# Patient Record
Sex: Male | Born: 1965
Health system: Southern US, Community
[De-identification: ages and names within clinical notes are randomized; demographics above are authoritative.]

## PROBLEM LIST (undated history)

## (undated) DIAGNOSIS — I509 Heart failure, unspecified: Secondary | ICD-10-CM

## (undated) DIAGNOSIS — G4730 Sleep apnea, unspecified: Secondary | ICD-10-CM

## (undated) DIAGNOSIS — Z9911 Dependence on respirator [ventilator] status: Secondary | ICD-10-CM

## (undated) DIAGNOSIS — E119 Type 2 diabetes mellitus without complications: Secondary | ICD-10-CM

## (undated) DIAGNOSIS — J449 Chronic obstructive pulmonary disease, unspecified: Secondary | ICD-10-CM

## (undated) DIAGNOSIS — I10 Essential (primary) hypertension: Secondary | ICD-10-CM

## (undated) DIAGNOSIS — L98429 Non-pressure chronic ulcer of back with unspecified severity: Secondary | ICD-10-CM

## (undated) HISTORY — DX: Type 2 diabetes mellitus without complications: E11.9

## (undated) HISTORY — DX: Chronic obstructive pulmonary disease, unspecified: J44.9

## (undated) HISTORY — PX: TRACHEOSTOMY TUBE PLACEMENT: SHX814

## (undated) HISTORY — DX: Non-pressure chronic ulcer of back with unspecified severity: L98.429

---

## 2004-03-29 ENCOUNTER — Emergency Department (HOSPITAL_COMMUNITY): Admission: EM | Admit: 2004-03-29 | Discharge: 2004-03-29 | Payer: Self-pay | Admitting: Emergency Medicine

## 2004-06-22 ENCOUNTER — Inpatient Hospital Stay (HOSPITAL_COMMUNITY): Admission: RE | Admit: 2004-06-22 | Discharge: 2004-06-27 | Payer: Self-pay | Admitting: Psychiatry

## 2004-06-22 ENCOUNTER — Ambulatory Visit: Payer: Self-pay | Admitting: Psychiatry

## 2005-02-08 ENCOUNTER — Emergency Department (HOSPITAL_COMMUNITY): Admission: EM | Admit: 2005-02-08 | Discharge: 2005-02-08 | Payer: Self-pay | Admitting: Emergency Medicine

## 2006-05-13 ENCOUNTER — Emergency Department (HOSPITAL_COMMUNITY): Admission: EM | Admit: 2006-05-13 | Discharge: 2006-05-14 | Payer: Self-pay | Admitting: Emergency Medicine

## 2006-08-29 ENCOUNTER — Emergency Department (HOSPITAL_COMMUNITY): Admission: EM | Admit: 2006-08-29 | Discharge: 2006-08-29 | Payer: Self-pay | Admitting: Emergency Medicine

## 2006-09-17 ENCOUNTER — Emergency Department (HOSPITAL_COMMUNITY): Admission: EM | Admit: 2006-09-17 | Discharge: 2006-09-17 | Payer: Self-pay | Admitting: Emergency Medicine

## 2006-12-06 ENCOUNTER — Emergency Department (HOSPITAL_COMMUNITY): Admission: EM | Admit: 2006-12-06 | Discharge: 2006-12-06 | Payer: Self-pay | Admitting: Emergency Medicine

## 2006-12-09 ENCOUNTER — Emergency Department (HOSPITAL_COMMUNITY): Admission: EM | Admit: 2006-12-09 | Discharge: 2006-12-09 | Payer: Self-pay | Admitting: Emergency Medicine

## 2007-01-02 ENCOUNTER — Emergency Department (HOSPITAL_COMMUNITY): Admission: EM | Admit: 2007-01-02 | Discharge: 2007-01-02 | Payer: Self-pay | Admitting: Emergency Medicine

## 2007-08-11 ENCOUNTER — Ambulatory Visit: Payer: Self-pay | Admitting: Cardiovascular Disease

## 2007-08-11 ENCOUNTER — Inpatient Hospital Stay (HOSPITAL_COMMUNITY): Admission: EM | Admit: 2007-08-11 | Discharge: 2007-08-15 | Payer: Self-pay | Admitting: Emergency Medicine

## 2007-08-12 ENCOUNTER — Encounter (INDEPENDENT_AMBULATORY_CARE_PROVIDER_SITE_OTHER): Payer: Self-pay | Admitting: Internal Medicine

## 2007-08-13 ENCOUNTER — Ambulatory Visit: Payer: Self-pay | Admitting: Surgery

## 2007-08-13 ENCOUNTER — Encounter (INDEPENDENT_AMBULATORY_CARE_PROVIDER_SITE_OTHER): Payer: Self-pay | Admitting: Internal Medicine

## 2007-09-19 ENCOUNTER — Inpatient Hospital Stay (HOSPITAL_COMMUNITY): Admission: EM | Admit: 2007-09-19 | Discharge: 2007-09-21 | Payer: Self-pay | Admitting: Emergency Medicine

## 2007-09-30 ENCOUNTER — Ambulatory Visit: Payer: Self-pay | Admitting: Internal Medicine

## 2007-10-19 ENCOUNTER — Ambulatory Visit: Payer: Self-pay | Admitting: Internal Medicine

## 2007-11-09 ENCOUNTER — Emergency Department (HOSPITAL_COMMUNITY): Admission: EM | Admit: 2007-11-09 | Discharge: 2007-11-09 | Payer: Self-pay | Admitting: Emergency Medicine

## 2007-12-01 ENCOUNTER — Emergency Department (HOSPITAL_COMMUNITY): Admission: EM | Admit: 2007-12-01 | Discharge: 2007-12-01 | Payer: Self-pay | Admitting: Emergency Medicine

## 2008-01-28 ENCOUNTER — Emergency Department (HOSPITAL_COMMUNITY): Admission: EM | Admit: 2008-01-28 | Discharge: 2008-01-28 | Payer: Self-pay | Admitting: Emergency Medicine

## 2008-07-18 ENCOUNTER — Emergency Department (HOSPITAL_COMMUNITY): Admission: EM | Admit: 2008-07-18 | Discharge: 2008-07-18 | Payer: Self-pay | Admitting: Emergency Medicine

## 2008-08-03 ENCOUNTER — Emergency Department (HOSPITAL_COMMUNITY): Admission: EM | Admit: 2008-08-03 | Discharge: 2008-08-03 | Payer: Self-pay | Admitting: Emergency Medicine

## 2008-11-17 ENCOUNTER — Emergency Department (HOSPITAL_COMMUNITY): Admission: EM | Admit: 2008-11-17 | Discharge: 2008-11-17 | Payer: Self-pay | Admitting: Family Medicine

## 2009-12-22 ENCOUNTER — Emergency Department (HOSPITAL_COMMUNITY): Admission: EM | Admit: 2009-12-22 | Discharge: 2009-12-22 | Payer: Self-pay | Admitting: Emergency Medicine

## 2010-01-08 ENCOUNTER — Inpatient Hospital Stay (HOSPITAL_COMMUNITY)
Admission: EM | Admit: 2010-01-08 | Discharge: 2010-01-12 | Payer: Self-pay | Source: Home / Self Care | Attending: Internal Medicine | Admitting: Internal Medicine

## 2010-01-08 ENCOUNTER — Emergency Department (HOSPITAL_COMMUNITY)
Admission: EM | Admit: 2010-01-08 | Discharge: 2010-01-08 | Disposition: A | Discharge status: Other Acute Inpatient Hospital | Payer: Self-pay | Source: Home / Self Care | Admitting: Emergency Medicine

## 2010-01-09 ENCOUNTER — Encounter (INDEPENDENT_AMBULATORY_CARE_PROVIDER_SITE_OTHER): Payer: Self-pay | Admitting: Internal Medicine

## 2010-01-22 ENCOUNTER — Ambulatory Visit (HOSPITAL_BASED_OUTPATIENT_CLINIC_OR_DEPARTMENT_OTHER)
Admission: RE | Admit: 2010-01-22 | Discharge: 2010-01-22 | Payer: Self-pay | Source: Home / Self Care | Attending: Internal Medicine | Admitting: Internal Medicine

## 2010-01-29 ENCOUNTER — Emergency Department (HOSPITAL_COMMUNITY)
Admission: EM | Admit: 2010-01-29 | Discharge: 2010-01-30 | Payer: Self-pay | Source: Home / Self Care | Admitting: Emergency Medicine

## 2010-02-02 ENCOUNTER — Observation Stay (HOSPITAL_COMMUNITY)
Admission: EM | Admit: 2010-02-02 | Discharge: 2010-02-05 | Payer: Self-pay | Source: Home / Self Care | Attending: Internal Medicine | Admitting: Internal Medicine

## 2010-02-04 DIAGNOSIS — L98429 Non-pressure chronic ulcer of back with unspecified severity: Secondary | ICD-10-CM

## 2010-02-04 HISTORY — DX: Non-pressure chronic ulcer of back with unspecified severity: L98.429

## 2010-02-28 NOTE — Discharge Summary (Addendum)
[name], [name] NO.:  [id number]  MEDICAL RECORD NO.:  [id number]          PATIENT TYPE:  OBV  LOCATION:  5511                         FACILITY:  [facility]  PHYSICIAN:  Rock Nephew, MD       DATE OF BIRTH:  [birth year]  DATE OF ADMISSION:  02/02/2010 DATE OF DISCHARGE:                        DISCHARGE SUMMARY - REFERRING   PATIENT PRIMARY CARE PHYSICIAN:  He does not have one.  The patient should establish care with [facility] or establish a primary care physician.  DISCHARGE DIAGNOSES:     Shortness of breath, multifactorial possible chronic obstructive     pulmonary disease, sleep apnea, obesity, hypoventilation syndrome,     mild volume overload.     Sleep apnea/obesity, hypoventilation syndrome on C Pap, morbid     obesity, counselled, leukocytosis secondary to steroids, tobacco     abuse counseled, obesity, counseled.  DISCHARGE MEDICATIONS:  Discharge medications for the patient are as follows: 1. Combivent 2 puffs inhaled as directed. 2. Avelox 400 mg p.o. daily for 8 days. 3. Prednisone 1 taper 60 mg for 2 days, 50 mg for 2 days, 40 mg for 2     days, 30 mg for 2 days and 20 mg for 2 days, 10 mg for 2 days. 4. Ibuprofen 4 tablets q.4h. as needed. 5. Lasix 20 mg p.o. daily.  DISCHARGE INSTRUCTIONS:  The patient's diet should be heart-healthy.  PROCEDURES PERFORMED:  The patient had a chest x-ray which showed prominent lung markings that appeared chronic.  Cannot exclude mild edema or vascular congestion.  CONSULTATIONS ON THIS CASE:  None.  FOLLOWUP:  The patient should follow-up at [facility] or with a PCP within 1 week.  The patient should follow up with Dr. Joni Fears D. Young FCCP pulmonologist in 1-2 weeks.  HISTORY AND PHYSICAL:  Chief complaint shortness of breath.  The patient is a 45 year old male, homeless, history of significant obesity, sleep apnea/obesity, hypoventilation syndrome, smoker.  He came to the hospital for  shortness of breath.  He reports that he has had shortness of breast for the last 2 days and it is getting worse.  The patient also does not have any inhalers at home because he is homeless.  HOSPITAL COURSE: 1. Shortness of breath.  The patient had some shortness of breath.     The shortness of breath was multifactorial, combination of obesity     hypoventilation syndrome, sleep apnea, and possible COPD.  The     patient was given some IV Lasix.  The patient was given inhalers     and some steroids.  The patient improved.  The patient was deemed     ready for discharge on February 05, 2010. 2. Sleep apnea and obesity hypoventilation syndrome.  The patient was     counseled to lose weight.  The patient uses a C Pap at home.  He     has a C Pap that he takes to the shelter with him. 3. Morbid obesity.  The patient has a history of morbid obesity and     the patient was counseled on weight loss.  4. Leukocytosis.  The patient was given steroids during admission and     the patient developed some leukocytosis that has resolved.  The     patient also is being empirically treated with Avelox. 5. Tobacco abuse.  The patient was counseled.  We will also obtain     case management and social work consults for this patient in hopes     of easing his transition out of the hospital.     Rock Nephew, MD     NH/MEDQ  D:  02/05/2010  T:  02/05/2010  Job:  [id number]  Electronically Signed by Rock Nephew MD on 02/28/2010 06:15:18 PM

## 2010-03-04 ENCOUNTER — Inpatient Hospital Stay (HOSPITAL_COMMUNITY)
Admission: EM | Admit: 2010-03-04 | Discharge: 2010-03-16 | DRG: 291 | Disposition: A | Discharge status: Skilled Nursing Facility | Payer: Medicaid Other | Attending: Internal Medicine | Admitting: Internal Medicine

## 2010-03-04 DIAGNOSIS — E872 Acidosis, unspecified: Secondary | ICD-10-CM | POA: Diagnosis present

## 2010-03-04 DIAGNOSIS — Z6841 Body Mass Index (BMI) 40.0 and over, adult: Secondary | ICD-10-CM

## 2010-03-04 DIAGNOSIS — E785 Hyperlipidemia, unspecified: Secondary | ICD-10-CM | POA: Diagnosis present

## 2010-03-04 DIAGNOSIS — Z5900 Homelessness unspecified: Secondary | ICD-10-CM

## 2010-03-04 DIAGNOSIS — M549 Dorsalgia, unspecified: Secondary | ICD-10-CM | POA: Diagnosis present

## 2010-03-04 DIAGNOSIS — F17200 Nicotine dependence, unspecified, uncomplicated: Secondary | ICD-10-CM | POA: Diagnosis present

## 2010-03-04 DIAGNOSIS — D72829 Elevated white blood cell count, unspecified: Secondary | ICD-10-CM | POA: Diagnosis present

## 2010-03-04 DIAGNOSIS — J9600 Acute respiratory failure, unspecified whether with hypoxia or hypercapnia: Secondary | ICD-10-CM | POA: Diagnosis present

## 2010-03-04 DIAGNOSIS — G4733 Obstructive sleep apnea (adult) (pediatric): Secondary | ICD-10-CM | POA: Diagnosis present

## 2010-03-04 DIAGNOSIS — I5033 Acute on chronic diastolic (congestive) heart failure: Principal | ICD-10-CM | POA: Diagnosis present

## 2010-03-04 DIAGNOSIS — I10 Essential (primary) hypertension: Secondary | ICD-10-CM | POA: Diagnosis present

## 2010-03-04 LAB — DIFFERENTIAL
Basophils Absolute: 0 10*3/uL (ref 0.0–0.1)
Lymphocytes Relative: 20 % (ref 12–46)
Lymphs Abs: 3.7 10*3/uL (ref 0.7–4.0)
Monocytes Absolute: 1.1 10*3/uL — ABNORMAL HIGH (ref 0.1–1.0)
Monocytes Relative: 6 % (ref 3–12)
Neutro Abs: 13.8 10*3/uL — ABNORMAL HIGH (ref 1.7–7.7)

## 2010-03-04 LAB — CBC
HCT: 37.3 % — ABNORMAL LOW (ref 39.0–52.0)
Hemoglobin: 13 g/dL (ref 13.0–17.0)
MCH: 26.4 pg (ref 26.0–34.0)
MCHC: 34.9 g/dL (ref 30.0–36.0)
MCV: 75.8 fL — ABNORMAL LOW (ref 78.0–100.0)

## 2010-03-05 LAB — BASIC METABOLIC PANEL
CO2: 30 mEq/L (ref 19–32)
Calcium: 9 mg/dL (ref 8.4–10.5)
Glucose, Bld: 164 mg/dL — ABNORMAL HIGH (ref 70–99)
Sodium: 140 mEq/L (ref 135–145)

## 2010-03-05 LAB — POCT I-STAT 3, ART BLOOD GAS (G3+)
O2 Saturation: 97 %
TCO2: 37 mmol/L (ref 0–100)
pCO2 arterial: 66.9 mmHg (ref 35.0–45.0)
pO2, Arterial: 104 mmHg — ABNORMAL HIGH (ref 80.0–100.0)

## 2010-03-05 LAB — CK TOTAL AND CKMB (NOT AT ARMC)
CK, MB: 2.5 ng/mL (ref 0.3–4.0)
Relative Index: 0.7 (ref 0.0–2.5)
Total CK: 359 U/L — ABNORMAL HIGH (ref 7–232)

## 2010-03-05 LAB — TROPONIN I: Troponin I: <0.01 ng/mL (ref 0.00–0.06)

## 2010-03-05 LAB — CARDIAC PANEL(CRET KIN+CKTOT+MB+TROPI)
CK, MB: 2.4 ng/mL (ref 0.3–4.0)
Relative Index: 1.2 (ref 0.0–2.5)
Troponin I: <0.01 ng/mL (ref 0.00–0.06)
Troponin I: 0.01 ng/mL (ref 0.00–0.06)

## 2010-03-05 LAB — BLOOD GAS, ARTERIAL
Drawn by: 23337
O2 Content: 6 L/min
pCO2 arterial: 65.5 mmHg (ref 35.0–45.0)
pO2, Arterial: 89.7 mmHg (ref 80.0–100.0)

## 2010-03-05 LAB — POCT I-STAT, CHEM 8
Chloride: 99 mEq/L (ref 96–112)
Creatinine, Ser: 1.1 mg/dL (ref 0.4–1.5)
Glucose, Bld: 197 mg/dL — ABNORMAL HIGH (ref 70–99)
Hemoglobin: 15 g/dL (ref 13.0–17.0)
Potassium: 4.4 mEq/L (ref 3.5–5.1)

## 2010-03-05 LAB — TSH: TSH: 0.802 u[IU]/mL (ref 0.350–4.500)

## 2010-03-05 LAB — LIPID PANEL: Cholesterol: 183 mg/dL (ref 0–200)

## 2010-03-06 LAB — CBC
HCT: 36.9 % — ABNORMAL LOW (ref 39.0–52.0)
Hemoglobin: 12.1 g/dL — ABNORMAL LOW (ref 13.0–17.0)
RDW: 15.4 % (ref 11.5–15.5)
WBC: 17.4 10*3/uL — ABNORMAL HIGH (ref 4.0–10.5)

## 2010-03-06 LAB — BASIC METABOLIC PANEL
GFR calc Af Amer: >60 mL/min (ref >60)
GFR calc non Af Amer: >60 mL/min (ref >60)
Glucose, Bld: 127 mg/dL — ABNORMAL HIGH (ref 70–99)
Potassium: 4.2 mEq/L (ref 3.5–5.1)
Sodium: 137 mEq/L (ref 135–145)

## 2010-03-06 LAB — BRAIN NATRIURETIC PEPTIDE: Pro B Natriuretic peptide (BNP): <30 pg/mL (ref 0.0–100.0)

## 2010-03-07 ENCOUNTER — Inpatient Hospital Stay (HOSPITAL_COMMUNITY): Payer: Medicaid Other

## 2010-03-07 LAB — RENAL FUNCTION PANEL
Albumin: 3.2 g/dL — ABNORMAL LOW (ref 3.5–5.2)
CO2: 39 mEq/L — ABNORMAL HIGH (ref 19–32)
Chloride: 92 mEq/L — ABNORMAL LOW (ref 96–112)
GFR calc Af Amer: >60 mL/min (ref >60)
GFR calc non Af Amer: >60 mL/min (ref >60)
Potassium: 4.1 mEq/L (ref 3.5–5.1)
Sodium: 139 mEq/L (ref 135–145)

## 2010-03-07 LAB — CBC
Hemoglobin: 13.4 g/dL (ref 13.0–17.0)
MCH: 26.1 pg (ref 26.0–34.0)
Platelets: 283 10*3/uL (ref 150–400)
RBC: 5.13 MIL/uL (ref 4.22–5.81)
WBC: 16.7 10*3/uL — ABNORMAL HIGH (ref 4.0–10.5)

## 2010-03-08 LAB — CBC
HCT: 38.9 % — ABNORMAL LOW (ref 39.0–52.0)
Hemoglobin: 13.4 g/dL (ref 13.0–17.0)
RBC: 5.07 MIL/uL (ref 4.22–5.81)
WBC: 17.2 10*3/uL — ABNORMAL HIGH (ref 4.0–10.5)

## 2010-03-08 LAB — BASIC METABOLIC PANEL
CO2: 34 mEq/L — ABNORMAL HIGH (ref 19–32)
Calcium: 9 mg/dL (ref 8.4–10.5)
GFR calc Af Amer: >60 mL/min (ref >60)
Glucose, Bld: 124 mg/dL — ABNORMAL HIGH (ref 70–99)
Potassium: 3.5 mEq/L (ref 3.5–5.1)
Sodium: 136 mEq/L (ref 135–145)

## 2010-03-09 LAB — BASIC METABOLIC PANEL
BUN: 13 mg/dL (ref 6–23)
GFR calc non Af Amer: >60 mL/min (ref >60)
Glucose, Bld: 149 mg/dL — ABNORMAL HIGH (ref 70–99)
Potassium: 4.1 mEq/L (ref 3.5–5.1)

## 2010-03-09 LAB — CBC
HCT: 40.7 % (ref 39.0–52.0)
Hemoglobin: 14.1 g/dL (ref 13.0–17.0)
MCH: 26.4 pg (ref 26.0–34.0)
MCHC: 34.6 g/dL (ref 30.0–36.0)
RBC: 5.34 MIL/uL (ref 4.22–5.81)

## 2010-03-10 LAB — CBC
HCT: 39.3 % (ref 39.0–52.0)
MCHC: 34.6 g/dL (ref 30.0–36.0)
RDW: 15 % (ref 11.5–15.5)

## 2010-03-10 LAB — BASIC METABOLIC PANEL
BUN: 14 mg/dL (ref 6–23)
Calcium: 9.2 mg/dL (ref 8.4–10.5)
GFR calc non Af Amer: >60 mL/min (ref >60)
Glucose, Bld: 126 mg/dL — ABNORMAL HIGH (ref 70–99)
Sodium: 135 mEq/L (ref 135–145)

## 2010-03-11 LAB — BASIC METABOLIC PANEL
Calcium: 9.4 mg/dL (ref 8.4–10.5)
GFR calc Af Amer: >60 mL/min (ref >60)
GFR calc non Af Amer: >60 mL/min (ref >60)
Sodium: 136 mEq/L (ref 135–145)

## 2010-03-11 LAB — CBC
Hemoglobin: 14.6 g/dL (ref 13.0–17.0)
MCHC: 35.2 g/dL (ref 30.0–36.0)
Platelets: 300 10*3/uL (ref 150–400)
RDW: 15.1 % (ref 11.5–15.5)

## 2010-03-12 LAB — BASIC METABOLIC PANEL
GFR calc non Af Amer: >60 mL/min (ref >60)
Potassium: 4.2 mEq/L (ref 3.5–5.1)
Sodium: 136 mEq/L (ref 135–145)

## 2010-03-13 LAB — CBC
HCT: 38.4 % — ABNORMAL LOW (ref 39.0–52.0)
Hemoglobin: 13.8 g/dL (ref 13.0–17.0)
RDW: 14.9 % (ref 11.5–15.5)
WBC: 16.4 10*3/uL — ABNORMAL HIGH (ref 4.0–10.5)

## 2010-03-14 ENCOUNTER — Inpatient Hospital Stay (HOSPITAL_COMMUNITY): Payer: Medicaid Other

## 2010-03-14 LAB — BASIC METABOLIC PANEL
CO2: 34 mEq/L — ABNORMAL HIGH (ref 19–32)
GFR calc Af Amer: >60 mL/min (ref >60)
GFR calc non Af Amer: >60 mL/min (ref >60)
Glucose, Bld: 167 mg/dL — ABNORMAL HIGH (ref 70–99)
Potassium: 4.1 mEq/L (ref 3.5–5.1)
Sodium: 138 mEq/L (ref 135–145)

## 2010-03-14 LAB — PHOSPHORUS: Phosphorus: 3.7 mg/dL (ref 2.3–4.6)

## 2010-03-16 LAB — COMPREHENSIVE METABOLIC PANEL
ALT: 33 U/L (ref 0–53)
AST: 22 U/L (ref 0–37)
Albumin: 3.3 g/dL — ABNORMAL LOW (ref 3.5–5.2)
Alkaline Phosphatase: 58 U/L (ref 39–117)
CO2: 31 mEq/L (ref 19–32)
Chloride: 98 mEq/L (ref 96–112)
Creatinine, Ser: 0.8 mg/dL (ref 0.4–1.5)
GFR calc Af Amer: >60 mL/min (ref >60)
GFR calc non Af Amer: >60 mL/min (ref >60)
Potassium: 4.2 mEq/L (ref 3.5–5.1)
Sodium: 137 mEq/L (ref 135–145)
Total Bilirubin: 0.4 mg/dL (ref 0.3–1.2)

## 2010-03-16 LAB — CBC
Hemoglobin: 13.5 g/dL (ref 13.0–17.0)
MCH: 26.1 pg (ref 26.0–34.0)
Platelets: 295 10*3/uL (ref 150–400)
RBC: 5.18 MIL/uL (ref 4.22–5.81)
WBC: 14.9 10*3/uL — ABNORMAL HIGH (ref 4.0–10.5)

## 2010-03-16 LAB — DIFFERENTIAL
Basophils Relative: 0 % (ref 0–1)
Monocytes Relative: 10 % (ref 3–12)
Neutro Abs: 8.1 10*3/uL — ABNORMAL HIGH (ref 1.7–7.7)
Neutrophils Relative %: 54 % (ref 43–77)

## 2010-03-17 NOTE — Discharge Summary (Signed)
[name] NO.:  [id number]  MEDICAL RECORD NO.:  [id number]           PATIENT TYPE:  LOCATION:                                 FACILITY:  PHYSICIAN:  [name] Nephew, MD       DATE OF BIRTH:  1965-09-11  DATE OF ADMISSION:  03/05/2010 DATE OF DISCHARGE:  03/16/2010                              DISCHARGE SUMMARY   PRIMARY CARE PHYSICIAN:  The patient does not have a primary care physician.  DISCHARGE DIAGNOSES: 1. Respiratory distress. 2. Dyspnea, multifactorial. 3. Chronic obstructive pulmonary disease. 4. Obstructive sleep apnea. 5. Possible obesity hypoventilation syndrome. 6. Treated hypertension. 7. Chronic leukocytosis. 8. Severe chronic obstructive pulmonary disease for PFTs. 9. Morbid obesity. 10.History of hypertension. 11.History of dyslipidemia. 12.Chronic back pain. 13.History of diastolic congestive heart failure.  DISCHARGE MEDICATIONS: 1. Albuterol 2.5 mg inhaled every 6 hours as needed. 2. Amlodipine 5 mg p.o. daily. 3. Colace 100 mg by mouth twice daily. 4. Advair 250/50 1 one puff inhaled twice daily. 5. Furosemide 40 mg p.o. daily. 6. Lisinopril 10 mg p.o. daily. 7. Oxycodone 5 mg by mouth every 4 hours as needed. 8. Senna 2 tablets by mouth daily as needed. 9. Simvastatin 20 mg p.o. daily. 10.Spiriva 18 mcg inhaled daily.  DISPOSITION:  SNF.  PROCEDURES PERFORMED:  The patient had PFTs on February 05, 2010, which showed severe COPD.  The patient also had chest x-ray last performed on March 14, 2010, which showed chronic cardiomegaly, pulmonary vascularities within normal limits.  No infiltrates or effusions.  No acute osseous abnormality.  CONSULTATIONS ON THIS CASE:  None.  DIET:  It should be heart healthy, low fat, low calorie.  FOLLOWUP:  The patient should follow up with SNF PCP within 3-4 days. The patient should also follow up with either Dr. Jetty Duhamel or a pulmonologist within 2-3 weeks.  BRIEF  HISTORY OF PRESENT ILLNESS:  This is a 45 year old male with morbid obesity, multiple medical problems, and homeless who presented to the emergency department complaining of shortness of breath over the last 24 hours prior to admission.  The patient had several recent hospitalizations due to acute diastolic CHF.  He was seen in the emergency department and was found to have labored breathing and tachypneic.  Chest x-ray revealed vascular congestion and interstitial pulmonary edema consistent with CHF.  The patient received 40 mg of IV Lasix and after diuresis with 3 liters his shortness of breath improved significantly.  HOSPITAL COURSE: 1. Dyspnea:  Dyspnea was thought to be related to multifactorial,     pulmonary edema, COPD, sleep apnea, and obesity hypoventilation.     The patient improved with nebulizations as well as Lasix.  The     patient improved greatly.  The patient is saturating well on room     air.  The patient's last chest x-ray did not show any pulmonary     vascularity.  Currently, the patient has had a long hospital    course, awaiting SNF placement.  The patient has SNF bed found and     the patient will be discharged  on March 16, 2010. 2. Hypertension:  The patient's blood pressure medications have been     adjusted.  The patient is on multiple antihypertensives and blood     pressure currently is 115/74 on March 16, 2010. 3. Leukocytosis:  The patient has chronic leukocytosis, etiology is     not clear. 4. Severe COPD:  The patient had pulmonary function test which showed     severe COPD.  The patient is placed on Advair, albuterol, and also     Spiriva.  The patient should have an outpatient evaluation with     pulmonologist. 5. Obstructive sleep apnea:  The patient has obstructive sleep apnea     and the patient has been having CPAP at night.  The patient is     homeless and uses a CPAP also. 6. DVT prophylaxis:  The patient received Lovenox for DVT  prophylaxis.     The patient has other history of dyslipidemia and chronic back     pain.  The patient has received antihypertensives. 7. Dyslipidemia:  The patient received simvastatin.     [name] Nephew, MD     NH/MEDQ  D:  03/16/2010  T:  03/16/2010  Job:  [id number]  cc:   Joni Fears D. Maple Hudson, MD, [facility], FACP  Electronically Signed by [name] Nephew MD on 03/17/2010 04:50:34 PM

## 2010-03-30 NOTE — Discharge Summary (Signed)
NAMENIEKO, CLARIN NO.:  [id number]  MEDICAL RECORD NO.:  [id number]           PATIENT TYPE:  LOCATION:                                 FACILITY:  PHYSICIAN:  Hartley Barefoot, MD    DATE OF BIRTH:  [birth year]  DATE OF ADMISSION:  03/05/2010 DATE OF DISCHARGE:                              DISCHARGE SUMMARY   Discharge date to be determined.  Discharge medications to be determined.  ADMISSION DIAGNOSES: 1. Respiratory distress thought to be multifactorial secondary to     pulmonary edema, chronic obstructive pulmonary disease, obstructive     sleep apnea, and morbid obesity. 2. Hypertension. 3. Chronic leukocytosis. 4. Severe chronic obstructive pulmonary disease per pulmonary function     tests. 5. Obstructive sleep apnea. 6. Morbid obesity. 7. Homeless.  OTHER PAST MEDICAL HISTORY: 1. Hypertension. 2. Dyslipidemia. 3. Chronic back pain. 4. History of diastolic congestive heart failure. 5. Frequent hospitalization and ED visit due to shortness of breath.  CURRENT MEDICATIONS: 1. Lovenox 80 subcu daily. 2. Advair one puff twice a day 3. Lasix 40 mg b.i.d. 4. Lisinopril 10 mg p.o. daily. 5. Zocor 20 mg p.o. daily. 6. Docusate 100 mg p.o. b.i.d.  STUDIES PERFORMED: 1. Chest x-ray on March 05, 2010, show unchanged cardiomegaly and     pulmonary vascular congestion with interstitial pulmonary edema     consistent with mild CHF.  Chest x-ray on March 06, 2010, show a     stable to minimally worse CHF with increasing pulmonary vascular     congestion compared to x-ray performed on March 05, 2010. 2. Pulmonary function test performed on March 07, 2010, show severe     obstructive airways disease.  BRIEF HISTORY OF PRESENT ILLNESS:  This is a 45 year old with morbid obesity, multiple medical problems, homeless, who presented to the emergency department complaining of shortness of breath over the last 24 hours prior to admission.  The  patient has had several recent hospitalization due to acute CHF.  He was seen in the emergency department and found to have labored breathing and tachypneic.  Chest x- ray revealed vascular congestion and interstitial pulmonary edema consistent with CHF.  The patient received 40 mg of IV Lasix and after diuresis of 3 liters, his shortness of breath improved significantly.  HOSPITAL COURSE.: 1. Respiratory distress, this was thought to be multifactorial     secondary to pulmonary edema, secondary to diastolic heart failure,     component of severe COPD and obstructive sleep apnea.  During     hospitalization, dyspnea has improved with IV Lasix initially then     transitioned to p.o.  The patient was also started on albuterol and     nebulizer treatment. 2. Hypertension, now well controlled.  We will continue the patient on     Lasix, lisinopril. 3. Chronic leukocytosis, initially, this was thought to be to a prior     history of frequent steroid use.  The patient has been off steroid     at this time.  His white blood cell has been in the 16 range.  He  might need a follow up as an outpatient with a hematologist for     further workup.  The patient has remained afebrile.  Chest x-ray     negative for pneumonia. 4..  Severe COPD.  The patient had a pulmonary function test that showed severe COPD.  He was started on Advair.  We will continue with albuterol.  The patient has improved. 1. Obstructive sleep apnea.  Continue with CPAP.  DISPOSITION:  The patient at this time is waiting a skilled nursing facility placement.  He is a pleasant 45 year old morbid obesity, able to ambulate with some difficulty.  He is homeless and he has had frequent hospitalizations.  He will need a skilled nursing facility to help to control his chronic medical problems.     Hartley Barefoot, MD   ______________________________ Hartley Barefoot, MD    BR/MEDQ  D:  03/13/2010  T:  03/14/2010  Job:   [id number]  Electronically Signed by Hartley Barefoot MD on 03/30/2010 03:03:41 PM

## 2010-04-09 NOTE — H&P (Signed)
[name], [name] NO.:  [id number]  MEDICAL RECORD NO.:  [id number]          PATIENT TYPE:  INP  LOCATION:  2610                         FACILITY:  [facility]  PHYSICIAN:  Della Goo, M.D. DATE OF BIRTH:  [birth year]  DATE OF ADMISSION:  03/04/2010 DATE OF DISCHARGE:                             HISTORY & PHYSICAL   PRIMARY CARE PROVIDER:  [facility].  CHIEF COMPLAINT:  Respiratory distress.  HISTORY OF PRESENT ILLNESS:  This is a 45 year old male who has multiple medical problems including morbid obesity, sleep apnea and congestive heart failure syndrome who presents to the emergency department secondary to worsening shortness of breath over the past 24 hours.  The patient reports being short of breath for many weeks and he has had several recent hospitalizations secondary to acute CHF.  He was seen in the emergency department found to have labored breathing and tachypneic. The patient had a chest x-ray performed, which revealed vascular congestion and interstitial pulmonary edema consistent with CHF.  The patient was given IV Lasix 40 mg x1 and began to have improvement. Gradually, he began to diurese and diuresed 3000 mL.  The patient was referred for medical admission.  PAST MEDICAL HISTORY:  As mentioned above; morbid obesity, diastolic congestive heart failure, hypertension, dyslipidemia, chronic back pain, obstructive sleep apnea,  PAST SURGICAL HISTORY:  None.  Medications will need to be further verified.  He is on Combivent inhaler and Lasix and some other medications.  He is also on BiPAP at bedtime.  The pressures are 25/90.  ALLERGIES:  TRAMADOL, which causes angioedema.  SOCIAL HISTORY:  The patient lives in a homeless shelter.  He is a smoker.  He reports that he has cut down.  He is down to 3 cigarettes daily.  He also reports occasional alcohol usage.  He also reports marijuana usage on  holidays.  FAMILY HISTORY:  Noncontributory.  REVIEW OF SYSTEMS:  Pertinent as mentioned above.  PHYSICAL EXAMINATION FINDINGS:  GENERAL:  This is a 45 year old extremely morbidly obese, African American male in acute distress. VITAL SIGNS: Temperature was unable to be taken secondary to the oxygen mask.  Blood pressure 112/49 to 162/82.  Heart rate 110 now 96 and O2 saturation 95-98%. HEENT:  Normocephalic, atraumatic.  Pupils equally round reactive to light.  Extraocular movements are intact.  Funduscopic benign.  There is no scleral icterus.  Nares are patent bilaterally.  Oropharynx clear. The facial mask oxygen currently covering the face and nose.  The oral mucosa is dry. NECK:  Supple with no range of motion.  The patient does have jugular venous distention.  CARDIOVASCULAR:  Regular rate and rhythm.  No murmurs, gallops or rubs appreciated. LUNGS:  Decreased breath sounds bilaterally.  No rales, rhonchi or wheezes appreciated. ABDOMEN:  Positive bowel sounds, obese, soft, nontender, nondistended, unable to palpate hepatosplenomegaly. EXTREMITIES:  2+ edema. NEUROLOGIC:  __________ The patient is able to move all 4 of his extremity but does have limitations upper extremity secondary to severe obesity.  LABORATORY STUDIES:  White blood cell count 18.9, hemoglobin 13.0, hematocrit 37.3.  MCV  75.8, platelets 266, neutrophils 73%, lymphocytes 30%.  Sodium 140, potassium 4.1, chloride 100, CO2 of 30, BUN 10, creatinine 0.86 and glucose 164.  Beta-natriuretic peptide less than 30.0.  D-dimer 0.27.  Chest x-ray as mentioned above.  EKG reveals a normal sinus rhythm and right ventricular hypertrophic changes are seen. No acute ST-segment changes are seen.  ASSESSMENT:  This is a 45 year old male being admitted with: 1. Respiratory distress. 2. Acute diastolic congestive heart failure syndrome. 3. Hypertension. 4. Obstructive sleep apnea. 5. Morbid obesity.  PLAN:  The  patient will be admitted to the step-down ICU area.  He will continue on supplemental oxygen as needed.  An arterial blood gas had been performed, results of which were reviewed.  The oxygen will be decreased downward.  IV Lasix will be administered q.12 h. x48 hours with potassium supplementation and the patient will be placed on the CHF protocol.  BiPAP has been ordered at bedtime and p.r.n.  The patient is on BiPAP at bedtime.  Cardiac enzymes will be performed and a 2-D echo will be performed.  The patient has not had one recently.  The patient will be started on ACE inhibitor therapy and carvedilol therapy.  He will also be transitioned to a maintenance Lasix dosage.  DVT prophylaxis has been ordered and the patient is a full code.     Della Goo, M.D.     HJ/MEDQ  D:  03/05/2010  T:  03/05/2010  Job:  [id number]  Electronically Signed by Della Goo M.D. on 04/09/2010 08:11:55 PM

## 2010-04-16 LAB — URINALYSIS, ROUTINE W REFLEX MICROSCOPIC
Bilirubin Urine: NEGATIVE
Nitrite: NEGATIVE
Specific Gravity, Urine: 1.016 (ref 1.005–1.030)
Urobilinogen, UA: 0.2 mg/dL (ref 0.0–1.0)
pH: 5.5 (ref 5.0–8.0)

## 2010-04-16 LAB — DIFFERENTIAL
Basophils Absolute: 0 10*3/uL (ref 0.0–0.1)
Basophils Absolute: 0 10*3/uL (ref 0.0–0.1)
Basophils Relative: 0 % (ref 0–1)
Basophils Relative: 0 % (ref 0–1)
Eosinophils Absolute: 0 10*3/uL (ref 0.0–0.7)
Eosinophils Absolute: 0.4 10*3/uL (ref 0.0–0.7)
Eosinophils Relative: 2 % (ref 0–5)
Lymphocytes Relative: 38 % (ref 12–46)
Lymphs Abs: 5.2 10*3/uL — ABNORMAL HIGH (ref 0.7–4.0)
Monocytes Absolute: 1.5 10*3/uL — ABNORMAL HIGH (ref 0.1–1.0)
Monocytes Relative: 10 % (ref 3–12)
Monocytes Relative: 11 % (ref 3–12)
Monocytes Relative: 2 % — ABNORMAL LOW (ref 3–12)
Neutro Abs: 15.7 10*3/uL — ABNORMAL HIGH (ref 1.7–7.7)
Neutro Abs: 8.9 10*3/uL — ABNORMAL HIGH (ref 1.7–7.7)
Neutrophils Relative %: 49 % (ref 43–77)
Neutrophils Relative %: 54 % (ref 43–77)
Neutrophils Relative %: 87 % — ABNORMAL HIGH (ref 43–77)

## 2010-04-16 LAB — BASIC METABOLIC PANEL
BUN: 11 mg/dL (ref 6–23)
BUN: 15 mg/dL (ref 6–23)
CO2: 30 mEq/L (ref 19–32)
Chloride: 100 mEq/L (ref 96–112)
Chloride: 101 mEq/L (ref 96–112)
Chloride: 99 mEq/L (ref 96–112)
Creatinine, Ser: 0.9 mg/dL (ref 0.4–1.5)
GFR calc Af Amer: >60 mL/min (ref >60)
GFR calc non Af Amer: >60 mL/min (ref >60)
GFR calc non Af Amer: >60 mL/min (ref >60)
Glucose, Bld: 126 mg/dL — ABNORMAL HIGH (ref 70–99)
Potassium: 3.9 mEq/L (ref 3.5–5.1)
Potassium: 3.9 mEq/L (ref 3.5–5.1)
Potassium: 4.3 mEq/L (ref 3.5–5.1)
Sodium: 140 mEq/L (ref 135–145)
Sodium: 140 mEq/L (ref 135–145)

## 2010-04-16 LAB — CBC
HCT: 37.9 % — ABNORMAL LOW (ref 39.0–52.0)
HCT: 39.5 % (ref 39.0–52.0)
HCT: 39.6 % (ref 39.0–52.0)
Hemoglobin: 13.1 g/dL (ref 13.0–17.0)
Hemoglobin: 13.1 g/dL (ref 13.0–17.0)
Hemoglobin: 14.3 g/dL (ref 13.0–17.0)
MCH: 26.7 pg (ref 26.0–34.0)
MCH: 27 pg (ref 26.0–34.0)
MCH: 27.1 pg (ref 26.0–34.0)
MCHC: 34.6 g/dL (ref 30.0–36.0)
MCHC: 34.9 g/dL (ref 30.0–36.0)
MCV: 76.1 fL — ABNORMAL LOW (ref 78.0–100.0)
MCV: 76.3 fL — ABNORMAL LOW (ref 78.0–100.0)
MCV: 76.5 fL — ABNORMAL LOW (ref 78.0–100.0)
MCV: 76.7 fL — ABNORMAL LOW (ref 78.0–100.0)
Platelets: 301 10*3/uL (ref 150–400)
RBC: 4.9 MIL/uL (ref 4.22–5.81)
RBC: 5.18 MIL/uL (ref 4.22–5.81)
RBC: 5.3 MIL/uL (ref 4.22–5.81)
RDW: 15.3 % (ref 11.5–15.5)
RDW: 15.6 % — ABNORMAL HIGH (ref 11.5–15.5)
WBC: 17.9 10*3/uL — ABNORMAL HIGH (ref 4.0–10.5)
WBC: 18.4 10*3/uL — ABNORMAL HIGH (ref 4.0–10.5)
WBC: 18.6 10*3/uL — ABNORMAL HIGH (ref 4.0–10.5)

## 2010-04-16 LAB — RAPID URINE DRUG SCREEN, HOSP PERFORMED
Barbiturates: NOT DETECTED
Opiates: NOT DETECTED
Tetrahydrocannabinol: POSITIVE — AB

## 2010-04-16 LAB — MAGNESIUM
Magnesium: 2 mg/dL (ref 1.5–2.5)
Magnesium: 2.1 mg/dL (ref 1.5–2.5)

## 2010-04-16 LAB — COMPREHENSIVE METABOLIC PANEL
BUN: 12 mg/dL (ref 6–23)
CO2: 34 mEq/L — ABNORMAL HIGH (ref 19–32)
Calcium: 8.9 mg/dL (ref 8.4–10.5)
Chloride: 96 mEq/L (ref 96–112)
Creatinine, Ser: 0.92 mg/dL (ref 0.4–1.5)
GFR calc Af Amer: >60 mL/min (ref >60)
GFR calc non Af Amer: >60 mL/min (ref >60)
Total Bilirubin: 1 mg/dL (ref 0.3–1.2)

## 2010-04-16 LAB — CK TOTAL AND CKMB (NOT AT ARMC)
CK, MB: 1.4 ng/mL (ref 0.3–4.0)
CK, MB: 1.8 ng/mL (ref 0.3–4.0)
Relative Index: 0.6 (ref 0.0–2.5)
Relative Index: 0.6 (ref 0.0–2.5)
Total CK: 237 U/L — ABNORMAL HIGH (ref 7–232)
Total CK: 255 U/L — ABNORMAL HIGH (ref 7–232)

## 2010-04-16 LAB — HEMOGLOBIN A1C
Hgb A1c MFr Bld: 6 % — ABNORMAL HIGH (ref <5.7)
Mean Plasma Glucose: 126 mg/dL — ABNORMAL HIGH (ref <117)

## 2010-04-16 LAB — CARDIAC PANEL(CRET KIN+CKTOT+MB+TROPI): Relative Index: 0.7 (ref 0.0–2.5)

## 2010-04-16 LAB — TROPONIN I
Troponin I: 0.01 ng/mL (ref 0.00–0.06)
Troponin I: 0.02 ng/mL (ref 0.00–0.06)

## 2010-04-16 LAB — POCT I-STAT, CHEM 8
Calcium, Ion: 1.1 mmol/L — ABNORMAL LOW (ref 1.12–1.32)
Creatinine, Ser: 0.9 mg/dL (ref 0.4–1.5)
Hemoglobin: 13.9 g/dL (ref 13.0–17.0)
Sodium: 138 mEq/L (ref 135–145)
TCO2: 33 mmol/L (ref 0–100)

## 2010-04-16 LAB — PATHOLOGIST SMEAR REVIEW

## 2010-04-16 LAB — HIV ANTIBODY (ROUTINE TESTING W REFLEX): HIV: NONREACTIVE

## 2010-04-17 LAB — GLUCOSE, CAPILLARY
Glucose-Capillary: 109 mg/dL — ABNORMAL HIGH (ref 70–99)
Glucose-Capillary: 113 mg/dL — ABNORMAL HIGH (ref 70–99)
Glucose-Capillary: 115 mg/dL — ABNORMAL HIGH (ref 70–99)
Glucose-Capillary: 137 mg/dL — ABNORMAL HIGH (ref 70–99)
Glucose-Capillary: 168 mg/dL — ABNORMAL HIGH (ref 70–99)
Glucose-Capillary: 98 mg/dL (ref 70–99)

## 2010-04-17 LAB — BASIC METABOLIC PANEL
BUN: 5 mg/dL — ABNORMAL LOW (ref 6–23)
CO2: 36 mEq/L — ABNORMAL HIGH (ref 19–32)
Calcium: 9 mg/dL (ref 8.4–10.5)
Chloride: 96 mEq/L (ref 96–112)
Creatinine, Ser: 0.73 mg/dL (ref 0.4–1.5)
GFR calc Af Amer: >60 mL/min (ref >60)
GFR calc non Af Amer: >60 mL/min (ref >60)
Glucose, Bld: 100 mg/dL — ABNORMAL HIGH (ref 70–99)
Glucose, Bld: 98 mg/dL (ref 70–99)
Potassium: 4 mEq/L (ref 3.5–5.1)
Sodium: 138 mEq/L (ref 135–145)

## 2010-04-17 LAB — CARDIAC PANEL(CRET KIN+CKTOT+MB+TROPI)
CK, MB: 3.6 ng/mL (ref 0.3–4.0)
Relative Index: 0.6 (ref 0.0–2.5)
Relative Index: 0.7 (ref 0.0–2.5)
Total CK: 498 U/L — ABNORMAL HIGH (ref 7–232)
Total CK: 556 U/L — ABNORMAL HIGH (ref 7–232)
Troponin I: 0.01 ng/mL (ref 0.00–0.06)
Troponin I: 0.02 ng/mL (ref 0.00–0.06)

## 2010-04-17 LAB — CBC
HCT: 41.9 % (ref 39.0–52.0)
Hemoglobin: 14.6 g/dL (ref 13.0–17.0)
MCH: 26.8 pg (ref 26.0–34.0)
MCH: 27 pg (ref 26.0–34.0)
MCH: 27.5 pg (ref 26.0–34.0)
MCHC: 34.8 g/dL (ref 30.0–36.0)
MCHC: 35.1 g/dL (ref 30.0–36.0)
MCHC: 35.8 g/dL (ref 30.0–36.0)
MCV: 77 fL — ABNORMAL LOW (ref 78.0–100.0)
Platelets: 286 10*3/uL (ref 150–400)
Platelets: 294 10*3/uL (ref 150–400)
RBC: 5.1 MIL/uL (ref 4.22–5.81)
RBC: 5.44 MIL/uL (ref 4.22–5.81)
RDW: 15.4 % (ref 11.5–15.5)

## 2010-04-17 LAB — URINE CULTURE: Culture  Setup Time: 201112060142

## 2010-04-17 LAB — COMPREHENSIVE METABOLIC PANEL
AST: 26 U/L (ref 0–37)
Albumin: 3.6 g/dL (ref 3.5–5.2)
BUN: 7 mg/dL (ref 6–23)
Calcium: 9.1 mg/dL (ref 8.4–10.5)
Creatinine, Ser: 0.76 mg/dL (ref 0.4–1.5)
GFR calc Af Amer: >60 mL/min (ref >60)
GFR calc non Af Amer: >60 mL/min (ref >60)
Total Bilirubin: 0.7 mg/dL (ref 0.3–1.2)

## 2010-04-17 LAB — DIFFERENTIAL
Basophils Relative: 0 % (ref 0–1)
Eosinophils Absolute: 0.4 10*3/uL (ref 0.0–0.7)
Neutrophils Relative %: 62 % (ref 43–77)

## 2010-04-17 LAB — BLOOD GAS, ARTERIAL
Bicarbonate: 38.1 mEq/L — ABNORMAL HIGH (ref 20.0–24.0)
Delivery systems: POSITIVE
Drawn by: 205171
Expiratory PAP: 10
FIO2: 0.21 %
Inspiratory PAP: 10
O2 Saturation: 90.2 %
Patient temperature: 98.6
pH, Arterial: 7.427 (ref 7.350–7.450)

## 2010-04-17 LAB — TROPONIN I: Troponin I: 0.02 ng/mL (ref 0.00–0.06)

## 2010-04-17 LAB — URINALYSIS, ROUTINE W REFLEX MICROSCOPIC
Ketones, ur: NEGATIVE mg/dL
Nitrite: NEGATIVE
Protein, ur: NEGATIVE mg/dL

## 2010-04-17 LAB — POCT I-STAT 3, ART BLOOD GAS (G3+)
pCO2 arterial: 67.4 mmHg (ref 35.0–45.0)
pH, Arterial: 7.341 — ABNORMAL LOW (ref 7.350–7.450)

## 2010-04-17 LAB — BRAIN NATRIURETIC PEPTIDE: Pro B Natriuretic peptide (BNP): <30 pg/mL (ref 0.0–100.0)

## 2010-04-17 LAB — CULTURE, BLOOD (ROUTINE X 2)
Culture  Setup Time: 201112060601
Culture  Setup Time: 201112060602
Culture: NO GROWTH

## 2010-04-17 LAB — CK TOTAL AND CKMB (NOT AT ARMC)
CK, MB: 4.4 ng/mL — ABNORMAL HIGH (ref 0.3–4.0)
Relative Index: 0.7 (ref 0.0–2.5)

## 2010-04-17 LAB — RAPID URINE DRUG SCREEN, HOSP PERFORMED
Amphetamines: NOT DETECTED
Opiates: NOT DETECTED
Tetrahydrocannabinol: POSITIVE — AB

## 2010-04-17 LAB — HEMOGLOBIN A1C
Hgb A1c MFr Bld: 6.2 % — ABNORMAL HIGH (ref <5.7)
Mean Plasma Glucose: 131 mg/dL — ABNORMAL HIGH (ref <117)

## 2010-04-17 LAB — POCT CARDIAC MARKERS

## 2010-04-17 LAB — LIPID PANEL
Cholesterol: 199 mg/dL (ref 0–200)
LDL Cholesterol: 150 mg/dL — ABNORMAL HIGH (ref 0–99)

## 2010-05-14 LAB — POCT CARDIAC MARKERS
CKMB, poc: <1 ng/mL — ABNORMAL LOW (ref 1.0–8.0)
Troponin i, poc: <0.05 ng/mL (ref 0.00–0.09)

## 2010-05-14 LAB — D-DIMER, QUANTITATIVE: D-Dimer, Quant: <0.22 ug/mL-FEU (ref 0.00–0.48)

## 2010-05-14 LAB — COMPREHENSIVE METABOLIC PANEL
ALT: 23 U/L (ref 0–53)
AST: 19 U/L (ref 0–37)
Alkaline Phosphatase: 73 U/L (ref 39–117)
CO2: 32 mEq/L (ref 19–32)
Calcium: 9.1 mg/dL (ref 8.4–10.5)
GFR calc Af Amer: >60 mL/min (ref >60)
GFR calc non Af Amer: >60 mL/min (ref >60)
Glucose, Bld: 87 mg/dL (ref 70–99)
Potassium: 4.1 mEq/L (ref 3.5–5.1)
Sodium: 140 mEq/L (ref 135–145)
Total Protein: 7.4 g/dL (ref 6.0–8.3)

## 2010-05-14 LAB — BASIC METABOLIC PANEL
BUN: 13 mg/dL (ref 6–23)
Chloride: 103 mEq/L (ref 96–112)
Potassium: 4 mEq/L (ref 3.5–5.1)
Sodium: 140 mEq/L (ref 135–145)

## 2010-05-14 LAB — DIFFERENTIAL
Basophils Relative: 2 % — ABNORMAL HIGH (ref 0–1)
Eosinophils Absolute: 0.5 10*3/uL (ref 0.0–0.7)
Eosinophils Absolute: 0.4 10*3/uL (ref 0.0–0.7)
Eosinophils Relative: 3 % (ref 0–5)
Eosinophils Relative: 2 % (ref 0–5)
Lymphs Abs: 4 10*3/uL (ref 0.7–4.0)
Lymphs Abs: 4.6 10*3/uL — ABNORMAL HIGH (ref 0.7–4.0)
Monocytes Absolute: 1.6 10*3/uL — ABNORMAL HIGH (ref 0.1–1.0)
Monocytes Relative: 9 % (ref 3–12)
Monocytes Relative: 9 % (ref 3–12)
Neutrophils Relative %: 61 % (ref 43–77)

## 2010-05-14 LAB — URINALYSIS, ROUTINE W REFLEX MICROSCOPIC
Nitrite: NEGATIVE
Specific Gravity, Urine: 1.026 (ref 1.005–1.030)
Urobilinogen, UA: 1 mg/dL (ref 0.0–1.0)
pH: 7.5 (ref 5.0–8.0)

## 2010-05-14 LAB — CBC
HCT: 43.7 % (ref 39.0–52.0)
Hemoglobin: 14.9 g/dL (ref 13.0–17.0)
Hemoglobin: 15.2 g/dL (ref 13.0–17.0)
MCV: 80.8 fL (ref 78.0–100.0)
Platelets: 285 10*3/uL (ref 150–400)
RBC: 5.5 MIL/uL (ref 4.22–5.81)
WBC: 17 10*3/uL — ABNORMAL HIGH (ref 4.0–10.5)

## 2010-06-19 ENCOUNTER — Inpatient Hospital Stay (HOSPITAL_COMMUNITY)
Admission: EM | Admit: 2010-06-19 | Discharge: 2010-06-26 | DRG: 189 | Disposition: A | Discharge status: Skilled Nursing Facility | Payer: Medicare Other | Attending: Internal Medicine | Admitting: Internal Medicine

## 2010-06-19 ENCOUNTER — Emergency Department (HOSPITAL_COMMUNITY): Payer: Medicare Other

## 2010-06-19 DIAGNOSIS — R0902 Hypoxemia: Secondary | ICD-10-CM | POA: Diagnosis present

## 2010-06-19 DIAGNOSIS — F1210 Cannabis abuse, uncomplicated: Secondary | ICD-10-CM | POA: Diagnosis present

## 2010-06-19 DIAGNOSIS — N471 Phimosis: Secondary | ICD-10-CM | POA: Diagnosis present

## 2010-06-19 DIAGNOSIS — F17200 Nicotine dependence, unspecified, uncomplicated: Secondary | ICD-10-CM | POA: Diagnosis present

## 2010-06-19 DIAGNOSIS — I5033 Acute on chronic diastolic (congestive) heart failure: Secondary | ICD-10-CM | POA: Diagnosis present

## 2010-06-19 DIAGNOSIS — Z9119 Patient's noncompliance with other medical treatment and regimen: Secondary | ICD-10-CM

## 2010-06-19 DIAGNOSIS — E662 Morbid (severe) obesity with alveolar hypoventilation: Secondary | ICD-10-CM | POA: Diagnosis present

## 2010-06-19 DIAGNOSIS — Z91199 Patient's noncompliance with other medical treatment and regimen due to unspecified reason: Secondary | ICD-10-CM

## 2010-06-19 DIAGNOSIS — J209 Acute bronchitis, unspecified: Secondary | ICD-10-CM | POA: Diagnosis present

## 2010-06-19 DIAGNOSIS — G4733 Obstructive sleep apnea (adult) (pediatric): Secondary | ICD-10-CM | POA: Diagnosis present

## 2010-06-19 DIAGNOSIS — Z86711 Personal history of pulmonary embolism: Secondary | ICD-10-CM

## 2010-06-19 DIAGNOSIS — R5381 Other malaise: Secondary | ICD-10-CM | POA: Diagnosis present

## 2010-06-19 DIAGNOSIS — J9620 Acute and chronic respiratory failure, unspecified whether with hypoxia or hypercapnia: Principal | ICD-10-CM | POA: Diagnosis present

## 2010-06-19 DIAGNOSIS — I509 Heart failure, unspecified: Secondary | ICD-10-CM | POA: Diagnosis present

## 2010-06-19 DIAGNOSIS — J810 Acute pulmonary edema: Secondary | ICD-10-CM

## 2010-06-19 DIAGNOSIS — I10 Essential (primary) hypertension: Secondary | ICD-10-CM | POA: Diagnosis present

## 2010-06-19 DIAGNOSIS — F1410 Cocaine abuse, uncomplicated: Secondary | ICD-10-CM | POA: Diagnosis present

## 2010-06-19 DIAGNOSIS — N478 Other disorders of prepuce: Secondary | ICD-10-CM | POA: Diagnosis present

## 2010-06-19 DIAGNOSIS — Z79899 Other long term (current) drug therapy: Secondary | ICD-10-CM

## 2010-06-19 DIAGNOSIS — E785 Hyperlipidemia, unspecified: Secondary | ICD-10-CM | POA: Diagnosis present

## 2010-06-19 DIAGNOSIS — R0602 Shortness of breath: Secondary | ICD-10-CM

## 2010-06-19 LAB — POCT I-STAT, CHEM 8
Chloride: 97 mEq/L (ref 96–112)
Creatinine, Ser: 0.8 mg/dL (ref 0.4–1.5)
Glucose, Bld: 129 mg/dL — ABNORMAL HIGH (ref 70–99)
HCT: 41 % (ref 39.0–52.0)
Potassium: 4 mEq/L (ref 3.5–5.1)
Sodium: 137 mEq/L (ref 135–145)

## 2010-06-19 LAB — CARDIAC PANEL(CRET KIN+CKTOT+MB+TROPI): CK, MB: 1.6 ng/mL (ref 0.3–4.0)

## 2010-06-19 LAB — CBC
HCT: 38 % — ABNORMAL LOW (ref 39.0–52.0)
Hemoglobin: 13.6 g/dL (ref 13.0–17.0)
WBC: 15.3 10*3/uL — ABNORMAL HIGH (ref 4.0–10.5)

## 2010-06-19 LAB — POCT I-STAT 3, ART BLOOD GAS (G3+)
Bicarbonate: 36.2 mEq/L — ABNORMAL HIGH (ref 20.0–24.0)
Patient temperature: 98.2
TCO2: 38 mmol/L (ref 0–100)
pH, Arterial: 7.421 (ref 7.350–7.450)
pO2, Arterial: 58 mmHg — ABNORMAL LOW (ref 80.0–100.0)

## 2010-06-19 LAB — CK TOTAL AND CKMB (NOT AT ARMC)
Relative Index: 0.5 (ref 0.0–2.5)
Total CK: 362 U/L — ABNORMAL HIGH (ref 7–232)

## 2010-06-19 LAB — TSH: TSH: 3.897 u[IU]/mL (ref 0.350–4.500)

## 2010-06-19 LAB — DIFFERENTIAL
Basophils Absolute: 0 10*3/uL (ref 0.0–0.1)
Basophils Relative: 0 % (ref 0–1)
Lymphocytes Relative: 28 % (ref 12–46)
Neutro Abs: 9.3 10*3/uL — ABNORMAL HIGH (ref 1.7–7.7)
Neutrophils Relative %: 61 % (ref 43–77)

## 2010-06-19 LAB — POCT CARDIAC MARKERS
CKMB, poc: <1 ng/mL — ABNORMAL LOW (ref 1.0–8.0)
Myoglobin, poc: 96.2 ng/mL (ref 12–200)
Myoglobin, poc: 103 ng/mL (ref 12–200)
Troponin i, poc: <0.05 ng/mL (ref 0.00–0.09)

## 2010-06-19 LAB — D-DIMER, QUANTITATIVE: D-Dimer, Quant: 0.49 ug/mL-FEU — ABNORMAL HIGH (ref 0.00–0.48)

## 2010-06-19 LAB — TROPONIN I: Troponin I: <0.3 ng/mL (ref <0.30)

## 2010-06-19 LAB — MRSA PCR SCREENING: MRSA by PCR: NEGATIVE

## 2010-06-19 NOTE — Discharge Summary (Signed)
[name], HARM NO.:  [id number]   MEDICAL RECORD NO.:  [id number]          PATIENT TYPE:  INP   LOCATION:  1430                         FACILITY:  [facility]   PHYSICIAN:  Ladell Pier, M.D.   DATE OF BIRTH:  02/05/1965   DATE OF ADMISSION:  09/19/2007  DATE OF DISCHARGE:  09/21/2007                               DISCHARGE SUMMARY   DISCHARGE DIAGNOSES:  1. Rectal bleeding.  2. Chest pain.  3. Morbid obesity.  4. Hypertension.  5. Probably obstructive sleep apnea.  6. Glucose intolerance.  7. Morbid obesity.  8. Homeless.  9. Dyslipidemia.  10.Leukocytosis.   DISCHARGE MEDICATIONS:  1. Hydrochlorothiazide 25 mg daily.  2. Norvasc 10 mg daily.  3. Zocor 20 mg daily.  4. Aspirin 81 mg daily.  5. Preparation H p.r.n.   FOLLOW-UP APPOINTMENTS:  The patient to follow up with Dr. Reche Dixon.  He  has an appointment scheduled he states for this month, August 26.   PROCEDURES:  None.   CONSULTANTS:  None   HISTORY OF PRESENT ILLNESS:  The patient is a 45 year old, African  American male that was recently discharged from the hospital with chest  pain.  He represented with chest pain and rectal bleeding.  Please see  admission note for remainder of history.  Past medical history, family  history, social history, medication, allergies, review of systems per  admission H&P.   PHYSICAL EXAMINATION:  Temperature 98.2, pulse of 86, respirations 20,  blood pressure 140/90, pulse oximetry 95% on room air.  HEENT:  Normocephalic, atraumatic.  Pupils reactive to light.  Throat  without erythema.  CARDIOVASCULAR:  Regular rate and rhythm.  LUNGS:  Clear bilaterally.  ABDOMEN:  Obese.  Positive bowel sounds.  EXTREMITIES:  Without edema.   HOSPITAL COURSE:  1. Rectal bleeding:  The patient was admitted to the hospital.  It was      thought that his rectal bleeding was secondary to anal fissures and      hemorrhoids.  His hemoglobin was followed and remained  stable.  He      will follow up outpatient with Dr. Reche Dixon for possible outpatient      GI workup.  2. Chest pain:  He was recently in the hospital with chest pain.  He      had D-dimer done that was normal.  A 2-D echocardiogram and cardiac      enzymes were all normal.  Since he does have risk factors of      hypertension and dyslipidemia, he should follow up outpatient for      outpatient stress test.  3. Obesity:  Encourage diet and exercise.  4. Dyslipidemia.  Continue him on his statin medication while he was      inpatient.  5. Question of obstructive sleep apnea.  The patient to follow up for      outpatient sleep study.  6. Homeless:  Will arrange with social worker for transportation and      for the patient to receive his medications.  7. Hypertension:  Blood pressure was borderline while  he was an      inpatient.  He will follow up with his primary care physician for      further adjustments of his antihypertensive medications  8. Leukocytosis:  The patient does have an elevated white count.  That      was also present in his previous hospitalization.  He should follow      up outpatient regarding leukocytosis and to get maybe a repeat CBC      with differential and a smear to be examined.  Chest x-ray showed      no focal acute finding.   DISCHARGE LABORATORY DATA:  Sodium 138, potassium 3.7, chloride 95, CO2  36, glucose 133, BUN 9, creatinine 0.87.  WBC 16.1, hemoglobin 14.9,  platelets 310, MCV 81.2.      Ladell Pier, M.D.  Electronically Signed     [place]/MEDQ  D:  09/21/2007  T:  09/21/2007  Job:  [id number]   cc:   Dineen Kid. Reche Dixon, M.D.  Fax: [phone]

## 2010-06-19 NOTE — H&P (Signed)
[name], [name] NO.:  [id number]   MEDICAL RECORD NO.:  [id number]          PATIENT TYPE:  EMS   LOCATION:  ED                           FACILITY:  [facility]   PHYSICIAN:  Vania Rea, M.D. DATE OF BIRTH:  03/05/1965   DATE OF ADMISSION:  08/11/2007  DATE OF DISCHARGE:                              HISTORY & PHYSICAL   PRIMARY CARE PHYSICIAN:  Unassigned.   CHIEF COMPLAINT:  Acute chest pain and shortness of breath.   HISTORY OF PRESENT ILLNESS:  This is a 45 year old morbidly obese  African American gentleman who denies any past medical history but  computer records indicate he has a history of tobacco and polysubstance  abuse with an admission for suicidal ideation.  Patient presents with a  history of sudden onset of swelling of his feet two days ago, chest pain  radiating at his neck, starting yesterday, and getting suddenly worse  this morning.  The patient says he has chronic dyspnea on exertion but  has never felt a similar type of pain.  The pain is sharp, 10/10 and  apparently is associated with nausea, vomiting, diaphoresis.  He denies  any recent cocaine use.  He says he last used marijuana about three days  ago.  Smokes three cigarettes per day and drinks alcohol only rarely.  He denies any sick contacts.  He has no family history of cardiac  disease.  He lives alone.   PAST MEDICAL HISTORY:  1. Polysubstance abuse.  2. Suicidal ideation, as noted above.   MEDICATIONS:  None.   ALLERGIES:  No known drug allergies.   SOCIAL HISTORY:  Lives alone.  Tobacco and alcohol use, as noted above.  Previous history of cocaine use.   FAMILY HISTORY:  A strong family history of diabetes, otherwise  unremarkable.   REVIEW OF SYSTEMS:  Other than noted above, 10-point review of systems  was unremarkable.   PHYSICAL EXAMINATION:  A morbidly obese African American gentleman  sitting up in the stretcher, anxious, and somewhat irritated that he has  to repeat his history again, although he seems to be less than  forthcoming with the full history, considering the hospital records.  He  appears to be i moderate respiratory distress.  VITALS:  Temperature 99.1, pulse 94, respirations 24, blood pressure  152/80.  He is saturating at 92% on 2 liters.  Pupils are equal and round.  Mucous membranes are pink and anicteric.  He has a very thick neck.  Unable to appreciate thyromegaly or jugular  venous distention.  There is no carotid bruit.  CHEST:  He has coarse breath sounds and fine crackles at the bases.  CARDIOVASCULAR:  Regular rhythm.  No murmurs appreciated.  ABDOMEN:  Massively obese but nontender.  No masses are felt.  EXTREMITIES:  There is 1+ edema bilaterally; however, it is not soft and  pitting.  Not markedly different from the usual edema seen in morbidly  obese patients.  There is no bone or joint deformities appreciated.  CENTRAL NERVOUS SYSTEM:  Cranial nerves II-XII are grossly intact.   LABS:  His hemoglobin is 15.6.  White count is not yet available.  Sodium is 140, potassium 4, BUN 14, creatinine 1.1, glucose 120.  His  cardiac enzymes are completely normal.  Troponin is undetectable.  His B-  type natriuretic peptide is undetectable.   Chest x-ray shows bilateral air space disease.   ASSESSMENT/PLAN:  1. Acute chest pain in a gentleman with a history of very recent onset      of lower extremity edema, shortness of breath, and chest pain.      Given the chest x-ray, congestive heart failure is not      unreasonable; however, his BNP is less than 30, which rules out      congestive heart failure, unless this is flash pulmonary edema.  He      has already been given IV Lasix by the emergency room physician, so      we will monitor his BNP and monitor his cardiac enzymes, and get a      2D echo to evaluate the condition of his heart.  2. Bilateral pneumonia is another consideration.  We will do blood      cultures  and start IV antibiotics.  Check his white cell count.  3. Will also get an ABG.  Old ABG indicates he is a CO2 retainer.      Probably has some degree of obesity hypoventilation syndrome.  4. History of polysubstance abuse.  Will do a urine drug screen, since      cocaine ingestion could have contributed to flash pulmonary      embolus.  Will admit him to telemetry or step-down for monitoring      of his vital signs and respiratory status.   Other plans as per orders.      Vania Rea, M.D.  Electronically Signed     LC/MEDQ  D:  08/11/2007  T:  08/11/2007  Job:  [id number]

## 2010-06-19 NOTE — H&P (Signed)
[name], CHARTERS NO.:  [id number]   MEDICAL RECORD NO.:  [id number]          PATIENT TYPE:  INP   LOCATION:  0111                         FACILITY:  [facility]   PHYSICIAN:  Wilson Singer, M.D.DATE OF BIRTH:  [birth year]   DATE OF ADMISSION:  09/19/2007  DATE OF DISCHARGE:                              HISTORY & PHYSICAL   HISTORY:  This is a 45 year old man who was recently admitted and  discharged in the first and second week of July, when he presented with  acute chest pain and shortness of breath.  He now presents with very  similar symptoms and also painful rectal bleeding for the last 2 days  and relates that about 25% of the stool was covered with blood.  On his  previous admission, it was felt that his chest pain was noncardiac in  nature, and workup that was done, including serial cardiac enzymes and  an echocardiogram, were essentially within normal limits and negative  except that he did have left ventricular hypertrophy with no wall motion  abnormalities.  He also has a history of polysubstance abuse, including  tobacco, alcohol and cocaine, and he was counseled about this.  It was  also felt that he had probable sleep apnea, but this needed to be  investigated as an outpatient.   PAST MEDICAL HISTORY:  Please see previous history and physical done on  August 11, 2007.   SOCIAL HISTORY:  He is single.  The patient is homeless, to my knowledge  still uninsured.  History of tobacco and alcohol abuse.   FAMILY HISTORY:  Noncontributory.   REVIEW OF SYSTEMS:  Apart from symptoms mentioned above there are no  other referable to all systems reviewed.   MEDICATIONS:  He says he continues on medications on discharge, which  included HCTZ 25 mg daily, Norvasc 10 mg daily, Zocor 20 mg daily,  aspirin 81 mg daily.   PHYSICAL EXAMINATION:  VITAL SIGNS:  Temperature 97, blood pressure  125/71, pulse 89, respiratory rate 14 to 16, saturation 96% on oxygen.  CARDIOVASCULAR:  Heart sounds present and normal without murmurs.  Heart  sounds are distant.  LUNGS:  Lung fields are essentially clear.  ABDOMEN:  Soft and nontender, and the patient is clearly very obese.  NEUROLOGICAL:  Alert and oriented without any focal neurological signs.   INVESTIGATIONS:  Troponin less than 0.05.  Urine drug screen was  negative, including cocaine also being negative.  Urinalysis was  negative.  Alcohol level less than 5.  BNP less than 30.  Hemoglobin  14.8.  White blood cell count 15, platelets 316.  Sodium 138, potassium  4.2, glucose 101, BUN 12, creatinine 1.0.  Arterial blood gas on 2 L of  oxygen revealed a pH of 7.41, pCO2 of 49.1, pO2 of 97.6, oxygen  saturation 97.3%.  Electrocardiogram shows normal sinus rhythm, and there are no acute ST/T  wave changes.  Chest x-ray was difficult to interpret because of his  body habitus, but there were no obvious abnormalities except for  cardiomegaly.   IMPRESSION:  1. Lower  gastrointestinal bleed, clinically likely secondary to anal      or rectal fissure with a history of painful rectal bleeding.  2. Chest pain.  We will check a D-dimer to make sure there is no      pulmonary embolic phenomenon.  Cardiac pathology unlikely although      the patient is at risk for this.  3. Morbid obesity.  4. Hypertension, currently controlled.  5. Probable sleep apnea by history.  6. Glucose intolerance.   PLAN:  1. Admit.  2. Serial hemoglobins.  3. Serial cardiac enzymes and ECGs.  4. Check D-dimer.   Further recommendations will depend on the patient's hospital progress.      Wilson Singer, M.D.  Electronically Signed     NCG/MEDQ  D:  09/19/2007  T:  09/19/2007  Job:  [id number]

## 2010-06-19 NOTE — Discharge Summary (Signed)
[name], [name] NO.:  [id number]   MEDICAL RECORD NO.:  [id number]          PATIENT TYPE:  INP   LOCATION:  1418                         FACILITY:  [facility]   PHYSICIAN:  Lonia Blood, M.D.DATE OF BIRTH:  1965-11-05   DATE OF ADMISSION:  08/11/2007  DATE OF DISCHARGE:  08/15/2007                               DISCHARGE SUMMARY   PRIMARY CARE PHYSICIAN:  Previously unassigned - newly assigned to Dr.  Donia Guiles at [facility].   DISCHARGE DIAGNOSES:  1. Chest pain.      a.     Cardiac enzymes negative x3 full sets.      b.     Echocardiogram without evidence of acute myocardial       infarction.      c.     Symptoms not consistent with true angina.      d.     Echocardiogram revealing evidence of left ventricular       hypertrophy but no wall motion abnormalities.  2. Uncontrolled, untreated hypertension.  3. Impaired glucose tolerance.  4. Polysubstance abuse.      a.     Tobacco.      b.     Alcohol.      c.     Cocaine.  5. Remote history of suicidal ideation.  6. Newly diagnosed hyperlipidemia.  7. Morbid obesity.  8. Probable sleep apnea - outpatient evaluation recommended.  9. Social stressors - homeless and uninsured.  10.Acute bronchitis.   DISCHARGE MEDICATIONS:  1. HCTZ 25 mg p.o. daily.  2. Norvasc 10 mg p.o. daily.  3. Zocor 20 mg p.o. nightly.  4. Doxycycline 100 mg p.o. b.i.d. for 5 days then stop.  5. It is recommended that ACE inhibitor be initiated in the outpatient      setting once the patient has established ongoing care such that      renal function can be followed closely.  6. Aspirin 81 mg daily.   FOLLOW UP:  The patient has been scheduled to follow up with Donia Guiles at [facility] on August 26 8:45 in the morning.  At that  time, consideration should be given to initiating ACE inhibitor.  Additionally, CBG should be checked.  Furthermore, CMET should be  obtained to assure that the patient is tolerating  his Lipitor.  Consideration should also be given to referring the patient to a sleep  center for the possible diagnosis of sleep apnea.   CONSULTATIONS:  None.   PROCEDURE:  Transthoracic echocardiogram August 12, 2007 - LV systolic  function normal.  LV ejection fraction 60%.  No regional wall motion  abnormalities.   CONSULTATIONS:  None.   HOSPITAL COURSE:  [name] is a very pleasant 45 year old  gentleman who presented to the hospital on date of his admission with  acute chest pain and shortness of breath.  He also reported sudden  swelling of his feet in the two days prior to his admission.  Chest pain  was described as sharp and stabbing and radiated into his right and left  necks  equally.  It had started suddenly and reportedly while the patient  was at rest.  The patient did admit to chronic dyspnea on exertion.  The  patient was admitted to the acute units.  Cardiac enzymes were cycled x3  and were unrevealing.  Serial EKGs were obtained and were without  evidence of acute MI/acute coronary syndrome.  Transthoracic  echocardiogram was carried out and revealed evidence of left ventricular  hypertrophy but otherwise no focal wall motion abnormalities.  D-dimer  was obtained and was found to be normal and therefore pulmonary embolism  was not felt to be likely.   Chest x-ray was obtained and raised the question of possible bilateral  lower lobe pneumonia.  Clinically the patient had symptoms consistent  with an acute bronchitis.  The patient was treated with empiric  antibiotics.  The patient's symptoms improved significantly.  urine drug  screen was accomplished and revealed evidence of cocaine.  This was  discussed with the patient and he vehemently denied use of cocaine.  It  was explained to the patient that cocaine can cause acute coronary  vasospasm leading to acute sudden cardiac death, life-threatening MI, or  stroke.  He was advised to abstain completely if she  should have  involved himself with cocaine use in the past.  Hyperglycemia was  appreciated with CBGs ranging as high as 172.  Fasting CBG was obtained  and was noted to be approximately 102.  Hemoglobin A1c was obtained and  was noted to be 6.1.  LDL was accomplished and was noted to be 144.  Anti-lipid medications were initiated and the patient was counseled on  the need to follow a strict low-carb, low concentrated sugar diet.  He  was advised that he likely suffers with impaired glucose tolerating and  advised that close medical follow-up would be recommended.  The patient  had no further episodes of chest pain.  His pain is felt to have been  secondary to a probable coronary artery spasm brought about by cocaine  use.  Given the patient's impressive body habitus it is very likely that  he has sleep apnea.  Episodes of desaturation were noted during the  night during the patient's hospital stay.  Outpatient evaluation with a  formal sleep study and consideration to use of CPAP is recommended.  By  August 15, 2007 the patient had stabilized significantly.  Blood pressures  were stable as were other vitals.  O2 sats were 95%+ on room air.  Symptoms of acute bronchitis had significantly improved.  The patient  was therefore cleared for discharge home.  Follow-up has been arranged  on his behalf at [facility] and he is provided with prescriptions from  the $4.00 [employer] list at the time of his discharge.      Lonia Blood, M.D.  Electronically Signed     JTM/MEDQ  D:  08/15/2007  T:  08/15/2007  Job:  [id number]

## 2010-06-20 DIAGNOSIS — R0602 Shortness of breath: Secondary | ICD-10-CM

## 2010-06-20 DIAGNOSIS — I517 Cardiomegaly: Secondary | ICD-10-CM

## 2010-06-20 LAB — BASIC METABOLIC PANEL
CO2: 37 mEq/L — ABNORMAL HIGH (ref 19–32)
Calcium: 9.2 mg/dL (ref 8.4–10.5)
Creatinine, Ser: 0.72 mg/dL (ref 0.4–1.5)
GFR calc Af Amer: >60 mL/min (ref >60)
GFR calc non Af Amer: >60 mL/min (ref >60)
Sodium: 136 mEq/L (ref 135–145)

## 2010-06-20 LAB — CARDIAC PANEL(CRET KIN+CKTOT+MB+TROPI)
CK, MB: 1.4 ng/mL (ref 0.3–4.0)
Relative Index: 0.6 (ref 0.0–2.5)
Total CK: 240 U/L — ABNORMAL HIGH (ref 7–232)
Troponin I: <0.3 ng/mL (ref <0.30)

## 2010-06-20 LAB — CBC
HCT: 39.3 % (ref 39.0–52.0)
Hemoglobin: 13.7 g/dL (ref 13.0–17.0)
RBC: 5.28 MIL/uL (ref 4.22–5.81)
WBC: 20.1 10*3/uL — ABNORMAL HIGH (ref 4.0–10.5)

## 2010-06-20 LAB — STREP PNEUMONIAE URINARY ANTIGEN: Strep Pneumo Urinary Antigen: NEGATIVE

## 2010-06-20 LAB — URINE DRUGS OF ABUSE SCREEN W ALC, ROUTINE (REF LAB)
Amphetamine Screen, Ur: NEGATIVE
Barbiturate Quant, Ur: NEGATIVE
Creatinine,U: 24.6 mg/dL
Marijuana Metabolite: NEGATIVE
Methadone: NEGATIVE

## 2010-06-20 LAB — URINE CULTURE
Colony Count: NO GROWTH
Culture: NO GROWTH

## 2010-06-20 LAB — LIPID PANEL
Cholesterol: 169 mg/dL (ref 0–200)
Triglycerides: 59 mg/dL (ref <150)
VLDL: 12 mg/dL (ref 0–40)

## 2010-06-21 LAB — BASIC METABOLIC PANEL
BUN: 15 mg/dL (ref 6–23)
Calcium: 8.9 mg/dL (ref 8.4–10.5)
GFR calc non Af Amer: >60 mL/min (ref >60)
Glucose, Bld: 128 mg/dL — ABNORMAL HIGH (ref 70–99)
Potassium: 3.7 mEq/L (ref 3.5–5.1)

## 2010-06-22 ENCOUNTER — Inpatient Hospital Stay (HOSPITAL_COMMUNITY): Payer: Medicare Other

## 2010-06-22 DIAGNOSIS — G4733 Obstructive sleep apnea (adult) (pediatric): Secondary | ICD-10-CM

## 2010-06-22 DIAGNOSIS — G4736 Sleep related hypoventilation in conditions classified elsewhere: Secondary | ICD-10-CM

## 2010-06-22 LAB — BASIC METABOLIC PANEL
BUN: 15 mg/dL (ref 6–23)
CO2: 36 mEq/L — ABNORMAL HIGH (ref 19–32)
Calcium: 9.4 mg/dL (ref 8.4–10.5)
Creatinine, Ser: 0.68 mg/dL (ref 0.4–1.5)
GFR calc Af Amer: >60 mL/min (ref >60)

## 2010-06-22 LAB — CBC
MCH: 26.1 pg (ref 26.0–34.0)
MCHC: 35 g/dL (ref 30.0–36.0)
Platelets: 324 10*3/uL (ref 150–400)
RBC: 5.22 MIL/uL (ref 4.22–5.81)
RDW: 15.4 % (ref 11.5–15.5)
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
## 2010-06-22 NOTE — H&P (Signed)
[name], [name] NO.:  [id number]   MEDICAL RECORD NO.:  [id number]          PATIENT TYPE:  IPS   LOCATION:  0503                          [facility]ACILITY:  [facility]   PHYSICIAN:  Geoffery Lyons, M.D.      DATE OF BIRTH:  24-Aug-1965   DATE OF ADMISSION:  06/22/2004  DATE OF DISCHARGE:                         PSYCHIATRIC ADMISSION ASSESSMENT   This is a voluntary admission to the services of Dr. Geoffery Lyons.  Today's  date is Jun 23, 2004.   IDENTIFYING STATEMENT:  This is a 45 year old single African-American male.  Apparently he presented to mental health yesterday.  He stated that he had  lost his job 2 months.  He has been unable to find a new one.  He has  multiple life stressors and substance abuse.  A neighbor's children were  picking on him.  He had thoughts to stab them and then kill himself.  In the  past he was imprisoned in the 1990s because of drug charges.  Today he is  tearful, hopeless, irritable, agitated, easily agitated, and he is still  suicidal today, although he denies homicidal ideation or auditory or visual  hallucinations.   PAST PSYCHIATRIC HISTORY:  He had substance abuse treatment in prison;  otherwise, he has no inpatient or outpatient  psychiatric history.   SOCIAL HISTORY:  He states that he had 2 years of college.  He has worked in  Engineering geologist.  He has never married.  He has no children.  He has been unemployed  for a couple of months.   FAMILY HISTORY:  His mother has some sort of mental health problem.  He is  not clear as to what it is.   ALCOHOL AND DRUG HISTORY:  He uses $10 of crack three times a week since  1988.  He did use $10 worth yesterday.  Marijuana $20 worth daily since  1988.  He used $20 worth 2 days ago.  He drinks 40-ounce beers 2-3 times a  weeks since 1999.  He had a 40-ounce beer 2 days ago.   PRIMARY CARE Margel Joens:  He has none.   MEDICAL PROBLEMS:  Other than endogenous obesity (his weight is 307), none  are  known.   MEDICATIONS:  None.   DRUG ALLERGIES:  No known drug allergies.   PHYSICAL EXAMINATION:  This is an morbidly obese, African-American male who  does have some labored respirations at rest.  His height is 67 inches.  His  weight is 307.  His temperature was 98.  His blood pressure was 119/68  sitting down and standing up it 129/79.  But we may not have an appropriate  cuff.  His respirations were 28.   MENTAL STATUS EXAM:  He is alert and oriented x 3.  He was appropriately  groomed.  He is dressed in a hospital gown.  He is more than adequately  nourished.  His speech is a normal rate, rhythm and tone.  His mood is  irritable.  He gets argumentative.  His affect is congruent.  His thought  processes are clear, rational  and goal-oriented.  He wants to be taken care  of.  Concentration and memory are intact.  Judgment and insight are fair.  Intelligence is at least average.  He states that he still feels suicidal.  Burgess Estelle he had ideas to get a knife and cut himself.  He is negative for  homicidal ideation, and he denies auditory or visual hallucinations.   ADMISSION DIAGNOSES:   AXIS I:  1.  Polysubstance dependence.  2.  Polysubstance induced depressive disorder, not otherwise specified.   AXIS II:  Deferred.   AXIS III:  Obesity.   AXIS IV:  Severe, financial, occupational.   AXIS V:  30.   PLAN:  We will admit and help stabilize.  Toward that end we will start  Wellbutrin XL 150 mg p.o. daily today and escalate that dose to 300 mg  tomorrow.  We will have the case manager help identify outpatient substance  abuse therapy and have the case manager help work with him regarding  employment.      MD/MEDQ  D:  06/23/2004  T:  06/23/2004  Job:  [id number]

## 2010-06-24 LAB — BASIC METABOLIC PANEL
BUN: 11 mg/dL (ref 6–23)
Calcium: 9.3 mg/dL (ref 8.4–10.5)
Chloride: 92 mEq/L — ABNORMAL LOW (ref 96–112)
Creatinine, Ser: 0.64 mg/dL (ref 0.4–1.5)
GFR calc Af Amer: >60 mL/min (ref >60)
GFR calc non Af Amer: >60 mL/min (ref >60)

## 2010-06-25 LAB — BASIC METABOLIC PANEL
Calcium: 9.3 mg/dL (ref 8.4–10.5)
Creatinine, Ser: 0.7 mg/dL (ref 0.4–1.5)
GFR calc Af Amer: >60 mL/min (ref >60)
GFR calc non Af Amer: >60 mL/min (ref >60)
Sodium: 133 mEq/L — ABNORMAL LOW (ref 135–145)

## 2010-06-26 LAB — CULTURE, BLOOD (ROUTINE X 2)
Culture  Setup Time: 201205160140
Culture: NO GROWTH

## 2010-07-01 NOTE — H&P (Signed)
[name], [name] NO.:  [id number]  MEDICAL RECORD NO.:  [id number]           PATIENT TYPE:  E  LOCATION:  [facility]                         FACILITY:  [facility]  PHYSICIAN:  Isidor Holts, M.D.  DATE OF BIRTH:  31-Dec-1965  DATE OF ADMISSION:  06/19/2010 DATE OF DISCHARGE:                             HISTORY & PHYSICAL   PRIMARY MD:  None.  CHIEF COMPLAINT:  Progressive shortness of breath for 2 weeks, cough productive of yellow phlegm for the same period of time.  HISTORY OF PRESENT ILLNESS:  This is a 45 year old male.  For past medical history, see below.  The patient is usually short of breath on exertion at baseline and he is very morbidly obese.  However, over the last 2 weeks, he has become progressively short of breath above baseline so much so, that he is now also short of breath at rest.  In that period of time, he has had occasional cough productive of yellowish phlegm. Denies fever however, or chills.  Today, the patient developed left- sided chest tightness, which has become persistent.  In addition, he has developed progressive bilateral lower extremity swelling and experiences difficulty in ambulation secondary to this, as well as dyspnea.  PAST MEDICAL HISTORY: 1. Morbid obesity. 2. Obstructive sleep apnea syndrome, on nocturnal BiPAP. 3. Obesity hypoventilation syndrome. 4. History of diastolic CHF (ejection fraction 60%-65% per 2-D     echocardiogram of December 2011). 5. Hypertension. 6. Dyslipidemia. 7. Chronic low back pain. 8. Smoking history. 9. History of marijuana use. 10.Remote history of cocaine use.  The patient according to him, quit     in 2010. 11.Osteoarthritis.  ALLERGIES:  TRAMADOL, this causes angioedema.  MEDICATION HISTORY: 1. Advair Diskus (250/50) 1 puff b.i.d. 2. Albuterol nebulizer p.r.n. every 6 hourly for shortness of breath     and wheeze. 3. Colace 100 mg p.o. b.i.d. 4. Lasix 40 mg p.o. daily. 5.  Norvasc 5 mg p.o. daily. 6. Prinivil 10 mg p.o. daily. 7. Tylenol extra-strength 1 g p.o. p.r.n. t.i.d. for pain. 8. Zocor 20 mg p.o. at bedtime.  REVIEW OF SYSTEMS:  As per HPI and chief complaint.  The patient denies abdominal pain, vomiting or diarrhea.  He has occasional constipation, which is relieved by Colace.  Systems review is otherwise negative.  SOCIAL HISTORY:  The patient is unemployed since 2008.  He is currently applying for disability and his disability interview is coming up soon. He is a resident of a group home, single, has an offspring.  Smokes approximately three cigarettes per day.  Utilizes marijuana occasionally.  Used to utilize cocaine, but quit in 2010.  FAMILY HISTORY:  The patient's mother age is 4 years, she has heart problems and some kind of cancer.  The patient's father was killed by stabbing at age 63 years.  PHYSICAL EXAMINATION:  VITAL SIGNS:  Temperature 98.2, pulse 94 per minute, regular, respiratory rate 22, BP 118/79 mmHg, pulse oximeter 96% on 2 liters of oxygen.  The patient is currently on BiPAP. GENERAL:  At the time of this evaluation, he is short of breath on minimal  exertion and continues to have left-sided chest tightness.  He is communicative, however, in complete sentences. HEENT:  No clinical pallor.  No jaundice. CHEST:  Bilateral expiratory rhonchi.  No crackles. HEART:  S1, S2 heard, normal and regular.  No murmurs. ABDOMEN:  Morbidly obese.  Unable to palpate organs. EXTREMITIES:  Lower extremity examination, the patient has at least moderate pitting edema bilaterally. MUSCULOSKELETAL SYSTEM:  Appears unremarkable. CENTRAL NERVOUS SYSTEM:  No focal neurologic deficits on gross examination.  INVESTIGATIONS:  CBC WBC 15.3, hemoglobin 13.9, hematocrit 41.0, platelets 310.  Electrolytes sodium 137, potassium 4.0, chloride 97, CO2 32, BUN 10, creatinine 0.80, glucose 129.  Troponin I point-of-care less than 0.05.  BNP 16.9.   Chest x-ray on Jun 19, 2010 shows stable cardiomegaly, pulmonary venous congestion, mild interstitial pulmonary edema worse than on chest x-ray of March 06, 2010.  This also mild bibasilar atelectasis.  A 12-lead EKG Jun 19, 2010 shows sinus rhythm, regular, 83 per minute.  Left axis deviation, otherwise no acute ischemic changes.  There are old Q-waves in V1 and V2.  ASSESSMENT AND PLAN: 1. Congestive heart failure.  The patient has at least mild diastolic     congestive heart failure, as evidenced by progressive shortness of     breath on exertion, progressive bilateral lower extremity edema     and chest x-ray findings consistent with congestive heart failure.     Amazingly, BNP is only 16.6.  I suspect that the patient has cor     pulmonale on the basis of obstructive sleep apnea syndrome and     obesity hypoventilation syndrome, as well as chronic obstructive     pulmonary disease.  We shall continue BiPAP, check ABGs, manage     with intravenous Lasix.  2. Acute bronchitis.  This is clearly contributary to the patient's     shortness of breath and wheeze.  We shall manage with Avelox and     bronchodilator nebulizers, as well as Mucinex.  3. Chronic obstructive pulmonary disease exacerbation.  This is     secondary to congestive heart failure and acute bronchitis, as     described above.  We shall manage as described above and also utilize     steroids.  4. Smoking history/marijuana use.  The patient has been counseled     appropriately.  Unfortunately, he continues to smoke.  5. Obstructive sleep apnea syndrome.  The patient is already on BiPAP,     which we will continue.  6. Chest pain.  The patient has no acute ischemic changes on 12-lead     EKG.  Initial set of cardiac enzymes at point of care is negative.     We shall complete cycling cardiac enzymes, although strongly doubt     acute coronary syndrome.  However most concerning in this scenario,     is a likelihood  pulmonary embolism.  The patient is certainly at     risk for this.  However, he is markedly morbidly obese and     therefore, we will be unable to do chest CT angiogram.  However, we     can arrange a V/Q scan and also do D-dimer.  These will be     implemented accordingly.    Further management will depend on clinical course.   The patient will be admitted to step-down unit.    Comment:  Given the patient's significant respiratory issues,     including obstructive sleep apnea, obesity hypoventilation  syndrome, multifactorial dyspnea and possibility of pulmonary     embolism, he will greatly benefit from pulmonary consultation,     which we shall request accordingly.     Isidor Holts, M.D.     CO/MEDQ  D:  06/19/2010  T:  06/19/2010  Job:  [id number]  Electronically Signed by Isidor Holts M.D. on 07/01/2010 05:21:45 PM

## 2010-07-17 NOTE — Discharge Summary (Signed)
[name], [name] NO.:  [id number]  MEDICAL RECORD NO.:  [id number]           PATIENT TYPE:  I  LOCATION:  3032                         FACILITY:  [facility]  PHYSICIAN:  Lonia Blood, M.D.DATE OF BIRTH:  1965/07/29  DATE OF ADMISSION:  06/19/2010 DATE OF DISCHARGE:  06/26/2010                              DISCHARGE SUMMARY   PRIMARY CARE PHYSICIAN:  Currently unassigned - working to obtain a new physician for the patient.  DISCHARGE DIAGNOSES: 1. Multifactorial acute respiratory failure - resolved. 2. Pulmonary edema/severe volume overload - currently euvolemic. 3. Obesity hypoventilation syndrome with severe sleep apnea.     a.     Prior noncompliance with BiPAP use.     b.     Multiple long discussions concerning compliance with BiPAP      carried out.     c.     The patient much more compliant with BiPAP during hospital      stay. 4. Acute-on-chronic diastolic congestive heart failure - much     improved. 5. Acute infectious bronchitis - has completed antibiotic course. 6. Morbid obesity. 7. Tobacco abuse - the patient is counseled extensively on the need to     discontinue. 8. Paraphimosis - following clinically at present time - secondary to     a condom catheter. 9. Deconditioning - will require an assisted living level of care at     discharge. 10.Prior history of cocaine abuse - discontinued in 2010. 11.Marijuana abuse - counseled on need to stop. 12.Allergy to TRAMADOL leading to angioedema.  DISCHARGE MEDICATIONS:  A complete list of the patient's discharge medications is available as per the discharge med manager portion of the patient's [facility] E-chart computer system file.  A hard copy of this medication list is provided to the patient's facility at the time of his discharge.  CONSULTATIONS:  [facility].  FOLLOWUP: 1. The patient is advised to follow up in the outpatient setting     within 1-2 weeks with Dr.  Shan Levans, at [facility].  He     is provided with a card from Dr. Delford Field, given the office number     and instructed to call to make this arrangement. 2. Pulmonary Service, as stated they will arrange for outpatient auto     BiPAP device for the patient to use after his discharge. 3. We are attempting to arrange for the patient to follow up with a     primary care physician/social worker and hopefully arrangements     will be made for this prior to the patient's discharge from the     hospital.  PROCEDURES:  Transthoracic echocardiogram on Jun 20, 2010 - LV cavity size was normal.  Wall thickness increased in pattern mild left ventricular hypertrophy.  Systolic function normal.  Ejection fraction 60-65%.  Left atrium mildly dilated.  Technically limited due to poor sound wave transmission.  HOSPITAL COURSE:  [name] is a very pleasant, morbidly obese, 45- year-old gentleman with known sleep apnea who was admittedly very poorly compliant with his BiPAP therapy.  He was  also very poorly compliant with his outpatient medication regimen.  The patient presented to the hospital on the date of his admission with primary complaint of severe shortness of breath.  He also had cough productive of thick yellow phlegm.  The patient was admitted to acute unit after he was noted to be suffering with significant multifactorial respiratory distress.  He was placed on antibiotics and nebulizer therapies for diagnosis of acute infectious bronchitis.  Pulmonary consultation was carried out given the complexities surrounding the patient's lung disease.  Lower extremity Dopplers were carried out and were unrevealing for evidence of a DVT. Likewise, a D-dimer was assessed and though was very much borderline at 0.49, it was not felt to be significantly elevated from a clinical standpoint.  Ultimately, it was felt the patient was suffering with severe uncontrolled decompensated diastolic  congestive heart failure as well as poorly-controlled obstructive sleep apnea with obesity hypoventilation syndrome.  With marked diuresis and strict blood pressure control as well as strict compliance with the patient's BiPAP, he stabilize quite rapidly.  The patient has been counseled extensively as to the absolute need for strict compliance with his BiPAP.  It is explained to him that each night he goes without using his BiPAP will cause irreversible damage to the lungs and shorten his life.  He is also educated on the absolute need to discontinue tobacco abuse and to be strictly adhere to his medication regimen.  The patient has financial challenges, we have asked the social worker and the case manager to assist him in every way possible in arranging outpatient MD followup as well as assistance in improving his social situation.  During the initial portion of the hospital stay, the patient had a condom cath in place for strict I's and O's and due to his diminished mobility.  As he has improved, this has been removed.  Unfortunately, the patient has developed a paraphimosis related to this.  Today, Jun 25, 2010, the inspecting physician noted the paraphimosis and was able to replace the foreskin.  They continued to be a significant amount of swelling at the glans penis and just inferior and ventral to that.  This will need to be monitored closely.  At this point, however, the patient does not have a bed outside hospital to allow disposition, and therefore, he will remain in house anyway.  We will reassess this on physical exam prior to the patient's discharge.  PHYSICAL EXAMINATION:  VITAL SIGNS:  Temperature 98.2, blood pressure 119/77, heart rate 88, respiratory rate 18, and O2 sats 91% on 2 liters per minute nasal cannula. LUNGS:  Very distant breath sounds, but clear to auscultation bilaterally without focal crackles. CARDIOVASCULAR:  Very distant heart sounds, but regular  rate and rhythm without murmur, gallop, or rub with normal S1 and S2. ABDOMEN:  Morbidly obese, nontender, nondistended, soft.  Bowel sounds present.  No organomegaly, rebound, or ascites. EXTREMITIES:  2+ bilateral lower extremity edema without cyanosis or clubbing. GENITOURINARY:  The patient has an obvious paraphimosis.  His foreskin is able to be returned to his normal position without significant pain or difficulty.  A significant amount of edema remains on the ventral aspect of the penis just inferior to the glands.  There is some evidence of superficial scabbing and irritation here.  There is no purulent discharge.  There is no severe erythema or marked pain.  DISPOSITION:  As noted above.  The patient is presently medically stable.  We are simply awaiting a disposition environment  into which the patient can safely be discharged.     Lonia Blood, M.D.     JTM/MEDQ  D:  06/25/2010  T:  06/25/2010  Job:  [id number]  Electronically Signed by Jetty Duhamel M.D. on 07/16/2010 09:50:27 PM

## 2010-11-01 LAB — HEMOGLOBIN A1C
Hgb A1c MFr Bld: 6.1
Mean Plasma Glucose: 140

## 2010-11-01 LAB — RAPID URINE DRUG SCREEN, HOSP PERFORMED
Barbiturates: NOT DETECTED
Benzodiazepines: NOT DETECTED

## 2010-11-01 LAB — POCT CARDIAC MARKERS
CKMB, poc: 1.7
Myoglobin, poc: 79.6
Operator id: 229371
Troponin i, poc: <0.05

## 2010-11-01 LAB — DIFFERENTIAL
Basophils Absolute: 0
Basophils Absolute: 0.1
Basophils Relative: 0
Basophils Relative: 1
Eosinophils Absolute: 0.4
Eosinophils Relative: 3
Lymphocytes Relative: 28
Monocytes Absolute: 1.4 — ABNORMAL HIGH
Neutro Abs: 9.4 — ABNORMAL HIGH
Neutro Abs: 9.9 — ABNORMAL HIGH

## 2010-11-01 LAB — BLOOD GAS, ARTERIAL
Bicarbonate: 32.2 — ABNORMAL HIGH
Patient temperature: 98.6
TCO2: 27.5
pCO2 arterial: 51.4 — ABNORMAL HIGH
pH, Arterial: 7.413

## 2010-11-01 LAB — BASIC METABOLIC PANEL
CO2: 32
CO2: 38 — ABNORMAL HIGH
Calcium: 9.1
Calcium: 9.4
Chloride: 94 — ABNORMAL LOW
Chloride: 98
Creatinine, Ser: 0.85
Creatinine, Ser: 1.1
GFR calc Af Amer: >60
GFR calc non Af Amer: >60
Glucose, Bld: 107 — ABNORMAL HIGH
Potassium: 3.6
Sodium: 138

## 2010-11-01 LAB — URINE CULTURE: Special Requests: NEGATIVE

## 2010-11-01 LAB — D-DIMER, QUANTITATIVE: D-Dimer, Quant: 0.35

## 2010-11-01 LAB — CBC
HCT: 42.8
HCT: 44
HCT: 45.1
Hemoglobin: 14.8
Hemoglobin: 15
Hemoglobin: 15.4
MCHC: 34.2
MCHC: 34.3
MCV: 80.2
MCV: 80.6
Platelets: 307
Platelets: 323
RBC: 5.34
RBC: 5.4
RBC: 5.49
RDW: 14.9
RDW: 15
RDW: 15.2
WBC: 15.3 — ABNORMAL HIGH

## 2010-11-01 LAB — COMPREHENSIVE METABOLIC PANEL
ALT: 21
Albumin: 3.6
Alkaline Phosphatase: 61
BUN: 11
Chloride: 99
Potassium: 3.7
Sodium: 139
Total Bilirubin: 0.6

## 2010-11-01 LAB — LIPID PANEL
Cholesterol: 188
HDL: 26 — ABNORMAL LOW
LDL Cholesterol: 144 — ABNORMAL HIGH
Triglycerides: 89

## 2010-11-01 LAB — POCT I-STAT, CHEM 8
Calcium, Ion: 1.16
Creatinine, Ser: 1.1
Glucose, Bld: 120 — ABNORMAL HIGH
HCT: 46
Hemoglobin: 15.6

## 2010-11-01 LAB — URINALYSIS, ROUTINE W REFLEX MICROSCOPIC
Bilirubin Urine: NEGATIVE
Glucose, UA: NEGATIVE
Ketones, ur: NEGATIVE
Nitrite: NEGATIVE
Specific Gravity, Urine: 1.012
pH: 6

## 2010-11-01 LAB — CARDIAC PANEL(CRET KIN+CKTOT+MB+TROPI)
CK, MB: 1.4
CK, MB: 1.5
Relative Index: 1.1
Relative Index: 1.2
Troponin I: <0.01

## 2010-11-01 LAB — CULTURE, BLOOD (ROUTINE X 2)

## 2010-11-01 LAB — PROTIME-INR
INR: 1
Prothrombin Time: 13.5

## 2010-11-01 LAB — TSH: TSH: 3.33 (ref 0.350–4.500)

## 2010-11-01 LAB — B-NATRIURETIC PEPTIDE (CONVERTED LAB): Pro B Natriuretic peptide (BNP): <30

## 2010-11-06 LAB — DIFFERENTIAL
Basophils Absolute: 0.2 — ABNORMAL HIGH
Basophils Relative: 1
Lymphocytes Relative: 25
Monocytes Absolute: 1.3 — ABNORMAL HIGH
Neutro Abs: 9.7 — ABNORMAL HIGH
Neutrophils Relative %: 63

## 2010-11-06 LAB — CBC
Hemoglobin: 13.8
MCHC: 33.8
Platelets: 308
RDW: 16.3 — ABNORMAL HIGH

## 2010-11-06 LAB — BASIC METABOLIC PANEL
BUN: 9
CO2: 32
Calcium: 9.1
Creatinine, Ser: 0.85
GFR calc non Af Amer: >60
Glucose, Bld: 91

## 2010-11-06 LAB — D-DIMER, QUANTITATIVE: D-Dimer, Quant: 0.36

## 2010-11-06 LAB — POCT CARDIAC MARKERS: Myoglobin, poc: 84.6

## 2010-11-13 LAB — DIFFERENTIAL
Basophils Absolute: 0.1
Basophils Relative: 1
Eosinophils Absolute: 0.6
Monocytes Absolute: 1.8 — ABNORMAL HIGH
Monocytes Relative: 10
Neutrophils Relative %: 58

## 2010-11-13 LAB — I-STAT 8, (EC8 V) (CONVERTED LAB)
BUN: 12
Bicarbonate: 31 — ABNORMAL HIGH
Glucose, Bld: 96
Hemoglobin: 15.6
Sodium: 140
TCO2: 32
pCO2, Ven: 50

## 2010-11-13 LAB — CBC
MCHC: 34.2
MCV: 82.8
RBC: 5.12
RDW: 15.2 — ABNORMAL HIGH

## 2010-11-13 LAB — POCT I-STAT CREATININE: Operator id: 234501

## 2010-11-16 ENCOUNTER — Inpatient Hospital Stay (HOSPITAL_COMMUNITY)
Admission: EM | Admit: 2010-11-16 | Discharge: 2010-12-26 | DRG: 003 | Disposition: A | Discharge status: Skilled Nursing Facility | Payer: Medicare Other | Attending: Pulmonary Disease | Admitting: Pulmonary Disease

## 2010-11-16 ENCOUNTER — Emergency Department (HOSPITAL_COMMUNITY): Payer: Medicare Other

## 2010-11-16 DIAGNOSIS — E871 Hypo-osmolality and hyponatremia: Secondary | ICD-10-CM | POA: Diagnosis not present

## 2010-11-16 DIAGNOSIS — L89159 Pressure ulcer of sacral region, unspecified stage: Secondary | ICD-10-CM

## 2010-11-16 DIAGNOSIS — J449 Chronic obstructive pulmonary disease, unspecified: Secondary | ICD-10-CM | POA: Diagnosis present

## 2010-11-16 DIAGNOSIS — I10 Essential (primary) hypertension: Secondary | ICD-10-CM | POA: Diagnosis present

## 2010-11-16 DIAGNOSIS — I509 Heart failure, unspecified: Secondary | ICD-10-CM | POA: Diagnosis present

## 2010-11-16 DIAGNOSIS — N471 Phimosis: Secondary | ICD-10-CM | POA: Diagnosis present

## 2010-11-16 DIAGNOSIS — K5900 Constipation, unspecified: Secondary | ICD-10-CM | POA: Diagnosis not present

## 2010-11-16 DIAGNOSIS — R1310 Dysphagia, unspecified: Secondary | ICD-10-CM | POA: Diagnosis not present

## 2010-11-16 DIAGNOSIS — R7309 Other abnormal glucose: Secondary | ICD-10-CM | POA: Diagnosis present

## 2010-11-16 DIAGNOSIS — J9620 Acute and chronic respiratory failure, unspecified whether with hypoxia or hypercapnia: Secondary | ICD-10-CM | POA: Diagnosis not present

## 2010-11-16 DIAGNOSIS — N433 Hydrocele, unspecified: Secondary | ICD-10-CM | POA: Diagnosis present

## 2010-11-16 DIAGNOSIS — R197 Diarrhea, unspecified: Secondary | ICD-10-CM | POA: Diagnosis not present

## 2010-11-16 DIAGNOSIS — N498 Inflammatory disorders of other specified male genital organs: Principal | ICD-10-CM | POA: Diagnosis present

## 2010-11-16 DIAGNOSIS — E876 Hypokalemia: Secondary | ICD-10-CM | POA: Diagnosis not present

## 2010-11-16 DIAGNOSIS — N179 Acute kidney failure, unspecified: Secondary | ICD-10-CM | POA: Diagnosis not present

## 2010-11-16 DIAGNOSIS — D649 Anemia, unspecified: Secondary | ICD-10-CM | POA: Diagnosis not present

## 2010-11-16 DIAGNOSIS — F17200 Nicotine dependence, unspecified, uncomplicated: Secondary | ICD-10-CM | POA: Diagnosis present

## 2010-11-16 DIAGNOSIS — I959 Hypotension, unspecified: Secondary | ICD-10-CM | POA: Diagnosis not present

## 2010-11-16 DIAGNOSIS — L8993 Pressure ulcer of unspecified site, stage 3: Secondary | ICD-10-CM | POA: Diagnosis not present

## 2010-11-16 DIAGNOSIS — E662 Morbid (severe) obesity with alveolar hypoventilation: Secondary | ICD-10-CM | POA: Diagnosis present

## 2010-11-16 DIAGNOSIS — E870 Hyperosmolality and hypernatremia: Secondary | ICD-10-CM

## 2010-11-16 DIAGNOSIS — R5381 Other malaise: Secondary | ICD-10-CM | POA: Diagnosis present

## 2010-11-16 DIAGNOSIS — J4489 Other specified chronic obstructive pulmonary disease: Secondary | ICD-10-CM | POA: Diagnosis present

## 2010-11-16 DIAGNOSIS — Z6841 Body Mass Index (BMI) 40.0 and over, adult: Secondary | ICD-10-CM

## 2010-11-16 DIAGNOSIS — M1990 Unspecified osteoarthritis, unspecified site: Secondary | ICD-10-CM | POA: Diagnosis present

## 2010-11-16 DIAGNOSIS — E785 Hyperlipidemia, unspecified: Secondary | ICD-10-CM | POA: Diagnosis present

## 2010-11-16 DIAGNOSIS — G4733 Obstructive sleep apnea (adult) (pediatric): Secondary | ICD-10-CM | POA: Diagnosis present

## 2010-11-16 DIAGNOSIS — I5033 Acute on chronic diastolic (congestive) heart failure: Secondary | ICD-10-CM | POA: Diagnosis present

## 2010-11-16 DIAGNOSIS — D472 Monoclonal gammopathy: Secondary | ICD-10-CM | POA: Diagnosis present

## 2010-11-16 DIAGNOSIS — R509 Fever, unspecified: Secondary | ICD-10-CM | POA: Diagnosis not present

## 2010-11-16 DIAGNOSIS — L89309 Pressure ulcer of unspecified buttock, unspecified stage: Secondary | ICD-10-CM | POA: Diagnosis not present

## 2010-11-16 DIAGNOSIS — Z781 Physical restraint status: Secondary | ICD-10-CM | POA: Diagnosis not present

## 2010-11-16 HISTORY — DX: Essential (primary) hypertension: I10

## 2010-11-16 HISTORY — DX: Heart failure, unspecified: I50.9

## 2010-11-16 HISTORY — DX: Dependence on respirator (ventilator) status: Z99.11

## 2010-11-16 LAB — DIFFERENTIAL
Basophils Absolute: 0 10*3/uL (ref 0.0–0.1)
Eosinophils Relative: 2 % (ref 0–5)
Lymphocytes Relative: 24 % (ref 12–46)
Lymphs Abs: 4.7 10*3/uL — ABNORMAL HIGH (ref 0.7–4.0)
Monocytes Absolute: 1.5 10*3/uL — ABNORMAL HIGH (ref 0.1–1.0)
Monocytes Relative: 8 % (ref 3–12)
Neutro Abs: 13 10*3/uL — ABNORMAL HIGH (ref 1.7–7.7)

## 2010-11-16 LAB — CBC
HCT: 37.8 % — ABNORMAL LOW (ref 39.0–52.0)
Hemoglobin: 13.4 g/dL (ref 13.0–17.0)
MCH: 26 pg (ref 26.0–34.0)
MCHC: 35.4 g/dL (ref 30.0–36.0)
MCV: 73.4 fL — ABNORMAL LOW (ref 78.0–100.0)
RDW: 16 % — ABNORMAL HIGH (ref 11.5–15.5)

## 2010-11-17 ENCOUNTER — Emergency Department (HOSPITAL_COMMUNITY): Payer: Medicare Other

## 2010-11-17 DIAGNOSIS — J9600 Acute respiratory failure, unspecified whether with hypoxia or hypercapnia: Secondary | ICD-10-CM

## 2010-11-17 LAB — CARDIAC PANEL(CRET KIN+CKTOT+MB+TROPI)
CK, MB: 1.8 ng/mL (ref 0.3–4.0)
Troponin I: <0.3 ng/mL (ref <0.30)

## 2010-11-17 LAB — POCT I-STAT 3, ART BLOOD GAS (G3+)
Acid-Base Excess: 16 mmol/L — ABNORMAL HIGH (ref 0.0–2.0)
Patient temperature: 98.6
pH, Arterial: 7.425 (ref 7.350–7.450)

## 2010-11-17 LAB — BLOOD GAS, ARTERIAL
Drawn by: 321312
O2 Content: 3 L/min
O2 Saturation: 94.7 %
Patient temperature: 98.6
pH, Arterial: 7.312 — ABNORMAL LOW (ref 7.350–7.450)
pO2, Arterial: 76.7 mmHg — ABNORMAL LOW (ref 80.0–100.0)

## 2010-11-17 LAB — BASIC METABOLIC PANEL
BUN: 10 mg/dL (ref 6–23)
CO2: 41 mEq/L (ref 19–32)
Calcium: 9.5 mg/dL (ref 8.4–10.5)
Chloride: 86 mEq/L — ABNORMAL LOW (ref 96–112)
Creatinine, Ser: 0.7 mg/dL (ref 0.50–1.35)
Glucose, Bld: 302 mg/dL — ABNORMAL HIGH (ref 70–99)

## 2010-11-17 LAB — URINALYSIS, ROUTINE W REFLEX MICROSCOPIC
Bilirubin Urine: NEGATIVE
Glucose, UA: 100 mg/dL — AB
Ketones, ur: NEGATIVE mg/dL
Leukocytes, UA: NEGATIVE
Nitrite: NEGATIVE
Specific Gravity, Urine: 1.013 (ref 1.005–1.030)
pH: 5 (ref 5.0–8.0)

## 2010-11-17 LAB — GLUCOSE, CAPILLARY
Glucose-Capillary: 245 mg/dL — ABNORMAL HIGH (ref 70–99)
Glucose-Capillary: 227 mg/dL — ABNORMAL HIGH (ref 70–99)

## 2010-11-17 LAB — POCT I-STAT TROPONIN I

## 2010-11-17 LAB — PRO B NATRIURETIC PEPTIDE: Pro B Natriuretic peptide (BNP): 17.2 pg/mL (ref 0–125)

## 2010-11-18 ENCOUNTER — Inpatient Hospital Stay (HOSPITAL_COMMUNITY): Payer: Medicare Other

## 2010-11-18 LAB — CBC
HCT: 36.6 % — ABNORMAL LOW (ref 39.0–52.0)
Hemoglobin: 12.5 g/dL — ABNORMAL LOW (ref 13.0–17.0)
MCH: 25.5 pg — ABNORMAL LOW (ref 26.0–34.0)
MCV: 74.7 fL — ABNORMAL LOW (ref 78.0–100.0)
RBC: 4.9 MIL/uL (ref 4.22–5.81)
WBC: 18.5 10*3/uL — ABNORMAL HIGH (ref 4.0–10.5)

## 2010-11-18 LAB — POCT I-STAT 3, ART BLOOD GAS (G3+)
Acid-Base Excess: 14 mmol/L — ABNORMAL HIGH (ref 0.0–2.0)
Acid-Base Excess: 15 mmol/L — ABNORMAL HIGH (ref 0.0–2.0)
Bicarbonate: 42.7 mEq/L — ABNORMAL HIGH (ref 20.0–24.0)
O2 Saturation: 96 %
O2 Saturation: 97 %
O2 Saturation: 99 %
Patient temperature: 98.6
Patient temperature: 98.6
Patient temperature: 98.7
TCO2: 46 mmol/L (ref 0–100)
TCO2: 45 mmol/L (ref 0–100)
pCO2 arterial: 80.2 mmHg (ref 35.0–45.0)
pH, Arterial: 7.458 — ABNORMAL HIGH (ref 7.350–7.450)

## 2010-11-18 LAB — PRO B NATRIURETIC PEPTIDE: Pro B Natriuretic peptide (BNP): 22.1 pg/mL (ref 0–125)

## 2010-11-18 LAB — BASIC METABOLIC PANEL
BUN: 12 mg/dL (ref 6–23)
CO2: 40 mEq/L (ref 19–32)
Chloride: 91 mEq/L — ABNORMAL LOW (ref 96–112)
Creatinine, Ser: 0.79 mg/dL (ref 0.50–1.35)
GFR calc Af Amer: >90 mL/min (ref >90)
Glucose, Bld: 213 mg/dL — ABNORMAL HIGH (ref 70–99)
Potassium: 4.2 mEq/L (ref 3.5–5.1)

## 2010-11-18 LAB — HEPATIC FUNCTION PANEL
Alkaline Phosphatase: 82 U/L (ref 39–117)
Bilirubin, Direct: 0.1 mg/dL (ref 0.0–0.3)
Indirect Bilirubin: 0.4 mg/dL (ref 0.3–0.9)
Total Protein: 7 g/dL (ref 6.0–8.3)

## 2010-11-18 LAB — HEMOGLOBIN A1C: Hgb A1c MFr Bld: 9.5 % — ABNORMAL HIGH (ref <5.7)

## 2010-11-18 LAB — GLUCOSE, CAPILLARY
Glucose-Capillary: 169 mg/dL — ABNORMAL HIGH (ref 70–99)
Glucose-Capillary: 152 mg/dL — ABNORMAL HIGH (ref 70–99)

## 2010-11-18 LAB — LIPID PANEL
HDL: 35 mg/dL — ABNORMAL LOW (ref >39)
LDL Cholesterol: 77 mg/dL (ref 0–99)
Total CHOL/HDL Ratio: 4.1 RATIO
VLDL: 33 mg/dL (ref 0–40)

## 2010-11-18 LAB — CARDIAC PANEL(CRET KIN+CKTOT+MB+TROPI): Troponin I: <0.3 ng/mL (ref <0.30)

## 2010-11-19 ENCOUNTER — Inpatient Hospital Stay (HOSPITAL_COMMUNITY): Payer: Medicare Other

## 2010-11-19 DIAGNOSIS — A419 Sepsis, unspecified organism: Secondary | ICD-10-CM

## 2010-11-19 DIAGNOSIS — J9600 Acute respiratory failure, unspecified whether with hypoxia or hypercapnia: Secondary | ICD-10-CM

## 2010-11-19 DIAGNOSIS — R609 Edema, unspecified: Secondary | ICD-10-CM

## 2010-11-19 DIAGNOSIS — L0390 Cellulitis, unspecified: Secondary | ICD-10-CM

## 2010-11-19 DIAGNOSIS — L0291 Cutaneous abscess, unspecified: Secondary | ICD-10-CM

## 2010-11-19 DIAGNOSIS — M7989 Other specified soft tissue disorders: Secondary | ICD-10-CM

## 2010-11-19 DIAGNOSIS — R0602 Shortness of breath: Secondary | ICD-10-CM

## 2010-11-19 LAB — GLUCOSE, CAPILLARY
Glucose-Capillary: 126 mg/dL — ABNORMAL HIGH (ref 70–99)
Glucose-Capillary: 143 mg/dL — ABNORMAL HIGH (ref 70–99)
Glucose-Capillary: 151 mg/dL — ABNORMAL HIGH (ref 70–99)
Glucose-Capillary: 157 mg/dL — ABNORMAL HIGH (ref 70–99)
Glucose-Capillary: 180 mg/dL — ABNORMAL HIGH (ref 70–99)

## 2010-11-19 LAB — BASIC METABOLIC PANEL
BUN: 11 mg/dL (ref 6–23)
CO2: 36 mEq/L — ABNORMAL HIGH (ref 19–32)
Calcium: 8.9 mg/dL (ref 8.4–10.5)
Chloride: 94 mEq/L — ABNORMAL LOW (ref 96–112)
Creatinine, Ser: 0.83 mg/dL (ref 0.50–1.35)
GFR calc Af Amer: >90 mL/min (ref >90)

## 2010-11-19 LAB — CBC
HCT: 34.4 % — ABNORMAL LOW (ref 39.0–52.0)
MCH: 25.5 pg — ABNORMAL LOW (ref 26.0–34.0)
MCV: 73.2 fL — ABNORMAL LOW (ref 78.0–100.0)
Platelets: 271 10*3/uL (ref 150–400)
RDW: 16.4 % — ABNORMAL HIGH (ref 11.5–15.5)
WBC: 16.2 10*3/uL — ABNORMAL HIGH (ref 4.0–10.5)

## 2010-11-19 LAB — POCT I-STAT 3, ART BLOOD GAS (G3+)
Acid-Base Excess: 12 mmol/L — ABNORMAL HIGH (ref 0.0–2.0)
O2 Saturation: 98 %
TCO2: 41 mmol/L (ref 0–100)
pCO2 arterial: 63.7 mmHg (ref 35.0–45.0)
pO2, Arterial: 103 mmHg — ABNORMAL HIGH (ref 80.0–100.0)

## 2010-11-19 LAB — URINALYSIS, ROUTINE W REFLEX MICROSCOPIC
Glucose, UA: NEGATIVE mg/dL
Nitrite: NEGATIVE
Specific Gravity, Urine: 1.015 (ref 1.005–1.030)
pH: 8.5 — ABNORMAL HIGH (ref 5.0–8.0)

## 2010-11-19 LAB — URINE MICROSCOPIC-ADD ON

## 2010-11-19 LAB — MRSA PCR SCREENING: MRSA by PCR: POSITIVE — AB

## 2010-11-20 ENCOUNTER — Inpatient Hospital Stay (HOSPITAL_COMMUNITY): Payer: Medicare Other

## 2010-11-20 DIAGNOSIS — L0291 Cutaneous abscess, unspecified: Secondary | ICD-10-CM

## 2010-11-20 DIAGNOSIS — L0390 Cellulitis, unspecified: Secondary | ICD-10-CM

## 2010-11-20 DIAGNOSIS — N5089 Other specified disorders of the male genital organs: Secondary | ICD-10-CM

## 2010-11-20 DIAGNOSIS — R609 Edema, unspecified: Secondary | ICD-10-CM

## 2010-11-20 DIAGNOSIS — A419 Sepsis, unspecified organism: Secondary | ICD-10-CM

## 2010-11-20 DIAGNOSIS — J9600 Acute respiratory failure, unspecified whether with hypoxia or hypercapnia: Secondary | ICD-10-CM

## 2010-11-20 LAB — BASIC METABOLIC PANEL
BUN: 11 mg/dL (ref 6–23)
Chloride: 100 mEq/L (ref 96–112)
Chloride: 92 mEq/L — ABNORMAL LOW (ref 96–112)
GFR calc Af Amer: >90 mL/min (ref >90)
GFR calc non Af Amer: 89 mL/min — ABNORMAL LOW (ref >90)
Glucose, Bld: 121 mg/dL — ABNORMAL HIGH (ref 70–99)
Potassium: 4.8 mEq/L (ref 3.5–5.1)
Potassium: 3.2 mEq/L — ABNORMAL LOW (ref 3.5–5.1)
Sodium: 140 mEq/L (ref 135–145)

## 2010-11-20 LAB — GLUCOSE, CAPILLARY: Glucose-Capillary: 129 mg/dL — ABNORMAL HIGH (ref 70–99)

## 2010-11-20 LAB — BLOOD GAS, ARTERIAL
Bicarbonate: 31.6 mEq/L — ABNORMAL HIGH (ref 20.0–24.0)
TCO2: 33.4 mmol/L (ref 0–100)
pCO2 arterial: 58.4 mmHg (ref 35.0–45.0)
pH, Arterial: 7.352 (ref 7.350–7.450)

## 2010-11-20 LAB — URINE CULTURE

## 2010-11-20 LAB — CBC
HCT: 35.3 % — ABNORMAL LOW (ref 39.0–52.0)
Hemoglobin: 12.1 g/dL — ABNORMAL LOW (ref 13.0–17.0)
WBC: 17.2 10*3/uL — ABNORMAL HIGH (ref 4.0–10.5)

## 2010-11-21 ENCOUNTER — Inpatient Hospital Stay (HOSPITAL_COMMUNITY): Payer: Medicare Other

## 2010-11-21 LAB — CBC
HCT: 36.3 % — ABNORMAL LOW (ref 39.0–52.0)
Hemoglobin: 12.2 g/dL — ABNORMAL LOW (ref 13.0–17.0)
MCV: 72.9 fL — ABNORMAL LOW (ref 78.0–100.0)
Platelets: 259 10*3/uL (ref 150–400)
RBC: 4.98 MIL/uL (ref 4.22–5.81)
WBC: 18.3 10*3/uL — ABNORMAL HIGH (ref 4.0–10.5)

## 2010-11-21 LAB — BLOOD GAS, ARTERIAL
Acid-Base Excess: 13.7 mmol/L — ABNORMAL HIGH (ref 0.0–2.0)
Bicarbonate: 39 mEq/L — ABNORMAL HIGH (ref 20.0–24.0)
MECHVT: 500 mL
O2 Saturation: 95.4 %
Patient temperature: 100.5
TCO2: 41 mmol/L (ref 0–100)

## 2010-11-21 LAB — GLUCOSE, CAPILLARY
Glucose-Capillary: 204 mg/dL — ABNORMAL HIGH (ref 70–99)
Glucose-Capillary: 172 mg/dL — ABNORMAL HIGH (ref 70–99)
Glucose-Capillary: 187 mg/dL — ABNORMAL HIGH (ref 70–99)

## 2010-11-21 LAB — BASIC METABOLIC PANEL
Calcium: 9.5 mg/dL (ref 8.4–10.5)
Chloride: 92 mEq/L — ABNORMAL LOW (ref 96–112)
Creatinine, Ser: 1.02 mg/dL (ref 0.50–1.35)
GFR calc Af Amer: >90 mL/min (ref >90)
GFR calc non Af Amer: 87 mL/min — ABNORMAL LOW (ref >90)

## 2010-11-21 LAB — MAGNESIUM: Magnesium: 2 mg/dL (ref 1.5–2.5)

## 2010-11-22 ENCOUNTER — Inpatient Hospital Stay (HOSPITAL_COMMUNITY): Payer: Medicare Other

## 2010-11-22 DIAGNOSIS — A419 Sepsis, unspecified organism: Secondary | ICD-10-CM

## 2010-11-22 DIAGNOSIS — R609 Edema, unspecified: Secondary | ICD-10-CM

## 2010-11-22 DIAGNOSIS — L0291 Cutaneous abscess, unspecified: Secondary | ICD-10-CM

## 2010-11-22 DIAGNOSIS — L0390 Cellulitis, unspecified: Secondary | ICD-10-CM

## 2010-11-22 DIAGNOSIS — J9600 Acute respiratory failure, unspecified whether with hypoxia or hypercapnia: Secondary | ICD-10-CM

## 2010-11-22 LAB — GLUCOSE, CAPILLARY
Glucose-Capillary: 178 mg/dL — ABNORMAL HIGH (ref 70–99)
Glucose-Capillary: 205 mg/dL — ABNORMAL HIGH (ref 70–99)
Glucose-Capillary: 214 mg/dL — ABNORMAL HIGH (ref 70–99)

## 2010-11-22 LAB — BASIC METABOLIC PANEL
BUN: 21 mg/dL (ref 6–23)
Calcium: 10 mg/dL (ref 8.4–10.5)
Chloride: 89 mEq/L — ABNORMAL LOW (ref 96–112)
Creatinine, Ser: 1.13 mg/dL (ref 0.50–1.35)
GFR calc Af Amer: 89 mL/min — ABNORMAL LOW (ref >90)

## 2010-11-22 LAB — POCT I-STAT 3, ART BLOOD GAS (G3+)
Acid-Base Excess: 19 mmol/L — ABNORMAL HIGH (ref 0.0–2.0)
O2 Saturation: 95 %
Patient temperature: 100
TCO2: 49 mmol/L (ref 0–100)

## 2010-11-22 LAB — MAGNESIUM: Magnesium: 2 mg/dL (ref 1.5–2.5)

## 2010-11-22 LAB — CBC
HCT: 37.2 % — ABNORMAL LOW (ref 39.0–52.0)
MCH: 25.8 pg — ABNORMAL LOW (ref 26.0–34.0)
MCV: 74 fL — ABNORMAL LOW (ref 78.0–100.0)
Platelets: 244 10*3/uL (ref 150–400)
RDW: 16.3 % — ABNORMAL HIGH (ref 11.5–15.5)

## 2010-11-22 LAB — CULTURE, RESPIRATORY W GRAM STAIN

## 2010-11-22 LAB — PHOSPHORUS: Phosphorus: 4.3 mg/dL (ref 2.3–4.6)

## 2010-11-22 NOTE — Consult Note (Signed)
[name], [name] NO.:  [id number]  MEDICAL RECORD NO.:  [id number]  LOCATION:                                 FACILITY:  PHYSICIAN:  [name] Heckler, MD      DATE OF BIRTH:  1965/05/05  DATE OF CONSULTATION:  11/19/2010 DATE OF DISCHARGE:                                CONSULTATION   REQUESTING PHYSICIAN:  [name] Evener, MD  REASON FOR CONSULT:  Scrotal edema.  HISTORY OF PRESENT ILLNESS:  [name] is a 45 year old gentleman who is unable to give me any history right now because he is sedated on the vent due to respiratory failure.  Apparently, he presented with scrotal swelling and pain and is currently a resident of [facility] with an underlying history of heart failure, severe obstructive sleep apnea requiring BiPAP use, tobacco abuse, hypertension, hyperlipidemia, significant deconditioning due to severe obesity.  He reported worsening scrotal edema over the past week apparently after falling according to the history.  He has had worsening edema and presented for this.  He was seen in the emergency department where scrotal ultrasound was done showing bilateral hydroceles and mild skin thickening suggestive of maybe mild cellulitis.  No evidence of mass, torsion, or abscess was noted.  He has since gone into respiratory failure and has been intubated for this due to the worsening generalized edema.  The Critical Care Team is concerned for possible Fournier gangrene of the scrotal and perineal area due to the amount of edema and I have asked for surgical consultation.  PAST MEDICAL HISTORY:  As noted.  CURRENT MEDICATIONS:  Please see medication reconciliation list.  ALLERGIES:  TRAMADOL.  SOCIAL HISTORY:  Positive for smoking, occasional alcohol, positive marijuana use.  FAMILY HISTORY:  Cardiovascular disease.  PHYSICAL EXAMINATION:  GENERAL:  Morbidly obese African American male who is sedated on the vent.  He does  not respond to stimulus. ABDOMEN:  Diffusely edematous and obese abdomen, but is otherwise soft and benign. CHEST:  Multiple surgical scars are seen of the chest from prior superficial wounds. GENITALIA:  Exam of bilateral groins and suprapubic area reveals significant edema, but it is soft and nontender.  There is no subcu emphysema or erythema or skin changes.  Perineum is very soft.  There is no induration or again skin changes.  Perirectal area is soft, no evidence of abscess or induration.  The scrotum and penis are significantly edematous, but very soft.  There is thickening of the anterior skin of the scrotum, but no evidence of any skin necrosis. Mild superficial skin cracking is noted because of the edema.  There is phimosis of the foreskin secondary to edema, however, Foley is intact and functioning fine.  Unable to assess for any balanitis secondary to the phimosis.  LABORATORY DATA:  Labs today reveal a white blood cell count of 17.2, hemoglobin of 12.1, hematocrit of 35.3, platelet count of 258. Metabolic panel shows a sodium of 139, potassium of 4.8, creatinine of 0.93, glucose of 121, magnesium of 2.1, phosphorus of 4.5.  Again, imaging is pertaining to the ultrasound as noted and the patient is too large  to fit in the scanner for any further imaging.  IMPRESSION: 1. Significant generalized edema and respiratory failure. 2. Significant scrotal edema with bilateral hydroceles, however, no     evidence of Fournier gangrene.  We would actually question the     diagnosis of cellulitis by ultrasound as although the skin is     mildly thickened, there is no erythema, warmth, or tenderness to     this.  Our recommendation will be scrotal elevation, attempts at treatment of his generalized edema, though his respiratory failure is apparently very significant and apparently, there may be family discussions regarding goals of care.  Please contact us with any questions or  concerns.  Dr. Gerrit [name] has seen this patient with me and that we have discussed with the consulting MD.     [name] El, PA-C   ______________________________ [name] Heckler, MD    KB/MEDQ  D:  11/20/2010  T:  11/20/2010  Job:  [id number]  cc:   [name] Evener, MD  Electronically Signed by [name]  on 11/21/2010 03:54:28 PM Electronically Signed by [name] Level MD on 11/22/2010 09:17:09 AM

## 2010-11-23 ENCOUNTER — Inpatient Hospital Stay (HOSPITAL_COMMUNITY): Payer: Medicare Other

## 2010-11-23 LAB — BASIC METABOLIC PANEL WITH GFR
BUN: 40 mg/dL — ABNORMAL HIGH (ref 6–23)
CO2: 42 meq/L (ref 19–32)
Calcium: 9.7 mg/dL (ref 8.4–10.5)
Chloride: 95 meq/L — ABNORMAL LOW (ref 96–112)
Creatinine, Ser: 1.55 mg/dL — ABNORMAL HIGH (ref 0.50–1.35)
GFR calc Af Amer: 61 mL/min — ABNORMAL LOW
GFR calc non Af Amer: 52 mL/min — ABNORMAL LOW
Glucose, Bld: 217 mg/dL — ABNORMAL HIGH (ref 70–99)
Potassium: 2.8 meq/L — ABNORMAL LOW (ref 3.5–5.1)
Sodium: 146 meq/L — ABNORMAL HIGH (ref 135–145)

## 2010-11-23 LAB — CBC
HCT: 35.9 % — ABNORMAL LOW (ref 39.0–52.0)
Hemoglobin: 12.7 g/dL — ABNORMAL LOW (ref 13.0–17.0)
MCV: 72.7 fL — ABNORMAL LOW (ref 78.0–100.0)
RBC: 4.94 MIL/uL (ref 4.22–5.81)
RDW: 16.3 % — ABNORMAL HIGH (ref 11.5–15.5)
WBC: 20.7 10*3/uL — ABNORMAL HIGH (ref 4.0–10.5)

## 2010-11-23 LAB — BASIC METABOLIC PANEL
BUN: 30 mg/dL — ABNORMAL HIGH (ref 6–23)
BUN: 35 mg/dL — ABNORMAL HIGH (ref 6–23)
CO2: 44 mEq/L (ref 19–32)
Chloride: 90 mEq/L — ABNORMAL LOW (ref 96–112)
Chloride: 91 mEq/L — ABNORMAL LOW (ref 96–112)
Creatinine, Ser: 1.32 mg/dL (ref 0.50–1.35)
GFR calc Af Amer: 74 mL/min — ABNORMAL LOW (ref >90)
GFR calc Af Amer: 58 mL/min — ABNORMAL LOW (ref >90)
GFR calc non Af Amer: 64 mL/min — ABNORMAL LOW (ref >90)
Glucose, Bld: 227 mg/dL — ABNORMAL HIGH (ref 70–99)
Potassium: 2.9 mEq/L — ABNORMAL LOW (ref 3.5–5.1)

## 2010-11-23 LAB — BLOOD GAS, ARTERIAL
Acid-Base Excess: 17.7 mmol/L — ABNORMAL HIGH (ref 0.0–2.0)
Bicarbonate: 43.4 mEq/L — ABNORMAL HIGH (ref 20.0–24.0)
FIO2: 0.5 %
TCO2: 45.4 mmol/L (ref 0–100)
pCO2 arterial: 74.3 mmHg (ref 35.0–45.0)
pH, Arterial: 7.394 (ref 7.350–7.450)
pO2, Arterial: 83.9 mmHg (ref 80.0–100.0)

## 2010-11-23 LAB — PHOSPHORUS: Phosphorus: 3.8 mg/dL (ref 2.3–4.6)

## 2010-11-23 LAB — GLUCOSE, CAPILLARY
Glucose-Capillary: 215 mg/dL — ABNORMAL HIGH (ref 70–99)
Glucose-Capillary: 86 mg/dL (ref 70–99)
Glucose-Capillary: 216 mg/dL — ABNORMAL HIGH (ref 70–99)
Glucose-Capillary: 217 mg/dL — ABNORMAL HIGH (ref 70–99)
Glucose-Capillary: 224 mg/dL — ABNORMAL HIGH (ref 70–99)

## 2010-11-23 LAB — MAGNESIUM: Magnesium: 2.3 mg/dL (ref 1.5–2.5)

## 2010-11-24 ENCOUNTER — Inpatient Hospital Stay (HOSPITAL_COMMUNITY): Payer: Medicare Other

## 2010-11-24 LAB — BLOOD GAS, ARTERIAL
Acid-Base Excess: 11.4 mmol/L — ABNORMAL HIGH (ref 0.0–2.0)
FIO2: 0.4 %
O2 Saturation: 96.9 %
Patient temperature: 98.6
TCO2: 39 mmol/L (ref 0–100)
pH, Arterial: 7.371 (ref 7.350–7.450)

## 2010-11-24 LAB — CBC
MCH: 25.4 pg — ABNORMAL LOW (ref 26.0–34.0)
MCHC: 33.4 g/dL (ref 30.0–36.0)
Platelets: 234 10*3/uL (ref 150–400)
RDW: 16.6 % — ABNORMAL HIGH (ref 11.5–15.5)

## 2010-11-24 LAB — PHOSPHORUS: Phosphorus: 4.4 mg/dL (ref 2.3–4.6)

## 2010-11-24 LAB — BASIC METABOLIC PANEL
Calcium: 9.8 mg/dL (ref 8.4–10.5)
GFR calc Af Amer: 57 mL/min — ABNORMAL LOW (ref >90)
GFR calc non Af Amer: 49 mL/min — ABNORMAL LOW (ref >90)
Glucose, Bld: 227 mg/dL — ABNORMAL HIGH (ref 70–99)
Sodium: 146 mEq/L — ABNORMAL HIGH (ref 135–145)

## 2010-11-24 LAB — GLUCOSE, CAPILLARY
Glucose-Capillary: 203 mg/dL — ABNORMAL HIGH (ref 70–99)
Glucose-Capillary: 225 mg/dL — ABNORMAL HIGH (ref 70–99)

## 2010-11-24 LAB — MAGNESIUM: Magnesium: 2.7 mg/dL — ABNORMAL HIGH (ref 1.5–2.5)

## 2010-11-24 LAB — POCT I-STAT 3, ART BLOOD GAS (G3+)
Acid-Base Excess: 11 mmol/L — ABNORMAL HIGH (ref 0.0–2.0)
Bicarbonate: 40.3 mEq/L — ABNORMAL HIGH (ref 20.0–24.0)
Patient temperature: 101

## 2010-11-25 ENCOUNTER — Inpatient Hospital Stay (HOSPITAL_COMMUNITY): Payer: Medicare Other

## 2010-11-25 DIAGNOSIS — A419 Sepsis, unspecified organism: Secondary | ICD-10-CM

## 2010-11-25 DIAGNOSIS — R609 Edema, unspecified: Secondary | ICD-10-CM

## 2010-11-25 DIAGNOSIS — L0390 Cellulitis, unspecified: Secondary | ICD-10-CM

## 2010-11-25 DIAGNOSIS — L0291 Cutaneous abscess, unspecified: Secondary | ICD-10-CM

## 2010-11-25 DIAGNOSIS — J9600 Acute respiratory failure, unspecified whether with hypoxia or hypercapnia: Secondary | ICD-10-CM

## 2010-11-25 LAB — BASIC METABOLIC PANEL
CO2: 38 mEq/L — ABNORMAL HIGH (ref 19–32)
Calcium: 9.5 mg/dL (ref 8.4–10.5)
Chloride: 101 mEq/L (ref 96–112)
GFR calc Af Amer: 83 mL/min — ABNORMAL LOW (ref >90)
Sodium: 147 mEq/L — ABNORMAL HIGH (ref 135–145)

## 2010-11-25 LAB — BLOOD GAS, ARTERIAL
Bicarbonate: 37.3 mEq/L — ABNORMAL HIGH (ref 20.0–24.0)
PEEP: 5 cmH2O
pCO2 arterial: 77.3 mmHg (ref 35.0–45.0)
pH, Arterial: 7.305 — ABNORMAL LOW (ref 7.350–7.450)
pO2, Arterial: 330 mmHg — ABNORMAL HIGH (ref 80.0–100.0)

## 2010-11-25 LAB — CBC
Platelets: 231 10*3/uL (ref 150–400)
RBC: 4.47 MIL/uL (ref 4.22–5.81)
RDW: 16.5 % — ABNORMAL HIGH (ref 11.5–15.5)
WBC: 24.1 10*3/uL — ABNORMAL HIGH (ref 4.0–10.5)

## 2010-11-25 LAB — GLUCOSE, CAPILLARY
Glucose-Capillary: 187 mg/dL — ABNORMAL HIGH (ref 70–99)
Glucose-Capillary: 198 mg/dL — ABNORMAL HIGH (ref 70–99)
Glucose-Capillary: 196 mg/dL — ABNORMAL HIGH (ref 70–99)

## 2010-11-25 LAB — CULTURE, BLOOD (ROUTINE X 2)
Culture  Setup Time: 201210152120
Culture: NO GROWTH

## 2010-11-25 LAB — PHOSPHORUS: Phosphorus: 4.9 mg/dL — ABNORMAL HIGH (ref 2.3–4.6)

## 2010-11-25 LAB — MAGNESIUM: Magnesium: 2.6 mg/dL — ABNORMAL HIGH (ref 1.5–2.5)

## 2010-11-26 ENCOUNTER — Inpatient Hospital Stay (HOSPITAL_COMMUNITY): Payer: Medicare Other

## 2010-11-26 DIAGNOSIS — I509 Heart failure, unspecified: Secondary | ICD-10-CM

## 2010-11-26 LAB — BLOOD GAS, ARTERIAL
Acid-Base Excess: 12.9 mmol/L — ABNORMAL HIGH (ref 0.0–2.0)
Drawn by: 311011
MECHVT: 500 mL
O2 Saturation: 90.4 %
PEEP: 5 cmH2O
RATE: 16 resp/min
pO2, Arterial: 60.8 mmHg — ABNORMAL LOW (ref 80.0–100.0)

## 2010-11-26 LAB — CBC
Hemoglobin: 11 g/dL — ABNORMAL LOW (ref 13.0–17.0)
MCH: 25.5 pg — ABNORMAL LOW (ref 26.0–34.0)
MCHC: 33.4 g/dL (ref 30.0–36.0)
Platelets: 247 10*3/uL (ref 150–400)
RDW: 16.5 % — ABNORMAL HIGH (ref 11.5–15.5)

## 2010-11-26 LAB — GLUCOSE, CAPILLARY
Glucose-Capillary: 201 mg/dL — ABNORMAL HIGH (ref 70–99)
Glucose-Capillary: 195 mg/dL — ABNORMAL HIGH (ref 70–99)
Glucose-Capillary: 197 mg/dL — ABNORMAL HIGH (ref 70–99)
Glucose-Capillary: 167 mg/dL — ABNORMAL HIGH (ref 70–99)

## 2010-11-26 LAB — BASIC METABOLIC PANEL
Calcium: 9.6 mg/dL (ref 8.4–10.5)
GFR calc Af Amer: 86 mL/min — ABNORMAL LOW (ref >90)
GFR calc non Af Amer: 75 mL/min — ABNORMAL LOW (ref >90)
Glucose, Bld: 207 mg/dL — ABNORMAL HIGH (ref 70–99)
Potassium: 3.2 mEq/L — ABNORMAL LOW (ref 3.5–5.1)
Sodium: 151 mEq/L — ABNORMAL HIGH (ref 135–145)

## 2010-11-26 LAB — MAGNESIUM: Magnesium: 2.4 mg/dL (ref 1.5–2.5)

## 2010-11-26 LAB — PHOSPHORUS: Phosphorus: 3.2 mg/dL (ref 2.3–4.6)

## 2010-11-27 ENCOUNTER — Inpatient Hospital Stay (HOSPITAL_COMMUNITY): Payer: Medicare Other

## 2010-11-27 DIAGNOSIS — L0291 Cutaneous abscess, unspecified: Secondary | ICD-10-CM

## 2010-11-27 DIAGNOSIS — J9600 Acute respiratory failure, unspecified whether with hypoxia or hypercapnia: Secondary | ICD-10-CM

## 2010-11-27 DIAGNOSIS — R609 Edema, unspecified: Secondary | ICD-10-CM

## 2010-11-27 DIAGNOSIS — L0390 Cellulitis, unspecified: Secondary | ICD-10-CM

## 2010-11-27 DIAGNOSIS — A419 Sepsis, unspecified organism: Secondary | ICD-10-CM

## 2010-11-27 LAB — POCT I-STAT 3, ART BLOOD GAS (G3+)
Acid-Base Excess: 11 mmol/L — ABNORMAL HIGH (ref 0.0–2.0)
Patient temperature: 100.1
TCO2: 42 mmol/L (ref 0–100)
pH, Arterial: 7.31 — ABNORMAL LOW (ref 7.350–7.450)

## 2010-11-27 LAB — BASIC METABOLIC PANEL
BUN: 30 mg/dL — ABNORMAL HIGH (ref 6–23)
GFR calc Af Amer: >90 mL/min (ref >90)
GFR calc non Af Amer: 80 mL/min — ABNORMAL LOW (ref >90)
Potassium: 3.8 mEq/L (ref 3.5–5.1)
Sodium: 153 mEq/L — ABNORMAL HIGH (ref 135–145)

## 2010-11-27 LAB — CBC
HCT: 35.7 % — ABNORMAL LOW (ref 39.0–52.0)
MCHC: 32.5 g/dL (ref 30.0–36.0)
RDW: 16.8 % — ABNORMAL HIGH (ref 11.5–15.5)

## 2010-11-28 ENCOUNTER — Inpatient Hospital Stay (HOSPITAL_COMMUNITY): Payer: Medicare Other

## 2010-11-28 DIAGNOSIS — J984 Other disorders of lung: Secondary | ICD-10-CM

## 2010-11-28 LAB — GLUCOSE, CAPILLARY
Glucose-Capillary: 196 mg/dL — ABNORMAL HIGH (ref 70–99)
Glucose-Capillary: 195 mg/dL — ABNORMAL HIGH (ref 70–99)
Glucose-Capillary: 180 mg/dL — ABNORMAL HIGH (ref 70–99)
Glucose-Capillary: 183 mg/dL — ABNORMAL HIGH (ref 70–99)

## 2010-11-28 LAB — CBC
MCV: 78.3 fL (ref 78.0–100.0)
Platelets: 259 10*3/uL (ref 150–400)
RBC: 4.42 MIL/uL (ref 4.22–5.81)
WBC: 18 10*3/uL — ABNORMAL HIGH (ref 4.0–10.5)

## 2010-11-28 LAB — BASIC METABOLIC PANEL
CO2: 38 mEq/L — ABNORMAL HIGH (ref 19–32)
Chloride: 109 mEq/L (ref 96–112)
GFR calc Af Amer: 87 mL/min — ABNORMAL LOW (ref >90)
Potassium: 4.2 mEq/L (ref 3.5–5.1)
Sodium: 152 mEq/L — ABNORMAL HIGH (ref 135–145)

## 2010-11-28 LAB — MAGNESIUM: Magnesium: 2.1 mg/dL (ref 1.5–2.5)

## 2010-11-28 LAB — PHOSPHORUS: Phosphorus: 3.6 mg/dL (ref 2.3–4.6)

## 2010-11-29 ENCOUNTER — Inpatient Hospital Stay (HOSPITAL_COMMUNITY): Payer: Medicare Other

## 2010-11-29 LAB — BASIC METABOLIC PANEL
BUN: 33 mg/dL — ABNORMAL HIGH (ref 6–23)
CO2: 35 mEq/L — ABNORMAL HIGH (ref 19–32)
Calcium: 9.5 mg/dL (ref 8.4–10.5)
Creatinine, Ser: 1.19 mg/dL (ref 0.50–1.35)
Glucose, Bld: 167 mg/dL — ABNORMAL HIGH (ref 70–99)

## 2010-11-29 LAB — BLOOD GAS, ARTERIAL
Drawn by: 340271
MECHVT: 500 mL
PEEP: 5 cmH2O
Patient temperature: 98.6
RATE: 16 resp/min
TCO2: 36.9 mmol/L (ref 0–100)
pH, Arterial: 7.312 — ABNORMAL LOW (ref 7.350–7.450)

## 2010-11-29 LAB — CBC
HCT: 32.7 % — ABNORMAL LOW (ref 39.0–52.0)
Hemoglobin: 10.9 g/dL — ABNORMAL LOW (ref 13.0–17.0)
MCH: 25.8 pg — ABNORMAL LOW (ref 26.0–34.0)
MCHC: 33.3 g/dL (ref 30.0–36.0)
RDW: 16.7 % — ABNORMAL HIGH (ref 11.5–15.5)

## 2010-11-29 LAB — CULTURE, BLOOD (ROUTINE X 2)
Culture  Setup Time: 201210192113
Culture: NO GROWTH

## 2010-11-29 LAB — CULTURE, RESPIRATORY W GRAM STAIN

## 2010-11-29 LAB — GLUCOSE, CAPILLARY: Glucose-Capillary: 157 mg/dL — ABNORMAL HIGH (ref 70–99)

## 2010-11-29 LAB — URINE CULTURE: Culture  Setup Time: 201210231835

## 2010-11-30 ENCOUNTER — Inpatient Hospital Stay (HOSPITAL_COMMUNITY): Payer: Medicare Other

## 2010-11-30 DIAGNOSIS — L0291 Cutaneous abscess, unspecified: Secondary | ICD-10-CM

## 2010-11-30 DIAGNOSIS — J9600 Acute respiratory failure, unspecified whether with hypoxia or hypercapnia: Secondary | ICD-10-CM

## 2010-11-30 DIAGNOSIS — L0390 Cellulitis, unspecified: Secondary | ICD-10-CM

## 2010-11-30 DIAGNOSIS — R609 Edema, unspecified: Secondary | ICD-10-CM

## 2010-11-30 DIAGNOSIS — A419 Sepsis, unspecified organism: Secondary | ICD-10-CM

## 2010-11-30 LAB — URINALYSIS, ROUTINE W REFLEX MICROSCOPIC
Glucose, UA: NEGATIVE mg/dL
Ketones, ur: 15 mg/dL — AB
Protein, ur: 100 mg/dL — AB
Urobilinogen, UA: 1 mg/dL (ref 0.0–1.0)

## 2010-11-30 LAB — CULTURE, BLOOD (ROUTINE X 2): Culture  Setup Time: 201210240020

## 2010-11-30 LAB — BASIC METABOLIC PANEL
BUN: 38 mg/dL — ABNORMAL HIGH (ref 6–23)
Calcium: 9.5 mg/dL (ref 8.4–10.5)
GFR calc Af Amer: 67 mL/min — ABNORMAL LOW (ref >90)
GFR calc non Af Amer: 58 mL/min — ABNORMAL LOW (ref >90)
Glucose, Bld: 170 mg/dL — ABNORMAL HIGH (ref 70–99)
Potassium: 3.2 mEq/L — ABNORMAL LOW (ref 3.5–5.1)
Sodium: 149 mEq/L — ABNORMAL HIGH (ref 135–145)

## 2010-11-30 LAB — CBC
HCT: 34.1 % — ABNORMAL LOW (ref 39.0–52.0)
MCH: 25.4 pg — ABNORMAL LOW (ref 26.0–34.0)
MCHC: 33.1 g/dL (ref 30.0–36.0)
RDW: 16.7 % — ABNORMAL HIGH (ref 11.5–15.5)

## 2010-11-30 LAB — PROTIME-INR: Prothrombin Time: 15 seconds (ref 11.6–15.2)

## 2010-11-30 LAB — BLOOD GAS, ARTERIAL
Bicarbonate: 37.2 mEq/L — ABNORMAL HIGH (ref 20.0–24.0)
O2 Saturation: 88.6 %
PEEP: 5 cmH2O
pO2, Arterial: 64.4 mmHg — ABNORMAL LOW (ref 80.0–100.0)

## 2010-11-30 LAB — URINE MICROSCOPIC-ADD ON

## 2010-11-30 LAB — GLUCOSE, CAPILLARY
Glucose-Capillary: 169 mg/dL — ABNORMAL HIGH (ref 70–99)
Glucose-Capillary: 209 mg/dL — ABNORMAL HIGH (ref 70–99)
Glucose-Capillary: 180 mg/dL — ABNORMAL HIGH (ref 70–99)
Glucose-Capillary: 194 mg/dL — ABNORMAL HIGH (ref 70–99)

## 2010-11-30 LAB — GRAM STAIN

## 2010-11-30 NOTE — Consult Note (Signed)
  [name], SCHETTER NO.:  [id number]  MEDICAL RECORD NO.:  [id number]  LOCATION:  2902                         FACILITY:  [facility]  PHYSICIAN:  Gloris Manchester. Lazarus Salines, M.D. DATE OF BIRTH:  11/18/1965  DATE OF CONSULTATION:  11/29/2010 DATE OF DISCHARGE:                                CONSULTATION   CHIEF COMPLAINT:  Prolonged intubation.  HISTORY OF PRESENT ILLNESS:  A 45 year old black male admitted to the hospital with scrotal and penile edema.  He was felt not to have Fournier gangrene.  He did develop congestive failure.  He has known obesity hypoventilation syndrome and obstructive sleep apnea syndrome. He required intubation roughly 10 days ago.  He self extubated himself approximately 5 days ago and had a rapid deterioration.  He was in extremely difficult re-intubation.  He has been kept on the ventilator and heavy sedation since that time to avoid repeated self-extubation. Otolaryngology was called with assistance for tracheostomy.  EXAMINATION:  This is a sedated moderately short and massively obese adult black male.  He has an orotracheal tube in place and is on the ventilator.  He does not respond to conversation.  He has very thick rolls of fat in his neck and basically no visible neck.  The lower neck palpably is anatomically normal.  IMPRESSION:  Morbid obesity.  Obesity hypoventilation.  Obstructive sleep apnea.  Congestive failure.  Prolonged intubation.  PLAN:  He is on schedule for tracheostomy tomorrow.  We will need to use an extended length cuffed tube to provide pressure support afterwards. I discussed this with his aunt and uncle.  Consent has been obtained. Orders were written.  Given long-standing obesity, obesity hypoventilation, and sleep apnea, the tracheostomy tube may be a semi permanent fixture in improving his ventilation, evacuation of carbon dioxide, and overall support until he has an opportunity to lose substantial  weight.     Gloris Manchester. Lazarus Salines, M.D.     KTW/MEDQ  D:  11/29/2010  T:  11/29/2010  Job:  [id number]  cc:   __________Dr. Molli Knock  Electronically Signed by Flo Shanks M.D. on 11/30/2010 03:37:25 PM

## 2010-12-01 ENCOUNTER — Inpatient Hospital Stay (HOSPITAL_COMMUNITY): Payer: Medicare Other

## 2010-12-01 DIAGNOSIS — I5033 Acute on chronic diastolic (congestive) heart failure: Secondary | ICD-10-CM

## 2010-12-01 DIAGNOSIS — J9600 Acute respiratory failure, unspecified whether with hypoxia or hypercapnia: Secondary | ICD-10-CM

## 2010-12-01 DIAGNOSIS — R609 Edema, unspecified: Secondary | ICD-10-CM

## 2010-12-01 DIAGNOSIS — L0390 Cellulitis, unspecified: Secondary | ICD-10-CM

## 2010-12-01 DIAGNOSIS — L0291 Cutaneous abscess, unspecified: Secondary | ICD-10-CM

## 2010-12-01 DIAGNOSIS — A419 Sepsis, unspecified organism: Secondary | ICD-10-CM

## 2010-12-01 LAB — CBC
HCT: 32.4 % — ABNORMAL LOW (ref 39.0–52.0)
MCHC: 33.3 g/dL (ref 30.0–36.0)
Platelets: 207 10*3/uL (ref 150–400)
RDW: 16.6 % — ABNORMAL HIGH (ref 11.5–15.5)
WBC: 20.1 10*3/uL — ABNORMAL HIGH (ref 4.0–10.5)

## 2010-12-01 LAB — COMPREHENSIVE METABOLIC PANEL
AST: 187 U/L — ABNORMAL HIGH (ref 0–37)
Albumin: 2.6 g/dL — ABNORMAL LOW (ref 3.5–5.2)
Alkaline Phosphatase: 47 U/L (ref 39–117)
BUN: 54 mg/dL — ABNORMAL HIGH (ref 6–23)
Chloride: 102 mEq/L (ref 96–112)
Potassium: 3.3 mEq/L — ABNORMAL LOW (ref 3.5–5.1)
Total Protein: 7 g/dL (ref 6.0–8.3)

## 2010-12-01 LAB — BLOOD GAS, ARTERIAL
Acid-Base Excess: 10.6 mmol/L — ABNORMAL HIGH (ref 0.0–2.0)
Drawn by: 347621
FIO2: 0.5 %
MECHVT: 500 mL
Patient temperature: 102
RATE: 26 resp/min
TCO2: 37.6 mmol/L (ref 0–100)
pH, Arterial: 7.367 (ref 7.350–7.450)

## 2010-12-01 LAB — GLUCOSE, CAPILLARY
Glucose-Capillary: 208 mg/dL — ABNORMAL HIGH (ref 70–99)
Glucose-Capillary: 209 mg/dL — ABNORMAL HIGH (ref 70–99)
Glucose-Capillary: 229 mg/dL — ABNORMAL HIGH (ref 70–99)
Glucose-Capillary: 198 mg/dL — ABNORMAL HIGH (ref 70–99)
Glucose-Capillary: 217 mg/dL — ABNORMAL HIGH (ref 70–99)

## 2010-12-01 LAB — PRO B NATRIURETIC PEPTIDE: Pro B Natriuretic peptide (BNP): 32.4 pg/mL (ref 0–125)

## 2010-12-02 LAB — DIFFERENTIAL
Basophils Relative: 0 % (ref 0–1)
Lymphocytes Relative: 16 % (ref 12–46)
Lymphs Abs: 3.2 10*3/uL (ref 0.7–4.0)
Monocytes Absolute: 2.2 10*3/uL — ABNORMAL HIGH (ref 0.1–1.0)
Monocytes Relative: 11 % (ref 3–12)
Neutro Abs: 14.7 10*3/uL — ABNORMAL HIGH (ref 1.7–7.7)
Neutrophils Relative %: 72 % (ref 43–77)

## 2010-12-02 LAB — BASIC METABOLIC PANEL
BUN: 69 mg/dL — ABNORMAL HIGH (ref 6–23)
CO2: 35 mEq/L — ABNORMAL HIGH (ref 19–32)
Calcium: 9.2 mg/dL (ref 8.4–10.5)
Glucose, Bld: 219 mg/dL — ABNORMAL HIGH (ref 70–99)
Sodium: 144 mEq/L (ref 135–145)

## 2010-12-02 LAB — POCT I-STAT 3, ART BLOOD GAS (G3+)
Acid-Base Excess: 10 mmol/L — ABNORMAL HIGH (ref 0.0–2.0)
Bicarbonate: 38 mEq/L — ABNORMAL HIGH (ref 20.0–24.0)
O2 Saturation: 93 %
TCO2: 40 mmol/L (ref 0–100)
pCO2 arterial: 72.9 mmHg (ref 35.0–45.0)
pO2, Arterial: 76 mmHg — ABNORMAL LOW (ref 80.0–100.0)

## 2010-12-02 LAB — CBC
HCT: 31.5 % — ABNORMAL LOW (ref 39.0–52.0)
Hemoglobin: 10.5 g/dL — ABNORMAL LOW (ref 13.0–17.0)
MCH: 25.1 pg — ABNORMAL LOW (ref 26.0–34.0)
MCV: 75.2 fL — ABNORMAL LOW (ref 78.0–100.0)
RBC: 4.19 MIL/uL — ABNORMAL LOW (ref 4.22–5.81)

## 2010-12-02 LAB — GLUCOSE, CAPILLARY
Glucose-Capillary: 221 mg/dL — ABNORMAL HIGH (ref 70–99)
Glucose-Capillary: 231 mg/dL — ABNORMAL HIGH (ref 70–99)

## 2010-12-02 LAB — CULTURE, ROUTINE-GENITAL

## 2010-12-02 NOTE — H&P (Signed)
NAMEJORRELL, KUSTER NO.:  [id number]  MEDICAL RECORD NO.:  [id number]  LOCATION:                                 FACILITY:  PHYSICIAN:  Kela Millin, M.D.DATE OF BIRTH:  04-Apr-1965  DATE OF ADMISSION:  11/16/2010 DATE OF DISCHARGE:                             HISTORY & PHYSICAL   PRIMARY CARE PHYSICIAN:  Oswald Hillock, MD  CHIEF COMPLAINT:  Worsening of scrotal swelling and pain.  HISTORY OF PRESENT ILLNESS:  The patient is a 45 year old morbidly obese, resident of [facility] with a history of chronic diastolic CHF, severe sleep apnea with obesity hypoventilation syndrome on BiPAP outpatient, tobacco abuse, hypertension, dyslipidemia, osteoarthritis, and deconditioning who presents with above complaints. He states that he has had worsening scrotal swelling over the past 1 week, along with increasing pain.  The patient reports that he fell at the facility about 1 week ago and has had these symptoms since then.  He states that he was started on antibiotics per his nursing facility but the swelling and pain have continued to worsen.  The patient states that sometimes he has trouble urinating because of all the swelling that he has and has to be in the squatting position before he can urinate.  The patient also states that because he is unable to reach down to clean his anal area well after a bowel movement, he sometimes has to use the towel to rub back and forth in an effort to clean up that area and that has caused the skin in that area to peel off from time to time when he does that.  He states that even just his underwear rubbing against his scrotum caused pain.  He denies fevers, he also denies any penile discharge.  The patient denies cough, and states that he stays on oxygen at the nursing facility and has not had a change in shortness of breath.  He was seen in the ED and a scrotal ultrasound was done and showed bilateral  hydroceles, greater on the right.  Skin thickening, edema, and hyperemia in the scrotum, greatest on the left.  Findings suggest inflammatory process such as cellulitis.  No evidence of testicular mass or torsion.  He also had a chest x-ray which showed a shallow inspiration with cardiac enlargement.  Her brain natriuretic peptide came back normal at 17.2 and a urinalysis was done and was negative for infection.  His white cell count was elevated at 19.6.  He is admitted for further evaluation and management.  PAST MEDICAL HISTORY: 1. As above. 2. History of marijuana abuse. 3. History of acute-on-chronic diastolic CHF. 4. History of angioedema, reaction to tramadol. 5. Prior history of cocaine abuse - last use in 2010.  MEDICATIONS: 1. Vicodin 5/500 q.4 h. p.r.n. 2. Tylenol Extra Strength 2 tablets t.i.d. p.r.n. 3. Artificial Tears 1 drop q.4 h. p.r.n. 4. Albuterol nebs q.3 h. p.r.n. 5. Torsemide 20 mg 3 tablets b.i.d. 6. Cipro 500 mg p.o. daily. 7. Milk of magnesia 30 mL daily p.r.n. 8. Simvastatin 20 mg p.o. at bedtime. 9. Mucinex 600 mg 2 tablets b.i.d. 10.KCl 20 mEq 1 p.o. b.i.d. 11.Flonase 1 spray  daily. 12.Docusate 100 mg p.o. b.i.d. 13.Losartan 50 mg p.o. daily. 14.Loratadine 10 mg p.o. daily. 15.Spiriva 18 mcg 1 q.a.m. 16.Norvasc 5 mg p.o. q.a.m.  ALLERGIES:  TRAMADOL.  SOCIAL HISTORY:  Positive for tobacco.  He states he has cut down to 3-4 cigarettes per day.  Occasional alcohol.  Positive for marijuana, last use was in mid September.  He denies cocaine use.  FAMILY HISTORY:  Mother with heart problems and a known cancer.  Father was killed by stabbing at age 80.  REVIEW OF SYSTEMS:  As per HPI, other review of systems are negative.  PHYSICAL EXAMINATION:  GENERAL:  The patient is a morbidly obese black male.  He is alert and oriented x3, in no respiratory distress. VITAL SIGNS:  His temperature is 97.7 with a blood pressure of 139/84, pulse of 97,  respiratory rate of 27, O2 sat of 93%. HEENT:  PERRL, EOMI, moist mucous membranes.  No oral exudates. NECK:  Supple, no adenopathy, no thyromegaly, obese, and no JVD appreciated. LUNGS:  Decreased breath sounds at the bases, no crackles or wheezes. CARDIOVASCULAR:  Distant heart sounds, regular, normal S1 and S2. ABDOMEN:  Obese, bowel sounds present, nontender, nondistended.  No organomegaly and no masses palpable. GU:  Markedly edematous scrotum with mild diffuse tenderness, but noted to be more tender inferiorly and with areas of desquamating/chafing skin.  His foreskin is markedly edematous as well and it cannot be pulled back over the penis and tender.  No penile discharge. EXTREMITIES:  Nonpitting edema. NEUROLOGIC:  He is alert and oriented x3.  Cranial nerves II through XII grossly intact.  Nonfocal exam.  LABORATORY DATA:  Ultrasound as above.  Also, her urinalysis is negative for infection.  His white cell count is 19.6 with a hemoglobin of 13.4, hematocrit of 37.8, platelet count is 331, neutrophil count of 66%. Cardiac enzymes negative x1.  His sodium is 135 with a potassium of 3.5, chloride 86, CO2 of 41, glucose 302, BUN is 10 with a creatinine of0.70, calcium is 9.5.  Brain natriuretic peptide 17.2, and urinalysis is negative for infection.  IMAGING STUDIES:  As per HPI.  ASSESSMENT AND PLAN: 1. Scrotal and foreskin cellulitis with phimosis - as discussed above     and abdominal ultrasound revealing bilateral hydroceles and     findings consistent with cellulitis and no testicular mass or     torsion.  He does not appeared to have a Fournier gangrene at this     time per physical exam findings, but we will obtain a CT scan of     abdomen and pelvis to further evaluate.  Start on empiric     antibiotics with Cipro and vancomycin.  I will follow and consider     urologic versus surgical consultation pending studies and clinical     course. 2. Elevated blood glucose -  he has no prior history of diabetes     mellitus.  We will obtain a hemoglobin A1c.  I will monitor Accu-     Cheks, cover with sliding scale.  Follow and further manage as     appropriate. 3. Obstructive sleep apnea with obesity hypoventilation syndrome - we     will continue BiPAP at night and supplemental oxygen. 4. Hypertension - continue outpatient medications. 5. Chronic diastolic congestive heart failure- we will continue     diuretics but we will decrease the dose of the torsemide at this     point as he has some contraction alkalosis  and his brain     natriuretic peptide is 17, monitor closely and further manage as     clinically appropriate. 6. Tobacco abuse - smoking cessation consult - marijuana abuse -     social work consult for resources to quit. 7. Dyslipidemia - continue Zocor. 8. Deconditioning - PT, OT consults.     Kela Millin, M.D.     ACV/MEDQ  D:  11/17/2010  T:  11/17/2010  Job:  [id number]  cc:   Oswald Hillock, MD  Electronically Signed by Donnalee Curry M.D. on 12/02/2010 03:59:57 PM

## 2010-12-03 ENCOUNTER — Inpatient Hospital Stay (HOSPITAL_COMMUNITY): Payer: Medicare Other

## 2010-12-03 LAB — BASIC METABOLIC PANEL
Calcium: 8.8 mg/dL (ref 8.4–10.5)
Creatinine, Ser: 1.74 mg/dL — ABNORMAL HIGH (ref 0.50–1.35)
GFR calc Af Amer: 53 mL/min — ABNORMAL LOW (ref >90)

## 2010-12-03 LAB — CBC
MCH: 25.3 pg — ABNORMAL LOW (ref 26.0–34.0)
MCH: 24.9 pg — ABNORMAL LOW (ref 26.0–34.0)
MCHC: 33 g/dL (ref 30.0–36.0)
MCV: 75.7 fL — ABNORMAL LOW (ref 78.0–100.0)
MCV: 75.4 fL — ABNORMAL LOW (ref 78.0–100.0)
Platelets: 162 10*3/uL (ref 150–400)
Platelets: 222 10*3/uL (ref 150–400)
RDW: 16.8 % — ABNORMAL HIGH (ref 11.5–15.5)
RDW: 16.6 % — ABNORMAL HIGH (ref 11.5–15.5)

## 2010-12-03 LAB — GLUCOSE, CAPILLARY
Glucose-Capillary: 251 mg/dL — ABNORMAL HIGH (ref 70–99)
Glucose-Capillary: 238 mg/dL — ABNORMAL HIGH (ref 70–99)
Glucose-Capillary: 185 mg/dL — ABNORMAL HIGH (ref 70–99)
Glucose-Capillary: 190 mg/dL — ABNORMAL HIGH (ref 70–99)
Glucose-Capillary: 225 mg/dL — ABNORMAL HIGH (ref 70–99)

## 2010-12-04 ENCOUNTER — Inpatient Hospital Stay (HOSPITAL_COMMUNITY): Payer: Medicare Other

## 2010-12-04 DIAGNOSIS — L0390 Cellulitis, unspecified: Secondary | ICD-10-CM

## 2010-12-04 DIAGNOSIS — J9600 Acute respiratory failure, unspecified whether with hypoxia or hypercapnia: Secondary | ICD-10-CM

## 2010-12-04 DIAGNOSIS — L0291 Cutaneous abscess, unspecified: Secondary | ICD-10-CM

## 2010-12-04 LAB — BASIC METABOLIC PANEL
CO2: 38 mEq/L — ABNORMAL HIGH (ref 19–32)
Calcium: 9.4 mg/dL (ref 8.4–10.5)
Creatinine, Ser: 1.27 mg/dL (ref 0.50–1.35)

## 2010-12-04 LAB — GLUCOSE, CAPILLARY
Glucose-Capillary: 197 mg/dL — ABNORMAL HIGH (ref 70–99)
Glucose-Capillary: 195 mg/dL — ABNORMAL HIGH (ref 70–99)
Glucose-Capillary: 273 mg/dL — ABNORMAL HIGH (ref 70–99)
Glucose-Capillary: 195 mg/dL — ABNORMAL HIGH (ref 70–99)
Glucose-Capillary: 190 mg/dL — ABNORMAL HIGH (ref 70–99)

## 2010-12-04 LAB — CULTURE, BLOOD (ROUTINE X 2)

## 2010-12-04 LAB — CBC
Hemoglobin: 9.4 g/dL — ABNORMAL LOW (ref 13.0–17.0)
MCH: 24.5 pg — ABNORMAL LOW (ref 26.0–34.0)
MCHC: 32.4 g/dL (ref 30.0–36.0)
RDW: 16.6 % — ABNORMAL HIGH (ref 11.5–15.5)

## 2010-12-04 NOTE — Consult Note (Signed)
NAMEJONUEL, [name] NO.:  [id number]  MEDICAL RECORD NO.:  [id number]  LOCATION:  2902                         FACILITY:  [facility]  PHYSICIAN:  Rollene Rotunda, MD, FACCDATE OF BIRTH:  February 19, 1965  DATE OF CONSULTATION:  11/26/2010 DATE OF DISCHARGE:                                CONSULTATION   PRIMARY CARDIOLOGIST:  New to [facility] being seen by Dr. Rollene Rotunda.  REQUESTING PHYSICIAN:  Felipa Evener, MD  PATIENT PROFILE:  A 45 year old African American male with history of severe sleep apnea, obesity hypoventilation syndrome and chronic diastolic CHF, presented with respiratory failure requiring intubation subsequently developed SIRS.  We have been asked to evaluate for ongoing right heart failure.  PROBLEMS: 1. Acute ventilator-dependent respiratory failure. 2. Right heart failure. 3. Severe sleep apnea. 4. Obesity hypoventilation syndrome. 5. Presumed pulmonary arterial hypertension related to above. 6. Hypertension. 7. Hyperlipidemia. 8. History of marijuana and cocaine abuse (2010). 9. Scrotal swelling and phimosis seen by Urology.  ALLERGIES:  TRAMADOL caused angioedema.  HISTORY OF PRESENT ILLNESS:  A 45 year old obese African American male with the above complex problem list.  The patient was admitted to [facility] on November 16, 2010, secondary to progressive scrotal swelling and phimosis and subsequently respiratory failure requiring intubation.  The patient's BNP has been within normal limits, so he has had vascular congestion on chest x-ray and has had marked abdominal and lower extremity swelling suggestive of right heart failure in the setting of known history of sleep apnea and presumed pulmonary hypertension.  The patient was initially aggressively diuresed with rise in BUN and creatinine, as well as bicarb on November 23, 2010, and November 24, 2010. His renal function has since improved and diuresis has been  resumed today.  Despite diuresis, the patient continued to have markedly elevated central venous pressures in the 20s and 30s, presumably secondary to pulmonary hypertension and right heart failure.  The patient has self-extubated at least twice and has been a difficult re- intubation and at this point, plan is for tracheostomy once family can be contacted.  We have been asked to evaluate the patient secondary to ongoing edema and right heart failure.  The patient is currently intubated and sedated.  CURRENT MEDICATION: 1. Diamox 150 mg IV q.8 hours. 2. Cefepime 2 g IV q.12 hours. 3. Flonase 1 spray each nostril daily. 4. Lasix 40 mg IV q.6 hours. 5. Guaifenesin 30 mL via tube q.6 hours. 6. Heparin 5000 units subcu q.8 hours. 7. NovoLog sliding scale. 8. Lantus 15 units daily. 9. Reglan 5 mg IV q.6 hours before meals. 10.Protonix 40 mg daily. 11.Potassium chloride 40 mEq q.6 hours. 12.Protein supplement 30 mL via tube q.4 hours. 13.Vancomycin 1250 mg q.12 hours.  FAMILY HISTORY:  Unable to obtain from the patient but per report mother has a history of heart disease and father died with history of stabbing. No known history of siblings.  SOCIAL HISTORY:  The patient lives in [place] in a [facility].  There is no reported history of tobacco or alcohol.  There is no prior history of marijuana and cocaine abuse in 2010.  REVIEW OF SYSTEMS:  The  patient is intubated and sedated and therefore unable to fully assess.  PHYSICAL EXAMINATION:  VITAL SIGNS:  Temperature 101.8, heart rate 125, respirations 22, blood pressure 131/90, pulse ox 96%.  He is intubated and is on a ventilator with an FiO2 of 40%. GENERAL:  Sedated African American male, although he does open his eyes upon touch.  He is in no acute distress. HEENT:  Normal. SKIN:  Warm and dry without lesions or masses. NECK:  Obese and difficult to assess JVP.  No bruits. LUNGS:  Respirations are  regular and unlabored.  Diminished breath sounds bilaterally.  CARDIAC:  Regular tachycardic S1, S2.  No S3, S4, or murmurs. ABDOMEN:  Obese, firm and distended.  Bowel sounds are present x4. EXTREMITIES:  Warm and dry.  No clubbing or cyanosis with trace to 1+ bilateral lower extremity edema.  Distal pulses are 1+.  IMAGING:  Chest x-ray shows cardiomegaly with increased pulmonary vascularity on November 26, 2010.  Scrotal U/S on November 17, 2010, showing bilateral hydroceles.  EKG shows sinus rhythm, rate of 100, no acute ST-T changes.  LABORATORY DATA:  Hemoglobin 11.0, hematocrit 32.9, WBC 16.6, platelets 247.  Sodium 151, potassium 3.2, chloride 106, CO2 39, BUN 38, creatinine 1.16, glucose 207.  Cardiac markers have been negative. Phosphorus 3.2, calcium 9.6, magnesium 2.4.  ASSESSMENT AND PLAN:  Acute ventilator-dependent respiratory failure in the setting of diastolic failure and right heart failure.  The patient's last echo in May showed presumed normal left ventricular function with left ventricular hypertrophy.  He likely has some component of diastolic failure though at this point, it appears that his right heart failure and probably pulmonary hypertension are driving his volume overload. Early attempts at heart failure management with diuresis resulted in renal insufficiency though BUN and creatinine has improved and the patient has been re-written for diuresis and we agree with this approach.  Options overall  are limited at this point.  Might consider right heart catheterization if the patient stabilizes to better evaluate right heart pressures and a wedge to estimate left heart filling pressures to guide therapy going forward.     Nicolasa Ducking, ANP   ______________________________ Rollene Rotunda, MD, [facility]    CB/MEDQ  D:  11/26/2010  T:  11/27/2010  Job:  [id number]  Electronically Signed by Nicolasa Ducking ANP on 11/27/2010 04:22:25 PM Electronically  Signed by Rollene Rotunda MD [facility] on 12/04/2010 05:47:55 PM

## 2010-12-04 NOTE — Op Note (Signed)
NAMEANSHUL, MEDDINGS NO.:  [id number]  MEDICAL RECORD NO.:  [id number]  LOCATION:  2902                         FACILITY:  [facility]  PHYSICIAN:  Gloris Manchester. Lazarus Salines, M.D. DATE OF BIRTH:  09-29-1965  DATE OF PROCEDURE:  11/30/2010 DATE OF DISCHARGE:                              OPERATIVE REPORT   PREOPERATIVE DIAGNOSIS:  Prolonged intubation.  POSTOPERATIVE DIAGNOSIS:  Prolonged intubation.  PROCEDURE PERFORMED:  Tracheostomy.  SURGEON:  Gloris Manchester. Lazarus Salines, MD  ASSISTANT:  Aquilla Hacker, [facility]  ANESTHESIA:  General orotracheal.  BLOOD LOSS:  Minimal.  COMPLICATIONS:  None.  FINDINGS:  A short neck with a thick double chin and fatty lower tissues.  Small thyroid isthmus.  Deep lying trachea.  PROCEDURE:  With the patient in a comfortable supine position, anesthesia was continued through indwelling orotracheal tube.  The patient was placed in reverse Trendelenburg.  Neck was extended and supported with wide tape.  The lower neck was palpated with the findings as described above.  A 10 mL of 1% Xylocaine With 1:100,000 Epinephrine was infiltrated into the low neck at the proposed surgical field for intraoperative hemostasis.  Several minutes were allowed for this to take effect.  A sterile preparation and draping of the low anterior neck was performed in the standard fashion.  Using a preexisting wrinkle, a 6 cm transverse incision was sharply executed and carried down through skin into the subcutaneous fat.  Some fat was debrided from the field to allow better access.  The superficial layer of the deep cervical fascia was lysed.  Additional fat was removed.  The strap muscles were identified and divided in the midline. There was fatty tissue in this area as well.  Finally the cricoid cartilage was identified and the pretracheal plane was entered.  A moderate sized vein in this vicinity was controlled with 2-0 silk ligature.  The upper portion of the  thyroid isthmus was cross clamped, divided, and controlled with suture ligature of 2-0 silk.  More inferiorly, what appeared to be a small portion of thyroid isthmus was divided with the Bovie cautery.  This allowed access to the anterior face of the trachea.  A trach hook was placed under the cricoid cartilage to elevate the laryngotracheal complex.  In the 1st-2nd interspace, a transverse incision was made into the tracheal lumen.  A 5 mm wide inferiorly based cartilage flap was fashioned.  The mucosal edges were cauterized for hemostasis.  The cartilage flap was secured to the lower wound with a 2- 0 chromic stitch.  A trach spreader was used to widen the opening.  The ET tube was not visible.  At this point, a previously prepared and tested 7 Shiley XLT (proximal) cuffed tracheostomy tube was brought into the field and inserted into the trachea.  The inner cannula was placed and ventilation was assumed per tracheostomy tube.  The cuff was inflated and observed to be intact.  Hemostasis was observed.  The orotracheal tube was removed.  The trach was secured to the neck skin with 2-0 silk sutures and additionally with cotton twill ties.  Once again trach was adequate for ventilation, the cuff was intact, and hemostasis  was observed.  At this point, the procedure was completed. The patient was returned to anesthesia, awakened, and transferred back to the 2900 Intensive Care Unit in stable condition.  COMMENT:  A 45 year old white male with complex medical course including obesity hypoventilation, obstructive sleep apnea, pulmonary artery hypertension, congestive heart failure with prolonged intubation, and extremely difficulty re-intubation at 1 occasion was the indication for today's procedure.  Anticipate a routine postoperative recovery with attention to analgesia and airway and trach hygiene.     Gloris Manchester. Lazarus Salines, M.D.     KTW/MEDQ  D:  11/30/2010  T:  11/30/2010  Job:   [id number]  cc:   Felipa Evener, MD  Electronically Signed by Flo Shanks M.D. on 12/04/2010 05:52:26 PM

## 2010-12-05 ENCOUNTER — Inpatient Hospital Stay (HOSPITAL_COMMUNITY): Payer: Medicare Other

## 2010-12-05 DIAGNOSIS — L89309 Pressure ulcer of unspecified buttock, unspecified stage: Secondary | ICD-10-CM

## 2010-12-05 DIAGNOSIS — N5089 Other specified disorders of the male genital organs: Secondary | ICD-10-CM

## 2010-12-05 DIAGNOSIS — R509 Fever, unspecified: Secondary | ICD-10-CM

## 2010-12-05 LAB — GLUCOSE, CAPILLARY: Glucose-Capillary: 148 mg/dL — ABNORMAL HIGH (ref 70–99)

## 2010-12-05 LAB — URINALYSIS, ROUTINE W REFLEX MICROSCOPIC
Protein, ur: 100 mg/dL — AB
Urobilinogen, UA: 4 mg/dL — ABNORMAL HIGH (ref 0.0–1.0)

## 2010-12-05 LAB — MAGNESIUM: Magnesium: 2.1 mg/dL (ref 1.5–2.5)

## 2010-12-05 LAB — BASIC METABOLIC PANEL
BUN: 33 mg/dL — ABNORMAL HIGH (ref 6–23)
BUN: 28 mg/dL — ABNORMAL HIGH (ref 6–23)
CO2: 40 mEq/L (ref 19–32)
Chloride: 109 mEq/L (ref 96–112)
Chloride: 107 mEq/L (ref 96–112)
Creatinine, Ser: 1.25 mg/dL (ref 0.50–1.35)
GFR calc Af Amer: 83 mL/min — ABNORMAL LOW (ref >90)
GFR calc non Af Amer: 72 mL/min — ABNORMAL LOW (ref >90)
Potassium: 3.3 mEq/L — ABNORMAL LOW (ref 3.5–5.1)
Sodium: 154 mEq/L — ABNORMAL HIGH (ref 135–145)

## 2010-12-05 LAB — CBC
HCT: 31.6 % — ABNORMAL LOW (ref 39.0–52.0)
Hemoglobin: 10.5 g/dL — ABNORMAL LOW (ref 13.0–17.0)
MCV: 76.7 fL — ABNORMAL LOW (ref 78.0–100.0)
RBC: 4.12 MIL/uL — ABNORMAL LOW (ref 4.22–5.81)
WBC: 16.4 10*3/uL — ABNORMAL HIGH (ref 4.0–10.5)

## 2010-12-05 LAB — PHOSPHORUS: Phosphorus: 3.2 mg/dL (ref 2.3–4.6)

## 2010-12-06 ENCOUNTER — Encounter (HOSPITAL_COMMUNITY): Payer: Self-pay | Admitting: Radiology

## 2010-12-06 DIAGNOSIS — J9600 Acute respiratory failure, unspecified whether with hypoxia or hypercapnia: Secondary | ICD-10-CM

## 2010-12-06 DIAGNOSIS — L0390 Cellulitis, unspecified: Secondary | ICD-10-CM

## 2010-12-06 DIAGNOSIS — R509 Fever, unspecified: Secondary | ICD-10-CM

## 2010-12-06 DIAGNOSIS — L0291 Cutaneous abscess, unspecified: Secondary | ICD-10-CM

## 2010-12-06 DIAGNOSIS — R609 Edema, unspecified: Secondary | ICD-10-CM

## 2010-12-06 LAB — URINE CULTURE: Culture: NO GROWTH

## 2010-12-06 LAB — URINALYSIS, MICROSCOPIC ONLY
Glucose, UA: NEGATIVE mg/dL
Ketones, ur: 15 mg/dL — AB
pH: 6.5 (ref 5.0–8.0)

## 2010-12-06 LAB — CBC
Hemoglobin: 9.1 g/dL — ABNORMAL LOW (ref 13.0–17.0)
MCH: 24.9 pg — ABNORMAL LOW (ref 26.0–34.0)
Platelets: 245 10*3/uL (ref 150–400)
RBC: 3.65 MIL/uL — ABNORMAL LOW (ref 4.22–5.81)
WBC: 17.7 10*3/uL — ABNORMAL HIGH (ref 4.0–10.5)

## 2010-12-06 LAB — GLUCOSE, CAPILLARY
Glucose-Capillary: 154 mg/dL — ABNORMAL HIGH (ref 70–99)
Glucose-Capillary: 146 mg/dL — ABNORMAL HIGH (ref 70–99)
Glucose-Capillary: 141 mg/dL — ABNORMAL HIGH (ref 70–99)

## 2010-12-06 LAB — BASIC METABOLIC PANEL
CO2: 39 mEq/L — ABNORMAL HIGH (ref 19–32)
Calcium: 8.9 mg/dL (ref 8.4–10.5)
Glucose, Bld: 173 mg/dL — ABNORMAL HIGH (ref 70–99)
Potassium: 3.4 mEq/L — ABNORMAL LOW (ref 3.5–5.1)
Sodium: 156 mEq/L — ABNORMAL HIGH (ref 135–145)

## 2010-12-06 LAB — MAGNESIUM: Magnesium: 2 mg/dL (ref 1.5–2.5)

## 2010-12-06 LAB — PHOSPHORUS: Phosphorus: 3 mg/dL (ref 2.3–4.6)

## 2010-12-06 MED ORDER — IOHEXOL 300 MG/ML  SOLN
100.0000 mL | Freq: Once | INTRAMUSCULAR | Status: AC | PRN
  Administered 2010-12-06: 100 mL via INTRAVENOUS

## 2010-12-07 LAB — CBC
HCT: 27.4 % — ABNORMAL LOW (ref 39.0–52.0)
MCV: 77.6 fL — ABNORMAL LOW (ref 78.0–100.0)
Platelets: 229 10*3/uL (ref 150–400)
RBC: 3.53 MIL/uL — ABNORMAL LOW (ref 4.22–5.81)
WBC: 15.9 10*3/uL — ABNORMAL HIGH (ref 4.0–10.5)

## 2010-12-07 LAB — BASIC METABOLIC PANEL
BUN: 21 mg/dL (ref 6–23)
CO2: 38 mEq/L — ABNORMAL HIGH (ref 19–32)
Chloride: 111 mEq/L (ref 96–112)
Creatinine, Ser: 1.09 mg/dL (ref 0.50–1.35)
Potassium: 3.4 mEq/L — ABNORMAL LOW (ref 3.5–5.1)

## 2010-12-07 LAB — DIFFERENTIAL
Lymphocytes Relative: 20 % (ref 12–46)
Lymphs Abs: 3.2 10*3/uL (ref 0.7–4.0)
Neutro Abs: 10.9 10*3/uL — ABNORMAL HIGH (ref 1.7–7.7)
Neutrophils Relative %: 69 % (ref 43–77)

## 2010-12-07 LAB — URINE CULTURE: Culture: NO GROWTH

## 2010-12-07 LAB — LACTATE DEHYDROGENASE: LDH: 477 U/L — ABNORMAL HIGH (ref 94–250)

## 2010-12-07 LAB — GLUCOSE, CAPILLARY
Glucose-Capillary: 124 mg/dL — ABNORMAL HIGH (ref 70–99)
Glucose-Capillary: 127 mg/dL — ABNORMAL HIGH (ref 70–99)
Glucose-Capillary: 132 mg/dL — ABNORMAL HIGH (ref 70–99)

## 2010-12-07 LAB — CULTURE, BLOOD (ROUTINE X 2)
Culture  Setup Time: 201210270035
Culture: NO GROWTH

## 2010-12-07 LAB — RHEUMATOID FACTOR: Rhuematoid fact SerPl-aCnc: <10 IU/mL (ref <=14)

## 2010-12-07 LAB — PROCALCITONIN: Procalcitonin: 0.2 ng/mL

## 2010-12-07 MED ORDER — NYSTATIN 100000 UNIT/ML MT SUSP
15.0000 mL | Freq: Four times a day (QID) | OROMUCOSAL | Status: DC
  Administered 2010-12-09 – 2010-12-14 (×21): 1500000 [IU] via ORAL
  Administered 2010-12-14: 500000 [IU] via ORAL
  Administered 2010-12-15 – 2010-12-26 (×44): 1500000 [IU] via ORAL
  Filled 2010-12-07 (×79): qty 15

## 2010-12-07 MED ORDER — PANTOPRAZOLE SODIUM 40 MG IV SOLR
40.0000 mg | INTRAVENOUS | Status: DC
  Administered 2010-12-09 – 2010-12-19 (×11): 40 mg via INTRAVENOUS
  Filled 2010-12-07 (×13): qty 40

## 2010-12-07 MED ORDER — ACETAMINOPHEN 160 MG/5ML PO SOLN
650.0000 mg | Freq: Four times a day (QID) | ORAL | Status: DC | PRN
  Administered 2010-12-25: 650 mg
  Filled 2010-12-07 (×2): qty 20.3

## 2010-12-07 MED ORDER — BISACODYL 10 MG RE SUPP: 10.0000 mg | Freq: Every day | RECTAL | Status: DC | PRN

## 2010-12-07 MED ORDER — METOPROLOL TARTRATE 1 MG/ML IV SOLN
2.5000 mg | Freq: Three times a day (TID) | INTRAVENOUS | Status: DC
  Administered 2010-12-09 – 2010-12-17 (×25): 2.5 mg via INTRAVENOUS
  Filled 2010-12-07 (×25): qty 5

## 2010-12-07 MED ORDER — INSULIN GLARGINE 100 UNIT/ML ~~LOC~~ SOLN
15.0000 [IU] | Freq: Every day | SUBCUTANEOUS | Status: DC
  Administered 2010-12-09 – 2010-12-25 (×17): 15 [IU] via SUBCUTANEOUS
  Filled 2010-12-07: qty 3

## 2010-12-07 MED ORDER — MIDAZOLAM HCL 5 MG/5ML IJ SOLN
2.0000 mg | INTRAMUSCULAR | Status: DC | PRN
  Administered 2010-12-09 (×6): 2 mg via INTRAVENOUS
  Administered 2010-12-09: 4 mg via INTRAVENOUS
  Administered 2010-12-10 (×4): 2 mg via INTRAVENOUS
  Administered 2010-12-10: 4 mg via INTRAVENOUS
  Administered 2010-12-10: 2 mg via INTRAVENOUS
  Administered 2010-12-10 (×2): 4 mg via INTRAVENOUS
  Administered 2010-12-11 (×4): 2 mg via INTRAVENOUS
  Administered 2010-12-11 (×2): 4 mg via INTRAVENOUS
  Administered 2010-12-12 – 2010-12-13 (×6): 2 mg via INTRAVENOUS
  Administered 2010-12-14: 4 mg via INTRAVENOUS
  Administered 2010-12-14: 2 mg via INTRAVENOUS
  Administered 2010-12-14: 4 mg via INTRAVENOUS
  Administered 2010-12-14 (×3): 2 mg via INTRAVENOUS
  Administered 2010-12-14 (×2): 4 mg via INTRAVENOUS
  Administered 2010-12-15: 2 mg via INTRAVENOUS
  Administered 2010-12-15 (×2): 4 mg via INTRAVENOUS
  Administered 2010-12-15: 2 mg via INTRAVENOUS
  Administered 2010-12-15: 4 mg via INTRAVENOUS
  Administered 2010-12-15 (×2): 2 mg via INTRAVENOUS
  Administered 2010-12-15 (×2): 4 mg via INTRAVENOUS
  Administered 2010-12-16 – 2010-12-17 (×14): 2 mg via INTRAVENOUS

## 2010-12-07 MED ORDER — ACETAMINOPHEN 325 MG PO TABS
650.0000 mg | ORAL_TABLET | Freq: Four times a day (QID) | ORAL | Status: DC | PRN
  Administered 2010-12-15 – 2010-12-17 (×4): 650 mg via ORAL
  Filled 2010-12-07 (×6): qty 2

## 2010-12-07 MED ORDER — DEXTROSE 10 % IV SOLN: INTRAVENOUS | Status: DC

## 2010-12-07 MED ORDER — DEXTROSE 5 % IV SOLN
INTRAVENOUS | Status: DC
  Administered 2010-12-09: 04:00:00 via INTRAVENOUS
  Administered 2010-12-09: 1000 mL via INTRAVENOUS
  Administered 2010-12-11: 03:00:00 via INTRAVENOUS

## 2010-12-07 MED ORDER — INSULIN ASPART 100 UNIT/ML ~~LOC~~ SOLN
0.0000 [IU] | SUBCUTANEOUS | Status: DC
  Filled 2010-12-07: qty 3

## 2010-12-07 MED ORDER — COLLAGENASE 250 UNIT/GM EX OINT
TOPICAL_OINTMENT | Freq: Every day | CUTANEOUS | Status: DC
  Administered 2010-12-10 – 2010-12-17 (×7): via TOPICAL
  Administered 2010-12-18: 1 via TOPICAL
  Administered 2010-12-20 – 2010-12-24 (×3): via TOPICAL
  Filled 2010-12-07 (×2): qty 30

## 2010-12-07 MED ORDER — ALBUTEROL SULFATE (5 MG/ML) 0.5% IN NEBU
2.5000 mg | INHALATION_SOLUTION | RESPIRATORY_TRACT | Status: DC | PRN
  Filled 2010-12-07: qty 0.5

## 2010-12-07 MED ORDER — CHLORHEXIDINE GLUCONATE 0.12 % MT SOLN
15.0000 mL | Freq: Two times a day (BID) | OROMUCOSAL | Status: DC
  Administered 2010-12-09 – 2010-12-26 (×33): 15 mL via OROMUCOSAL
  Filled 2010-12-07 (×41): qty 15

## 2010-12-07 MED ORDER — HYPROMELLOSE (GONIOSCOPIC) 2.5 % OP SOLN
1.0000 [drp] | OPHTHALMIC | Status: DC | PRN
  Filled 2010-12-07: qty 15

## 2010-12-07 MED ORDER — ONDANSETRON HCL 4 MG/2ML IJ SOLN: 4.0000 mg | Freq: Four times a day (QID) | INTRAMUSCULAR | Status: DC | PRN

## 2010-12-07 MED ORDER — FREE WATER
300.0000 mL | Freq: Four times a day (QID) | Status: DC
  Filled 2010-12-07 (×17): qty 300

## 2010-12-07 MED ORDER — SODIUM CHLORIDE 0.9 % IV SOLN: INTRAVENOUS | Status: DC

## 2010-12-07 MED ORDER — FENTANYL CITRATE 0.05 MG/ML IJ SOLN
25.0000 ug | INTRAMUSCULAR | Status: DC | PRN
  Administered 2010-12-09 – 2010-12-15 (×39): 50 ug via INTRAVENOUS
  Administered 2010-12-15: 100 ug via INTRAVENOUS
  Administered 2010-12-15: 50 ug via INTRAVENOUS
  Filled 2010-12-07 (×21): qty 2

## 2010-12-07 MED ORDER — DEXTROSE 5 % IV SOLN: INTRAVENOUS | Status: DC

## 2010-12-07 MED ORDER — BIOTENE DRY MOUTH MT LIQD
15.0000 mL | Freq: Four times a day (QID) | OROMUCOSAL | Status: DC
  Administered 2010-12-09 – 2010-12-26 (×67): 15 mL via OROMUCOSAL

## 2010-12-07 MED ORDER — INSULIN ASPART 100 UNIT/ML ~~LOC~~ SOLN
0.0000 [IU] | SUBCUTANEOUS | Status: DC
  Administered 2010-12-09 – 2010-12-22 (×30): 3 [IU] via SUBCUTANEOUS
  Administered 2010-12-22 – 2010-12-23 (×3): 4 [IU] via SUBCUTANEOUS
  Administered 2010-12-23: 3 [IU] via SUBCUTANEOUS
  Administered 2010-12-23: 4 [IU] via SUBCUTANEOUS
  Administered 2010-12-23 – 2010-12-24 (×3): 3 [IU] via SUBCUTANEOUS
  Administered 2010-12-24: 7 [IU] via SUBCUTANEOUS
  Administered 2010-12-25 – 2010-12-26 (×3): 3 [IU] via SUBCUTANEOUS
  Filled 2010-12-07 (×3): qty 3

## 2010-12-07 MED ORDER — PROSOURCE NO CARB PO LIQD
30.0000 mL | ORAL | Status: DC
  Filled 2010-12-07 (×25): qty 30

## 2010-12-07 MED ORDER — SODIUM CHLORIDE 0.9 % IJ SOLN
3.0000 mL | Freq: Two times a day (BID) | INTRAMUSCULAR | Status: DC
  Administered 2010-12-09: 10 mL via INTRAVENOUS
  Administered 2010-12-10: 3 mL via INTRAVENOUS
  Administered 2010-12-10: 10 mL via INTRAVENOUS

## 2010-12-07 MED ORDER — HEPARIN SODIUM (PORCINE) 5000 UNIT/ML IJ SOLN
5000.0000 [IU] | Freq: Three times a day (TID) | INTRAMUSCULAR | Status: DC
  Administered 2010-12-09 – 2010-12-13 (×14): 5000 [IU] via SUBCUTANEOUS
  Filled 2010-12-07 (×20): qty 1

## 2010-12-08 DIAGNOSIS — J9600 Acute respiratory failure, unspecified whether with hypoxia or hypercapnia: Secondary | ICD-10-CM

## 2010-12-08 DIAGNOSIS — E662 Morbid (severe) obesity with alveolar hypoventilation: Secondary | ICD-10-CM

## 2010-12-08 DIAGNOSIS — L0390 Cellulitis, unspecified: Secondary | ICD-10-CM

## 2010-12-08 DIAGNOSIS — L0291 Cutaneous abscess, unspecified: Secondary | ICD-10-CM

## 2010-12-08 DIAGNOSIS — Z9911 Dependence on respirator [ventilator] status: Secondary | ICD-10-CM

## 2010-12-08 LAB — GLUCOSE, CAPILLARY
Glucose-Capillary: 118 mg/dL — ABNORMAL HIGH (ref 70–99)
Glucose-Capillary: 119 mg/dL — ABNORMAL HIGH (ref 70–99)
Glucose-Capillary: 133 mg/dL — ABNORMAL HIGH (ref 70–99)
Glucose-Capillary: 147 mg/dL — ABNORMAL HIGH (ref 70–99)

## 2010-12-08 LAB — BASIC METABOLIC PANEL
Calcium: 8.8 mg/dL (ref 8.4–10.5)
GFR calc Af Amer: 89 mL/min — ABNORMAL LOW (ref >90)
GFR calc non Af Amer: 77 mL/min — ABNORMAL LOW (ref >90)
Glucose, Bld: 146 mg/dL — ABNORMAL HIGH (ref 70–99)
Potassium: 3.3 mEq/L — ABNORMAL LOW (ref 3.5–5.1)
Sodium: 154 mEq/L — ABNORMAL HIGH (ref 135–145)

## 2010-12-09 DIAGNOSIS — J9620 Acute and chronic respiratory failure, unspecified whether with hypoxia or hypercapnia: Secondary | ICD-10-CM

## 2010-12-09 DIAGNOSIS — E870 Hyperosmolality and hypernatremia: Secondary | ICD-10-CM

## 2010-12-09 DIAGNOSIS — R509 Fever, unspecified: Secondary | ICD-10-CM

## 2010-12-09 DIAGNOSIS — E662 Morbid (severe) obesity with alveolar hypoventilation: Secondary | ICD-10-CM

## 2010-12-09 LAB — GLUCOSE, CAPILLARY
Glucose-Capillary: 122 mg/dL — ABNORMAL HIGH (ref 70–99)
Glucose-Capillary: 111 mg/dL — ABNORMAL HIGH (ref 70–99)
Glucose-Capillary: 115 mg/dL — ABNORMAL HIGH (ref 70–99)

## 2010-12-09 LAB — BASIC METABOLIC PANEL
CO2: 34 mEq/L — ABNORMAL HIGH (ref 19–32)
Calcium: 8.9 mg/dL (ref 8.4–10.5)
Chloride: 107 mEq/L (ref 96–112)
Glucose, Bld: 131 mg/dL — ABNORMAL HIGH (ref 70–99)
Potassium: 3.3 mEq/L — ABNORMAL LOW (ref 3.5–5.1)
Sodium: 150 mEq/L — ABNORMAL HIGH (ref 135–145)

## 2010-12-09 LAB — CLOSTRIDIUM DIFFICILE BY PCR: Toxigenic C. Difficile by PCR: NEGATIVE

## 2010-12-09 MED ORDER — LORAZEPAM 2 MG/ML IJ SOLN
2.0000 mg | Freq: Once | INTRAMUSCULAR | Status: AC
  Administered 2010-12-09: 2 mg via INTRAVENOUS
  Filled 2010-12-09: qty 1

## 2010-12-09 MED ORDER — POTASSIUM CHLORIDE 10 MEQ/100ML IV SOLN
10.0000 meq | INTRAVENOUS | Status: AC
  Administered 2010-12-09 (×4): 10 meq via INTRAVENOUS
  Filled 2010-12-09 (×3): qty 100

## 2010-12-09 MED ORDER — POTASSIUM CHLORIDE 10 MEQ/100ML IV SOLN
INTRAVENOUS | Status: AC
  Administered 2010-12-09: 10 meq via INTRAVENOUS
  Filled 2010-12-09: qty 100

## 2010-12-09 MED ORDER — HALOPERIDOL LACTATE 5 MG/ML IJ SOLN
5.0000 mg | Freq: Once | INTRAMUSCULAR | Status: AC
  Administered 2010-12-09: 5 mg via INTRAVENOUS
  Filled 2010-12-09: qty 1

## 2010-12-09 NOTE — Progress Notes (Signed)
  Subjective: The pt continues to have fevers daily of unknown origin.  He is being followed by ID, and the current plan is to follow for now.  He is not weaning well on the vent.  No new issues overnight.  Objective: Vital signs in last 24 hours: Temp:  [99.2 F (37.3 C)-102.8 F (39.3 C)] 101.3 F (38.5 C) (11/04 1226) Pulse Rate:  [95-100] 95  (11/04 0800) Resp:  [19-24] 24  (11/04 0800) BP: (109-132)/(71-99) 132/75 mmHg (11/04 0800) SpO2:  [99 %-100 %] 100 % (11/04 0800) FiO2 (%):  [40 %] 40 % (11/04 1132) Weight:  [172 kg (379 lb 3.1 oz)] 379 lb 3.1 oz (172 kg) (11/04 0410)  Intake/Output from previous day: 11/03 0701 - 11/04 0700 In: -  Out: 400 [Urine:400] Intake/Output this shift: Total I/O In: -  Out: 250 [Urine:250]   Physical Exam: Obese male in nad Chest with diffuse rhonchi, a few basilar crackles.  No wheezing Cor with rrr abd soft, nontender, bs pos LE with mild edema  Lab Results:  [facility] 12/07/10 0941  WBC 15.9*  HGB 8.9*  HCT 27.4*  PLT 229   BMET  Basename 12/09/10 0301 12/08/10 0401 12/07/10 0941  NA 150* 154* 156*  K 3.3* 3.3* 3.4*  CL 107 109 111  CO2 34* 37* 38*  GLUCOSE 131* 146* 120*  BUN 17 19 21   CREATININE 1.18 1.13 1.09  CALCIUM 8.9 8.8 9.3    Studies/Results: No results found.  Assessment/Plan: Patient Active Hospital Problem List: Acute and chronic respiratory failure (12/09/2010)   Assessment: the pt is slowly weaning on psv, but not making a lot of progress.  His fevers may be contributing to this.     Plan:continue with PSV wean as tolerated, but likely will need LTAC Obesity hypoventilation syndrome (12/09/2010)   Assessment: see above   Plan: see above Hypernatremia (12/09/2010)   Assessment: the pt is responding to free water IV, being chased with prn diuretic.  He refuses panda placement.  At some point, nutrition will need to be addressed.    Plan: continue d5w with prn diuretic Fever (12/09/2010)   Assessment:  this has been an ongoing issue.  ID following, and we have decided to follow off abx.  Cdiff is being rechecked.  Will check cxr in am, and reculture if continues to have spikes.   Plan: as above.      Barbaraann Share, M.D. 12/09/2010, 12:35 PM

## 2010-12-09 NOTE — Progress Notes (Addendum)
  Subjective:    Patient ID: [name], male    DOB: 27-Apr-1965, 45 y.o.   MRN: [id number]           Off antibiotics x 72 hours  HPI He was combative overnight and received Haldol. He is alert and trying to talk now.  Review of Systems  Constitutional: Positive for fever.  Gastrointestinal: Positive for diarrhea.  Skin: Negative for rash.      Objective:   Physical Exam  Constitutional: No distress.  Eyes: Conjunctivae are normal.  Cardiovascular: Normal rate, regular rhythm and normal heart sounds.   No murmur heard. Pulmonary/Chest:       On vent. Not weaning. Lots of secretions. Rhonchi.  Abdominal: Soft. Bowel sounds are normal. There is no tenderness.  Skin: No rash noted.       Sacral pressure sore not examined.   UC and BC 11/1 while on antibiotics: negative C diff PCR: negative Chest CT: negative Peripheral blood smear: polychromasia       Assessment & Plan:  The cause of his persistent fevers remains unclear. I would consider repeat UC and BCs tomorrow off antibiotics and closer inspection of his sacral decubitus.  P: 1. Continue observation off antibiotics.  2. Consider repeat cultures tomorrow  3. Check pending SPEP

## 2010-12-10 ENCOUNTER — Inpatient Hospital Stay (HOSPITAL_COMMUNITY): Payer: Medicare Other

## 2010-12-10 DIAGNOSIS — L89159 Pressure ulcer of sacral region, unspecified stage: Secondary | ICD-10-CM

## 2010-12-10 LAB — CBC
MCH: 24.5 pg — ABNORMAL LOW (ref 26.0–34.0)
MCHC: 32.5 g/dL (ref 30.0–36.0)
MCV: 75.2 fL — ABNORMAL LOW (ref 78.0–100.0)
Platelets: 201 10*3/uL (ref 150–400)

## 2010-12-10 LAB — DIFFERENTIAL
Basophils Relative: 0 % (ref 0–1)
Eosinophils Absolute: 0.4 10*3/uL (ref 0.0–0.7)
Eosinophils Relative: 3 % (ref 0–5)
Monocytes Relative: 9 % (ref 3–12)
Neutrophils Relative %: 73 % (ref 43–77)

## 2010-12-10 LAB — GLUCOSE, CAPILLARY
Glucose-Capillary: 125 mg/dL — ABNORMAL HIGH (ref 70–99)
Glucose-Capillary: 108 mg/dL — ABNORMAL HIGH (ref 70–99)

## 2010-12-10 LAB — BASIC METABOLIC PANEL
BUN: 12 mg/dL (ref 6–23)
CO2: 31 mEq/L (ref 19–32)
Calcium: 8.7 mg/dL (ref 8.4–10.5)
GFR calc non Af Amer: 81 mL/min — ABNORMAL LOW (ref >90)
Glucose, Bld: 114 mg/dL — ABNORMAL HIGH (ref 70–99)

## 2010-12-10 LAB — GLUCOSE, RANDOM: Glucose, Bld: 108 mg/dL — ABNORMAL HIGH (ref 70–99)

## 2010-12-10 MED ORDER — PRO-STAT SUGAR FREE PO LIQD
30.0000 mL | Freq: Every day | ORAL | Status: DC
  Administered 2010-12-15 – 2010-12-19 (×24): 30 mL via ORAL
  Filled 2010-12-10 (×62): qty 30

## 2010-12-10 MED ORDER — POTASSIUM CHLORIDE 10 MEQ/100ML IV SOLN
10.0000 meq | INTRAVENOUS | Status: AC
  Administered 2010-12-10 (×3): 10 meq via INTRAVENOUS
  Filled 2010-12-10 (×3): qty 100

## 2010-12-10 NOTE — Progress Notes (Signed)
UR Completed.  [name] [name] 12/10/2010  

## 2010-12-10 NOTE — Progress Notes (Signed)
   Subjective: PS high attempts, slight lower fever curve  Objective: Vital signs in last 24 hours: Temp:  [99.2 F (37.3 C)-102.6 F (39.2 C)] 100.5 F (38.1 C) (11/05 1100) Pulse Rate:  [81-113] 102  (11/05 1226) Resp:  [19-29] 22  (11/05 0813) BP: (98-129)/(52-81) 116/64 mmHg (11/05 1226) SpO2:  [91 %-100 %] 100 % (11/05 0813) FiO2 (%):  [40 %] 40 % (11/05 1226) Weight:  [174 kg (383 lb 9.6 oz)] 383 lb 9.6 oz (174 kg) (11/05 0042)  Intake/Output from previous day: 11/04 0701 - 11/05 0700 In: 2457.5 [I.V.:2057.5; IV Piggyback:400] Out: 1100 [Urine:1100] Intake/Output this shift: Total I/O In: 225 [I.V.:225] Out: -    Physical Exam: Obese male in nad Chest with diffuse rhonchi, a few basilar crackles.  No wheezing Cor with rrr abd soft, nontender, bs pos LE with mild edema  Lab Results: No results found for this basename: WBC:3,HGB:3,HCT:3,PLT:3 in the last 72 hours BMET  [facility] 12/10/10 0923 12/09/10 0301 12/08/10 0401  NA -- 150* 154*  K -- 3.3* 3.3*  CL -- 107 109  CO2 -- 34* 37*  GLUCOSE 108* 131* 146*  BUN -- 17 19  CREATININE -- 1.18 1.13  CALCIUM -- 8.9 8.8    Studies/Results: Dg Chest Portable 1 View  12/10/2010  *RADIOLOGY REPORT*  Clinical Data: Fever  PORTABLE CHEST - 1 VIEW  Comparison: Chest radiograph 12/05/2010 CT 12/06/2010  Findings: Tracheostomy tube is in good position.  Stable enlarged heart silhouette. Central venous congestion similar to prior.  Mild linear atelectasis at the left lung base.  No pneumothorax.  IMPRESSION:  1.  No significant change. 2.  Central venous congestion and mild atelectasis.  Original Report Authenticated By: Genevive Bi, M.D.    Assessment/Plan: Patient Active Hospital Problem List: Acute and chronic respiratory failure (12/09/2010)   Assessment: the pt is slowly weaning on psv, but not making a lot of progress.   Plan:continue with PSV wean as tolerated, but likely will need LTAC. Tolerating pos balance    ltach refferral when fevers resolved? Attempt PS 15-18 upright Obesity hypoventilation syndrome (12/09/2010)   Assessment: see above   Plan: see above Hypernatremia (12/09/2010)   Assessment: free water def   Plan: continue d5w at 75, assess bmet now and in am . May need increase Avoid rapid na changes when able, therefore bmet today Fever (12/09/2010)   Assessment: fever curve lisght better spep sent   Plan: as above.      Rory Percy, MD [phone] 12/10/2010, 2:33 PM

## 2010-12-10 NOTE — Progress Notes (Signed)
Nutrition Follow up:  Diet:  NPO.  Slow weaning.  Pulled panda and refuses to have it placed.  Unstageable wound to sacrum.  Urine dark tea colored.  I/O +.  Labs noted.  Na 150 Hypernatremia ongoing.  K+ 3.3 low, BUN 17, Creat:  1.18, Glucose 226.  Wt.  383# 9oz  (439# on admit) Usual Wt=385# At risk for malnutrition  Nutrition dx:  Inadequate oral intake continues.  Still on vent support therefore unable to begin diet.  Goal:  Based on plan of care.  Plan:  If unable to wean, rec resume TF-  Currently pt refusing panda.  unstageable wound  Osmolite 1.5 at 29ml/hr plus Prostat 30 ml 6x/day to provide:  1512kcal, 135g protein, free water/day   Monitor plan of care.

## 2010-12-10 NOTE — Progress Notes (Signed)
  Subjective: The pt continues to have fevers daily of unknown origin.  He is being followed by ID, and the current plan is to follow for now.  He is not weaning well on the vent.  No new issues overnight. Requires full support.  Objective: Vital signs in last 24 hours: Temp:  [99.2 F (37.3 C)-102.6 F (39.2 C)] 100.5 F (38.1 C) (11/05 1100) Pulse Rate:  [81-113] 102  (11/05 1226) Resp:  [19-29] 22  (11/05 0813) BP: (98-129)/(52-81) 116/64 mmHg (11/05 1226) SpO2:  [91 %-100 %] 100 % (11/05 0813) FiO2 (%):  [40 %] 40 % (11/05 1226) Weight:  [383 lb 9.6 oz (174 kg)] 383 lb 9.6 oz (174 kg) (11/05 0042)  Intake/Output from previous day: 11/04 0701 - 11/05 0700 In: 2457.5 [I.V.:2057.5; IV Piggyback:400] Out: 1100 [Urine:1100] Intake/Output this shift: Total I/O In: 225 [I.V.:225] Out: -    Physical Exam: Obese male in nad Chest with diffuse rhonchi, a few basilar crackles.  No wheezing Cor with rrr abd soft, nontender, bs pos LE with mild edema  Lab Results: No results found for this basename: WBC:3,HGB:3,HCT:3,PLT:3 in the last 72 hours BMET  [facility] 12/10/10 0923 12/09/10 0301 12/08/10 0401  NA -- 150* 154*  K -- 3.3* 3.3*  CL -- 107 109  CO2 -- 34* 37*  GLUCOSE 108* 131* 146*  BUN -- 17 19  CREATININE -- 1.18 1.13  CALCIUM -- 8.9 8.8    Studies/Results: Dg Chest Portable 1 View  12/10/2010  *RADIOLOGY REPORT*  Clinical Data: Fever  PORTABLE CHEST - 1 VIEW  Comparison: Chest radiograph 12/05/2010 CT 12/06/2010  Findings: Tracheostomy tube is in good position.  Stable enlarged heart silhouette. Central venous congestion similar to prior.  Mild linear atelectasis at the left lung base.  No pneumothorax.  IMPRESSION:  1.  No significant change. 2.  Central venous congestion and mild atelectasis.  Original Report Authenticated By: Genevive Bi, M.D.    Assessment/Plan: Patient Active Hospital Problem List: Acute and chronic respiratory failure    Assessment: the  pt is not weaning well on psv, and not making a lot of progress.  His fevers may be contributing to this.  (per ID)   Plan:continue with PSV wean as tolerated, but likely will need LTAC  Obesity hypoventilation syndrome (12/09/2010)   Assessment: see above   Plan: see above  Hypernatremia (12/09/2010)   Assessment: the pt is responding to free water IV, being chased with prn diuretic.  He refuses panda placement.  At some point, nutrition will need to be addressed.    Plan: continue d5w with prn diuretic  Fever (12/09/2010)   Assessment: this has been an ongoing issue.  ID following, and we have decided to follow off abx.  Cdiff is being rechecked.  Will check cxr in am, and reculture if continues to have spikes.   Plan: as above.    Sacral Wound: Plan Followed by [facility] no surgical interventions required.   MINOR,WILLIAM S 12/10/2010, 1:21 PM

## 2010-12-10 NOTE — Progress Notes (Signed)
Physical Therapy Treatment Patient Details Name: [name] MRN: [id number] DOB: [birth year] Today's Date: 12/10/2010  PT Assessment/Plan  PT - Assessment/Plan Comments on Treatment Session: Pt is 45 yo male with scrotal swelling and probable sepsis, continued vent support with failure to wean.  Pt also with no current means on nutrition, as pulled PANDA.  Pt with limited bed mobility and all OOB mobility. PT Plan: Discharge plan remains appropriate PT Frequency: Min 3X/week Follow Up Recommendations: Skilled nursing facility PT Goals  Acute Rehab PT Goals PT Goal Formulation: With patient Time For Goal Achievement: 2 weeks Pt will Roll Supine to Right Side: with mod assist PT Goal: Rolling Supine to Right Side - Progress: Met Pt will Roll Supine to Left Side: with mod assist PT Goal: Rolling Supine to Left Side - Progress: Met Pt will Sit at Edge of Bed: with min assist;with bilateral upper extremity support;1-2 min;Other (comment) (would like patient to reach min-guard) PT Goal: Sit at [employer] Of Bed - Progress: Not met Pt will Transfer Sit to Stand/Stand to Sit: with +2 total assist (pt to perform 30%) PT Transfer Goal: Sit to Stand/Stand to Sit - Progress: Not met Pt will Transfer Bed to Chair/Chair to Bed: with +2 total assist PT Transfer Goal: Bed to Chair/Chair to Bed - Progress: Not met (pt to perform 30%)  PT Treatment Precautions/Restrictions  Precautions Precautions: Fall Precaution Comments: morbid obesity Mobility (including Balance) Bed Mobility Bed Mobility: Yes (Slide to [facility] with +2 Tot, pt 60%) Rolling Right: 4: Min assist Rolling Right Details (indicate cue type and reason): verbal cues to bridge knees and to reach across chest with left arm, left arm guided by therapist Rolling Left: 4: Min assist;Other (comment) Rolling Left Details (indicate cue type and reason): better rolling to left than right, able to better bridge right knee, maintained left sidelying 5  min with supervision for washing of back with OT Transfers Transfers: No Ambulation/Gait Ambulation/Gait: No Stairs: No Wheelchair Mobility Wheelchair Mobility: No    Exercise  General Exercises - Lower Extremity Ankle Circles/Pumps: AROM;Strengthening;Both;15 reps (able to invert/ evert better than df/ pf) Heel Slides: 5 reps;Both;AROM;Strengthening Straight Leg Raises: Other (comment) (attempted but only able to perform HS) End of Session PT - End of Session Activity Tolerance: Treatment limited secondary to medical complications (Comment) (pt with continued fevers) Patient left: in bed General Behavior During Session: Lethargic Cognition: WFL for tasks performed Co-session with OT. Vertie Dibbern, [place] 12/10/2010, 3:24 PM

## 2010-12-10 NOTE — Progress Notes (Signed)
Occupational Therapy Treatment Patient Details Name: RUFFIN LADA MRN: [id number] DOB: Sep 17, 1965 Today's Date: 12/10/2010  OT Assessment/Plan OT Assessment/Plan Comments on Treatment Session: Pt. with increased time due to increased secretions requiring management with suction from RN and this therapist OT Plan: Discharge plan remains appropriate OT Frequency: Min 1X/week Follow Up Recommendations: LTACH Equipment Recommended: Defer to next venue OT Goals Acute Rehab OT Goals OT Goal Formulation: With patient Time For Goal Achievement: 2 weeks  OT Treatment Precautions/Restrictions  Precautions Precautions: Fall Precaution Comments: morbid obesity   ADL ADL Eating/Feeding: NPO Grooming: Performed;Wash/dry face;Minimal assistance Grooming Details (indicate cue type and reason): Assist for thoroughness due to patient fatiguing quickly with activity Where Assessed - Grooming: Supine, head of bed up Upper Body Bathing: Performed;+1 Total assistance (Pt. provided with washing of the back during rolling ) Lower Body Bathing: Not assessed Upper Body Dressing: Not assessed Lower Body Dressing: Not assessed Toilet Transfer: Not assessed Toileting - Clothing Manipulation: Not assessed Toileting - Hygiene: Not assessed ADL Comments: Pt. with decreased activity tolerance today and fatiguing quickle due to lethargy. Mobility  Bed Mobility Bed Mobility: Yes (Slide to [facility] with +2 Tot, pt 60%) Rolling Right: 4: Min assist Rolling Right Details (indicate cue type and reason): verbal cues to bridge knees and to reach across chest with left arm, left arm guided by therapist Rolling Left: 4: Min assist;Other (comment) Rolling Left Details (indicate cue type and reason): better rolling to left than right, able to better bridge right knee, maintained left sidelying 5 min with supervision for washing of back with OT Transfers Transfers: No Exercises General Exercises - Upper  Extremity Shoulder Flexion: AROM;AAROM;5 reps;Supine Elbow Flexion: AROM;5 reps;Supine General Exercises - Lower Extremity Ankle Circles/Pumps: AROM;Strengthening;Both;15 reps (able to invert/ evert better than df/ pf) Heel Slides: 5 reps;Both;AROM;Strengthening Straight Leg Raises: Other (comment) (attempted but only able to perform HS)  End of Session OT - End of Session Activity Tolerance: Patient limited by fatigue;Patient limited by pain Patient left: in bed;with call bell in reach General Behavior During Session: Lethargic Cognition: WFL for tasks performed  Co-tx with PT and above note encompasses both PT and OT information.  Jaaliyah Lucatero, OTR/L  12/10/2010, 3:34 PM

## 2010-12-10 NOTE — Progress Notes (Signed)
INFECTIOUS DISEASE PROGRESS NOTE  ID: [name] is a 45 y.o. male who is morbidly obese, who has OHS/OSA presented with scrotal edema, and acute on chronic respiratory failure s/p trach. Has had ongoing fevers, blood cultures negative. FUO work-up non revealing  Subjective: Febrile to 100.3 overnight, less than previous 24hrs. Wound care is now changing his regimen to his sacral decub in order to debride in order to stage his ulcer. No hypotension. Patient denies chills/fever. He would like ice chips.  Abtx:  none Medications: I have reviewed the patient's current medications.  Objective: Vital signs in last 24 hours: Temp:  [99.2 F (37.3 C)-102.6 F (39.2 C)] 99.4 F (37.4 C) (11/05 1700) Pulse Rate:  [81-113] 102  (11/05 1630) Resp:  [19-26] 23  (11/05 1200) BP: (98-129)/(52-81) 116/64 mmHg (11/05 1226) SpO2:  [91 %-100 %] 100 % (11/05 1200) FiO2 (%):  [40 %] 40 % (11/05 1630) Weight:  [174 kg (383 lb 9.6 oz)] 383 lb 9.6 oz (174 kg) (11/05 0042)  BP 116/64  Pulse 102  Temp(Src) 99.4 F (37.4 C) (Oral)  Resp 23  Ht 5\' 6"  (1.676 m)  Wt 174 kg (383 lb 9.6 oz)  BMI 61.91 kg/m2  SpO2 100%  General Appearance:    Alert, cooperative, no distress, appears stated age  Head:    Normocephalic, without obvious abnormality, atraumatic  Eyes:    PERRL, conjunctiva/corneas clear, EOM's intact, fundi    benign, both eyes          Nose:   Nares normal, septum midline, mucosa normal, no drainage   or sinus tenderness  Throat:   Lips, mucosa, and tongue normal; teeth and gums normal  Neck:   Supple, symmetrical, trach piec is midline, no adenopathy;        no carotid bruit or JVD     Lungs:     Clear to auscultation bilaterally, respirations unlabored  Chest wall:    No tenderness or deformity  Heart:    Regular rate and rhythm, S1 and S2 normal, no murmur, rub   or gallop  Abdomen:     Protuberant abdomen, bowel sounds active all four quadrants,    no masses, no organomegaly    Genitalia:    Normal male without lesion, discharge or tenderness, has [place] condom catheter in plac  Rectal:    deferred  Extremities:   Extremities normal, atraumatic, no cyanosis or edema  Pulses:   2+ and symmetric all extremities  Skin:   Skin color, dry, turgor normal, no rashes or lesions  Lymph nodes:   Cervical, supraclavicular, and axillary nodes normal  Neurologic:   CNII-XII intact. Normal strength, sensation and reflexes      throughout   Lab Results CBC    Component Value Date/Time   WBC 15.9* 12/07/2010 0941   RBC 3.53* 12/07/2010 0941   HGB 8.9* 12/07/2010 0941   HCT 27.4* 12/07/2010 0941   PLT 229 12/07/2010 0941   MCV 77.6* 12/07/2010 0941   MCH 25.2* 12/07/2010 0941   MCHC 32.5 12/07/2010 0941   RDW 16.8* 12/07/2010 0941   LYMPHSABS 3.2 12/07/2010 0941   MONOABS 1.1* 12/07/2010 0941   EOSABS 0.6 12/07/2010 0941   BASOSABS 0.0 12/07/2010 0941    CMP     Component Value Date/Time   NA 150* 12/09/2010 0301   K 3.3* 12/09/2010 0301   CL 107 12/09/2010 0301   CO2 34* 12/09/2010 0301   GLUCOSE 108* 12/10/2010 0923   BUN  17 12/09/2010 0301   CREATININE 1.18 12/09/2010 0301   CALCIUM 8.9 12/09/2010 0301   PROT 7.0 12/01/2010 0400   ALBUMIN 2.6* 12/01/2010 0400   AST 187* 12/01/2010 0400   ALT 54* 12/01/2010 0400   ALKPHOS 47 12/01/2010 0400   BILITOT 0.8 12/01/2010 0400   GFRNONAA 73* 12/09/2010 0301   GFRAA 85* 12/09/2010 0301   hiv negative RF negative SPEP pending ESR 66 Microbiology: cdiff negative Blood cultures negative Studies/Results: cxr 11/05 per my read: patient still has pulmonary edema  Assessment/Plan: FUO vs. Untreated source of infection. 1) please get cbc with diff in the morning 2) would like to exam his sacral decub tomorrow with nursing team to see if possible sources of his fever. His last CT did not comment on depth of his wound. If fever persists, would recommend dedicated pelvis CT to see if wound reaches pelvis, concerning for  osteomyelitis  Ardian Haberland Infectious Diseases 12/10/2010, 5:43 PM

## 2010-12-10 NOTE — Progress Notes (Signed)
CSW reviewed chart and spoke with RN.  CSW continues to attempt to locate appropriate dc location.  Pt is not an LTAC candidate due to insurance (LTACs do not accept Medicaid).  Pt is currently on the wait list for [facility].  CSW has left a message with [facility] regarding wait list.  CSW to continue to follow and assist as needed.

## 2010-12-10 NOTE — Consults (Signed)
WOC follow up: wound care follow up.  Pt has large unstageable PU to sacrum  33cm x 13cm x 0.2 cm with weeping at wound edges.  60% soft black eschar.  Wound edges are blister like with dermal layer easily peeled away during hydrotherapy today. Seen at bedside with PT, eschar has been crosshatched but hydrotherapy does not seem to be making much progress with eschar.  Will stop hydrotherapy at current time to allow for Santy to soften necrotic tissue more, may need to restart hydro therapy after eschar softened. Concerned that the progression of this ulcer so quickly and from my first assessment the quick progression that this etiology may be more than pressure. Concerned for Kyung Rudd terminal ulcer that is sometimes indicative worsening status of patient.  Will monitor after one week of treatment with debridement agent alone. Pt to be continued to be on turning schedule.  Natally Ribera [employer], CWOCN [phone])

## 2010-12-11 DIAGNOSIS — L0390 Cellulitis, unspecified: Secondary | ICD-10-CM

## 2010-12-11 DIAGNOSIS — E662 Morbid (severe) obesity with alveolar hypoventilation: Secondary | ICD-10-CM

## 2010-12-11 DIAGNOSIS — L0291 Cutaneous abscess, unspecified: Secondary | ICD-10-CM

## 2010-12-11 DIAGNOSIS — Z9911 Dependence on respirator [ventilator] status: Secondary | ICD-10-CM

## 2010-12-11 DIAGNOSIS — J9600 Acute respiratory failure, unspecified whether with hypoxia or hypercapnia: Secondary | ICD-10-CM

## 2010-12-11 LAB — ANTI-NUCLEAR AB-TITER (ANA TITER)

## 2010-12-11 LAB — BASIC METABOLIC PANEL
BUN: 12 mg/dL (ref 6–23)
Calcium: 8.9 mg/dL (ref 8.4–10.5)
GFR calc Af Amer: >90 mL/min (ref >90)
GFR calc non Af Amer: 81 mL/min — ABNORMAL LOW (ref >90)
Glucose, Bld: 115 mg/dL — ABNORMAL HIGH (ref 70–99)
Potassium: 3.5 mEq/L (ref 3.5–5.1)
Sodium: 144 mEq/L (ref 135–145)

## 2010-12-11 LAB — CULTURE, BLOOD (ROUTINE X 2)
Culture  Setup Time: 201210310839
Culture: NO GROWTH

## 2010-12-11 LAB — GLUCOSE, CAPILLARY
Glucose-Capillary: 113 mg/dL — ABNORMAL HIGH (ref 70–99)
Glucose-Capillary: 122 mg/dL — ABNORMAL HIGH (ref 70–99)
Glucose-Capillary: 116 mg/dL — ABNORMAL HIGH (ref 70–99)

## 2010-12-11 LAB — PROTEIN ELECTROPHORESIS, SERUM
Alpha-2-Globulin: 17.2 % — ABNORMAL HIGH (ref 7.1–11.8)
Beta 2: 12.9 % — ABNORMAL HIGH (ref 3.2–6.5)
Beta Globulin: 5.8 % (ref 4.7–7.2)
M-Spike, %: 0.4 g/dL
Total Protein ELP: 6.7 g/dL (ref 6.0–8.3)

## 2010-12-11 MED ORDER — DEXTROSE-NACL 5-0.45 % IV SOLN
INTRAVENOUS | Status: DC
  Administered 2010-12-11: 14:00:00 via INTRAVENOUS

## 2010-12-11 MED ORDER — SODIUM CHLORIDE 0.9 % IJ SOLN
10.0000 mL | Freq: Two times a day (BID) | INTRAMUSCULAR | Status: DC
  Administered 2010-12-11 – 2010-12-26 (×27): 10 mL via INTRAVENOUS

## 2010-12-11 NOTE — Progress Notes (Signed)
INFECTIOUS DISEASE PROGRESS NOTE  ID: [name] is a 45 y.o. male who is morbidly obese, who has OHS/OSA presented with scrotal edema, and acute on chronic respiratory failure s/p trach. Has had ongoing fevers, blood cultures negative. FUO work-up non revealing  Subjective: Febrile to 101.86F and currently febrile, slightly higher than the previous 24hrs.  No hypotension. Patient denies chills/fever. Patient scheduled for FEES. He is anxious to eat.  Abtx:  none Medications:     . antiseptic oral rinse  15 mL Mouth Rinse QID  . chlorhexidine  15 mL Mouth/Throat BID  . collagenase   Topical Daily  . feeding supplement  30 mL Oral 6 X Daily  . heparin subcutaneous  5,000 Units Subcutaneous Q8H  . insulin aspart  0-20 Units Subcutaneous Q4H  . insulin glargine  15 Units Subcutaneous QHS  . metoprolol  2.5 mg Intravenous Q8H  . nystatin  15 mL Oral QID  . pantoprazole (PROTONIX) IV  40 mg Intravenous Q24H  . potassium chloride  10 mEq Intravenous Q1 Hr x 4  . sodium chloride  10 mL Intravenous Q12H  . DISCONTD: sodium chloride  3 mL Intravenous Q12H    Objective: Vital signs in last 24 hours: Temp:  [99.4 F (37.4 C)-101.4 F (38.6 C)] 100.7 F (38.2 C) (11/06 1200) Pulse Rate:  [68-113] 100  (11/06 0800) Resp:  [19-34] 21  (11/06 0800) BP: (101-137)/(63-82) 134/78 mmHg (11/06 0800) SpO2:  [97 %-100 %] 100 % (11/06 0800) FiO2 (%):  [40 %] 40 % (11/06 0900) Weight:  [168.7 kg (371 lb 14.7 oz)] 371 lb 14.7 oz (168.7 kg) (11/06 0022)  General Appearance:    Alert, cooperative, no distress, appears stated age  Head:    Normocephalic, without obvious abnormality, atraumatic  Eyes:    PERRL, conjunctiva/corneas clear, EOM's intact, fundi    benign, both eyes          Nose:   Nares normal, septum midline, mucosa normal, no drainage   or sinus tenderness  Throat:   Trach in place; teeth and gums normal  Neck:   Supple, symmetrical, trach piec is midline, no adenopathy;      no carotid bruit or JVD     Lungs:     Clear to auscultation bilaterally, respirations unlabored  Chest wall:    No tenderness or deformity  Heart:    Distant heart sounds, RRR, S1 and S2 normal, no g/m/r  Abdomen:     Protuberant abdomen, bowel sounds active all four quadrants,    no masses, no organomegaly  Genitalia:   has [place] condom catheter in plac     Extremities:   Extremities normal, atraumatic, no cyanosis or edema  Pulses:   2+ and symmetric all extremities  Skin:   Skin color, dry, turgor normal, no rashes or lesions         Lab Results CBC    Component Value Date/Time   WBC 13.8* 12/10/2010 2218   RBC 3.35* 12/10/2010 2218   HGB 8.2* 12/10/2010 2218   HCT 25.2* 12/10/2010 2218   PLT 201 12/10/2010 2218   MCV 75.2* 12/10/2010 2218   MCH 24.5* 12/10/2010 2218   MCHC 32.5 12/10/2010 2218   RDW 16.8* 12/10/2010 2218   LYMPHSABS 2.1 12/10/2010 2218   MONOABS 1.2* 12/10/2010 2218   EOSABS 0.4 12/10/2010 2218   BASOSABS 0.0 12/10/2010 2218    CMP     Component Value Date/Time   NA 144 12/11/2010 0410  K 3.5 12/11/2010 0410   CL 102 12/11/2010 0410   CO2 32 12/11/2010 0410   GLUCOSE 115* 12/11/2010 0410   BUN 12 12/11/2010 0410   CREATININE 1.08 12/11/2010 0410   CALCIUM 8.9 12/11/2010 0410   PROT 7.0 12/01/2010 0400   ALBUMIN 2.6* 12/01/2010 0400   AST 187* 12/01/2010 0400   ALT 54* 12/01/2010 0400   ALKPHOS 47 12/01/2010 0400   BILITOT 0.8 12/01/2010 0400   GFRNONAA 81* 12/11/2010 0410   GFRAA >90 12/11/2010 0410   hiv negative RF negative ANA is weakly positive 1:40 SPEP is abn with alpha 1 globulin 7.4; alpha 2 globulin 17.2 ESR 66 Microbiology: cdiff negative Blood cultures negative Studies/Results: cxr 11/05 per my read: patient still has pulmonary edema  Assessment/Plan: FUO 2/2 SPEP abnormality vs. untreated source of infection . 1) please ask hematologist consult/interpretation of SPEP results, and whether this could explain the patient's on going fevers.  If not, I would recommend repeat dedicated pelvic CT to look at sacral decub and if it communicates with bone, to cause osteomyelitis. 2) would like to exam his sacral decub tomorrow with nursing team to see if possible sources of his fever.   Milbert Bixler Infectious Diseases 12/11/2010, 4:12 PM

## 2010-12-11 NOTE — Procedures (Signed)
FEES Procedure Note Patient Details  Name: ARRAN FESSEL MRN: [id number] Date of Birth: [birth year]  Today's Date: 12/11/2010 Past Medical History:  Past Medical History  Diagnosis Date  . Hypertension   . CHF (congestive heart failure)   . Ventilator dependent    Clinical Impression: Demonstrates a severe pharyngeal phase dysphagia marked by decreased airway protection and inability to use subglottic air to clear any laryngeal penetrate or tracheal aspirate due to the impacts of a fully inflated cuffed trach with high ventilatory pressure support.  If/when the patient can tolerate trach collar trials with use of PMSV, he will likely be able to resume a PO diet safely  at that time as he will have the ability to clear his upper airway  with PMSV use.  *Thin liquid not directly tested, however evidence of silent aspiration once scope passed following patient impulsively grabbing green-dye bottle from cart and swallowing several sips prior to FEES. Evidence of gross pharyngeal/glottic green secretions and aspiration. Cued repeat swallows did reduce residue but did not fully clear from pharynx/glottis.  Recommendations:  Initial recommendation included a PO diet of Dys.1 and Honey-thickened liquids, however due to patient's impulsivity and high risk for aspiration, without adequate ability to clear, as well as discussion with Dr. Sung Amabile via telephone, the current recommendation is : Continue NPO.  Will repeat FEES if/when patient can tolerate any cuff deflation on vent to access upper airway for clearance purposes or when able to tolerate trach collar trials with PMSV.  Will resume with PMSV trials in-line with RT on 11/7.   Myra Rude L 12/11/2010, 4:10 PM

## 2010-12-11 NOTE — Progress Notes (Addendum)
  Subjective: The pt continues to have fevers daily of unknown origin.  He is being followed by ID, and the current plan is to follow for now.  He is not weaning well on the vent.  No new issues overnight. Requires full support. Plan fpr swallow eval 11/6  Objective: Vital signs in last 24 hours: Temp:  [99.4 F (37.4 C)-101.4 F (38.6 C)] 99.8 F (37.7 C) (11/06 0412) Pulse Rate:  [57-113] 91  (11/06 0700) Resp:  [19-34] 23  (11/06 0700) BP: (101-137)/(63-101) 123/75 mmHg (11/06 0600) SpO2:  [97 %-100 %] 99 % (11/06 0700) FiO2 (%):  [40 %] 40 % (11/06 0412) Weight:  [371 lb 14.7 oz (168.7 kg)] 371 lb 14.7 oz (168.7 kg) (11/06 0022)  Intake/Output from previous day: 11/05 0701 - 11/06 0700 In: 1800 [I.V.:1800] Out: 1100 [Urine:1100] Intake/Output this shift:     Physical Exam: Obese male in nad Chest with diffuse rhonchi, a few basilar crackles.  No wheezing Cor with rrr abd soft, nontender, bs pos Condom cath LE with mild edema  Lab Results:  [facility] 12/10/10 2218  WBC 13.8*  HGB 8.2*  HCT 25.2*  PLT 201   BMET  Basename 12/11/10 0410 12/10/10 2218 12/10/10 0923 12/09/10 0301  NA 144 144 -- 150*  K 3.5 3.5 -- 3.3*  CL 102 102 -- 107  CO2 32 31 -- 34*  GLUCOSE 115* 114* 108* --  BUN 12 12 -- 17  CREATININE 1.08 1.08 -- 1.18  CALCIUM 8.9 8.7 -- 8.9    Studies/Results: Dg Chest Portable 1 View  12/10/2010  *RADIOLOGY REPORT*  Clinical Data: Fever  PORTABLE CHEST - 1 VIEW  Comparison: Chest radiograph 12/05/2010 CT 12/06/2010  Findings: Tracheostomy tube is in good position.  Stable enlarged heart silhouette. Central venous congestion similar to prior.  Mild linear atelectasis at the left lung base.  No pneumothorax.  IMPRESSION:  1.  No significant change. 2.  Central venous congestion and mild atelectasis.  Original Report Authenticated By: Genevive Bi, M.D.    Assessment/Plan: Patient Active Hospital Problem List: Acute and chronic respiratory failure      Assessment: the pt is not weaning well on psv, and not making a lot of progress.  His fevers may be contributing to this.  (per ID)   Plan:continue with PSV wean as tolerated, but likely will need LTAC  Obesity hypoventilation syndrome (12/09/2010)   Assessment: see above   Plan: see above  Hypernatremia (12/09/2010)   Assessment: the pt is responding to free water IV, being chased with prn diuretic.  He refuses panda placement.  At some point, nutrition will need to be addressed.    Plan: continue d5w with prn diuretic  Fever (12/09/2010)   Assessment: this has been an ongoing issue.  ID following, and we have decided to follow off abx.  Cdiff is being rechecked.  Will check cxr in am, and reculture if continues to have spikes.   Plan: as above.    Sacral Wound: Plan Followed by [facility] no surgical interventions required. Malnourished: Plan: Swallow eval for 11/6   MINOR,WILLIAM S 12/11/2010, 8:15 AM  Please note that we are having an objective swallow testing done today with cuff up on vent to ensure safety for swallowing Continue PS 18 attempts

## 2010-12-11 NOTE — Progress Notes (Signed)
Speech Language/Pathology  Discussed case with Dr. Tyson Alias via telephone.  Given pressure support needs, pulmonary compromise and difficulty weaning from vent, an objective swallow assessment is warranted to ensure safest PO diet.  FEES planned for 1300 today.  Passy-Muir valve in-line with RT at 1000 today.  Results for both to follow.

## 2010-12-11 NOTE — Progress Notes (Signed)
CSW met with pt at bedside and provided emotional support.  Pt appeared aggitated; shaking fists, shaking head "NO", and avoiding eye contact.  CSW utilized Motivation Interviewing techniques with pt such as accepting resistance and empowering pt.  Pt enjoys [facility] as he has several acquaintances there, enjoys the staff, and enjoys his routine.   Pt's goal is to return to [facility] at dc.  CSW discussed ways to achieve this goal such as following doctor's orders.  CSW also stated that if pt is not able to utilize CPAP, there are currently no facilities in [place] pt can dc to.  CSW and pt spent time processing this information and emotions associated with this.   Pt stated he is frustrated and his focus is on eating.  CSW reviewed ways to move pt's focus from not being able to eat to other activities such as crossword puzzles, magazines, and prayer.  Pt declined having a chaplain come to visit him in his room and confided that his preacher passed away two weeks ago.  Pt was teary, but would not elaborate further.  Pt stated he will utilize crossword puzzles and magazines.  CSW provided additional emotional support and reviewed current support system; hospital staff, SNF staff, and religion.  Pt shared he enjoys praying and CSW validated the positive feelings related to prayer.  Pt requested to have some time to himself and consented to CSW returning tomorrow.  CSW will update nursing staff to this information.  At the conclusion of today's intervention, pt appeared calmer; sitting calmly in chair, answering questions, and maintaining eye contact.

## 2010-12-12 DIAGNOSIS — R609 Edema, unspecified: Secondary | ICD-10-CM

## 2010-12-12 LAB — GLUCOSE, CAPILLARY
Glucose-Capillary: 123 mg/dL — ABNORMAL HIGH (ref 70–99)
Glucose-Capillary: 111 mg/dL — ABNORMAL HIGH (ref 70–99)
Glucose-Capillary: 95 mg/dL (ref 70–99)
Glucose-Capillary: 102 mg/dL — ABNORMAL HIGH (ref 70–99)
Glucose-Capillary: 109 mg/dL — ABNORMAL HIGH (ref 70–99)

## 2010-12-12 LAB — BASIC METABOLIC PANEL
BUN: 11 mg/dL (ref 6–23)
CO2: 28 mEq/L (ref 19–32)
Chloride: 100 mEq/L (ref 96–112)
GFR calc Af Amer: >90 mL/min (ref >90)
Potassium: 4.6 mEq/L (ref 3.5–5.1)

## 2010-12-12 LAB — CULTURE, BLOOD (ROUTINE X 2): Culture  Setup Time: 201211012327

## 2010-12-12 MED ORDER — DEXTROSE-NACL 5-0.9 % IV SOLN
INTRAVENOUS | Status: DC
  Administered 2010-12-12 – 2010-12-22 (×11): via INTRAVENOUS

## 2010-12-12 NOTE — Progress Notes (Signed)
Physical Therapy Treatment Patient Details Name: JAKEOB TULLIS MRN: [id number] DOB: 07/25/1965 Today's Date: 12/12/2010  PT Assessment/Plan  PT - Assessment/Plan PT Plan: Discharge plan remains appropriate Equipment Recommended: Defer to next venue PT Goals  Acute Rehab PT Goals PT Goal: Rolling Supine to Right Side - Progress: Progressing toward goal PT Goal: Rolling Supine to Left Side - Progress: Progressing toward goal  PT Treatment Precautions/Restrictions  Precautions Precautions: Fall Precaution Comments: morbid obesity Required Braces or Orthoses: No Restrictions Weight Bearing Restrictions: No Mobility (including Balance) Bed Mobility Bed Mobility: Yes Rolling Right: 3: Mod assist Rolling Right Details (indicate cue type and reason): Mod verbal cues to initiate roll and to reach with UE to complete roll Rolling Left: 3: Mod assist Transfers Transfers: No Ambulation/Gait Ambulation/Gait: No    Exercise    End of Session PT - End of Session Activity Tolerance:  (Pt not feeling well and did not want to do up activity. ) Patient left: in bed Nurse Communication: Other (comment) (Nurse aware that patient did not feel well enough for PT) General Behavior During Session: Lethargic Cognition: WFL for tasks performed  Ahniya Mitchum, Eliseo Gum 12/12/2010, 1:15 PM

## 2010-12-12 NOTE — Progress Notes (Signed)
CSW made a referral to Financial Counselors to inquire about pt applying for disability. CSW to continue to follow.

## 2010-12-12 NOTE — Progress Notes (Addendum)
  Subjective: The pt continues to have fevers daily of unknown origin.  He is being followed by ID, and the current plan is to follow for now.  He is not weaning well on the vent.  No new issues overnight. Requires full support. Plan fpr swallow eval 11/6. FEES ++ dysphagia  Objective: Vital signs in last 24 hours: Temp:  [97.7 F (36.5 C)-101.5 F (38.6 C)] 97.7 F (36.5 C) (11/07 0814) Pulse Rate:  [96-105] 97  (11/07 0814) Resp:  [11-28] 11  (11/07 0814) BP: (105-140)/(61-83) 140/83 mmHg (11/07 0814) SpO2:  [98 %-100 %] 100 % (11/07 0814) FiO2 (%):  [39.9 %-40.1 %] 40 % (11/07 0814) Weight:  [371 lb 11.1 oz (168.6 kg)] 371 lb 11.1 oz (168.6 kg) (11/07 0006)  Intake/Output from previous day: 11/06 0701 - 11/07 0700 In: 950.3 [I.V.:950.3] Out: 900 [Urine:900] Intake/Output this shift:     Physical Exam: Obese male in nad Chest with diffuse rhonchi, a few basilar crackles.  No wheezing Cor with rrr abd soft, nontender, bs pos Condom cath LE with mild edema  Lab Results:  [facility] 12/10/10 2218  WBC 13.8*  HGB 8.2*  HCT 25.2*  PLT 201   BMET  Basename 12/12/10 0520 12/11/10 0410 12/10/10 2218  NA 140 144 144  K 4.6 3.5 3.5  CL 100 102 102  CO2 28 32 31  GLUCOSE 106* 115* 114*  BUN 11 12 12   CREATININE 0.97 1.08 1.08  CALCIUM 8.9 8.9 8.7    Studies/Results: 11/6 FEES ++ dysphagia  Assessment/Plan: Patient Active Hospital Problem List: Acute and chronic respiratory failure    Assessment: the pt is not weaning well on psv, and not making a lot of progress.  His fevers may be contributing to this.  (per ID)   Plan:continue with PSV wean as tolerated, but likely will need LTAC  Obesity hypoventilation syndrome (12/09/2010)   Assessment: see above   Plan: see above  Hypernatremia (12/09/2010)   Assessment: the pt is responding to free water IV, being chased with prn diuretic.  He refuses panda placement.  At some point, nutrition will need to be addressed.    Plan: continue d5w with prn diuretic  Fever (12/09/2010)   Assessment: this has been an ongoing issue.  ID following, and we have decided to follow off abx.  Cdiff is being rechecked.  Will check cxr in am, and reculture if continues to have spikes.   Plan: as above.    Sacral Wound: Plan Followed by [facility] no surgical interventions required. Malnourished:Dysphagia Plan: Swallow eval for 11/6++dysphagia Place panda with future plan for PEG Placement: Plan: Seek placement in ltac with vent capability.   Will have extenisve d/w pt , so to undersatnd importance for tube feeds and nutrition needs Continue PS attempts  MINOR,WILLIAM S 12/12/2010, 9:10 AM  Please note that we are having an objective swallow testing done today with cuff up on vent to ensure safety for swallowing Continue PS 18 attempts

## 2010-12-12 NOTE — Progress Notes (Signed)
Speech Language Pathology SLP treatment cancelled today due to patient refusal to engage to complete education about POC, coupled with discussion with N.P. Brett Canales Minor) and chart review which revealed need for inflated cuffed trach (unable to proceed with PMSV in-line at this time).

## 2010-12-12 NOTE — Progress Notes (Signed)
This patient was discussed at long LOS rounds this morning. 

## 2010-12-12 NOTE — Progress Notes (Signed)
Dr. Lazarus Salines came this morning to checked patient's trach and he removed the trach suture.  Patient asked MD if he can have something to eat.  MD courteously informed patient that it is not his call to order diet,  I informed patient that at this time, he is not able to eat anything.  Patient is upset and gestured to get out of his room.

## 2010-12-12 NOTE — Progress Notes (Signed)
CSW met with pt at bedside with Dietician.  CSW provided emotional support and encouragement.  CSW provided opportunity for pt to process difficult feelings associated with hospitalizations.  Pt stated he was upset that he did not give permission for trach and panda tube.  CSW stated that pt was sedated and medical personnel acted with best interests.  CSW stated that without nutrition, pt would not be able improve his health.  CSW provided opportunity for pt to have some space.    CSW returned approximately 30 minutes later.  Pt had two visitors in his room who identified themselves as friends and "case managers".  These friends are members of the pt's church and were able to provide additional history information.  CSW received permission from pt to speak openly about pt's current medical condition; pt consented.  CSW discussed with the friends that the pt was refusing nutrition assistance.  Friends encouraged pt to accept nutrition assistance and to follow medical advice.  Friends were able to provide emotional support; pt consented to nutrition assistance.  RN and MD informed.  Friends inquired about pt's insurance and SNF bed.  CSW stated that some confusion seems to be present regarding pt's disability approval.  Friends agreed to phone the SNF and inquire about disability paperwork.  CSW stated that pt's SNF was willing to extend a bed offer once medically stable if pt could, at the minimum, be on a cpap with trach.  Friends again encouraged pt to follow medical advice and to "try and get better".   CSW phoned financial counselors to inquire about disability and Medicare.  Pt does not have Medicare and, per financial counselor, last claim to disability was in 2010.  CSW to continue to follow and assist as needed.

## 2010-12-12 NOTE — Progress Notes (Signed)
INFECTIOUS DISEASE PROGRESS NOTE  ID: [name] is a 45 y.o. male who is morbidly obese, who has OHS/OSA presented with scrotal edema, and acute on chronic respiratory failure s/p trach. Has had ongoing fevers, blood cultures negative. FUO work-up shows abn SPEP  Subjective: Febrile to 100.52F , lower than the day prior. No associated tachycardia nor hypotension. Patient c/o low back pain. No fevers/chills/NS  Abtx:  none Medications:     . antiseptic oral rinse  15 mL Mouth Rinse QID  . chlorhexidine  15 mL Mouth/Throat BID  . collagenase   Topical Daily  . feeding supplement  30 mL Oral 6 X Daily  . heparin subcutaneous  5,000 Units Subcutaneous Q8H  . insulin aspart  0-20 Units Subcutaneous Q4H  . insulin glargine  15 Units Subcutaneous QHS  . metoprolol  2.5 mg Intravenous Q8H  . nystatin  15 mL Oral QID  . pantoprazole (PROTONIX) IV  40 mg Intravenous Q24H  . sodium chloride  10 mL Intravenous Q12H    Objective: Vital signs in last 24 hours: Temp:  [97.7 F (36.5 C)-100.4 F (38 C)] 98 F (36.7 C) (11/07 2000) Pulse Rate:  [97-109] 109  (11/07 2038) Resp:  [11-27] 21  (11/07 2038) BP: (119-140)/(65-83) 119/65 mmHg (11/07 2038) SpO2:  [96 %-100 %] 96 % (11/07 2038) FiO2 (%):  [39.7 %-40 %] 40 % (11/07 2038) Weight:  [168.6 kg (371 lb 11.1 oz)] 371 lb 11.1 oz (168.6 kg) (11/07 0006)  General Appearance:    Alert, cooperative, no distress, appears stated age  Head:    Normocephalic, without obvious abnormality, atraumatic  Eyes:    PERRL, conjunctiva/corneas clear, EOM's intact, fundi    benign, both eyes          Nose:   Nares normal, septum midline, mucosa normal, no drainage   or sinus tenderness  Throat:   Trach in place; teeth and gums normal  Neck:   Supple, symmetrical, trach piec is midline, no adenopathy;        no carotid bruit or JVD     Lungs:     Clear to auscultation bilaterally, respirations unlabored  Chest wall:    No tenderness or deformity    Heart:    Distant heart sounds, RRR, S1 and S2 normal, no g/m/r  Abdomen:     Protuberant abdomen, bowel sounds active all four quadrants,    no masses, no organomegaly  Genitalia:   has [place] condom catheter in plac     Extremities:   Extremities normal, atraumatic, no cyanosis or edema  Pulses:   2+ and symmetric all extremities  Skin:   Skin color, dry, turgor normal; has large pressure ulcer, unstageable to his buttock, for the majority it is shallow has good granulation tissue. A few areas near the cleft appears necrotic, depth unable to be assessed          Lab Results CBC CBC    Component Value Date/Time   WBC 13.8* 12/10/2010 2218   RBC 3.35* 12/10/2010 2218   HGB 8.2* 12/10/2010 2218   HCT 25.2* 12/10/2010 2218   PLT 201 12/10/2010 2218   MCV 75.2* 12/10/2010 2218   MCH 24.5* 12/10/2010 2218   MCHC 32.5 12/10/2010 2218   RDW 16.8* 12/10/2010 2218   LYMPHSABS 2.1 12/10/2010 2218   MONOABS 1.2* 12/10/2010 2218   EOSABS 0.4 12/10/2010 2218   BASOSABS 0.0 12/10/2010 2218     CMP     Component Value  Date/Time   NA 140 12/12/2010 0520   K 4.6 12/12/2010 0520   CL 100 12/12/2010 0520   CO2 28 12/12/2010 0520   GLUCOSE 106* 12/12/2010 0520   BUN 11 12/12/2010 0520   CREATININE 0.97 12/12/2010 0520   CALCIUM 8.9 12/12/2010 0520   PROT 7.0 12/01/2010 0400   ALBUMIN 2.6* 12/01/2010 0400   AST 187* 12/01/2010 0400   ALT 54* 12/01/2010 0400   ALKPHOS 47 12/01/2010 0400   BILITOT 0.8 12/01/2010 0400   GFRNONAA >90 12/12/2010 0520   GFRAA >90 12/12/2010 0520   hiv negative RF negative ANA is weakly positive 1:40 SPEP is abn with alpha 1 globulin 7.4; alpha 2 globulin 17.2 ESR 66 Microbiology: cdiff negative Blood cultures negative Studies/Results: cxr 11/05 per my read: patient still has pulmonary edema  Assessment/Plan: FUO 2/2 SPEP abnormality vs. untreated source of infection . 1) abn SPEP: i have ordered IFE which is a more sensitive test to look into abnormalities found on  SPEP. Please consider getting heme consult to weigh in on SPEP interpretation and as to whether his fevers could be due to malignancy. 2) buttock pressure ulcer= does not look significant enough to be source of infection, although there are a few small areas of necrosis. Would not recommend pelvis CT at this time.   Eldred Lievanos Infectious Diseases 12/12/2010, 11:19 PM

## 2010-12-12 NOTE — Progress Notes (Signed)
Consult to initiate and manage TF  Diet: NPO.  Failed FEES 11/6.  Pt refused SLP treatment today.   Pt with trach and continued support.  Wt. 168.6kg (371#)     385#=UBW Labs noted Protein supplement and free water held due to lack of access.  IVF=d5 1/2NS@20ml /hr  Large wound on sacrum.  Pt. Interaction Social worker present.  Had long discussion with patient.  Patient very upset.  Upset that he has trach tube "no one asked me".   Wants to eat and refusing Panda and PEG.  Discussed that eating was unsafe and panda needed for nutrition.  Strongly refusing panda.  Nutrition Dx:  Inadequate oral intake continues.  Pt npo and refusing panda/PEG.  Goal:  Based on plan of care.  Plan:  Consider meeting with pt to address goals of care  When access for feeding, please reconsult or begin Osmolite 1.2,  Goal of 40 and resume prostat 6x/day  Will continue to monitor plan of care.  Oran Rein, RD  [phone]

## 2010-12-12 NOTE — Progress Notes (Signed)
7 NOV 12 [id number]   Patient much more alert than last time I saw him.  Responding appropriately to commands.   Trach secure.   Sutures eroding and removed.  Still on vent and requires cuffed tube.  Cuff intact.  Tube reasonably clean.  I will exchange tube first time when he is ready for cuffless tube.  Keep trach ties snugly tied.  If he is able to go for intervals without pressure support, could use [employer] valve for speaking.

## 2010-12-12 NOTE — Progress Notes (Signed)
Occupational Therapy Treatment Patient Details Name: [name] MRN: [id number] DOB: 04/06/1965 Today's Date: 12/12/2010  OT Assessment/Plan OT Assessment/Plan Comments on Treatment Session: Pt. feeling fatigued today due to NPO and refusing panda placement and fever causing decreased energy to participate OT Plan: Discharge plan remains appropriate OT Frequency: Min 1X/week Follow Up Recommendations: LTACH Equipment Recommended: Defer to next venue OT Goals Acute Rehab OT Goals OT Goal Formulation: With patient Time For Goal Achievement: 2 weeks  OT Treatment Precautions/Restrictions  Precautions Precautions: Fall Precaution Comments: morbid obesity Required Braces or Orthoses: No Restrictions Weight Bearing Restrictions: No   ADL ADL Eating/Feeding: NPO Grooming: Performed;Wash/dry face;Set up Where Assessed - Grooming: Supine, head of bed up Upper Body Bathing: Performed;Chest;Left arm;Maximal assistance;Right arm;Abdomen (Pt. able to wash upper chest and upper left UE) Where Assessed - Upper Body Bathing: Supine, head of bed up Lower Body Bathing: Not assessed Upper Body Dressing: Performed;Moderate assistance Upper Body Dressing Details (indicate cue type and reason): Assist for pt. to don right sleeve and pull gown up whole arm Where Assessed - Upper Body Dressing: Supine, head of bed up Lower Body Dressing: Not assessed Toilet Transfer: Not assessed Toileting - Clothing Manipulation: Not assessed Toileting - Hygiene: Not assessed ADL Comments: Pt. with decreased activity tolerance today and fatiguing quickle due to lethargy. Mobility  Bed Mobility Bed Mobility: Yes Rolling Right: 4: Min assist;With rail Rolling Right Details (indicate cue type and reason): Mod verbal cues to initiate roll and to reach with UE to complete roll Rolling Left: 4: Min assist;Other (comment) Transfers Transfers: No    End of Session OT - End of Session Activity Tolerance:  Patient limited by fatigue;Patient limited by pain Patient left: in bed;with call bell in reach Nurse Communication: Other (comment) (Activity tolerance with ADLs) General Behavior During Session: Lethargic Cognition: WFL for tasks performed  Co-tx with P.T. Kenric Ginger,OTR/L  12/12/2010, 12:43 PM

## 2010-12-13 ENCOUNTER — Inpatient Hospital Stay (HOSPITAL_COMMUNITY): Payer: Medicare Other

## 2010-12-13 DIAGNOSIS — A419 Sepsis, unspecified organism: Secondary | ICD-10-CM

## 2010-12-13 DIAGNOSIS — E662 Morbid (severe) obesity with alveolar hypoventilation: Secondary | ICD-10-CM

## 2010-12-13 DIAGNOSIS — J9600 Acute respiratory failure, unspecified whether with hypoxia or hypercapnia: Secondary | ICD-10-CM

## 2010-12-13 DIAGNOSIS — R609 Edema, unspecified: Secondary | ICD-10-CM

## 2010-12-13 LAB — GLUCOSE, CAPILLARY
Glucose-Capillary: 114 mg/dL — ABNORMAL HIGH (ref 70–99)
Glucose-Capillary: 100 mg/dL — ABNORMAL HIGH (ref 70–99)
Glucose-Capillary: 95 mg/dL (ref 70–99)

## 2010-12-13 LAB — BASIC METABOLIC PANEL
Calcium: 9.1 mg/dL (ref 8.4–10.5)
GFR calc Af Amer: >90 mL/min (ref >90)
GFR calc non Af Amer: >90 mL/min (ref >90)
Sodium: 145 mEq/L (ref 135–145)

## 2010-12-13 LAB — CBC
Platelets: 257 10*3/uL (ref 150–400)
RBC: 3.3 MIL/uL — ABNORMAL LOW (ref 4.22–5.81)
WBC: 14.2 10*3/uL — ABNORMAL HIGH (ref 4.0–10.5)

## 2010-12-13 LAB — URINE MICROSCOPIC-ADD ON

## 2010-12-13 LAB — URINALYSIS, ROUTINE W REFLEX MICROSCOPIC
Protein, ur: 30 mg/dL — AB
Urobilinogen, UA: 2 mg/dL — ABNORMAL HIGH (ref 0.0–1.0)

## 2010-12-13 LAB — DIFFERENTIAL
Eosinophils Absolute: 0.3 10*3/uL (ref 0.0–0.7)
Lymphocytes Relative: 17 % (ref 12–46)
Lymphs Abs: 2.4 10*3/uL (ref 0.7–4.0)
Neutrophils Relative %: 70 % (ref 43–77)

## 2010-12-13 MED ORDER — CEFAZOLIN SODIUM 1-5 GM-% IV SOLN: 1.0000 g | Freq: Once | INTRAVENOUS | Status: DC

## 2010-12-13 MED ORDER — SODIUM CHLORIDE 0.9 % IJ SOLN
10.0000 mL | Freq: Two times a day (BID) | INTRAMUSCULAR | Status: DC
  Administered 2010-12-13 – 2010-12-26 (×26): 10 mL

## 2010-12-13 MED ORDER — FUROSEMIDE 10 MG/ML IJ SOLN
40.0000 mg | Freq: Two times a day (BID) | INTRAMUSCULAR | Status: AC
  Administered 2010-12-13 – 2010-12-15 (×4): 40 mg via INTRAVENOUS
  Filled 2010-12-13 (×3): qty 4

## 2010-12-13 MED ORDER — POTASSIUM CHLORIDE 10 MEQ/50ML IV SOLN
10.0000 meq | INTRAVENOUS | Status: AC
  Administered 2010-12-13 (×3): 10 meq via INTRAVENOUS
  Filled 2010-12-13 (×3): qty 50

## 2010-12-13 MED ORDER — HEPARIN SODIUM (PORCINE) 5000 UNIT/ML IJ SOLN
5000.0000 [IU] | Freq: Three times a day (TID) | INTRAMUSCULAR | Status: DC
  Administered 2010-12-14 – 2010-12-26 (×35): 5000 [IU] via SUBCUTANEOUS
  Filled 2010-12-13 (×39): qty 1

## 2010-12-13 MED ORDER — SODIUM CHLORIDE 0.9 % IJ SOLN
10.0000 mL | INTRAMUSCULAR | Status: DC | PRN
  Administered 2010-12-22 – 2010-12-23 (×2): 10 mL

## 2010-12-13 MED ORDER — SODIUM CHLORIDE 0.9 % IJ SOLN: 10.0000 mL | Freq: Two times a day (BID) | INTRAMUSCULAR | Status: DC

## 2010-12-13 MED ORDER — SODIUM CHLORIDE 0.9 % IJ SOLN: 10.0000 mL | INTRAMUSCULAR | Status: DC | PRN

## 2010-12-13 MED ORDER — WHITE PETROLATUM GEL
Status: AC
  Filled 2010-12-13: qty 5

## 2010-12-13 NOTE — Progress Notes (Signed)
Received DME Tube feeding order in CM office. Pt is not being d/c home at this time and Tube feedings were for the unit not home. Bedside nurse made aware and stated that patient has been getting tube feedings on unit. Genella Rife Weeks 12/13/2010 945am

## 2010-12-13 NOTE — H&P (Signed)
[name] is an 45 y.o. male.   Chief Complaint: intubated - dysphagia  HPI: 45 yo who failed his swallowing eval. Gastrostomt tube placement requested. Informed consent obtained  Past Medical History  Diagnosis Date  . Hypertension   . CHF (congestive heart failure)   . Ventilator dependent     No past surgical history on file.  No family history on file. Social History:  does not have a smoking history on file. He does not have any smokeless tobacco history on file. His alcohol and drug histories not on file.  Allergies:  Allergies  Allergen Reactions  . Tramadol Swelling    Medications Prior to Admission  Medication Dose Route Frequency Provider Last Rate Last Dose  . acetaminophen (TYLENOL) solution 650 mg  650 mg Per Tube Q6H PRN Rakesh V. Vassie Loll, MD      . acetaminophen (TYLENOL) tablet 650 mg  650 mg Oral Q6H PRN Rakesh V. Vassie Loll, MD      . albuterol (PROVENTIL) (5 MG/ML) 0.5% nebulizer solution 2.5 mg  2.5 mg Nebulization Q3H PRN Rakesh V. Vassie Loll, MD      . antiseptic oral rinse (BIOTENE) solution 15 mL  15 mL Mouth Rinse QID Rakesh V. Vassie Loll, MD   15 mL at 12/13/10 1200  . bisacodyl (DULCOLAX) suppository 10 mg  10 mg Rectal Daily PRN Rakesh V. Vassie Loll, MD      . chlorhexidine (PERIDEX) 0.12 % solution 15 mL  15 mL Mouth/Throat BID Rakesh V. Vassie Loll, MD   15 mL at 12/13/10 0800  . collagenase (SANTYL) ointment   Topical Daily Rakesh V. Vassie Loll, MD      . dextrose 5 %-0.9 % sodium chloride infusion   Intravenous Continuous Mcarthur Rossetti. Feinstein 50 mL/hr at 12/12/10 1940    . feeding supplement (PRO-STAT SUGAR FREE 64) liquid 30 mL  30 mL Oral 6 X Daily Dellia Cloud [place], PHARMD      . fentaNYL (SUBLIMAZE) injection 25-50 mcg  25-50 mcg Intravenous Q2H PRN Rakesh V. Vassie Loll, MD   50 mcg at 12/13/10 0357  . haloperidol lactate (HALDOL) injection 5 mg  5 mg Intravenous Once Cidney Hulett   5 mg at 12/09/10 0545  . heparin injection 5,000 Units  5,000 Units Subcutaneous Q8H Rakesh V. Vassie Loll,  MD   5,000 Units at 12/13/10 0528  . hydroxypropyl methylcellulose (ISOPTO TEARS) 2.5 % ophthalmic solution 1 drop  1 drop Both Eyes Q4H PRN Rakesh V. Vassie Loll, MD      . insulin aspart (novoLOG) injection 0-20 Units  0-20 Units Subcutaneous Q4H Rakesh V. Vassie Loll, MD   3 Units at 12/13/10 0414  . insulin glargine (LANTUS) injection 15 Units  15 Units Subcutaneous QHS Rakesh V. Vassie Loll, MD   15 Units at 12/12/10 2232  . iohexol (OMNIPAQUE) 300 MG/ML injection 100 mL  100 mL Intravenous Once PRN Medication Radiologist   100 mL at 12/06/10 0105  . LORazepam (ATIVAN) injection 2 mg  2 mg Intravenous Once Cidney Hulett   2 mg at 12/09/10 0545  . metoprolol (LOPRESSOR) injection 2.5 mg  2.5 mg Intravenous Q8H Rakesh V. Vassie Loll, MD   2.5 mg at 12/13/10 0529  . midazolam (VERSED) 5 MG/5ML injection 2-4 mg  2-4 mg Intravenous Q2H PRN Rakesh V. Vassie Loll, MD   2 mg at 12/13/10 0357  . nystatin (MYCOSTATIN) 100000 UNIT/ML suspension 1,500,000 Units  15 mL Oral QID Rakesh V. Vassie Loll, MD   1,500,000 Units at 12/13/10 [phone]  . ondansetron (ZOFRAN) injection  4 mg  4 mg Intravenous Q6H PRN Rakesh V. Vassie Loll, MD      . pantoprazole (PROTONIX) injection 40 mg  40 mg Intravenous Q24H Rakesh V. Vassie Loll, MD   40 mg at 12/12/10 2228  . potassium chloride 10 mEq in 100 mL IVPB  10 mEq Intravenous Q1 Hr x 4 Barbaraann Share, MD   10 mEq at 12/09/10 1930  . potassium chloride 10 mEq in 100 mL IVPB  10 mEq Intravenous Q1 Hr x 4 William S Minor, NP   10 mEq at 12/10/10 1641  . sodium chloride 0.9 % injection 10 mL  10 mL Intravenous Q12H Elizabeth Deterding   10 mL at 12/12/10 2234  . DISCONTD: 0.9 %  sodium chloride infusion   Intravenous Continuous Rakesh V. Vassie Loll, MD      . DISCONTD: dextrose 10 % infusion   Intravenous Continuous Rakesh V. Vassie Loll, MD      . DISCONTD: dextrose 5 % solution   Intravenous Continuous Rakesh V. Vassie Loll, MD      . DISCONTD: dextrose 5 % solution   Intravenous Continuous Annia Belt, PHARMD 75 mL/hr at 12/11/10 0303    .  DISCONTD: dextrose 5 %-0.45 % sodium chloride infusion   Intravenous Continuous Mcarthur Rossetti. Feinstein 20 mL/hr at 12/11/10 1428    . DISCONTD: free water 300 mL  300 mL Per Tube Q6H Rakesh V. Vassie Loll, MD      . DISCONTD: insulin aspart (novoLOG) injection 0-7 Units  0-7 Units Subcutaneous Q4H Lonia Blood      . DISCONTD: protein supplement (PROSOURCE NO CARB) liquid 30 mL  30 mL Per Tube Q4H Rakesh V. Vassie Loll, MD      . DISCONTD: sodium chloride 0.9 % injection 3 mL  3 mL Intravenous Q12H Rakesh V. Vassie Loll, MD   10 mL at 12/10/10 2212   Medications Prior to Admission  Medication Sig Dispense Refill  . acetaminophen (TYLENOL) 500 MG tablet Take 1,000 mg by mouth 3 (three) times daily as needed. For pain        . albuterol (PROVENTIL) (2.5 MG/3ML) 0.083% nebulizer solution Take 2.5 mg by nebulization every 3 (three) hours as needed.        Marland Kitchen amLODipine (NORVASC) 5 MG tablet Take 5 mg by mouth every morning.        . ciprofloxacin (CIPRO) 500 MG tablet Take 500 mg by mouth 2 (two) times daily.        Marland Kitchen docusate sodium (COLACE) 100 MG capsule Take 100 mg by mouth 2 (two) times daily.        . fluticasone (FLONASE) 50 MCG/ACT nasal spray Place 1 spray into the nose daily.        Marland Kitchen guaiFENesin (MUCINEX) 600 MG 12 hr tablet Take 1,200 mg by mouth 2 (two) times daily.        Marland Kitchen HYDROcodone-acetaminophen (VICODIN) 5-500 MG per tablet Take 1 tablet by mouth every 4 (four) hours as needed. For pain       . hydroxypropyl methylcellulose (ISOPTO TEARS) 2.5 % ophthalmic solution Apply 1 drop to eye every 4 (four) hours as needed. For eyes       . loratadine (CLARITIN) 10 MG tablet Take 10 mg by mouth daily.        Marland Kitchen losartan (COZAAR) 50 MG tablet Take 50 mg by mouth daily.        . magnesium hydroxide (MILK OF MAGNESIA) 400 MG/5ML suspension Take 30 mLs by mouth daily  as needed. For constipation       . potassium chloride SA (K-DUR,KLOR-CON) 20 MEQ tablet Take 20 mEq by mouth 2 (two) times daily.        .  simvastatin (ZOCOR) 20 MG tablet Take 20 mg by mouth at bedtime.        Marland Kitchen tiotropium (SPIRIVA) 18 MCG inhalation capsule Place 18 mcg into inhaler and inhale every morning.        . torsemide (DEMADEX) 20 MG tablet Take 60 mg by mouth 2 (two) times daily.          Results for orders placed during the hospital encounter of 11/16/10 (from the past 48 hour(s))  GLUCOSE, CAPILLARY     Status: Abnormal   Collection Time   12/11/10  4:25 PM      Component Value Range Comment   Glucose-Capillary 112 (*) 70 - 99 (mg/dL)   GLUCOSE, CAPILLARY     Status: Abnormal   Collection Time   12/11/10  8:35 PM      Component Value Range Comment   Glucose-Capillary 116 (*) 70 - 99 (mg/dL)    Comment 1 Documented in Chart      Comment 2 Notify RN     GLUCOSE, CAPILLARY     Status: Abnormal   Collection Time   12/12/10 12:12 AM      Component Value Range Comment   Glucose-Capillary 123 (*) 70 - 99 (mg/dL)    Comment 1 Documented in Chart      Comment 2 Notify RN     GLUCOSE, CAPILLARY     Status: Abnormal   Collection Time   12/12/10  4:30 AM      Component Value Range Comment   Glucose-Capillary 111 (*) 70 - 99 (mg/dL)    Comment 1 Documented in Chart      Comment 2 Notify RN     BASIC METABOLIC PANEL     Status: Abnormal   Collection Time   12/12/10  5:20 AM      Component Value Range Comment   Sodium 140  135 - 145 (mEq/L)    Potassium 4.6  3.5 - 5.1 (mEq/L) HEMOLYSIS AT THIS LEVEL MAY AFFECT RESULT   Chloride 100  96 - 112 (mEq/L)    CO2 28  19 - 32 (mEq/L)    Glucose, Bld 106 (*) 70 - 99 (mg/dL)    BUN 11  6 - 23 (mg/dL)    Creatinine, Ser 8.65  0.50 - 1.35 (mg/dL)    Calcium 8.9  8.4 - 10.5 (mg/dL)    GFR calc non Af Amer >90  >90 (mL/min)    GFR calc Af Amer >90  >90 (mL/min)   GLUCOSE, CAPILLARY     Status: Abnormal   Collection Time   12/12/10  7:31 AM      Component Value Range Comment   Glucose-Capillary 110 (*) 70 - 99 (mg/dL)    Comment 1 Notify RN     GLUCOSE, CAPILLARY      Status: Normal   Collection Time   12/12/10 11:41 AM      Component Value Range Comment   Glucose-Capillary 95  70 - 99 (mg/dL)    Comment 1 Notify RN     GLUCOSE, CAPILLARY     Status: Abnormal   Collection Time   12/12/10  4:44 PM      Component Value Range Comment   Glucose-Capillary 102 (*) 70 - 99 (mg/dL)    Comment  1 Notify RN     GLUCOSE, CAPILLARY     Status: Abnormal   Collection Time   12/12/10  8:54 PM      Component Value Range Comment   Glucose-Capillary 109 (*) 70 - 99 (mg/dL)    Comment 1 Notify RN      Comment 2 Documented in Chart     GLUCOSE, CAPILLARY     Status: Abnormal   Collection Time   12/13/10  1:20 AM      Component Value Range Comment   Glucose-Capillary 114 (*) 70 - 99 (mg/dL)   GLUCOSE, CAPILLARY     Status: Abnormal   Collection Time   12/13/10  4:02 AM      Component Value Range Comment   Glucose-Capillary 125 (*) 70 - 99 (mg/dL)   URINALYSIS, ROUTINE W REFLEX MICROSCOPIC     Status: Abnormal   Collection Time   12/13/10  4:30 AM      Component Value Range Comment   Color, Urine AMBER (*) YELLOW  BIOCHEMICALS MAY BE AFFECTED BY COLOR   Appearance CLOUDY (*) CLEAR     Specific Gravity, Urine 1.017  1.005 - 1.030     pH 6.0  5.0 - 8.0     Glucose, UA NEGATIVE  NEGATIVE (mg/dL)    Hgb urine dipstick TRACE (*) NEGATIVE     Bilirubin Urine SMALL (*) NEGATIVE     Ketones, ur 40 (*) NEGATIVE (mg/dL)    Protein, ur 30 (*) NEGATIVE (mg/dL)    Urobilinogen, UA 2.0 (*) 0.0 - 1.0 (mg/dL)    Nitrite POSITIVE (*) NEGATIVE     Leukocytes, UA TRACE (*) NEGATIVE    URINE MICROSCOPIC-ADD ON     Status: Abnormal   Collection Time   12/13/10  4:30 AM      Component Value Range Comment   Squamous Epithelial / LPF FEW (*) RARE     WBC, UA 3-6  <3 (WBC/hpf)    RBC / HPF 3-6  <3 (RBC/hpf)    Bacteria, UA MANY (*) RARE    BASIC METABOLIC PANEL     Status: Abnormal   Collection Time   12/13/10  6:55 AM      Component Value Range Comment   Sodium 145  135 - 145  (mEq/L)    Potassium 3.3 (*) 3.5 - 5.1 (mEq/L) DELTA CHECK NOTED   Chloride 103  96 - 112 (mEq/L)    CO2 31  19 - 32 (mEq/L)    Glucose, Bld 104 (*) 70 - 99 (mg/dL)    BUN 9  6 - 23 (mg/dL)    Creatinine, Ser 1.61  0.50 - 1.35 (mg/dL)    Calcium 9.1  8.4 - 10.5 (mg/dL)    GFR calc non Af Amer >90  >90 (mL/min)    GFR calc Af Amer >90  >90 (mL/min)   CBC     Status: Abnormal   Collection Time   12/13/10  6:55 AM      Component Value Range Comment   WBC 14.2 (*) 4.0 - 10.5 (K/uL)    RBC 3.30 (*) 4.22 - 5.81 (MIL/uL)    Hemoglobin 8.1 (*) 13.0 - 17.0 (g/dL)    HCT 09.6 (*) 04.5 - 52.0 (%)    MCV 74.5 (*) 78.0 - 100.0 (fL)    MCH 24.5 (*) 26.0 - 34.0 (pg)    MCHC 32.9  30.0 - 36.0 (g/dL)    RDW 40.9 (*) 81.1 - 15.5 (%)  Platelets 257  150 - 400 (K/uL)   DIFFERENTIAL     Status: Abnormal   Collection Time   12/13/10  6:55 AM      Component Value Range Comment   Neutrophils Relative 70  43 - 77 (%)    Neutro Abs 10.0 (*) 1.7 - 7.7 (K/uL)    Lymphocytes Relative 17  12 - 46 (%)    Lymphs Abs 2.4  0.7 - 4.0 (K/uL)    Monocytes Relative 11  3 - 12 (%)    Monocytes Absolute 1.5 (*) 0.1 - 1.0 (K/uL)    Eosinophils Relative 2  0 - 5 (%)    Eosinophils Absolute 0.3  0.0 - 0.7 (K/uL)    Basophils Relative 0  0 - 1 (%)    Basophils Absolute 0.0  0.0 - 0.1 (K/uL)   GLUCOSE, CAPILLARY     Status: Abnormal   Collection Time   12/13/10  8:14 AM      Component Value Range Comment   Glucose-Capillary 114 (*) 70 - 99 (mg/dL)    Comment 1 Documented in Chart      Comment 2 Notify RN     GLUCOSE, CAPILLARY     Status: Normal   Collection Time   12/13/10 11:57 AM      Component Value Range Comment   Glucose-Capillary 95  70 - 99 (mg/dL)    Comment 1 Documented in Chart      Comment 2 Notify RN      Dg Chest Portable 1 View  12/13/2010  *RADIOLOGY REPORT*  Clinical Data: Evaluate tracheostomy tube positioning  PORTABLE CHEST - 1 VIEW  Comparison: 12/10/2010; 12/05/2010; chest CT - 12/06/2010   Findings:  Examination minimally degraded by patient body habitus, portable technique and exclusion of the bilateral hemidiaphragms.  Grossly unchanged enlarged cardiac silhouette and mediastinal contours. Tracheostomy tube overlies tracheal air column.  Right upper extremity approach PICC line is malpositioned, coiled likely within the central aspect the right internal jugular vein.  The pulmonary vasculature remains indistinct, worsened in the interval.  Grossly unchanged perihilar and bibasilar opacities.  No definite pneumothorax. Evaluation for pleural fluid effusion is limited secondary exclusion of the bilateral hemidiaphragms.  Grossly unchanged bones.  IMPRESSION: 1.  Findings suggestive of worsening pulmonary edema and atelectasis. 2.  Malpositioned right upper extremity approach PICC line, likely coiled within the right internal jugular vein.  Above findings discussed with Gershon Mussel, RN at [phone].  Original Report Authenticated By: Waynard Reeds, M.D.    Review of Systems  Unable to perform ROS: intubated    Blood pressure 124/73, pulse 101, temperature 100.1 F (37.8 C), temperature source Oral, resp. rate 15, height 5\' 6"  (1.676 m), weight 375 lb 3.6 oz (170.2 kg), SpO2 97.00%. Physical Exam Pt is alert but intubated.  Airway 2 Heart - RRR Lungs - Rhonchi   Assessment/Plan Gastrostomy tube requested for severe dyshagia.  Ben Sanz 12/13/2010, 2:04 PM

## 2010-12-13 NOTE — Progress Notes (Signed)
Subjective: The pt continues to have fevers daily of unknown origin.  He is being followed by ID and currently recommending follow and CT of pelvis due to buttock pressure ulcer.  He is not weaning well on the vent.  No new issues overnight. Requires full support.   Swallow evaluation on 12/11/10 failed. Patient still want something to eat despite knowing his failed swallow test.  Waiting on IR to place feeding tube for nutritional support and this is currently planned for tomorrow 12/14/10.  Patient's right catheter is once again became dysfunction now requiring another line to place on left side by IV team.  Objective: Vital signs in last 24 hours: Temp:  [98 F (36.7 C)-100.5 F (38.1 C)] 100.1 F (37.8 C) (11/08 1159) Pulse Rate:  [99-109] 101  (11/08 1300) Resp:  [15-35] 15  (11/08 1300) BP: (100-136)/(46-89) 124/73 mmHg (11/08 1300) SpO2:  [96 %-100 %] 97 % (11/08 1300) FiO2 (%):  [39.6 %-40.3 %] 39.6 % (11/08 1300) Weight:  [170.2 kg (375 lb 3.6 oz)] 375 lb 3.6 oz (170.2 kg) (11/08 0000)  Intake/Output from previous day: 11/07 0701 - 11/08 0700 In: 1081 [P.O.:400; I.V.:681] Out: 1300 [Urine:1300]  Intake/Output this shift: Total I/O In: 213.3 [I.V.:213.3] Out: 200 [Urine:200]   Physical Exam: General:  Obese male in no acute distress but is upset because he is not allow to eat HEENT:  NCAT, EOMI, nonicteric sclera, no JVD, no LAD, no discharge LUNGS:  Coarse due to tracheotomy and vent, no wheeze HEART:  RRR, no murmurs/rubs/gallops ABD:  Obesely distended, (+)BS, soft, nontender, no guarding EXTR:  Intact, no cyanosis, (+1) edema bilatrally  Lab Results:  [facility] 12/13/10 0655 12/10/10 2218  WBC 14.2* 13.8*  HGB 8.1* 8.2*  HCT 24.6* 25.2*  PLT 257 201   BMET  Basename 12/13/10 0655 12/12/10 0520 12/11/10 0410  NA 145 140 144  K 3.3* 4.6 3.5  CL 103 100 102  CO2 31 28 32  GLUCOSE 104* 106* 115*  BUN 9 11 12   CREATININE 0.98 0.97 1.08  CALCIUM 9.1 8.9 8.9      Studies/Results: 12/11/10 FEES ++ dysphagia  Portable Chest 1 View:  1. Findings suggestive of worsening pulmonary edema and atelectasis 2. Malpositioned right upper extremity PICC line   Major Event: Right arm PICC lines became dysfunction - flip to IJ Left arm PICC line placed 12/13/10 >>>  Assessment/Plan:  1.  Acute and chronic respiratory failure  Assessment:  Not weaning well on PSV, and not making a lot of progress.  His fevers may be contributing to this.  (per ID)  Plan:  Continue with PSV wean as tolerated, but likely will need LTAC Did better on PS today, may need neg balance further see pcxr  2.  Obesity hypoventilation syndrome (12/09/2010)  Assessment: See above   Plan: I explained likley  benfit life long trach Needs peg, planned  3.  Hypernatremia (12/09/2010) Assessment:  Ressolved Plan:  Will follows BMET as needed Lost pic, replace then replace K  4.  Fever (12/09/2010) Assessment: this has been an ongoing issue.  ID following, and we have decided to follow off abx.   Plan: As above, continue to observe and follow ID plan for pelvis CT..will consider to combine with   IR procedure  5.  Sacral Wound Assessment:  Follows by ID Plan:  Will follow with ID plan for CT of pelvis for further assessment  6.  Feeding Assessment:  Feeding tube cannot be place today  planned for tomorrow.  Might need NGT for barium Plan:  Will follow along with IR on progress, peg planned am  Will have extenisve d/w pt , so to undersatnd importance for tube feeds and nutrition needs Continue PS attempts, lasix, K, new pICC  DANG, DONG 12/13/2010, 2:17 PM  Please note that we are having an objective swallow testing done today with cuff up on vent to ensure safety for swallowing Continue PS 18 attempts

## 2010-12-13 NOTE — Progress Notes (Signed)
CSW spoke with [facility] who is considering pt for a Vent SNF bed.  [facility] stated they will follow up with this CSW  Tomorrow (12/14/10) with an update.  CSW continues to follow.

## 2010-12-14 ENCOUNTER — Inpatient Hospital Stay (HOSPITAL_COMMUNITY): Payer: Medicare Other

## 2010-12-14 DIAGNOSIS — L89309 Pressure ulcer of unspecified buttock, unspecified stage: Secondary | ICD-10-CM

## 2010-12-14 DIAGNOSIS — D472 Monoclonal gammopathy: Secondary | ICD-10-CM | POA: Diagnosis present

## 2010-12-14 DIAGNOSIS — D649 Anemia, unspecified: Secondary | ICD-10-CM

## 2010-12-14 DIAGNOSIS — M359 Systemic involvement of connective tissue, unspecified: Secondary | ICD-10-CM

## 2010-12-14 DIAGNOSIS — R52 Pain, unspecified: Secondary | ICD-10-CM

## 2010-12-14 LAB — BASIC METABOLIC PANEL
BUN: 9 mg/dL (ref 6–23)
CO2: 33 mEq/L — ABNORMAL HIGH (ref 19–32)
Chloride: 98 mEq/L (ref 96–112)
Creatinine, Ser: 1 mg/dL (ref 0.50–1.35)

## 2010-12-14 LAB — GLUCOSE, CAPILLARY
Glucose-Capillary: 96 mg/dL (ref 70–99)
Glucose-Capillary: 106 mg/dL — ABNORMAL HIGH (ref 70–99)

## 2010-12-14 MED ORDER — POTASSIUM CHLORIDE 10 MEQ/50ML IV SOLN
INTRAVENOUS | Status: AC
  Filled 2010-12-14: qty 50

## 2010-12-14 MED ORDER — POTASSIUM CHLORIDE 10 MEQ/50ML IV SOLN
10.0000 meq | INTRAVENOUS | Status: AC
  Administered 2010-12-14 (×4): 10 meq via INTRAVENOUS
  Filled 2010-12-14 (×3): qty 50

## 2010-12-14 MED ORDER — IOHEXOL 300 MG/ML  SOLN
50.0000 mL | Freq: Once | INTRAMUSCULAR | Status: AC | PRN
  Administered 2010-12-14: 25 mL

## 2010-12-14 MED ORDER — CEFAZOLIN SODIUM 1-5 GM-% IV SOLN
1.0000 g | Freq: Three times a day (TID) | INTRAVENOUS | Status: DC
  Administered 2010-12-14 – 2010-12-17 (×9): 1 g via INTRAVENOUS
  Filled 2010-12-14 (×11): qty 50

## 2010-12-14 MED ORDER — CEFAZOLIN SODIUM 1-5 GM-% IV SOLN
1.0000 g | INTRAVENOUS | Status: AC
  Administered 2010-12-14: 1 g via INTRAVENOUS
  Filled 2010-12-14: qty 50

## 2010-12-14 MED ORDER — CEFAZOLIN SODIUM 1-5 GM-% IV SOLN
INTRAVENOUS | Status: AC
  Administered 2010-12-14: 1 g via INTRAVENOUS
  Filled 2010-12-14: qty 50

## 2010-12-14 MED ORDER — MIDAZOLAM HCL 5 MG/5ML IJ SOLN
INTRAMUSCULAR | Status: AC | PRN
  Administered 2010-12-14 (×4): 1 mg via INTRAVENOUS

## 2010-12-14 MED ORDER — FENTANYL CITRATE 0.05 MG/ML IJ SOLN
INTRAMUSCULAR | Status: AC | PRN
  Administered 2010-12-14: 50 ug via INTRAVENOUS
  Administered 2010-12-14 (×2): 25 ug via INTRAVENOUS
  Administered 2010-12-14: 50 ug via INTRAVENOUS

## 2010-12-14 NOTE — Consult Note (Addendum)
HPI: Reason for consult: Abnormal SPEP in patient with Fever of Unknown Origin Consulting MD: Dr. Dalene Carrow Date of Consultation: 12/14/2010  HPI: The patient is a 45 year old morbidly obese, resident of [place] with a history of chronic diastolic CHF, severe sleep apnea with obesity hypoventilation syndrome on BiPAP outpatient,, polysubstance and tobacco abuse, hypertension, dyslipidemia, osteoarthritis, and deconditioning who presented back in 11/16/2010 with exacerbation of CHF as well as scrotal hydrocele and cellulitis. Status complicated by MRSA and VDRF requiring intubation. Pt is currently in ICU , and has undergone a permanent trach placement and PEG tube today for nutrition. Pt has extended, daily FUO followed by ID.CXR 11/08 worsening pulmonary edema.   Labs obtained on admission reveals a H/H 13.4/37.8 respectively, WBC 19.6 with ANC 13. Plts 257. Sed rate 66. RA less than 10. ANA positive with speckled pattern.  The patient continues to be febrile, reaching temps up to 101.4 F for which we were asked to see to determine if the etiology of these fevers are related to M spike of 0.4 drawn on 12/07/2010.  Patient is HIV negative. Today's CBC shows H/H 8.1/24.6 WBC 14.2 ANC 10. Mono 1.5 and 2.4 lymphs. Bloos Cx neg.   Of note, pt developed large buttock pressure ulcers for which a new CT pelvis is pending to evaluate infected region which may be affecting blood counts, and accounting for fever.    PMH: Past Medical History  Diagnosis Date  . Hypertension   . CHF (congestive heart failure)   . Ventilator dependent     Surgeries: No past surgical history on file.  Allergies:  Allergies  Allergen Reactions  . Tramadol Swelling    Medications:   Scheduled:   . antiseptic oral rinse  15 mL Mouth Rinse QID  . ceFAZolin      . ceFAZolin (ANCEF) IV  1 g Intravenous On Call  . ceFAZolin (ANCEF) IV  1 g Intravenous Q8H  . chlorhexidine  15 mL Mouth/Throat BID  .  collagenase   Topical Daily  . feeding supplement  30 mL Oral 6 X Daily  . furosemide  40 mg Intravenous Q12H  . heparin subcutaneous  5,000 Units Subcutaneous Q8H  . insulin aspart  0-20 Units Subcutaneous Q4H  . insulin glargine  15 Units Subcutaneous QHS  . metoprolol  2.5 mg Intravenous Q8H  . nystatin  15 mL Oral QID  . pantoprazole (PROTONIX) IV  40 mg Intravenous Q24H  . potassium chloride  10 mEq Intravenous Q1 Hr x 3  . sodium chloride  10 mL Intravenous Q12H  . sodium chloride  10 mL Intracatheter Q12H  . white petrolatum      . DISCONTD: ceFAZolin (ANCEF) IV  1 g Intravenous Once  . DISCONTD: heparin subcutaneous  5,000 Units Subcutaneous Q8H  . DISCONTD: sodium chloride  10 mL Intracatheter Q12H     ZOX:[id number] (TYLENOL) oral liquid 160 mg/5 mL, acetaminophen, albuterol, bisacodyl, fentaNYL, fentaNYL, hydroxypropyl methylcellulose, iohexol, midazolam, midazolam, ondansetron, sodium chloride, DISCONTD: sodium chloride  JYN:WGNFAO of Systems  Constitutional: Positive for weight loss.Positive  for fever up to 103 on Nov 2, chills and malaise/fatigue.  Eyes: Negative for blurred vision and double vision.  Respiratory: Negative for cough, hemoptysis and positive for  shortness of breath.  Cardiovascular: Positive for chest pain. GI:  Dysphagia requiring swallow evaluation.No nausea, vomiting, diarrhea, constipation. No change in bowel caliber. No  Melena or  Hematochezia.  GU: No blood in urine. No loss of urinary control. Skin: Negative  for itching. No rash. No petechia. No bruising Neurological: No headaches. No motor or sensory deficits.   Family History: No family history on file.  Social History:  does not have a smoking history on file. He does not have any smokeless tobacco history on file. His alcohol and drug histories not on file.  Physical Exam  45 year old  Male  in no acute distress  General well-developed and well-nourished  Neurologic A. and O. x3,  no focal deficits  Somnolent today due to s/p procedure. HEENT: Normocephalic, atraumatic, PERRLA. Oral cavity without thrush or lesions. Neck tracheotomy in place with some secretions. voice coarse.  no thyromegaly, no cervical or supraclavicular adenopathy  Lungs clear bilaterally anteriorily, distant sounds. No wheezing, rhonchi or rales. No axillary masses. Breasts: not examined. Cardiac regular rate and rhythm normal S1-S2, no murmur , rubs or gallops Abdomen morbidly  obese,  nontender , bowel sounds x4 GU/rectal: deferred. Extremities no clubbing cyanosis , 1+edema. Skin: known large pressure buttock ulcer of about 15 cm occupying most of the R buttock, with some serosanguineous drainage. Minimal odor     Labs: As above, in addition:   CBC   Component  Value     WBC  14.2*     RBC  3.30*     HGB  8.1*     HCT  24.6*     PLT  257     MCV  74.5*     MCH  24.5*     MCHC  32.9     RDW  16.6*     LYMPHSABS  2.4     MONOABS  1.5*     EOSABS  0.3     BASOSABS  0.0     CMP    Component  Value     NA  144     K  3.1*     CL  98     CO2  33*     GLUCOSE  106*     BUN  9     CREATININE  1.00     CALCIUM  9.4     PROT  7.0     ALBUMIN  2.6*     AST  187*     ALT  54*     ALKPHOS  47     BILITOT  0.8       A/P: 45 y.o. male asked to see for evaluation of abnormal SPEP, with Mspike of 0.4 in a patient with multiple medical problems including FUO, and leukocytosis. New SPEP with [facility]E is pending, drawn on Nov 8th. Check smear, and UPEP with IFE Dr. Dalene Carrow is to see the patient following this consult with recommendations regarding diagnosis and further workup studies.  Thank you for the referral. An addendum to be dictated to this consult note with plans.   [facility] 12/14/2010 2:25 PM   I have seen and examined the patient and agree with above.  Laurice Record., M.D.    ADDENDUM:    Additional labs- BUN/creat 9/1.0, calcium 9.4. IgG 1060, IgA 588. IgM  96.  1.  Pt with fever of unknown origin with decubitus ulcer and reactive leucocytosis.  Hematology work up reveals an IgA gammopathy with normal renal function.  Calcium level is not elevated. Despite anemia, there is no evidence of acute blood loss.  Platelet level is within normal range.  Peripheral smear reveals left shift, therefore a reactive process is favored and I do not believe that  patient that a bone marrow biopsy is required at this time. If skeletal survey is unremarkable, we will conclude that pt has an IgA monoclonal gammopathy of undetermined significance. 2. Anemia - obtain anemia and hemolytic panel.  3.  Connective tissue disease with positive ANA with speckled pattern.  Yitzel Shasteen I.

## 2010-12-14 NOTE — Progress Notes (Signed)
Nutrition follow-up NPO failed FEES 11/6 Pt for placement of G-tube in IR today. Labs reviewed.   IVF:  D5ns@40ml /hr Wt. 163.9kg today.  Wt continuing to decrease. Large wound on sacrum. Fever of unknown cause  Nutrition Dx:  Inadequate oral intake continues. Goal:  Meet >90% estimated needs with enteral feeds via G-tube  Plan:  When OK for feeding to begin s/p G-tube placement,  Recommend begin Osmolite 1.2 at 20 ml/hr and increase 10 ml per 4 hours to goal  40 ml/hr and resume   Prostat 49ml/6 times per day to meet estimated needs and provide:  1588 kcals, 143g protein, free   water.  Monitor TF tolerance and adequacy.  [name], RD [phone]

## 2010-12-14 NOTE — Consult Note (Signed)
[name], [name] NO.:  [id number]  MEDICAL RECORD NO.:  [id number]  LOCATION:  2602                         FACILITY:  [facility]  PHYSICIAN:  Gardiner Barefoot, MD    DATE OF BIRTH:  1965-03-07  DATE OF CONSULTATION: DATE OF DISCHARGE:                                CONSULTATION   REFERRING PHYSICIAN:  Leslye Peer, MD  REASON FOR CONSULTATION:  Persistent fever and leukocytosis.  HISTORY OF PRESENT ILLNESS:  This is a 45 year old male, nursing home resident, with morbid obesity, diastolic CHF, obstructive sleep apnea, and hypoventilation syndrome, who came in about November 16, 2010, with scrotal swelling over the last week prior to admission.  The patient was admitted and diagnosed with cellulitis and started on broad-spectrum antibiotics.  He had, prior to admission, been started in the nursing home facility as well with antibiotics, but was not improving.  He also had some phimosis.  Ultrasound did not find any significant mass or torsion.  The patient was initially started on ciprofloxacin and vancomycin and then Zosyn was also added at that time, and continued with that for several days and vancomycin through November 30, 2010. Cefepime was also added on November 26, 2010, what appears to be for persistent fever and then this was changed to Primaxin.  Then, linezolid was added on November 30, 2010, and then Diflucan on November 29, 2010, for positive yeast in the urine.  Despite that, the patient has been persistently febrile with fevers rise up to about 102 on most days, and leukocytosis since admission as well.  No obvious source has been identified.  The patient has been in the ICU and did require intubation.  The patient was intubated around November 18, 2010, due to respiratory failure, multifactorial, and required a tracheostomy placement for failure to wean.  PAST MEDICAL HISTORY:  Morbid obesity, CHF, diastolic dysfunction, obstructive sleep  apnea, obesity hypoventilation syndrome, hypertension, dyslipidemia, osteoarthritis.  FAMILY HISTORY:  Per the record, mother has had heart problems and cancer.  The patient is unable to verbalize due to tracheostomy.  SOCIAL HISTORY:  The patient is a smoker with occasional alcohol and marijuana use.  ALLERGIES:  Tramadol which causes angioedema.  REVIEW OF SYSTEMS:  This is unobtainable from the patient.  PHYSICAL EXAMINATION:  VITAL SIGNS:  Temperature is 101.7, T-max is 102.8, blood pressure is 125/77, respiratory rate is 19, pulse is 98, and O2 sats 100% on trach. GENERAL:  The patient is awake, alert, and does seem to respond to questions. HEENT:  Anicteric.  No JVD. CARDIOVASCULAR:  Regular rate and rhythm without murmurs appreciated, distant. LUNGS:  Coarse breath sounds, but no wheezes. ABDOMEN:  Morbidly obese, soft, and nontender. EXTREMITIES:  With 1+ edema. SKIN:  His sacral area does show a stage I decubitus ulcer, but no signs of infection.  No erythema surrounding it and no pus.  LABS:  WBC is 16.4, hemoglobin 10.5, and platelets 218.  Creatinine is 1.20.  Abdominal x-ray shows unchanged bowel gas pattern.  Chest x-ray with low lung volumes and significant atelectasis.  ASSESSMENT AND PLAN:  This is a 46 year old male with multiple medical problems including respiratory  failure and persistent leukocytosis and fever of an unknown source.  1. Fever of an unknown origin.  At this point, I would hold on     antibiotics and do a CAT scan of his chest,  abdomen, and pelvis to     look for infection.  No obvious source and cultures have been     negative.  His blood pressure has remained stable, so there is no     acute indication for antibiotics with no signs of systemic     inflammatory response syndrome at this time.  We will await the     other etiologies include noninfectious cancers, medications or     rheumatologic issues.  Certainly, antibiotics themselves  can cause     persistent fevers.  Again, I will discontinue the antibiotics.     There is also no indication for Diflucan with yeast in the urine     culture not consistent with an active infection.  If he does     decompensate with systemic inflammatory response syndrome, broad-     spectrum antibiotics can be restarted.  Thank you for consultation.  We will follow along.     Gardiner Barefoot, MD     RWC/MEDQ  D:  12/05/2010  T:  12/05/2010  Job:  [id number]  Electronically Signed by Staci Righter MD on 12/14/2010 02:17:00 PM

## 2010-12-14 NOTE — Progress Notes (Signed)
  Subjective: PEG placed Heme called for SPEP and relation to efevrs?  Objective: Vital signs in last 24 hours: Temp:  [99.6 F (37.6 C)-101.4 F (38.6 C)] 99.8 F (37.7 C) (11/09 0805) Pulse Rate:  [100-109] 102  (11/09 1251) Resp:  [15-35] 21  (11/09 1251) BP: (108-129)/(52-79) 118/56 mmHg (11/09 1251) SpO2:  [97 %-100 %] 97 % (11/09 1328) FiO2 (%):  [39.5 %-40.1 %] 40 % (11/09 1553) Weight:  [163.9 kg (361 lb 5.3 oz)] 361 lb 5.3 oz (163.9 kg) (11/09 0016)  Intake/Output from previous day: 11/08 0701 - 11/09 0700 In: 1063.3 [I.V.:963.3; IV Piggyback:100] Out: 1450 [Urine:1450]  Intake/Output this shift: Total I/O In: -  Out: 1950 [Urine:1950]   Physical Exam: General:  Obese male in no acute distress  HEENT:  Trach clean LUNGS:  Coarse , trach wnl HEART:  RRR, no murmurs/rubs/gallops ABD:  Obesely distended, (+)BS, soft, nontender, no guarding EXTR:  Intact, no cyanosis, (+1) edema bilatrally  Lab Results:  [facility] 12/13/10 0655  WBC 14.2*  HGB 8.1*  HCT 24.6*  PLT 257   BMET  Basename 12/14/10 0650 12/13/10 0655 12/12/10 0520  NA 144 145 140  K 3.1* 3.3* 4.6  CL 98 103 100  CO2 33* 31 28  GLUCOSE 106* 104* 106*  BUN 9 9 11   CREATININE 1.00 0.98 0.97  CALCIUM 9.4 9.1 8.9    Studies/Results: 12/11/10 FEES ++ dysphagia  Portable Chest 1 View:  1. Findings suggestive of worsening pulmonary edema and atelectasis 2. Malpositioned right upper extremity PICC line   Major Event: Right arm PICC lines became dysfunction - flip to IJ Left arm PICC line placed 12/13/10 >>>  Assessment/Plan:  1.  Acute and chronic respiratory failure  Assessment: finally weaning with PS 16 , had required 25 above peep Goal 6-8 hrs May be able to re eval in line PMV latter or cuff drop in future Them main issue improving weaning is lasix to neg balance, maintain his  2.  Obesity hypoventilation syndrome (12/09/2010)  Assessment: See above   Plan: see above I spoke to  speech, they will re eval Monday with hopes for PS lower  3.  Hypernatremia (12/09/2010) Assessment:  Ressolved Plan:  Will follows BMET in am  Continue lasix current dose Check mg,phos in am  Give k supp iv until PEG able to use  4.  Fever (12/09/2010) Assessment: this has been an ongoing issue.  ID following, and we have decided to follow off abx.   Plan: SPEP positive, heme called,..malignanc?  5.  Sacral Wound Assessment:  Follows by ID Plan:  Wound care, heme eval  6.  Feeding Assessment: s/p PEG, appreciate IR   [name] Prichett J. 12/14/2010, 4:36 PM  Please note that we are having an objective swallow testing done today with cuff up on vent to ensure safety for swallowing Continue PS 18 attempts

## 2010-12-14 NOTE — Progress Notes (Signed)
PT/OT/SLP Cancellation Note    Treatment cancelled today due to going for PEG

## 2010-12-14 NOTE — Consult Note (Deleted)
Reason for consult: Abnormal SPEP in patient with Fever of Unknown Origin Consulting MD: Dr. Dalene Carrow Date of Consultation: 12/14/2010  HPI: The patient is a 45 year old morbidly obese, resident of [facility] with a history of chronic diastolic CHF, severe sleep apnea with obesity hypoventilation syndrome on BiPAP outpatient,, polysubstance and  tobacco abuse, hypertension, dyslipidemia, osteoarthritis, and deconditioning who presented back in 11/16/2010 with exacerbation of CHF as well as scrotal hydrocele and cellulitis. Status complicated by MRSA and VDRF requiring intubation. Pt is currently in ICU , and has undergone a permanent trach placement and PEG tube today for nutrition. Pt has extended, daily FUO followed by ID.CXR 11/08 worsening pulmonary edema and ATX..  . Admission H/H 13.4/37/8, WBC 19.6 with ANC 13. Plts  257. Sed rate 66. RA less than 10. ANA positive. Patient continues to be febrile, reaching temps up to 101.4 F for which we were asked to see to determine if the etiology of these fevers are related to  M spike of 0.4 drawn on  12/07/2010 per SPEP. No IFE ordered at the time. alpha 1 globulin 7.4; alpha 2 globulin 17.2. A new SPEP with IFE pending (drawn on 11/8). HIV negative. Today's CBC shows H/H 8.1/24.6 WBC 14.2 ANC 10. Mono 1.5 and 2.4 lymphs. Bloos Cx neg. Sodium nl 144.  Of note, pt developed large buttock pressure ulcers for which a new CT pelvis is pending to evaluate infected region affecting blood counts, and accounting for this fever.     PMH: Past Medical History  Diagnosis Date  . Hypertension   . CHF (congestive heart failure)   . Ventilator dependent     Surgeries: No past surgical history on file.  Allergies:  Allergies  Allergen Reactions  . Tramadol Swelling    Medications:  Prior to Admission:  Prescriptions prior to admission  Medication Sig Dispense Refill  . acetaminophen (TYLENOL) 500 MG tablet Take 1,000 mg by mouth 3 (three)  times daily as needed. For pain        . albuterol (PROVENTIL) (2.5 MG/3ML) 0.083% nebulizer solution Take 2.5 mg by nebulization every 3 (three) hours as needed.        Marland Kitchen amLODipine (NORVASC) 5 MG tablet Take 5 mg by mouth every morning.        . ciprofloxacin (CIPRO) 500 MG tablet Take 500 mg by mouth 2 (two) times daily.        Marland Kitchen docusate sodium (COLACE) 100 MG capsule Take 100 mg by mouth 2 (two) times daily.        . fluticasone (FLONASE) 50 MCG/ACT nasal spray Place 1 spray into the nose daily.        Marland Kitchen guaiFENesin (MUCINEX) 600 MG 12 hr tablet Take 1,200 mg by mouth 2 (two) times daily.        Marland Kitchen HYDROcodone-acetaminophen (VICODIN) 5-500 MG per tablet Take 1 tablet by mouth every 4 (four) hours as needed. For pain       . hydroxypropyl methylcellulose (ISOPTO TEARS) 2.5 % ophthalmic solution Apply 1 drop to eye every 4 (four) hours as needed. For eyes       . loratadine (CLARITIN) 10 MG tablet Take 10 mg by mouth daily.        Marland Kitchen losartan (COZAAR) 50 MG tablet Take 50 mg by mouth daily.        . magnesium hydroxide (MILK OF MAGNESIA) 400 MG/5ML suspension Take 30 mLs by mouth daily as needed. For constipation       .  potassium chloride SA (K-DUR,KLOR-CON) 20 MEQ tablet Take 20 mEq by mouth 2 (two) times daily.        . simvastatin (ZOCOR) 20 MG tablet Take 20 mg by mouth at bedtime.        Marland Kitchen tiotropium (SPIRIVA) 18 MCG inhalation capsule Place 18 mcg into inhaler and inhale every morning.        . torsemide (DEMADEX) 20 MG tablet Take 60 mg by mouth 2 (two) times daily.         Scheduled:   . antiseptic oral rinse  15 mL Mouth Rinse QID  . ceFAZolin      . ceFAZolin (ANCEF) IV  1 g Intravenous On Call  . ceFAZolin (ANCEF) IV  1 g Intravenous Q8H  . chlorhexidine  15 mL Mouth/Throat BID  . collagenase   Topical Daily  . feeding supplement  30 mL Oral 6 X Daily  . furosemide  40 mg Intravenous Q12H  . heparin subcutaneous  5,000 Units Subcutaneous Q8H  . insulin aspart  0-20 Units  Subcutaneous Q4H  . insulin glargine  15 Units Subcutaneous QHS  . metoprolol  2.5 mg Intravenous Q8H  . nystatin  15 mL Oral QID  . pantoprazole (PROTONIX) IV  40 mg Intravenous Q24H  . potassium chloride  10 mEq Intravenous Q1 Hr x 3  . sodium chloride  10 mL Intravenous Q12H  . sodium chloride  10 mL Intracatheter Q12H  . white petrolatum      . DISCONTD: ceFAZolin (ANCEF) IV  1 g Intravenous Once  . DISCONTD: heparin subcutaneous  5,000 Units Subcutaneous Q8H  . DISCONTD: sodium chloride  10 mL Intracatheter Q12H   Continuous:   . dextrose 5 % and 0.9% NaCl 50 mL/hr at 12/13/10 1646   UEA:VWUJWJXBJYNWG (TYLENOL) oral liquid 160 mg/5 mL, acetaminophen, albuterol, bisacodyl, fentaNYL, fentaNYL, hydroxypropyl methylcellulose, iohexol, midazolam, midazolam, ondansetron, sodium chloride, DISCONTD: sodium chloride  NFA:OZHYQM of Systems  Constitutional: Positive for weight loss.Positive  for fever, chills and malaise/fatigue.  Eyes: Negative for blurred vision and double vision.  Respiratory: Negative for cough, hemoptysis and shortness of breath.  Cardiovascular: Positive for chest pain. GI:  Dysphagia requiring swallow evaluation.No nausea, vomiting, diarrhea, constipation. No change in bowel caliber. No  Melena or  Hematochezia.  GU: No blood in urine. No loss of urinary control. Skin: Negative for itching. No rash. No petechia. No bruising Neurological: No headaches. No motor or sensory deficits.  Family History: No family history on file.  Social History:  does not have a smoking history on file. He does not have any smokeless tobacco history on file. His alcohol and drug histories not on file.  Physical Exam  45 year old  in no acute distress  General well-developed and well-nourished  Neurologic A. and O. x3, no focal deficits  HEENT: Normocephalic, atraumatic, PERRLA. Oral cavity without thrush or lesions. Neck tracheotomy in place with some secretions. voice coarse.  no  thyromegaly, no cervical or supraclavicular adenopathy  Lungs clear bilaterally anteriorily, distant sounds. No wheezing, rhonchi or rales. No axillary masses. Breasts: not examined. Cardiac regular rate and rhythm normal S1-S2, no murmur , rubs or gallops Abdomen obese,  nontender , bowel sounds x4 GU/rectal: deferred. Extremities no clubbing cyanosis , 1+edema. Skin: known large pressure buttock ulcer of about 13 cm, as per WOC    Labs: Results for orders placed during the hospital encounter of 11/16/10 (from the past 72 hour(s))  GLUCOSE, CAPILLARY     Status: Abnormal  Collection Time   12/11/10  4:25 PM      Component Value Range Comment   Glucose-Capillary 112 (*) 70 - 99 (mg/dL)   GLUCOSE, CAPILLARY     Status: Abnormal   Collection Time   12/11/10  8:35 PM      Component Value Range Comment   Glucose-Capillary 116 (*) 70 - 99 (mg/dL)    Comment 1 Documented in Chart      Comment 2 Notify RN     GLUCOSE, CAPILLARY     Status: Abnormal   Collection Time   12/12/10 12:12 AM      Component Value Range Comment   Glucose-Capillary 123 (*) 70 - 99 (mg/dL)    Comment 1 Documented in Chart      Comment 2 Notify RN     GLUCOSE, CAPILLARY     Status: Abnormal   Collection Time   12/12/10  4:30 AM      Component Value Range Comment   Glucose-Capillary 111 (*) 70 - 99 (mg/dL)    Comment 1 Documented in Chart      Comment 2 Notify RN     BASIC METABOLIC PANEL     Status: Abnormal   Collection Time   12/12/10  5:20 AM      Component Value Range Comment   Sodium 140  135 - 145 (mEq/L)    Potassium 4.6  3.5 - 5.1 (mEq/L) HEMOLYSIS AT THIS LEVEL MAY AFFECT RESULT   Chloride 100  96 - 112 (mEq/L)    CO2 28  19 - 32 (mEq/L)    Glucose, Bld 106 (*) 70 - 99 (mg/dL)    BUN 11  6 - 23 (mg/dL)    Creatinine, Ser 0.98  0.50 - 1.35 (mg/dL)    Calcium 8.9  8.4 - 10.5 (mg/dL)    GFR calc non Af Amer >90  >90 (mL/min)    GFR calc Af Amer >90  >90 (mL/min)   GLUCOSE, CAPILLARY     Status:  Abnormal   Collection Time   12/12/10  7:31 AM      Component Value Range Comment   Glucose-Capillary 110 (*) 70 - 99 (mg/dL)    Comment 1 Notify RN     GLUCOSE, CAPILLARY     Status: Normal   Collection Time   12/12/10 11:41 AM      Component Value Range Comment   Glucose-Capillary 95  70 - 99 (mg/dL)    Comment 1 Notify RN     GLUCOSE, CAPILLARY     Status: Abnormal   Collection Time   12/12/10  4:44 PM      Component Value Range Comment   Glucose-Capillary 102 (*) 70 - 99 (mg/dL)    Comment 1 Notify RN     GLUCOSE, CAPILLARY     Status: Abnormal   Collection Time   12/12/10  8:54 PM      Component Value Range Comment   Glucose-Capillary 109 (*) 70 - 99 (mg/dL)    Comment 1 Notify RN      Comment 2 Documented in Chart     GLUCOSE, CAPILLARY     Status: Abnormal   Collection Time   12/13/10  1:20 AM      Component Value Range Comment   Glucose-Capillary 114 (*) 70 - 99 (mg/dL)   GLUCOSE, CAPILLARY     Status: Abnormal   Collection Time   12/13/10  4:02 AM      Component Value Range  Comment   Glucose-Capillary 125 (*) 70 - 99 (mg/dL)   URINALYSIS, ROUTINE W REFLEX MICROSCOPIC     Status: Abnormal   Collection Time   12/13/10  4:30 AM      Component Value Range Comment   Color, Urine AMBER (*) YELLOW  BIOCHEMICALS MAY BE AFFECTED BY COLOR   Appearance CLOUDY (*) CLEAR     Specific Gravity, Urine 1.017  1.005 - 1.030     pH 6.0  5.0 - 8.0     Glucose, UA NEGATIVE  NEGATIVE (mg/dL)    Hgb urine dipstick TRACE (*) NEGATIVE     Bilirubin Urine SMALL (*) NEGATIVE     Ketones, ur 40 (*) NEGATIVE (mg/dL)    Protein, ur 30 (*) NEGATIVE (mg/dL)    Urobilinogen, UA 2.0 (*) 0.0 - 1.0 (mg/dL)    Nitrite POSITIVE (*) NEGATIVE     Leukocytes, UA TRACE (*) NEGATIVE    URINE MICROSCOPIC-ADD ON     Status: Abnormal   Collection Time   12/13/10  4:30 AM      Component Value Range Comment   Squamous Epithelial / LPF FEW (*) RARE     WBC, UA 3-6  <3 (WBC/hpf)    RBC / HPF 3-6  <3  (RBC/hpf)    Bacteria, UA MANY (*) RARE    BASIC METABOLIC PANEL     Status: Abnormal   Collection Time   12/13/10  6:55 AM      Component Value Range Comment   Sodium 145  135 - 145 (mEq/L)    Potassium 3.3 (*) 3.5 - 5.1 (mEq/L) DELTA CHECK NOTED   Chloride 103  96 - 112 (mEq/L)    CO2 31  19 - 32 (mEq/L)    Glucose, Bld 104 (*) 70 - 99 (mg/dL)    BUN 9  6 - 23 (mg/dL)    Creatinine, Ser 0.45  0.50 - 1.35 (mg/dL)    Calcium 9.1  8.4 - 10.5 (mg/dL)    GFR calc non Af Amer >90  >90 (mL/min)    GFR calc Af Amer >90  >90 (mL/min)   CBC     Status: Abnormal   Collection Time   12/13/10  6:55 AM      Component Value Range Comment   WBC 14.2 (*) 4.0 - 10.5 (K/uL)    RBC 3.30 (*) 4.22 - 5.81 (MIL/uL)    Hemoglobin 8.1 (*) 13.0 - 17.0 (g/dL)    HCT 40.9 (*) 81.1 - 52.0 (%)    MCV 74.5 (*) 78.0 - 100.0 (fL)    MCH 24.5 (*) 26.0 - 34.0 (pg)    MCHC 32.9  30.0 - 36.0 (g/dL)    RDW 91.4 (*) 78.2 - 15.5 (%)    Platelets 257  150 - 400 (K/uL)   DIFFERENTIAL     Status: Abnormal   Collection Time   12/13/10  6:55 AM      Component Value Range Comment   Neutrophils Relative 70  43 - 77 (%)    Neutro Abs 10.0 (*) 1.7 - 7.7 (K/uL)    Lymphocytes Relative 17  12 - 46 (%)    Lymphs Abs 2.4  0.7 - 4.0 (K/uL)    Monocytes Relative 11  3 - 12 (%)    Monocytes Absolute 1.5 (*) 0.1 - 1.0 (K/uL)    Eosinophils Relative 2  0 - 5 (%)    Eosinophils Absolute 0.3  0.0 - 0.7 (K/uL)    Basophils  Relative 0  0 - 1 (%)    Basophils Absolute 0.0  0.0 - 0.1 (K/uL)   GLUCOSE, CAPILLARY     Status: Abnormal   Collection Time   12/13/10  8:14 AM      Component Value Range Comment   Glucose-Capillary 114 (*) 70 - 99 (mg/dL)    Comment 1 Documented in Chart      Comment 2 Notify RN     GLUCOSE, CAPILLARY     Status: Normal   Collection Time   12/13/10 11:57 AM      Component Value Range Comment   Glucose-Capillary 95  70 - 99 (mg/dL)    Comment 1 Documented in Chart      Comment 2 Notify RN     GLUCOSE,  CAPILLARY     Status: Abnormal   Collection Time   12/13/10  3:58 PM      Component Value Range Comment   Glucose-Capillary 100 (*) 70 - 99 (mg/dL)   GLUCOSE, CAPILLARY     Status: Normal   Collection Time   12/13/10  7:44 PM      Component Value Range Comment   Glucose-Capillary 95  70 - 99 (mg/dL)    Comment 1 Documented in Chart      Comment 2 Notify RN     GLUCOSE, CAPILLARY     Status: Normal   Collection Time   12/14/10 12:11 AM      Component Value Range Comment   Glucose-Capillary 96  70 - 99 (mg/dL)    Comment 1 Documented in Chart      Comment 2 Notify RN     GLUCOSE, CAPILLARY     Status: Abnormal   Collection Time   12/14/10  4:09 AM      Component Value Range Comment   Glucose-Capillary 106 (*) 70 - 99 (mg/dL)    Comment 1 Documented in Chart      Comment 2 Notify RN     BASIC METABOLIC PANEL     Status: Abnormal   Collection Time   12/14/10  6:50 AM      Component Value Range Comment   Sodium 144  135 - 145 (mEq/L)    Potassium 3.1 (*) 3.5 - 5.1 (mEq/L)    Chloride 98  96 - 112 (mEq/L)    CO2 33 (*) 19 - 32 (mEq/L)    Glucose, Bld 106 (*) 70 - 99 (mg/dL)    BUN 9  6 - 23 (mg/dL)    Creatinine, Ser 1.61  0.50 - 1.35 (mg/dL)    Calcium 9.4  8.4 - 10.5 (mg/dL)    GFR calc non Af Amer 89 (*) >90 (mL/min)    GFR calc Af Amer >90  >90 (mL/min)   GLUCOSE, CAPILLARY     Status: Abnormal   Collection Time   12/14/10  8:01 AM      Component Value Range Comment   Glucose-Capillary 109 (*) 70 - 99 (mg/dL)    Dg Chest Portable 1 View  12/13/2010  *RADIOLOGY REPORT*  Clinical Data: Evaluate tracheostomy tube positioning  PORTABLE CHEST - 1 VIEW  Comparison: 12/10/2010; 12/05/2010; chest CT - 12/06/2010  Findings:  Examination minimally degraded by patient body habitus, portable technique and exclusion of the bilateral hemidiaphragms.  Grossly unchanged enlarged cardiac silhouette and mediastinal contours. Tracheostomy tube overlies tracheal air column.  Right upper extremity  approach PICC line is malpositioned, coiled likely within the central aspect the right internal jugular vein.  The pulmonary vasculature remains indistinct, worsened in the interval.  Grossly unchanged perihilar and bibasilar opacities.  No definite pneumothorax. Evaluation for pleural fluid effusion is limited secondary exclusion of the bilateral hemidiaphragms.  Grossly unchanged bones.  IMPRESSION: 1.  Findings suggestive of worsening pulmonary edema and atelectasis. 2.  Malpositioned right upper extremity approach PICC line, likely coiled within the right internal jugular vein.  Above findings discussed with Gershon Mussel, RN at [phone].  Original Report Authenticated By: Waynard Reeds, M.D.    A/P: 45 y.o. male asked to see for evaluation of abnormal SPEP, with Mspike of 0.4 in a patient with multiple medical problems including FUO, and leukocytosis.  Dr. Dalene Carrow is to see the patient following this consult with recommendations regarding diagnosis and further workup studies.  Thank you for the referral. An addendum to be dictated to this consult note with plans.   [facility] E 12/14/2010 1:11 PM

## 2010-12-14 NOTE — Progress Notes (Signed)
received request for DME tube feeding ORDER CORRECTED-Kalise Fickett

## 2010-12-14 NOTE — Progress Notes (Signed)
Pt angry when advised he could not have wet washcloths to suck the water out of due to procedure pending today. Explained to patient that he could not have anything in his stomach for the procedure as it could cause complications with his intervention. Pt given damp washcloth  To use for face, became angry that it was not wet enough to get water out to drink, threw washcloth at nurse and attempted to throw call bell at nurse.

## 2010-12-14 NOTE — Procedures (Signed)
Post-Procedure Note  Pre-operative Diagnosis: Multiple medical problems including failed swallow exam.       Post-operative Diagnosis: Failed swallow exam   Indications: Failed swallow exam  Procedure Details:   Successful placement of 20 French G-tube.     Findings:G-tube in stomach  Complications: None     Condition:stable  Plan:Start feeds on 12/15/10.

## 2010-12-14 NOTE — Progress Notes (Signed)
Speech Language Pathology  Cuff deflation on PRVC and PMSV trials in-line have been deferred due to both patient refusal as well as secretion-based issues this week.  Consulted with Dr. Tyson Alias regarding this issue and await his direction whether to proceed with cuff deflation (in conjunction with RT) next week.  Myra Rude, M.S.,CCC-SLP Pager [phone]

## 2010-12-14 NOTE — Progress Notes (Signed)
Care Management office received request for DME tube feeding. Confirmed this is not for home use and that CM will not be obtaining for inpatient use. Nurse on unit made aware and will contact nutrition to determine how to obtain for inpatient use. Genella Rife Weeks 12/14/2010 225pm

## 2010-12-15 ENCOUNTER — Inpatient Hospital Stay (HOSPITAL_COMMUNITY): Payer: Medicare Other

## 2010-12-15 LAB — FERRITIN: Ferritin: 515 ng/mL — ABNORMAL HIGH (ref 22–322)

## 2010-12-15 LAB — BASIC METABOLIC PANEL
CO2: 35 mEq/L — ABNORMAL HIGH (ref 19–32)
Calcium: 9.3 mg/dL (ref 8.4–10.5)
GFR calc non Af Amer: 80 mL/min — ABNORMAL LOW (ref >90)
Glucose, Bld: 104 mg/dL — ABNORMAL HIGH (ref 70–99)
Potassium: 3 mEq/L — ABNORMAL LOW (ref 3.5–5.1)
Sodium: 144 mEq/L (ref 135–145)

## 2010-12-15 LAB — GLUCOSE, CAPILLARY: Glucose-Capillary: 103 mg/dL — ABNORMAL HIGH (ref 70–99)

## 2010-12-15 LAB — LACTATE DEHYDROGENASE: LDH: 319 U/L — ABNORMAL HIGH (ref 94–250)

## 2010-12-15 LAB — VITAMIN B12: Vitamin B-12: 557 pg/mL (ref 211–911)

## 2010-12-15 LAB — MAGNESIUM: Magnesium: 1.5 mg/dL (ref 1.5–2.5)

## 2010-12-15 LAB — RETICULOCYTES
Retic Count, Absolute: 63.5 10*3/uL (ref 19.0–186.0)
Retic Ct Pct: 1.8 % (ref 0.4–3.1)

## 2010-12-15 LAB — PHOSPHORUS: Phosphorus: 3.7 mg/dL (ref 2.3–4.6)

## 2010-12-15 MED ORDER — POTASSIUM CHLORIDE 10 MEQ/50ML IV SOLN
INTRAVENOUS | Status: AC
  Filled 2010-12-15: qty 50

## 2010-12-15 MED ORDER — FENTANYL CITRATE 0.05 MG/ML IJ SOLN
100.0000 ug | INTRAMUSCULAR | Status: DC | PRN
  Administered 2010-12-15 – 2010-12-17 (×20): 100 ug via INTRAVENOUS
  Filled 2010-12-15 (×16): qty 2

## 2010-12-15 MED ORDER — POTASSIUM CHLORIDE 10 MEQ/100ML IV SOLN
10.0000 meq | INTRAVENOUS | Status: AC
  Administered 2010-12-15 (×4): 10 meq via INTRAVENOUS

## 2010-12-15 MED ORDER — OSMOLITE 1.2 CAL PO LIQD
1000.0000 mL | ORAL | Status: DC
  Administered 2010-12-15: 1000 mL
  Filled 2010-12-15: qty 1000

## 2010-12-15 NOTE — Progress Notes (Signed)
Trach site checked and found to be red with skin breakdown and drainage. Trach care performed and site dressed with Restore. Janina Mayo ties changed at this time as well. RN notified of wounds. RT will monitor.

## 2010-12-15 NOTE — Progress Notes (Signed)
Subjective: Nurse in room. Patient agitated and indicating not enough air, just after being turned for wound care. Nurse gave fentanyl 25 mg as ordered, and felt he should be getting 100 mg- asking every 2 hours now. No other acute concerns expressed.   Objective: Vital signs in last 24 hours: Temp:  [98 F (36.7 C)-101 F (38.3 C)] 101 F (38.3 C) (11/10 0857) Pulse Rate:  [100-119] 115  (11/10 0900) Resp:  [15-38] 38  (11/10 0900) BP: (99-137)/(53-79) 126/58 mmHg (11/10 0900) SpO2:  [97 %-100 %] 98 % (11/10 0900) FiO2 (%):  [40 %] 40 % (11/10 0900) Weight:  [65.8 kg (145 lb 1 oz)-170.1 kg (375 lb)] 375 lb (170.1 kg) (11/10 0038)  Morbidly obese man, nonverbal but responsive to voice.Initially agitated, calmed quickly with fentanyl. Tracheostomy appears intact- nurse suctioned scant light yellow mucus. Cor- sinus tachy 140, no murmur Bilateral breath sounds, muffled c/w body habitus but without wheeze or rub. Abd- obese. PEG appears intact.  Extremities- heavy, w/o edema.   Lab Results:  [facility] 12/13/10 0655  WBC 14.2*  HGB 8.1*  HCT 24.6*  PLT 257   BMET  Basename 12/15/10 0459 12/14/10 0650  NA 144 144  K 3.0* 3.1*  CL 96 98  CO2 35* 33*  GLUCOSE 104* 106*  BUN 11 9  CREATININE 1.09 1.00  CALCIUM 9.3 9.4    Studies/Results: Ir Gastrostomy Tube  12/14/2010  *RADIOLOGY REPORT*  Clinical history:45 year old with malnutrition and failed swallowing examination.  PROCEDURE(S): PERCUTANEOUS GASTROSTOMY TUBE WITH FLUOROSCOPIC GUIDANCE  Physician: Rachelle Hora. Lowella Dandy, MD  Medications:Versed 4.00mg , Fentanyl 150 mcg  Moderate sedation time:30 minutes  Fluoroscopy time: 10.5 minutes minutes  Procedure:Informed consent was obtained for a percutaneous gastrostomy tube.  The patient was placed on the interventional table.  The patient refused a nasogastric tube for barium administration the night before the procedure.  Attempted to place a rectal tube but due to the patient's body  habitus, the tube could not be successfully placed.  Fluoroscopy demonstrated gas within the transverse colon and this was felt to be adequate for the procedure.  An orogastric tube was placed with fluoroscopic guidance.  The anterior abdomen was prepped and draped in sterile fashion.  Maximal barrier sterile technique was utilized including caps, mask, sterile gowns, sterile gloves, sterile drape, hand hygiene and skin antiseptic.  Stomach was inflated with air through the orogastric tube.  The skin and subcutaneous tissues were anesthetized with 1% lidocaine.  A 17 gauge needle was directed into the distended stomach with fluoroscopic guidance.  A wire was advanced into the stomach and a T-tact was deployed.  A 9-French vascular sheath was placed and the orogastric tube was snared using a Gooseneck snare device.  The orogastric tube and snare were pulled out of the patient's mouth.  The snare device was connected to a 20-French gastrostomy tube.  The snare device and gastrostomy tube were pulled through the patient's mouth and out the anterior abdominal wall.  The gastrostomy tube was cut to an appropriate length.  Contrast injection through gastrostomy tube confirmed placement within the stomach.  Fluoroscopic images were obtained for documentation.  The gastrostomy tube was flushed with normal saline.  Findings:Gastrostomy tube within the stomach.  Complications: None  Impression:Successful fluoroscopic guided percutaneous gastrostomy tube placement.  Original Report Authenticated By: Richarda Overlie, M.D.    Medications: I have reviewed the patient's current medications.  Assessment/Plan: 1) Acute and Chronic respiratory failure. He will not wean with repeated agitation. I  will follow nurse's request and increase fentanyl. RT evaluating.   2) Nutrition- discussed w/ Dr Tyson Alias. Will restart TF gradually, using dietician recommendations from 11/9. Monitor lytes, Mg, Phos.  LOS: 29 days   [name],[name]  D 12/15/2010, 11:07 AM

## 2010-12-16 DIAGNOSIS — R5383 Other fatigue: Secondary | ICD-10-CM

## 2010-12-16 DIAGNOSIS — R5381 Other malaise: Secondary | ICD-10-CM

## 2010-12-16 DIAGNOSIS — D472 Monoclonal gammopathy: Secondary | ICD-10-CM

## 2010-12-16 DIAGNOSIS — A419 Sepsis, unspecified organism: Secondary | ICD-10-CM

## 2010-12-16 LAB — MAGNESIUM: Magnesium: 1.4 mg/dL — ABNORMAL LOW (ref 1.5–2.5)

## 2010-12-16 LAB — BASIC METABOLIC PANEL
Calcium: 8.9 mg/dL (ref 8.4–10.5)
Creatinine, Ser: 1.01 mg/dL (ref 0.50–1.35)
GFR calc Af Amer: >90 mL/min (ref >90)
GFR calc non Af Amer: 88 mL/min — ABNORMAL LOW (ref >90)
Sodium: 142 mEq/L (ref 135–145)

## 2010-12-16 LAB — GLUCOSE, CAPILLARY
Glucose-Capillary: 123 mg/dL — ABNORMAL HIGH (ref 70–99)
Glucose-Capillary: 136 mg/dL — ABNORMAL HIGH (ref 70–99)
Glucose-Capillary: 128 mg/dL — ABNORMAL HIGH (ref 70–99)
Glucose-Capillary: 112 mg/dL — ABNORMAL HIGH (ref 70–99)
Glucose-Capillary: 131 mg/dL — ABNORMAL HIGH (ref 70–99)

## 2010-12-16 MED ORDER — POTASSIUM CHLORIDE 20 MEQ/15ML (10%) PO LIQD
40.0000 meq | Freq: Once | ORAL | Status: AC
  Administered 2010-12-16: 40 meq
  Filled 2010-12-16 (×2): qty 30

## 2010-12-16 MED ORDER — OSMOLITE 1.2 CAL PO LIQD
1000.0000 mL | ORAL | Status: DC
  Administered 2010-12-16 – 2010-12-17 (×2): 1000 mL
  Filled 2010-12-16 (×3): qty 1000

## 2010-12-16 NOTE — Progress Notes (Signed)
Oncology Progress note  Subjective:  Pt remains non verbal.  MEDICATIONS:  Scheduled:   . antiseptic oral rinse  15 mL Mouth Rinse QID  . ceFAZolin (ANCEF) IV  1 g Intravenous Q8H  . chlorhexidine  15 mL Mouth/Throat BID  . collagenase   Topical Daily  . feeding supplement  30 mL Oral 6 X Daily  . heparin subcutaneous  5,000 Units Subcutaneous Q8H  . insulin aspart  0-20 Units Subcutaneous Q4H  . insulin glargine  15 Units Subcutaneous QHS  . metoprolol  2.5 mg Intravenous Q8H  . nystatin  15 mL Oral QID  . pantoprazole (PROTONIX) IV  40 mg Intravenous Q24H  . potassium chloride  40 mEq Per Tube Once  . sodium chloride  10 mL Intravenous Q12H  . sodium chloride  10 mL Intracatheter Q12H   Continuous:   . dextrose 5 % and 0.9% NaCl 50 mL/hr at 12/15/10 2037  . feeding supplement (OSMOLITE 1.2 CAL) 1,000 mL (12/15/10 1327)   EXB:[id number] (TYLENOL) oral liquid 160 mg/5 mL, acetaminophen, albuterol, bisacodyl, fentaNYL, hydroxypropyl methylcellulose, midazolam, ondansetron, sodium chloride   OBJECTIVE:  PHYSICAL EXAM  Vital signs in last 24 hours: Temp:  [98.5 F (36.9 C)-101.1 F (38.4 C)] 100.3 F (37.9 C) (11/11 1248) Pulse Rate:  [106-117] 110  (11/11 1200) Resp:  [16-39] 29  (11/11 1200) BP: (101-118)/(48-61) 112/54 mmHg (11/11 1200) SpO2:  [97 %-100 %] 99 % (11/11 1200) FiO2 (%):  [39.8 %-40.7 %] 39.9 % (11/11 1200) Weight:  [383 lb 9.6 oz (174 kg)] 383 lb 9.6 oz (174 kg) (11/11 0427)  Intake/Output from previous day: 11/10 0701 - 11/11 0700 In: 1900 [I.V.:1150; NG/GT:460; IV Piggyback:100] Out: 1400 [Urine:1400] Intake/Output this shift: Total I/O In: 300 [I.V.:250; Other:50] Out: 0   Patient's morbidly obese. Tracheostomy noted. Chest scattered rails in both bases bilaterally. CVS for second heart sounds present. Abdomen obese soft and bowel sounds present. Extremities ++ edema.   REVIEW UPDATED LABS:  CBC    Component Value Date/Time   WBC 14.2*  12/13/2010 0655   RBC 3.30* 12/13/2010 0655   HGB 8.1* 12/13/2010 0655   HCT 24.6* 12/13/2010 0655   PLT 257 12/13/2010 0655   MCV 74.5* 12/13/2010 0655   MCH 24.5* 12/13/2010 0655   MCHC 32.9 12/13/2010 0655   RDW 16.6* 12/13/2010 0655   LYMPHSABS 2.4 12/13/2010 0655   MONOABS 1.5* 12/13/2010 0655   EOSABS 0.3 12/13/2010 0655   BASOSABS 0.0 12/13/2010 0655     Basename 12/16/10 0543 12/15/10 0459  NA 142 144  K 3.1* 3.0*  CL 97 96  CO2 37* 35*  GLUCOSE 135* 104*  BUN 15 11  CREATININE 1.01 1.09  CALCIUM 8.9 9.3   Ferritin 515, iron 12, TIBC 191, LDH 319  XRAYS/RESULTS: Skeletal survey with no evidence of metastatic disease to suggest multiple myeloma.      ASSESSMENT and PLAN: 1. IgA MoInoclonal gammopathy of undetermined significance with no evidence of end organ damage such as hypercalcemia, renal failure, and no evidence of osseous metastases. The abnormal SPEP is not aetiology of patient's fevers. It's likely source of this may be patient's grade 3 decubitus ulcers. 2. Anemia of chronic disease. 3. Connective tissue disease with positive ANA with speckled pattern.  ?rheumatology referral.   If there are any further questions or concerns please do not hesitate to contact us.     Arlan Organ I., MD 12/16/2010

## 2010-12-16 NOTE — Progress Notes (Signed)
  Subjective: 45 yoM with Acute and Chronic Resp failure/ vent/ trach, OHS, decubiti, monoclonal gammopathy, fever.  Week end cover- He has been much calmer with fentanyl increased yesterday to 100  Nurse notes not voided - condom cath. He denies bladder discomfort and is too big to palpate. Tube feed yesterday started at 10 ml/ hr with no problems. He asks for water- vocalizing a little around cuff. Had failed swallowing eval.  Objective: Vital signs in last 24 hours: Temp:  [98.5 F (36.9 C)-101.1 F (38.4 C)] 100.3 F (37.9 C) (11/11 1248) Pulse Rate:  [106-117] 110  (11/11 1200) Resp:  [16-39] 29  (11/11 1200) BP: (101-118)/(48-61) 112/54 mmHg (11/11 1200) SpO2:  [97 %-100 %] 99 % (11/11 1200) FiO2 (%):  [39.8 %-40.7 %] 39.9 % (11/11 1200) Weight:  [174 kg (383 lb 9.6 oz)] 383 lb 9.6 oz (174 kg) (11/11 0427)  Exam- morbidly obese. Alert, calm and responsive. Temp 100.3 Skin- no rash Nodes- none found Neuro- moving all extremities purposefully Tracheostomy- he drags it with his arm, but positioning, wound and function seem good. Lungs, quiet, clear Cor- RRR, no murmur Abd- massively obese- faint bowel sounds. PEG. Foley to condom Ext- heavy legs, no edema   Lab Results: No results found for this basename: WBC:2,HGB:2,HCT:2,PLT:2 in the last 72 hours BMET  [facility] 12/16/10 0543 12/15/10 0459  NA 142 144  K 3.1* 3.0*  CL 97 96  CO2 37* 35*  GLUCOSE 135* 104*  BUN 15 11  CREATININE 1.01 1.09  CALCIUM 8.9 9.3    Studies/Results: Dg Bone Survey Met  12/15/2010  *RADIOLOGY REPORT*  Clinical Data: Metastatic bone survey.  METASTATIC BONE SURVEY  Comparison: CT chest 12/06/2010.  Findings: The tracheostomy tube is in good position.  The left PICC line is in good position.  The heart is enlarged and there are bronchitic lung changes.  No lytic or sclerotic bone lesions are identified in the axial or appendicular skeleton.  There are moderate degenerative changes  involving both shoulders.  No skull lesions are seen.  IMPRESSION: Negative metastatic bone survey for metastatic bone disease.  Original Report Authenticated By: P. Loralie Champagne, M.D.    Medications: I have reviewed the patient's current medications.  Assessment/Plan: 1) Respiratory failure- Continues vent/ trach, requiring sedation periodically.  2)Nutrition- PEG functioning. Will advance TF. Note hypokalemia, should respond with increased feed.  3) Decubitus 4) Abnormal SPEP- being eval by Heme/ Onc consult 5 ? Urinary retention- if fails to void, will need US bladder for residual and may need foley Plan- increase TF to 25 ml/ hr. Watch need to adjust IV.BMET tomorrow  LOS: 30 days   Shaylee Stanislawski D 12/16/2010, 1:57 PM

## 2010-12-16 NOTE — Progress Notes (Signed)
eLink Physician-Brief Progress Note Patient Name: [name] DOB: [birth year] MRN: [id number]  Date of Service  12/16/2010   HPI/Events of Note   Hypokalemia  eICU Interventions  Potassium replaced      DETERDING,ELIZABETH 12/16/2010, 7:16 AM

## 2010-12-17 DIAGNOSIS — E662 Morbid (severe) obesity with alveolar hypoventilation: Secondary | ICD-10-CM

## 2010-12-17 DIAGNOSIS — Z9911 Dependence on respirator [ventilator] status: Secondary | ICD-10-CM

## 2010-12-17 DIAGNOSIS — J9620 Acute and chronic respiratory failure, unspecified whether with hypoxia or hypercapnia: Secondary | ICD-10-CM

## 2010-12-17 LAB — HAPTOGLOBIN: Haptoglobin: 401 mg/dL — ABNORMAL HIGH (ref 30–200)

## 2010-12-17 LAB — BASIC METABOLIC PANEL
GFR calc Af Amer: >90 mL/min (ref >90)
GFR calc non Af Amer: >90 mL/min (ref >90)
Potassium: 3.2 mEq/L — ABNORMAL LOW (ref 3.5–5.1)
Sodium: 140 mEq/L (ref 135–145)

## 2010-12-17 LAB — IMMUNOFIXATION ELECTROPHORESIS
IgG (Immunoglobin G), Serum: 1060 mg/dL (ref 650–1600)
Total Protein ELP: 6.3 g/dL (ref 6.0–8.3)

## 2010-12-17 LAB — GLUCOSE, CAPILLARY
Glucose-Capillary: 134 mg/dL — ABNORMAL HIGH (ref 70–99)
Glucose-Capillary: 144 mg/dL — ABNORMAL HIGH (ref 70–99)

## 2010-12-17 MED ORDER — METOPROLOL TARTRATE 25 MG/10 ML ORAL SUSPENSION
25.0000 mg | Freq: Two times a day (BID) | ORAL | Status: DC
  Administered 2010-12-17 – 2010-12-25 (×16): 25 mg
  Filled 2010-12-17 (×18): qty 10

## 2010-12-17 MED ORDER — MIDAZOLAM HCL 5 MG/5ML IJ SOLN
2.0000 mg | INTRAMUSCULAR | Status: DC | PRN
  Administered 2010-12-17 – 2010-12-24 (×30): 2 mg via INTRAVENOUS
  Administered 2010-12-24: 4 mg via INTRAVENOUS
  Administered 2010-12-25 (×2): 2 mg via INTRAVENOUS

## 2010-12-17 MED ORDER — FENTANYL CITRATE 0.05 MG/ML IJ SOLN
50.0000 ug | INTRAMUSCULAR | Status: DC | PRN
  Administered 2010-12-17 – 2010-12-21 (×23): 50 ug via INTRAVENOUS
  Administered 2010-12-21: 100 ug via INTRAVENOUS
  Administered 2010-12-21 (×4): 50 ug via INTRAVENOUS
  Administered 2010-12-22: 100 ug via INTRAVENOUS
  Administered 2010-12-23 (×2): 50 ug via INTRAVENOUS
  Filled 2010-12-17 (×33): qty 2

## 2010-12-17 MED ORDER — POTASSIUM CHLORIDE 20 MEQ/15ML (10%) PO LIQD
40.0000 meq | Freq: Once | ORAL | Status: AC
  Administered 2010-12-17: 40 meq
  Filled 2010-12-17: qty 30

## 2010-12-17 NOTE — Plan of Care (Signed)
Problem: Phase I Progression Outcomes Goal: Progress activity as tolerated unless otherwise ordered Outcome: Progressing Pt to side of bed with PT.  PT reported that for the past few days he has been declining PT work d/t weakness

## 2010-12-17 NOTE — Progress Notes (Signed)
UR Completed.   Roneshia Drew [name] 12/17/2010  

## 2010-12-17 NOTE — Progress Notes (Signed)
  Subjective: The pt continues to have low grade fevers.  Cognition fully intact  Objective: Vital signs in last 24 hours: Temp:  [98.1 F (36.7 C)-101.5 F (38.6 C)] 99.8 F (37.7 C) (11/12 1205) Pulse Rate:  [99-115] 101  (11/12 1300) Resp:  [19-27] 20  (11/12 1300) BP: (93-126)/(43-73) 121/61 mmHg (11/12 1300) SpO2:  [97 %-100 %] 100 % (11/12 1300) FiO2 (%):  [39.7 %-40.3 %] 39.7 % (11/12 1300) Weight:  [167 kg (368 lb 2.7 oz)] 368 lb 2.7 oz (167 kg) (11/12 0519)  Intake/Output from previous day: 11/11 0701 - 11/12 0700 In: 1865 [I.V.:1050; NG/GT:300; IV Piggyback:100] Out: 250 [Urine:250]  Intake/Output this shift: Total I/O In: 610 [P.O.:5; I.V.:310; Other:175; NG/GT:120] Out: -    Physical Exam: General:  Obese male in no acute distress but is upset because he is not allow to eat HEENT:  NCAT, EOMI, nonicteric sclera, no JVD, no LAD, no discharge LUNGS:  Coarse due to tracheotomy and vent, no wheeze HEART:  RRR, no murmurs/rubs/gallops ABD:  Obesely distended, (+)BS, soft, nontender, no guarding EXTR:  Intact, no cyanosis, (+1) edema bilatrally  Lab Results: No results found for this basename: WBC:3,HGB:3,HCT:3,PLT:3 in the last 72 hours BMET  [facility] 12/17/10 0550 12/16/10 0543 12/15/10 0459  NA 140 142 144  K 3.2* 3.1* 3.0*  CL 97 97 96  CO2 34* 37* 35*  GLUCOSE 135* 135* 104*  BUN 15 15 11   CREATININE 0.90 1.01 1.09  CALCIUM 8.8 8.9 9.3    Studies/Results: 12/11/10 FEES ++ dysphagia  Portable Chest 1 View:  1. Findings suggestive of worsening pulmonary edema and atelectasis 2. Malpositioned right upper extremity PICC line   Major Event: Right arm PICC lines became dysfunction - flip to IJ Left arm PICC line placed 12/13/10 >>>  Assessment/Plan:  1.  Acute and chronic respiratory failure  Assessment:  Not weaning well on PSV, and not making a lot of progress.  His fevers may be contributing to this.  (per ID)  Plan:  Continue with PSV wean as  tolerated, but likely will need LTAC Did better on PS today, may need neg balance further see pcxr  2.  Obesity hypoventilation syndrome (12/09/2010)  Assessment: See above   Plan: I explained likley  benfit life long trach Needs peg, planned  3.  Hypernatremia (12/09/2010) Assessment:  Ressolved Plan:  Will follows BMET as needed Lost pic, replace then replace K  4.  Fever (12/09/2010) Assessment: this has been an ongoing issue. Appears that cefazolin was started for unclear indication 11/09. Will D/C. ID following, and we have decided to follow off abx.   Plan: As above, continue to observe and follow ID plan for pelvis CT..will consider to combine with   IR procedure  5.  Sacral Wound Assessment:  Follows by ID Plan:  Will follow with ID plan for CT of pelvis for further assessment  6.  Feeding Assessment:  Feeding tube cannot be place today planned for tomorrow.  Might need NGT for barium Plan:  Will follow along with IR on progress, peg planned am  Will have extenisve d/w pt , so to undersatnd importance for tube feeds and nutrition needs Continue PS attempts, lasix, K, new pICC  Billy Fischer 12/17/2010, 2:16 PM

## 2010-12-17 NOTE — Plan of Care (Signed)
Problem: Phase I Progression Outcomes Goal: Pain controlled Outcome: Not Progressing Still requesting pain medication regularly

## 2010-12-17 NOTE — Plan of Care (Signed)
Problem: Phase I Progression Outcomes Goal: Progress activity as tolerated unless otherwise ordered Outcome: Not Progressing Has refused work with PT in the past.  Working with PT today as I write this.  They are talking about sitting him up on the side of the bed. Goal: Tolerating diet Outcome: Progressing PEG tube feedings

## 2010-12-17 NOTE — Progress Notes (Addendum)
Physical Therapy Treatment Patient Details Name: [name] MRN: [id number] DOB: 11/19/1965 Today's Date: 12/17/2010  PT Assessment/Plan  PT - Assessment/Plan PT Plan: Discharge plan remains appropriate;Frequency needs to be updated (SNF/LTACH;   goals updated) PT Frequency: Min 2X/week Follow Up Recommendations: Skilled nursing facility;LTACH Equipment Recommended: Defer to next venue PT Goals  Acute Rehab PT Goals PT Goal Formulation: With patient Time For Goal Achievement: 2 weeks Pt will Roll Supine to Right Side: Other (comment) (min guard A) PT Goal: Rolling Supine to Right Side - Progress: Progressing toward goal (revised) Pt will Roll Supine to Left Side: Other (comment) (min guard A) PT Goal: Rolling Supine to Left Side - Progress: Progressing toward goal (revised) Pt will Sit at [facility] of Bed: with min assist ( ) PT Goal: Sit at [facility] Of Bed - Progress: Progressing toward goal (revised) Pt will Transfer Sit to Stand/Stand to Sit: with mod assist;with upper extremity assist PT Transfer Goal: Sit to Stand/Stand to Sit - Progress: Revised (modified due to lack of progress/goal met) Pt will Transfer Bed to Chair/Chair to Bed: with +2 total assist (pt =60) PT Transfer Goal: Bed to Chair/Chair to Bed - Progress: Revised (modified due to lack of progress/goal met)  PT Treatment Precautions/Restrictions  Precautions Precautions: Other (comment) (trach) Precaution Comments: morbid obesity Required Braces or Orthoses: No Restrictions Weight Bearing Restrictions: No Mobility (including Balance) Bed Mobility Bed Mobility: Yes Rolling Right: 4: Min assist Rolling Left: 4: Min assist Supine to Sit: 3: Mod assist  Balance Balance Assessed: Yes Static Sitting Balance Static Sitting - Comment/# of Minutes: 20 (min guard assist while back washed, gown/ leads changed) Exercise    End of Session PT - End of Session Activity Tolerance:  (c/o dizziness) Patient left: in  bed General Behavior During Session: [facility] for tasks performed Cognition: [facility] for tasks performed  Rebekah Sprinkle, Eliseo Gum 12/17/2010, 12:45 PM  12/17/2010  [place], PT [phone] (pager)

## 2010-12-17 NOTE — Progress Notes (Signed)
PCCM notified of pts K level of 3.2.  Told to write a sticky note in regards to this and wait for rounding.  Pt is currently stable.

## 2010-12-17 NOTE — Progress Notes (Signed)
Occupational Therapy Treatment Patient Details Name: [name] MRN: [id number] DOB: [birth year] Today's Date: 12/17/2010  OT Assessment/Plan OT Assessment/Plan OT Plan: Discharge plan remains appropriate OT Frequency: Min 1X/week Follow Up Recommendations: LTACH Equipment Recommended: Defer to next venue OT Goals Acute Rehab OT Goals OT Goal Formulation: With patient Time For Goal Achievement: 2 weeks  OT Treatment Precautions/Restrictions  Precautions Precautions: Other (comment) (trach) Precaution Comments: morbid obesity Required Braces or Orthoses: No Restrictions Weight Bearing Restrictions: No   ADL ADL Eating/Feeding: NPO Grooming: Performed;Wash/dry face;Set up Grooming Details (indicate cue type and reason): Assist for thoroughness due to patient fatiguing quickly with activity Where Assessed - Grooming: Supine, head of bed up Upper Body Bathing: Not assessed Lower Body Bathing: Not assessed Upper Body Dressing: Performed;Minimal assistance Upper Body Dressing Details (indicate cue type and reason): Assist for pt. to don right sleeve and pull gown up whole arm Where Assessed - Upper Body Dressing: Supine, head of bed up Lower Body Dressing: Performed;+1 Total assistance Lower Body Dressing Details (indicate cue type and reason): With donning bilateral socks due to pt. unable to bend forward to reach feet or cross foot over opposite knee Where Assessed - Lower Body Dressing: Sitting, bed Toilet Transfer: Not assessed Toilet Transfer Method: Not assessed Toileting - Clothing Manipulation: Not assessed Toileting - Hygiene: Not assessed Tub/Shower Transfer: Not assessed Tub/Shower Transfer Method: Not assessed ADL Comments: Pt. with increased motivation today for OOB activity, however continues to have decreased activity tolerance. Pt. Completed EOB sitting of ~34mins, however reports dizziness and increased fatigue requesting to return to supine  position. Mobility  Bed Mobility Bed Mobility: Yes Rolling Right: 4: Min assist Rolling Left: 4: Min assist Supine to Sit: 3: Mod assist Supine to Sit Details (indicate cue type and reason): Pt. with UE support to achieve sitting EOB with use of bed rail and trunk facilitation and bilateral LE management Transfers Transfers: No     End of Session OT - End of Session Activity Tolerance: Patient limited by pain Patient left: in bed;with call bell in reach Nurse Communication: Other (comment) (activity tolerance with ADLs) General Behavior During Session: [facility] for tasks performed Cognition: [facility] for tasks performed  Co-evaluation with Flora Lipps, OTR/L Pager: [phone]  12/17/2010, 1:26 PM

## 2010-12-18 DIAGNOSIS — J9620 Acute and chronic respiratory failure, unspecified whether with hypoxia or hypercapnia: Secondary | ICD-10-CM

## 2010-12-18 DIAGNOSIS — Z9911 Dependence on respirator [ventilator] status: Secondary | ICD-10-CM

## 2010-12-18 DIAGNOSIS — E662 Morbid (severe) obesity with alveolar hypoventilation: Secondary | ICD-10-CM

## 2010-12-18 LAB — IMMUNOFIXATION ADD-ON

## 2010-12-18 LAB — PROTEIN ELECTROPH W RFLX QUANT IMMUNOGLOBULINS
Alpha-2-Globulin: 16.1 % — ABNORMAL HIGH (ref 7.1–11.8)
M-Spike, %: 0.38 g/dL
Total Protein ELP: 6.7 g/dL (ref 6.0–8.3)

## 2010-12-18 LAB — UIFE/LIGHT CHAINS/TP QN, 24-HR UR
Albumin, U: DETECTED
Beta, Urine: DETECTED — AB
Free Kappa/Lambda Ratio: 1.07 ratio — ABNORMAL LOW (ref 2.04–10.37)
Free Lambda Lt Chains,Ur: 258 mg/dL — ABNORMAL HIGH (ref 0.02–0.67)
Total Protein, Urine: 539.2 mg/dL

## 2010-12-18 LAB — GLUCOSE, CAPILLARY
Glucose-Capillary: 145 mg/dL — ABNORMAL HIGH (ref 70–99)
Glucose-Capillary: 119 mg/dL — ABNORMAL HIGH (ref 70–99)
Glucose-Capillary: 120 mg/dL — ABNORMAL HIGH (ref 70–99)

## 2010-12-18 MED ORDER — OSMOLITE 1.2 CAL PO LIQD
1000.0000 mL | ORAL | Status: DC
  Administered 2010-12-18 – 2010-12-20 (×3): 1000 mL
  Filled 2010-12-18 (×6): qty 1000

## 2010-12-18 MED ORDER — SENNOSIDES 8.8 MG/5ML PO SYRP
10.0000 mL | ORAL_SOLUTION | Freq: Two times a day (BID) | ORAL | Status: DC
  Administered 2010-12-18 – 2010-12-24 (×9): 10 mL
  Filled 2010-12-18 (×17): qty 10

## 2010-12-18 NOTE — Progress Notes (Signed)
Nutrition Follow-Up/Tube Feeding Consult  PEG placed 11/9 by IR. NPO, on vent. Weight 167.3 kg (368 lb) 11/13, range 361-383 lb 11/4-11/13 Admission weight 199.4 kg (439 lb) on 11/17/10   TF with Osmolite 1.2 at 25 ml/hr via PEG Pro-stat 64 30 ml six times daily IVF with D5NS at 50 ml/hr  Nutrition needs re-estimated as 2500-2700 kcal/day [based on PSU(m) and IJ] and 130-160 grams protein/day.    Nutrition dx:  Inadequate oral intake is ongoing.  Goal:  Meet 60-70% kcal needs (1700-2000 kcal/day) and 100% minimum protein needs with TF.  Not met.  Plan:  TF goal of Osmolite 1.2 at 45 ml/hr with Pro-stat 64 as ordered (30 ml 6x/day) meets above goal and provides 1728 kcal, 150 g protein, 886 ml free water, and 108% RDIs.  Will increase TF by 10 ml q 4 hours per protocol.  RD to follow.  Pager [phone]

## 2010-12-18 NOTE — Consult Note (Signed)
WOC consult Note Reason for Consult: Consult requested for trach site wounds. Wound type: Full thickness erosion wounds. Measurement: Right side of trach .2X2..1cm, left side of trach .2X.2X.1cm Wound bed: Pink and moistDrainage (amount, consistency, odor)  Dressing procedure/placement/frequency: Apply Aquacel to absorb drainage and provide antimicrobial benefits.  Foam dressing over this to protect from trach secretions.  Trach sponges may be changed PRN if trach secretions soil area. Will not plan to follow further unless re-consulted.  [address] RN, MSN, [employer]  [phone]

## 2010-12-18 NOTE — Progress Notes (Signed)
CSW followed up with [facility] as they were considering accepting this pt to their facility.  Upon review, SNF stated they are unable to accept pts who are on a cuffed trach.  CSW contacted [facility] in [place], [place] and submitted additional information requested by [facility].  CSW continues to assist this pt in locating a secure dc plan.

## 2010-12-18 NOTE — Progress Notes (Signed)
Brief 45 yoM with Acute and Chronic Resp failure/ vent/ trach, OHS, decubiti, monoclonal gammopathy, fever.  Pending culture data Sputum 11/13>>> PCT 11/13>>>  Studies/Results: 12/11/10 FEES ++ dysphagia  Major Event: Right arm PICC lines became dysfunction - flip to IJ Left arm PICC line placed 12/13/10 >>>  Subjective: Currently cycling on pressure support, 22 cmH2O. Tidal volumes acceptable work of breathing currently acceptable.  Objective: Vital signs in last 24 hours: Temp:  [98.1 F (36.7 C)-100.1 F (37.8 C)] 98.4 F (36.9 C) (11/13 0738) Pulse Rate:  [94-105] 94  (11/13 0900) Resp:  [15-34] 20  (11/13 0900) BP: (82-127)/(30-67) 89/58 mmHg (11/13 0900) SpO2:  [95 %-100 %] 97 % (11/13 0900) FiO2 (%):  [29.8 %-40.1 %] 30.2 % (11/13 0900) Weight:  [167.3 kg (368 lb 13.3 oz)] 368 lb 13.3 oz (167.3 kg) (11/13 0500)  Intake/Output Summary (Last 24 hours) at 12/18/10 1111 Last data filed at 12/18/10 0100  Gross per 24 hour  Intake   1175 ml  Output    900 ml  Net    275 ml     Physical Exam: General:  Obese male in no acute distress but is upset because he is not allow to eat HEENT:  NCAT, EOMI, nonicteric sclera, no JVD, no LAD, nursing reports increased. A sputum from tracheostomy LUNGS:  Coarse due to tracheotomy and vent, no wheeze HEART:  RRR, no murmurs/rubs/gallops ABD:  Obesely distended, (+)BS, soft, nontender, no guarding EXTR:  Intact, no cyanosis, (+1) edema bilatrally  Lab Results: No results found for this basename: WBC:3,HGB:3,HCT:3,PLT:3 in the last 72 hours BMET  [facility] 12/17/10 0550 12/16/10 0543  NA 140 142  K 3.2* 3.1*  CL 97 97  CO2 34* 37*  GLUCOSE 135* 135*  BUN 15 15  CREATININE 0.90 1.01  CALCIUM 8.8 8.9    Assessment/Plan:  1.  Acute on chronic respiratory failure, in setting of Obesity hypoventilation syndrome. Further complicated by deconditioning, and abdominal pain.  Still requiring very high levels of pressure support.  Plan:  - Continue with PSV wean as tolerated, but likely will need LTAC - aim for negative fluid balance.  - expect best case scenario here will be trach (indef), with nocturnal vent.  - check sputum and procalcitonin  2. Fever (12/09/2010). This has been an ongoing issue. Off all antibiotics.  Plan: - As above  3.  Sacral Wound Plan: -continue w/ wound care.   4. abd pain. Constipation   plan: -will give LOC  5. Nutrition   Plan: nutrition support for advancement of diet  Will have extenisve d/w pt , so to undersatnd importance for tube feeds and nutrition needs Continue PS attempts, lasix, K, new pICC  BABCOCK,PETE 12/18/2010, 11:11 AM  Pt seen and examined and database reviewed. I agree with above findings, assessment and plan  Billy Fischer, MD;  PCCM service; [place] [phone]

## 2010-12-19 DIAGNOSIS — E662 Morbid (severe) obesity with alveolar hypoventilation: Secondary | ICD-10-CM

## 2010-12-19 DIAGNOSIS — Z9911 Dependence on respirator [ventilator] status: Secondary | ICD-10-CM

## 2010-12-19 DIAGNOSIS — J9620 Acute and chronic respiratory failure, unspecified whether with hypoxia or hypercapnia: Secondary | ICD-10-CM

## 2010-12-19 LAB — GLUCOSE, CAPILLARY
Glucose-Capillary: 147 mg/dL — ABNORMAL HIGH (ref 70–99)
Glucose-Capillary: 124 mg/dL — ABNORMAL HIGH (ref 70–99)
Glucose-Capillary: 125 mg/dL — ABNORMAL HIGH (ref 70–99)

## 2010-12-19 LAB — BASIC METABOLIC PANEL
CO2: 34 mEq/L — ABNORMAL HIGH (ref 19–32)
Chloride: 101 mEq/L (ref 96–112)
Creatinine, Ser: 0.86 mg/dL (ref 0.50–1.35)
Glucose, Bld: 130 mg/dL — ABNORMAL HIGH (ref 70–99)

## 2010-12-19 MED ORDER — COLLAGENASE 250 UNIT/GM EX OINT
TOPICAL_OINTMENT | Freq: Every day | CUTANEOUS | Status: DC
  Administered 2010-12-19: 1 via TOPICAL
  Administered 2010-12-20 – 2010-12-24 (×3): via TOPICAL
  Filled 2010-12-19: qty 30

## 2010-12-19 MED ORDER — PRO-STAT SUGAR FREE PO LIQD
30.0000 mL | ORAL | Status: DC
  Administered 2010-12-19 – 2010-12-26 (×39): 30 mL
  Filled 2010-12-19 (×50): qty 30

## 2010-12-19 MED ORDER — POTASSIUM CHLORIDE 20 MEQ/15ML (10%) PO LIQD
40.0000 meq | Freq: Once | ORAL | Status: AC
  Administered 2010-12-19: 40 meq
  Filled 2010-12-19 (×2): qty 30

## 2010-12-19 MED ORDER — FREE WATER
200.0000 mL | Freq: Four times a day (QID) | Status: DC
  Administered 2010-12-19 – 2010-12-25 (×22): 200 mL
  Filled 2010-12-19 (×36): qty 200

## 2010-12-19 NOTE — Progress Notes (Signed)
  12/19/2010  Afton Bing, PT [phone] (pager)

## 2010-12-19 NOTE — Progress Notes (Signed)
Speech Pathology Note Telephone consultation with Dirk Dress, NP regarding patient's past FEES results and possible PO plan.  Reviewed the initial recommendation of Dys.1 (puree) and Honey-thick liquids following 12/11/10 FEES, however this SLP and Dr.Simonds both agreed that the patient was too risky to begin PO diet due to pulmonary issues as the time, aspiration risk with inability to clear aspirate due to inflated cuff as well as behavioral issues of likely non-compliance with recommendations (see 11/6 FEES note). Plan:  1. Per NP, patient tolerating PSV and has improved.  Will repeat FEES today to determine readiness for PO diet with less aspiration risk and for quality of life purposes. Results to follow  Thank you for this re-consultation. Myra Rude, M.S.,CCC-SLP Pager [phone]

## 2010-12-19 NOTE — Progress Notes (Addendum)
Nutrition Follow-Up/Tube Feeding  Spoke with SLP.  Pt passed FEES-regular consistency with thin liquids (low sodium) diet started.  TF:  Osmolite 1.2 at 45 ml/hr via PEG Prostat 64 30 ml six times daily Free water:   4x/day IVF d5ns @50  ml/hr  Nutrition Needs:  2500-2600 kcals, 130-160g protein/day  Nutrition dx:  Inadequate oral intake is ongoing  Goal:  Maximize po, reduce TF as intake improves.  Plan:  Will change TF to 10 hours per night with Osmolite 1.2  Continue Prostat 30 ml six times daily.  This will provide:  972 kcals, 115g protein per day.  Monitor intake.  Decrease TF as intake improves.  Oran Rein, RD [phone]

## 2010-12-19 NOTE — Progress Notes (Signed)
CSW met with pt and assisted with [facility] Application.  CSW received permission from pt to consult with [facility] [employer] for information pertinent to [facility] Application.  CSW submitted Medicaid Application to appropriate personnel.  CSW will continue to follow and assist as needed.

## 2010-12-19 NOTE — Progress Notes (Signed)
Attempted PT/OT treatment session, however pt. Currently undergoing FEES. Will re-attempt 12/20/10. Thanks! Cassandria Anger, OTR/L Pager: [phone] 12/19/2010 .

## 2010-12-19 NOTE — Progress Notes (Signed)
Physical Therapy Wound Treatment Patient Details  Name: [name] MRN: [id number] Date of Birth: 1965-09-06  Today's Date: 12/19/2010 Time: 1135- 1211    Subjective  Patient and Family Stated Goals: Pt. unable to state due to vent but agreeable to work toward cleaning up wound. Date of Onset:  (several weeks.) Prior Treatments: Hydrotherapy, santyl with dressing changes.  Pain Score: Pain Score: 10-Worst pain ever  Wound Assessment  Pressure Ulcer Unstageable - Full thickness tissue loss in which the base of the ulcer is covered by slough (yellow, tan, gray, green or brown) and/or eschar (tan, brown or black) in the wound bed. with dressing dry and intact (Active)  State of Healing Eschar 12/19/2010  2:40 PM  Site / Wound Assessment Black;Red 12/19/2010  2:40 PM  Peri-wound Assessment Intact 12/19/2010  2:40 PM  Wound Length (cm) 13 cm 12/19/2010  2:40 PM  Wound Width (cm) 31 cm 12/19/2010  2:40 PM  Margins Unattacted edges (unapproximated) 12/19/2010  9:00 AM  Drainage Amount Moderate 12/19/2010  2:40 PM  Drainage Description Odor;Serosanguineous;Purulent 12/19/2010  2:40 PM  Treatment Debridement (Selective);Hydrotherapy (Pulse lavage);Packing (Saline gauze) 12/19/2010  2:40 PM  Dressing Type ABD;Barrier Film (skin prep);Moist to dry 12/19/2010  2:40 PM  Dressing Changed 12/19/2010  2:40 PM      Hydrotherapy Pulsed lavage therapy - wound location: sacrum Pulsed Lavage with Suction (psi):  (4-12 psi) Pulsed Lavage with Suction - Normal Saline Used: 1000 mL Pulsed Lavage Tip: Tip with splash shield Selective Debridement Selective Debridement - Location: sacrum Selective Debridement - Tools Used: Forceps;Scalpel;Scissors Selective Debridement - Tissue Removed: black eschar   Wound Assessment and Plan  Wound Therapy - Assess/Plan/Recommendations Wound Therapy - Clinical Statement: Hydrotherapy restarted now that eschar is beginning to loosen.  Pt. with large unstageable  ulcer that will benefit from hydrotherapy to decr eschar and promote healing. Wound Therapy - Functional Problem List: Decr sitting time due to sacral wound. Factors Delaying/Impairing Wound Healing: Incontinence;Immobility;Multiple medical problems;Polypharmacy Hydrotherapy Plan: Debridement;Dressing change;Patient/family education;Pulsatile lavage with suction Wound Therapy - Frequency: 6X / week Wound Therapy - Current Recommendations: Surgery consult Wound Therapy - Follow Up Recommendations:  (LTACH)  Wound Therapy Goals- Improve the function of patient's integumentary system by progressing the wound(s) through the phases of wound healing (inflammation - proliferation - remodeling) by: Decrease Necrotic Tissue to: 50% Decrease Necrotic Tissue - Progress: Not met Increase Granulation Tissue to: 50% Increase Granulation Tissue - Progress: Not met Patient/Family will be able to : acknowledge positioning Patient/Family Instruction Goal - Progress: Not met  Goals will be updated until maximal potential achieved or discharge criteria met.  Discharge criteria: when goals achieved, discharge from hospital, MD decision/surgical intervention, no progress towards goals, refusal/missing three consecutive treatments without notification or medical reason.  [name] 12/19/2010, 2:49 PM [facility] PT [phone]

## 2010-12-19 NOTE — Consult Note (Signed)
WOC consult Note  Reason for Consult: re eval. For sacral pressure ulcer management.  Pt has large unstageable  pressure ulcer extends across sacral area, in gluteal folds and extends into bilateral buttocks.  Wound type: Unstageable pressure ulcer   Pressure Ulcer POA: No  Measurement: aprox 13cm x 10cm on right sacral area, unable to measure left side of pressure ulcer at today's assessment. Note however that pt does have some undermining in area of gluteal folds aprox. 4 cm deep.   Wound bed: 50% soft eschar present in the center of area on the right, 65% soft necrotic tissue on the area to the left of the gluteal folds.    Drainage (amount, consistency, odor) minimal odor, with thin yellow drainage  Periwound: intact without problems noted   Dressing procedure/placement/frequency: Will re-order hydrotherapy in combination with enzymatic debridement to continue to clean wounds of necrotic tissue.  Hydrotherapy has been used in the past however was not making progress due to thickness of eschar, now that necrotic tissue has been softened by collagenase will attempt further debridement with this therapy.  NOTE: This pt would benefit from surgical consult again for wide debridement and for possible diverting ostomy as best treatment option, this would allow for complete removal of all necrotic tissue at one time, since other methods of debridement over last 2 wks have been slow to progress area. Will follow up with nursing in unit to make sure critical care aware of this recommendation.  Will contact PT department with new hydrotherapy orders to restart today if possible and if pt will allow tx to be done. Will follow at least weekly for progress of this wound. Thanks  Ikeya Brockel [employer], CWOCN [phone])

## 2010-12-19 NOTE — Progress Notes (Signed)
eLink Physician-Brief Progress Note Patient Name: [name] DOB: 1965/05/19 MRN: [id number]  Date of Service  12/19/2010   HPI/Events of Note   Hypokalemia  eICU Interventions  Potassium replaced      [name] 12/19/2010, 5:35 AM

## 2010-12-19 NOTE — Procedures (Signed)
FEES Procedure Note Patient Details  Name: [name] MRN: [id number] Date of Birth: Nov 16, 1965  Today's Date: 12/19/2010 Time: 7253-6644 Time Calculation (min): 36 min  Past Medical History:  Past Medical History  Diagnosis Date  . Hypertension   . CHF (congestive heart failure)   . Ventilator dependent    Past Surgical History: No past surgical history on file. HPI:     Recommendation/Prognosis  Clinical Impression Clinical impression: Patient exhibits improved swallow function since previous eval., now with a mild phangyngeal dysphagia as evidenced by delay in swallow initiation and mild residue after the initial swallow.  The patient was able to clear this with a repeat dry swallow.  There was mild back flow of thin and nectar thick liquids from the esophagus onto the UES, with a trace amount noted at the interarytenoid space, which cleared with a repeat dry swallow.  There was no definite aspiration or penetration observed, however, the airway and the laryngeal verstibule were difficult to visulaize due to the close approximation of the edematous appearing epiglottis with the posterior pharyngeal wall.  As a result, SLP observed a very narrow upper airway opening, impeded by the aforementioned, as well as the almost constant vocal cord adduction.  The arytenoids also appeared edematous and  tensely adducted, with verbal cues required to inhale for vocal cord abbduction and airway visualiztion.  Prognosis for diet tolerance is like good, given aspiration precautions are followed.  Patient will need supervision during meals secondary to impulsivity and questionable cognitive function.  FEES was conducted with ventilator on PSV = 20, which may be why patient was able to access a more functional swallow today.  Recommendations Solid Consistency: Regular Liquid Consistency: Thin Liquid Administration via: Straw Medication Administration: Whole meds with puree Supervision: Full  supervision/cueing for compensatory strategies Compensations: Slow rate;Small sips/bites;Multiple dry swallows after each bite/sip Postural Changes and/or Swallow Maneuvers: Seated upright 90 degrees (As upright as possible in current bed.) Oral Care Recommendations: Oral care QID;Staff/trained caregiver to provide oral care Other Recommendations: Clarify dietary restrictions  Prognosis Prognosis for Safe Diet Advancement: Good Barriers to Reach Goals: Cognitive deficits;Behavior (Impulsivity) Individuals Consulted Consulted and Agree with Results and Recommendations: PA;Dietician;RN  SLP Assessment/Plan SLP will f/u for assessment of diet toleration 2 times per week.  Patient will be seen 11/15 by SLP. SLP Goals   1.  Patient will tolerate a Regular consistency diet with thin liquids, without s/s of aspiration.  2.  Patient will verbalize and demonstrate aspiration precautions with min. Verbal cues.  General:  Date of Onset: 11/18/10 Other Pertinent Information: Vent Settings: PRVC mode; Vt 500?;RR20; Peep 5: Fio2@40 %.   #7 XLT Shiley trach, cuffed, inflated Type of Study: Repeat FEES Diet Prior to this Study: NPO Temperature Spikes Noted: No Respiratory Status: Trach (Ventilator via trach) Trach Size and Type: Extra long (7 Shiley) History of Intubation: Yes Length of Intubations (days): 11 days Date extubated: 11/30/10 Behavior/Cognition: Alert;Cooperative;Impulsive Oral Cavity - Dentition: Adequate natural dentition Oral Motor / Sensory Function: Within functional limits Vision: Functional for self-feeding Patient Positioning: Partially reclined Baseline Vocal Quality: Aphonic Volitional Cough: Strong Volitional Swallow: Able to elicit Anatomy: Other (Comment) (Edema vs. fatty tissue impedes visualization of airway.  ) Pharyngeal Secretions: Normal Ice chips: Tested (comment)  Reason for Referral:  Dysphagia.  Question if safe to initiate p.o. Diet.  Oral Phase Oral  Assessment (Complete on admission/transfer/change in patient condition) Lips: Dry, cracked Tongue: Coated Mucous membranes: Reddened Nutritional status: Fluids only or NPO for >  24 hours Oxygen therapy: CPAP/BiPAP or Ventilator Oral Preparation/Oral Phase Oral Phase: WFL Pharyngeal Phase  Pharyngeal Phase Pharyngeal Phase: Impaired Pharyngeal - Pudding Pharyngeal - Pudding Teaspoon: Delayed swallow initiation;Premature spillage to valleculae;Other (Comment) (Limited due to excess posterior pharyngeal wall tissue.) Pharyngeal - Honey Pharyngeal - Honey Cup: Not tested Pharyngeal - Nectar Pharyngeal - Nectar Cup: Delayed swallow initiation;Premature spillage to valleculae Pharyngeal - Solids Pharyngeal - Puree: Delayed swallow initiation;Premature spillage to valleculae;Compensatory strategies attempted (Comment) (Repeat dry swallows.) Pharyngeal Phase - Comment Pharyngeal Comment: A nodular appearing protrusion was noted at the base of tongue. Cervical Esophageal Phase  Cervical Esophageal Phase Cervical Esophageal Phase: Impaired Cervical Esophageal Phase - Comment Cervical Esophageal Comment: A small amount of liquids moved back through the UES (This was cleared with a repeat dry swallow.)     Maryjo Rochester T 12/19/2010, 4:23 PM

## 2010-12-19 NOTE — Progress Notes (Signed)
Brief 45 yoM with Acute and Chronic Resp failure/ vent/ trach, OHS, decubiti, monoclonal gammopathy, fever.  Pending culture data Sputum 11/13>>> ngtd>>>> PCT 11/13>>> 0.13  Studies/Results: 12/11/10 FEES ++ dysphagia 11/14 FEES>>>  Major Event: Right arm PICC lines became dysfunction - flip to IJ Left arm PICC line placed 12/13/10 >>>  Subjective: Currently cycling on pressure support, 20 cmH2O. Tidal volumes acceptable work of breathing currently acceptable. "Hungry"  Objective: Vital signs in last 24 hours: Temp:  [98 F (36.7 C)-100.9 F (38.3 C)] 100.9 F (38.3 C) (11/14 0754) Pulse Rate:  [92-109] 92  (11/14 0800) Resp:  [17-33] 29  (11/14 0800) BP: (107-144)/(56-91) 130/72 mmHg (11/14 0800) SpO2:  [95 %-100 %] 100 % (11/14 0800) FiO2 (%):  [29.7 %-40.3 %] 40 % (11/14 0800) Weight:  [364 lb 6.7 oz (165.3 kg)] 364 lb 6.7 oz (165.3 kg) (11/14 0018)  Intake/Output Summary (Last 24 hours) at 12/19/10 0904 Last data filed at 12/19/10 0600  Gross per 24 hour  Intake   2425 ml  Output    900 ml  Net   1525 ml     Physical Exam: General:  Obese male in no acute distress but is upset because he is not allow to eat HEENT:  NCAT, EOMI, nonicteric sclera, no JVD, no LAD, nursing reports increased. A sputum from tracheostomy LUNGS:  Coarse due to tracheotomy and vent, no wheeze HEART:  RRR, no murmurs/rubs/gallops ABD:  Obesely distended, (+)BS, soft, nontender, no guarding EXTR:  Intact, no cyanosis, (+1) edema bilatrally  Lab Results:  BMET  [facility] 12/19/10 0435 12/17/10 0550  NA 144 140  K 3.0* 3.2*  CL 101 97  CO2 34* 34*  GLUCOSE 130* 135*  BUN 15 15  CREATININE 0.86 0.90  CALCIUM 9.0 8.8    Assessment/Plan:  1.  Acute on chronic respiratory failure, in setting of Obesity hypoventilation syndrome. Further complicated by deconditioning, and abdominal pain.  Still requiring very high levels of pressure support.  Plan:  - Continue with PSV wean as  tolerated, but likely will need LTAC v vent SNF - aim for negative fluid balance.  - expect best case scenario here will be trach (indef), with nocturnal vent.  - check sputum and procalcitonin  2. Fever This has been an ongoing issue. Off all antibiotics. Pct neg. Heme/Onc following.  Plan: - As above  3.  Sacral Wound Plan: -continue w/ wound care.   4. abd pain. Constipation.  Tol TF.   plan: -PRN LOC  5. Nutrition   Plan: nutrition support for advancement of diet  6. Dysphagia Plan -  Spoke to ST.  They will f/u.  May repeat FEES.  Likely pt ok to eat very modified diet with CLOSE supervision.  Will have extenisve d/w pt , so to undersatnd importance for tube feeds and nutrition needs  [facility] 12/19/2010, 9:04 AM   Pt seen and examined and database reviewed. I agree with above findings, assessment and plan  Billy Fischer, MD;  PCCM service; [place] [phone]

## 2010-12-20 DIAGNOSIS — L89109 Pressure ulcer of unspecified part of back, unspecified stage: Secondary | ICD-10-CM

## 2010-12-20 LAB — GLUCOSE, CAPILLARY
Glucose-Capillary: 137 mg/dL — ABNORMAL HIGH (ref 70–99)
Glucose-Capillary: 123 mg/dL — ABNORMAL HIGH (ref 70–99)
Glucose-Capillary: 122 mg/dL — ABNORMAL HIGH (ref 70–99)
Glucose-Capillary: 114 mg/dL — ABNORMAL HIGH (ref 70–99)

## 2010-12-20 MED ORDER — PANTOPRAZOLE SODIUM 40 MG PO PACK
40.0000 mg | PACK | Freq: Every day | ORAL | Status: DC
  Administered 2010-12-20 – 2010-12-24 (×5): 40 mg
  Filled 2010-12-20 (×7): qty 20

## 2010-12-20 NOTE — Progress Notes (Signed)
Physical Therapy Wound Treatment Patient Details  Name: [name] MRN: [id number] Date of Birth: [birth year]  Today's Date: 12/20/2010 Time: 1133-1208  Wound Assessment and Plan   Wound Therapy - Assess/Plan/Recommendations Wound Therapy - Clinical Statement: Pt's wound heavily contaminated by liquid stool.  Even using washcloths and with pulsatile lavage set at highest pressure setting unable to get wound fully cleaned of stool.    Subjective     Pain Score: Pain Score: 10-Worst pain ever  Wound Assessment  Pressure Ulcer Unstageable - Full thickness tissue loss in which the base of the ulcer is covered by slough (yellow, tan, gray, green or brown) and/or eschar (tan, brown or black) in the wound bed. with dressing dry and intact (Active)  State of Healing Eschar 12/20/2010  1:31 PM  Site / Wound Assessment Black;Pink 12/20/2010  1:31 PM  Peri-wound Assessment Intact 12/19/2010  8:00 PM  Wound Length (cm) 13 cm 12/19/2010  2:40 PM  Wound Width (cm) 31 cm 12/19/2010  2:40 PM  Margins Unattacted edges (unapproximated) 12/19/2010  9:00 AM  Drainage Amount Moderate 12/19/2010  8:00 PM  Drainage Description Odor;Serosanguineous;Purulent 12/19/2010  8:00 PM  Treatment Debridement (Selective);Hydrotherapy (Pulse lavage);Packing (Saline gauze) 12/20/2010  1:31 PM  Dressing Type ABD;Barrier Film (skin prep);Moist to dry 12/20/2010  1:31 PM  Dressing Changed 12/20/2010  1:31 PM      Red Granulation 40% Black 60%  Hydrotherapy Pulsed lavage therapy - wound location: sacrum Pulsed Lavage with Suction (psi):  (4-12 psi) Pulsed Lavage with Suction - Normal Saline Used: 1000 mL Pulsed Lavage Tip: Tip with splash shield Selective Debridement Selective Debridement - Location: sacrum Selective Debridement - Tools Used: Scalpel;Forceps Selective Debridement - Tissue Removed: black eschar   Wound Therapy Goals- Improve the function of patient's integumentary system by progressing the  wound(s) through the phases of wound healing (inflammation - proliferation - remodeling) by: Decrease Necrotic Tissue - Progress: Progressing toward goal Increase Granulation Tissue - Progress: Progressing toward goal Patient/Family Instruction Goal - Progress: Progressing toward goal  Goals will be updated until maximal potential achieved or discharge criteria met.  Discharge criteria: when goals achieved, discharge from hospital, MD decision/surgical intervention, no progress towards goals, refusal/missing three consecutive treatments without notification or medical reason.  Kamela Blansett 12/20/2010, 1:39 PM [employer] PT [phone]

## 2010-12-20 NOTE — Progress Notes (Signed)
Brief 45 yoM with Acute and Chronic Resp failure/ vent/ trach, OHS, decubiti, monoclonal gammopathy, fever.  Pending culture data Sputum 11/13>>> ngtd>>>> PCT 11/13>>> 0.13  Studies/Results: 12/11/10 FEES ++ dysphagia 11/14 FEES>>>  Major Event: Right arm PICC lines became dysfunction - flip to IJ Left arm PICC line placed 12/13/10 >>>  Subjective: Currently cycling on pressure support, 20 cmH2O. Tidal volumes acceptable work of breathing currently acceptable. "Hungry"  Objective: Vital signs in last 24 hours: Temp:  [98.3 F (36.8 C)-100.5 F (38.1 C)] 99.6 F (37.6 C) (11/15 0842) Pulse Rate:  [91-106] 91  (11/15 0800) Resp:  [9-29] 24  (11/15 0800) BP: (116-146)/(53-90) 122/75 mmHg (11/15 0800) SpO2:  [94 %-100 %] 100 % (11/15 0800) FiO2 (%):  [30 %-40 %] 40 % (11/15 0800) Weight:  [168 kg (370 lb 6 oz)] 370 lb 6 oz (168 kg) (11/15 0500)  Intake/Output Summary (Last 24 hours) at 12/20/10 1228 Last data filed at 12/20/10 1000  Gross per 24 hour  Intake   3980 ml  Output    476 ml  Net   3504 ml   Total I/O In: 1015 [P.O.:920; I.V.:50; Other:45] Out: 475 [Urine:475] Physical Exam: General:  Obese male in no acute distress but is upset because he is not allow to eat HEENT:  NCAT, EOMI, nonicteric sclera, no JVD, no LAD, nursing reports increased. A sputum from tracheostomy LUNGS:  Coarse due to tracheotomy and vent, no wheeze HEART:  RRR, no murmurs/rubs/gallops ABD:  Obesely distended, (+)BS, soft, nontender, no guarding EXTR:  Intact, no cyanosis, (+1) edema bilatrally  Lab Results:  BMET  [facility] 12/19/10 0435  NA 144  K 3.0*  CL 101  CO2 34*  GLUCOSE 130*  BUN 15  CREATININE 0.86  CALCIUM 9.0    Assessment/Plan:  1.  Acute on chronic respiratory failure, in setting of Obesity hypoventilation syndrome. Further complicated by deconditioning, and abdominal pain.  Still requiring very high levels of pressure support.  Plan:  - Continue with PSV wean  as tolerated, but likely will need LTAC v vent SNF - aim for negative fluid balance.  - expect best case scenario here will be trach (indef), with nocturnal vent.  - check sputum and procalcitonin  2. Fever This has been an ongoing issue. Off all antibiotics. Pct neg. Heme/Onc following.  Plan: - As above  3.  Sacral Wound Plan: -continue w/ wound care. Wound care team recommends re-eval by surgery to consider debridement and diverting colostomy  4. abd pain. Constipation resolved.  Tol TF.  Cont same  5. Nutrition   Cont TFs  6. Dysphagia Plan -  Spoke to ST.  They will f/u.  May repeat FEES.  Likely pt ok to eat very modified diet with CLOSE supervision.   Billy Fischer 12/20/2010, 12:28 PM

## 2010-12-20 NOTE — Progress Notes (Signed)
This patient was discussed at long LOS 12/19/10.

## 2010-12-20 NOTE — Progress Notes (Signed)
Speech Language/Pathology Speech Language Pathology Treatment Patient Details Name: [name] MRN: [id number] DOB: [birth year] Today's Date: 12/20/2010       Maryjo Rochester T 12/20/2010, 2:34 PM   ST Cancellation Note  ___ Treatment cancelled today due to patient sleeping.  RN requested SLP not wake patient, as he has been restless, tearful and anxious today, and has just dozed off.  RN reports that patient appeared to tolerate his lunch with no overt s/s of aspiration.  Patient ate approximately 50% of lunch today.  SLP will f/u 12/21/10.   Signature:  Maryjo Rochester T

## 2010-12-20 NOTE — Progress Notes (Signed)
Occupational Therapy Treatment Patient Details Name: [name] MRN: [id number] DOB: 1965/06/07 Today's Date: 12/20/2010  OT Assessment/Plan OT Assessment/Plan OT Plan: Discharge plan remains appropriate OT Frequency: Min 1X/week Follow Up Recommendations: Skilled nursing facility Equipment Recommended: Defer to next venue OT Goals Acute Rehab OT Goals OT Goal Formulation: With patient Time For Goal Achievement: 2 weeks ADL Goals Pt Will Perform Grooming: with set-up;Standing at sink ADL Goal: Grooming - Progress: Revised (modified due to goal met) Pt Will Perform Upper Body Bathing: with set-up;Standing at sink ADL Goal: Upper Body Bathing - Progress: Revised (modified due to goal met) Pt Will Perform Upper Body Dressing: with set-up;Standing ADL Goal: Upper Body Dressing - Progress: Revised (modified due to goal met) Pt Will Transfer to Toilet: with min assist;Stand pivot transfer;3-in-1 ADL Goal: Toilet Transfer - Progress: Revised (modified due to goal met) Additional ADL Goal #1:  (Met goal) Additional ADL Goal #2:  (Met goal)  OT Treatment Precautions/Restrictions  Precautions Precautions: Fall;Other (comment) (morbid obesity) Precaution Comments: morbid obesity Required Braces or Orthoses: No Restrictions Weight Bearing Restrictions: No   ADL ADL Eating/Feeding: Not assessed Grooming: Not assessed Upper Body Bathing: Not assessed Lower Body Bathing: Performed;+1 Total assistance Lower Body Bathing Details (indicate cue type and reason): Pt. incontinent of urine due to condom catheter becoming loose and provided assist to wash lower legs Where Assessed - Lower Body Bathing: Sitting, bed Upper Body Dressing: Performed;Minimal assistance Upper Body Dressing Details (indicate cue type and reason): Assist for pt. to don right sleeve and pull gown up whole arm Where Assessed - Upper Body Dressing: Sitting, bed Lower Body Dressing: Performed;+1 Total  assistance Lower Body Dressing Details (indicate cue type and reason): With donning bilateral socks due to pt. unable to bend forward to reach feet or cross foot over opposite knee Where Assessed - Lower Body Dressing: Sitting, bed Toilet Transfer: Performed;+2 Total assistance;Comment for patient % (pt=60%) Toilet Transfer Details (indicate cue type and reason): Max verbal cues and tactile cues for technique and hand placement on arm rests and RW Toilet Transfer Method: Surveyor, minerals: Set designer - Clothing Manipulation: Not assessed Toileting - Hygiene: +1 Total assistance;Performed (pt. unable to reach backside secondary to girth) Where Assessed - Toileting Hygiene: Sit to stand from 3-in-1 or toilet Tub/Shower Transfer: Not assessed Tub/Shower Transfer Method: Not assessed Equipment Used: Rolling walker ADL Comments: Pt. with increased motivation today for OOB activity, however continues to have decreased activity tolerance. Mobility  Bed Mobility Bed Mobility: Yes Rolling Right: 4: Min assist Rolling Right Details (indicate cue type and reason): Mod verbal cues for hand placement on bed rail Rolling Left: 4: Min assist Supine to Sit: 3: Mod assist;HOB elevated (Comment degrees) (30degrees) Transfers Transfers: Yes Sit to Stand: 1: +2 Total assist;From bed;With upper extremity assist;Other (comment) (pt=60%) Sit to Stand Details (indicate cue type and reason): vc's for hand placement Stand to Sit: 1: +2 Total assist;To chair/3-in-1;Other (comment) (pt=60%) Stand to Sit Details: vc's for hand placemtn     End of Session OT - End of Session Activity Tolerance: Patient tolerated treatment well Patient left: in bed;with call bell in reach Nurse Communication: Mobility status for transfers General Behavior During Session: [facility] for tasks performed Cognition: [facility] for tasks performed  Sedalia Greeson,OTR/L [phone]  12/20/2010, 12:08  PM

## 2010-12-20 NOTE — Progress Notes (Signed)
Pt severely anxious and attempting to climb out of bed.  Pt continuously stating "I'm hot".  Pulse ox reading 100 % and pt's vital signs are stable.  Gave  pt 2 mg prn versed IV.

## 2010-12-20 NOTE — Consult Note (Signed)
Reason for Consult:Sacral decubitus  Referring Physician: Sung Amabile   HPI: [name] is an 45 y.o. male whom we have seen before for sacral decubtius wound. He has receiving topical tx and hydrotherapy and the wound was previously clean and viable. However we are asked to re-eval as there has been regression of the wound with large areas of necrotic skin flaps and eschar that hydrotherapy is not making much progress with. The wound nurse has suggested debridement and possible colostomy as there has been stool contamination as well.  Past Medical History:  Past Medical History  Diagnosis Date  . Hypertension   . CHF (congestive heart failure)   . Ventilator dependent     Allergies:  Allergies  Allergen Reactions  . Tramadol Swelling    Medications: I have reviewed the patient's current medications.  ROS: See HPI for pertinent findings, otherwise complete 10 system review negative.  Physical Exam: Blood pressure 156/89, pulse 101, temperature 99.3 F (37.4 C), temperature source Axillary, resp. rate 30, height 5\' 7"  (1.702 m), weight 370 lb 6 oz (168 kg), SpO2 100.00%.  General Appearance:  Alert, cooperative, no distress, appears stated age  Neck: Trach intact, no issues  Lungs:   Coarse BS bilat.  Chest Wall:  No tenderness or deformity  Heart:  Regular rate and rhythm, S1, S2 normal, no murmur, rub or gallop. Carotids 2+ without bruit.  Abdomen:   Soft, non-tender, non distended. Bowel sounds active all four quadrants,  no masses, no organomegaly.  Genitalia:  Normal. No hernias  Rectal:  No external lesions,  No fissures, fistula  Skin: Large Stage 4 sacral decubitus with extension laterally onto each buttock. Mix of necrotic skin and eschar, with some underlying granulation. Small amt of stool contamination on (L)inf aspect of wound.  Neurologic: Normal affect, no gross deficits.     Labs: CBC No results found for this basename: WBC:2,HGB:2,HCT:2,PLT:2 in the last 72  hours MET  Basename 12/19/10 0435  NA 144  K 3.0*  CL 101  CO2 34*  GLUCOSE 130*  BUN 15  CREATININE 0.86  CALCIUM 9.0   No results found for this basename: PROT,ALBUMIN,AST,ALT,ALKPHOS,BILITOT,BILIDIR,IBILI,LIPASE in the last 72 hours PT/INR No results found for this basename: LABPROT:2,INR:2 in the last 72 hours ABG No results found for this basename: PHART:2,PCO2:2,PO2:2,HCO3:2 in the last 72 hours    No results found.  Assessment/Plan: Stage 4 sacral decubitus ulcer In need of excisional debridement in OR setting. Diverting colostomy is a very reasonable option at this point due to contamination but pt not ready yet. He is agreeable to allow [place] to perform OR debridement and we will try to get this done tomorrow. Cont tx otherwise.  Marianna Fuss 12/20/2010, 1:09 PM

## 2010-12-20 NOTE — Progress Notes (Signed)
Physical Therapy Treatment Patient Details Name: [name] MRN: [id number] DOB: 10-Oct-1965 Today's Date: 12/20/2010  PT Assessment/Plan  PT - Assessment/Plan Comments on Treatment Session: improvements in mobility/transfers, but quickly fatigues PT Plan: Discharge plan remains appropriate Follow Up Recommendations: Skilled nursing facility Equipment Recommended: Defer to next venue PT Goals  Acute Rehab PT Goals Pt will Roll Supine to Right Side: with +1 total assist PT Goal: Rolling Supine to Right Side - Progress: Progressing toward goal Pt will Roll Supine to Left Side: Other (comment) PT Goal: Rolling Supine to Left Side - Progress: Progressing toward goal PT Goal: Sit at Edge Of Bed - Progress: Met PT Transfer Goal: Sit to Stand/Stand to Sit - Progress: Progressing toward goal Pt will Transfer Bed to Chair/Chair to Bed: Other (comment) PT Transfer Goal: Bed to Chair/Chair to Bed - Progress: Progressing toward goal  PT Treatment Precautions/Restrictions  Precautions Precautions: Fall;Other (comment) (morbid obesity) Precaution Comments: morbid obesity Required Braces or Orthoses: No Restrictions Weight Bearing Restrictions: No Mobility (including Balance) Bed Mobility Bed Mobility: Yes Rolling Right: 4: Min assist Rolling Right Details (indicate cue type and reason): Mod verbal cues for hand placement on bed rail Rolling Left: 4: Min assist Supine to Sit: 3: Mod assist;HOB elevated (Comment degrees) (30degrees) Transfers Transfers: Yes Sit to Stand: 1: +2 Total assist;From bed;With upper extremity assist;Other (comment) (pt=60%) Sit to Stand Details (indicate cue type and reason): vc's for hand placement Stand to Sit: 1: +2 Total assist;To chair/3-in-1;Other (comment) (pt=60%) Stand to Sit Details: vc's for hand placemtn Stand Pivot Transfers: Other (comment) (pt=60%) Stand Pivot Transfer Details (indicate cue type and reason): manual A for truncal support and  maneuvering the RW. vc for hand placement Ambulation/Gait Ambulation/Gait: No  Balance Balance Assessed: Yes Exercise    End of Session PT - End of Session Activity Tolerance: Patient limited by fatigue Patient left: in bed Nurse Communication: Mobility status for transfers General Behavior During Session: [facility] for tasks performed Cognition: [facility] for tasks performed  Drue Camera, Eliseo Gum 12/20/2010, 12:09 PM  12/20/2010  Gordo Bing, PT [phone] (pager)

## 2010-12-21 ENCOUNTER — Inpatient Hospital Stay (HOSPITAL_COMMUNITY): Payer: Medicare Other | Admitting: Certified Registered Nurse Anesthetist

## 2010-12-21 ENCOUNTER — Encounter (HOSPITAL_COMMUNITY): Payer: Self-pay | Admitting: Certified Registered Nurse Anesthetist

## 2010-12-21 ENCOUNTER — Encounter (HOSPITAL_COMMUNITY)
Admission: EM | Disposition: A | Discharge status: Skilled Nursing Facility | Payer: Self-pay | Source: Home / Self Care | Attending: Pulmonary Disease

## 2010-12-21 DIAGNOSIS — L89309 Pressure ulcer of unspecified buttock, unspecified stage: Secondary | ICD-10-CM

## 2010-12-21 HISTORY — PX: WOUND DEBRIDEMENT: SHX247

## 2010-12-21 LAB — GLUCOSE, CAPILLARY
Glucose-Capillary: 102 mg/dL — ABNORMAL HIGH (ref 70–99)
Glucose-Capillary: 111 mg/dL — ABNORMAL HIGH (ref 70–99)
Glucose-Capillary: 118 mg/dL — ABNORMAL HIGH (ref 70–99)
Glucose-Capillary: 140 mg/dL — ABNORMAL HIGH (ref 70–99)
Glucose-Capillary: 149 mg/dL — ABNORMAL HIGH (ref 70–99)

## 2010-12-21 LAB — CBC
HCT: 23.2 % — ABNORMAL LOW (ref 39.0–52.0)
MCH: 24.3 pg — ABNORMAL LOW (ref 26.0–34.0)
MCV: 74.1 fL — ABNORMAL LOW (ref 78.0–100.0)
Platelets: 238 10*3/uL (ref 150–400)
RDW: 16.9 % — ABNORMAL HIGH (ref 11.5–15.5)

## 2010-12-21 LAB — CULTURE, RESPIRATORY W GRAM STAIN

## 2010-12-21 SURGERY — DEBRIDEMENT, WOUND
Anesthesia: General | Site: Buttocks | Wound class: Contaminated

## 2010-12-21 MED ORDER — CIPROFLOXACIN IN D5W 400 MG/200ML IV SOLN
400.0000 mg | Freq: Two times a day (BID) | INTRAVENOUS | Status: DC
  Administered 2010-12-21 – 2010-12-25 (×8): 400 mg via INTRAVENOUS
  Filled 2010-12-21 (×9): qty 200

## 2010-12-21 MED ORDER — PHENYLEPHRINE HCL 10 MG/ML IJ SOLN
INTRAMUSCULAR | Status: DC | PRN
  Administered 2010-12-21: 40 ug via INTRAVENOUS

## 2010-12-21 MED ORDER — LACTATED RINGERS IV SOLN
INTRAVENOUS | Status: DC | PRN
  Administered 2010-12-21: 16:00:00 via INTRAVENOUS

## 2010-12-21 SURGICAL SUPPLY — 25 items
BLADE SURG 10 STRL SS (BLADE) ×3 IMPLANT
BLADE SURG 15 STRL LF DISP TIS (BLADE) ×2 IMPLANT
BLADE SURG 15 STRL SS (BLADE) ×1
CLOTH BEACON ORANGE TIMEOUT ST (SAFETY) ×3 IMPLANT
COVER SURGICAL LIGHT HANDLE (MISCELLANEOUS) ×3 IMPLANT
DRAPE LAPAROTOMY TRNSV 102X78 (DRAPE) ×3 IMPLANT
DRAPE UTILITY 15X26 W/TAPE STR (DRAPE) ×6 IMPLANT
DRSG PAD ABDOMINAL 8X10 ST (GAUZE/BANDAGES/DRESSINGS) ×3 IMPLANT
ELECT CAUTERY BLADE 6.4 (BLADE) ×3 IMPLANT
GAUZE SPONGE 4X4 12PLY STRL LF (GAUZE/BANDAGES/DRESSINGS) ×3 IMPLANT
GLOVE BIOGEL PI IND STRL 7.0 (GLOVE) ×2 IMPLANT
GLOVE BIOGEL PI INDICATOR 7.0 (GLOVE) ×1
GLOVE SURG SIGNA 7.5 PF LTX (GLOVE) ×3 IMPLANT
GLOVE SURG SS PI 6.0 STRL IVOR (GLOVE) ×3 IMPLANT
GOWN PREVENTION PLUS XLARGE (GOWN DISPOSABLE) ×3 IMPLANT
KIT BASIN OR (CUSTOM PROCEDURE TRAY) ×3 IMPLANT
PACK SURGICAL SETUP 50X90 (CUSTOM PROCEDURE TRAY) ×3 IMPLANT
PENCIL BUTTON HOLSTER BLD 10FT (ELECTRODE) ×3 IMPLANT
SPONGE LAP 18X18 X RAY DECT (DISPOSABLE) ×3 IMPLANT
SYR BULB 3OZ (MISCELLANEOUS) ×3 IMPLANT
TAPE CLOTH SURG 4X10 WHT LF (GAUZE/BANDAGES/DRESSINGS) ×3 IMPLANT
TOWEL OR 17X24 6PK STRL BLUE (TOWEL DISPOSABLE) ×3 IMPLANT
TOWEL OR 17X26 10 PK STRL BLUE (TOWEL DISPOSABLE) ×3 IMPLANT
TUBE CONNECTING 12X1/4 (SUCTIONS) ×3 IMPLANT
YANKAUER SUCT BULB TIP NO VENT (SUCTIONS) ×3 IMPLANT

## 2010-12-21 NOTE — Progress Notes (Signed)
  Plan debridement of sacral decub today.  Risks of bleeding, ongoing infection, injury, etc., discussed in detail.  No plans for ostomy today.  Likelihood of success for today is good, but overall wound healing still remains difficult. 

## 2010-12-21 NOTE — Op Note (Signed)
11/16/2010 - 12/21/2010  4:11 PM  PATIENT:  [name]  45 y.o. male  PRE-OPERATIVE DIAGNOSIS:  sacral decubitus  POST-OPERATIVE DIAGNOSIS:  * No post-op diagnosis entered *  PROCEDURE:  Procedure(s): IRRIGATION AND DEBRIDEMENT PILONIDAL CYST  SURGEON:  Surgeon(s): Shelly Rubenstein, MD  PHYSICIAN ASSISTANT:   ASSISTANTS: none   ANESTHESIA:   general  EBL:  Total I/O In: 530 [P.O.:480; I.V.:50] Out: 1200 [Urine:1200]  BLOOD ADMINISTERED:none  DRAINS: none   LOCAL MEDICATIONS USED:  NONE  SPECIMEN:  No Specimen  DISPOSITION OF SPECIMEN:  N/A  COUNTS:  YES  TOURNIQUET:  * No tourniquets in log *  DICTATION: .Dragon Dictation The patient was brought to the operating room and identified as the correct patient. We left him on his bariatric bed. Gen. Anesthesia was then induced through his tracheostomy the patient was then placed on his left lateral decubitus side. All the dressing was then removed from his decubitus. The patient had eschars on both buttocks. After the patient was prepped, I excised the decubitus eschar on the right buttocks. I didn't debride some of the deeper tissue which was necrotic in the deep crevice of the sacral decubitus. This did not appear to communicate with the anus. I left a larger eschar in place on the left buttocks. Minimal bleeding was achieved any of the fatty tissue. I then packed the wound with gauze. The patient was then taken back to the intensive care unit. PLAN OF CARE: Admit to inpatient   PATIENT DISPOSITION:  ICU - intubated and hemodynamically stable.   Delay start of Pharmacological VTE agent (>24hrs) due to surgical blood loss or risk of bleeding:  {YES/NO/NOT APPLICABLE:20182

## 2010-12-21 NOTE — Anesthesia Postprocedure Evaluation (Signed)
  Anesthesia Post-op Note  Patient: [name]  Procedure(s) Performed:  DEBRIDEMENT WOUND - Sacral  Patient Location: ICU-2602  Anesthesia Type: General  Level of Consciousness: sedated  Airway and Oxygen Therapy: Patient placed on Ventilator (see vital sign flow sheet for setting)  Post-op Pain: mild, moderate  Post-op Assessment: Post-op Vital signs reviewed and Patient's Cardiovascular Status Stable  Post-op Vital Signs: stable  Complications: No apparent anesthesia complications

## 2010-12-21 NOTE — H&P (View-Only) (Signed)
  Plan debridement of sacral decub today.  Risks of bleeding, ongoing infection, injury, etc., discussed in detail.  No plans for ostomy today.  Likelihood of success for today is good, but overall wound healing still remains difficult.

## 2010-12-21 NOTE — Anesthesia Preprocedure Evaluation (Addendum)
Anesthesia Evaluation  Patient identified by MRN, date of birth, ID band Patient unresponsive    Airway       Dental   Pulmonary  Patient is a morbidly obese with a PMH significant for hypoventilation syndrome later respiratory failure then later tracheostomy brought to OR for I&D of pilonidal cyst.         Cardiovascular hypertension, +CHF     Neuro/Psych    GI/Hepatic   Endo/Other    Renal/GU      Musculoskeletal   Abdominal   Peds  Hematology   Anesthesia Other Findings   Reproductive/Obstetrics                          Anesthesia Physical Anesthesia Plan  ASA: IV  Anesthesia Plan: General   Post-op Pain Management:    Induction: Intravenous  Airway Management Planned: Tracheostomy  Additional Equipment:   Intra-op Plan:   Post-operative Plan:   Informed Consent: I have reviewed the patients History and Physical, chart, labs and discussed the procedure including the risks, benefits and alternatives for the proposed anesthesia with the patient or authorized representative who has indicated his/her understanding and acceptance.     Plan Discussed with: CRNA  Anesthesia Plan Comments:         Anesthesia Quick Evaluation

## 2010-12-21 NOTE — Progress Notes (Signed)
Speech Language/Pathology  Patient in OR for surgical debridement.  SLP treatment cancelled for today.  Will f/u for assessment of diet tolerance Nov. 19 am.  [name] T

## 2010-12-21 NOTE — Progress Notes (Signed)
Hydrotherapy Note Pt for surgical debridement today.  Will defer hydrotherapy today.  Please clarify if hydro to resume after surgery.  Harrison Mons [facility] PT [phone]

## 2010-12-21 NOTE — Progress Notes (Signed)
Nutrition Follow-up/Tube Feeding Management  TF:  Osmolite 1.2 at 45 ml/hr via PEG Prostat 64 30 ml six times daily Free Water:  4x/day IVF d5ns@50ml /hr  Weight:  168kg 11/15, 174kg 11/4  Labs:  Na:  144, K+=3, Bun:  15, Creat:  0.86, Glu:  130  Nutrition needs:  2500-2600 kcals, 130-160 grams protein/day  Diet:  CHO MOD low sodium held for debridement and irrigation of decubitus today  Discussed with nurse:  Tolerating TF and diet.  Intake good 50-100 percent per estimate which would provide 1300 calories and 55g protein if 100% consumed.  Order for nocturnal TF as previously planned not found.  Good urine output.     Nutrition Dx:  Inadequate oral intake improving. Goal:  Maximize po, reduce nutritional supplements per PEG as po improves.  Diet plus prostat to meet 100% estimated needs.  Plan:  Will D/C TF to prevent overfeeding  Continue Prostat 64 30ml 6x/day to provide 432 kcals/90g protein per day per PEG  Resume CHO MOD 2gm Na diet post op.  Continue to monitor nutritional status.   Oran Rein, RD [phone]

## 2010-12-21 NOTE — Progress Notes (Signed)
Clinical Social Worker continues to follow pt and assist with needs.  Natalee Tomkiewicz Marie Oboyle Morell Mears, LCSWA 

## 2010-12-21 NOTE — Progress Notes (Signed)
Brief 45 y/o AAM with Acute and Chronic Resp failure/ vent/ trach, OHS, severe sacral decubitus , monoclonal gammopathy, fever.  Pending culture data Sputum 11/13>>> abundant pseudomonas>>>pan sens PCT 11/13>>> 0.13  Studies/Results: 12/11/10 FEES ++ dysphagia 11/14 FEES>>>  Major Event: Right arm PICC lines became dysfunction - flip to IJ Left arm PICC line placed 12/13/10 >>>  Subjective Planned surgical debridement of sacral wound 11/16.  Remains on PSV of 20, forgets to breath on lower pressures.  Objective: Vital signs in last 24 hours: Filed Vitals:   12/21/10 0757 12/21/10 0815 12/21/10 0830 12/21/10 1110  BP:  126/69 107/53 117/58  Pulse:   95   Temp: 99.8 F (37.7 C)     TempSrc: Oral     Resp:  29 30   Height:      Weight:      SpO2:   99%     Intake/Output Summary (Last 24 hours) at 12/21/10 1138 Last data filed at 12/21/10 0800  Gross per 24 hour  Intake   3590 ml  Output   5100 ml  Net  -1510 ml   Total I/O In: 50 [I.V.:50] Out: 50 [Urine:50]  Physical Exam: General:  Obese male in no acute distress but is upset because he is not allowed to eat HEENT:  NCAT, EOMI, nonicteric sclera, no JVD, no LAD, nursing reports increased. sputum from tracheostomy LUNGS:  resp's even/non-labored, lungs bilaterally coarse HEART:  RRR, no murmurs/rubs/gallops ABD:  Obesely distended, (+)BS, soft, nontender, no guarding EXTR:  Intact, no cyanosis, (+1) edema bilatrally  Lab Results:  BMET  Basename 12/19/10 0435  NA 144  K 3.0*  CL 101  CO2 34*  GLUCOSE 130*  BUN 15  CREATININE 0.86  CALCIUM 9.0    Assessment/Plan:  1.  Acute on chronic respiratory failure, in setting of Obesity hypoventilation syndrome. Further complicated by deconditioning, and abdominal pain.  Still requiring very high levels of pressure support.  Now with respiratory culture positive for pseudomonas (pan sens), continues to run low grade fevers.   Plan:  - Continue with PSV wean as  tolerated, but likely will need LTAC v vent SNF - aim for negative fluid balance.  - expect best case scenario here will be trach (indef), with nocturnal vent.  - initiate cipro for pseudomonsa - check am cxr   2. Fever This has been an ongoing issue. Off all antibiotics. Pct neg. Heme/Onc following.  Plan: - As above  3.  Sacral Wound Plan: -continue w/ wound care.  -Planned surgical debridement 11/16 -WOC following  4. abd pain. Constipation resolved.  Tol TF.  Cont same  5. Nutrition   Cont TFs  6. Dysphagia Plan -  Spoke to ST.  They will f/u.  May repeat FEES.  Likely pt ok to eat very modified diet with CLOSE supervision.   Canary Brim, NP-C 12/21/2010, 11:38 AM

## 2010-12-21 NOTE — Interval H&P Note (Signed)
History and Physical Interval Note:   12/21/2010   8:07 AM   [name]  has presented today for surgery, with the diagnosis of sacral decubitus  The various methods of treatment have been discussed with the patient and family. After consideration of risks, benefits and other options for treatment, the patient has consented to  Procedure(s): IRRIGATION AND DEBRIDEMENT PILONIDAL CYST as a surgical intervention .  The patients' history has been reviewed, patient examined, no change in status, stable for surgery.  I have reviewed the patients' chart and labs.  Questions were answered to the patient's satisfaction.     Abigail Miyamoto A  MD

## 2010-12-21 NOTE — Transfer of Care (Addendum)
Immediate Anesthesia Transfer of Care Note  Patient: [name]  Procedure(s) Performed: * No procedures listed *  Patient Location: PACU and ICU  Anesthesia Type: General  Level of Consciousness: sedated  Airway & Oxygen Therapy: O2 via ambu to trach  Post-op Assessment: Report given to 2600 RN and Post -op Vital signs reviewed and stable  Post vital signs: stable  Complications: No apparent anesthesia complications

## 2010-12-21 NOTE — Anesthesia Procedure Notes (Signed)
Procedures

## 2010-12-22 ENCOUNTER — Inpatient Hospital Stay (HOSPITAL_COMMUNITY): Payer: Medicare Other

## 2010-12-22 ENCOUNTER — Encounter (HOSPITAL_COMMUNITY): Payer: Self-pay | Admitting: Anesthesiology

## 2010-12-22 ENCOUNTER — Encounter (HOSPITAL_COMMUNITY)
Admission: EM | Disposition: A | Discharge status: Skilled Nursing Facility | Payer: Self-pay | Source: Home / Self Care | Attending: Pulmonary Disease

## 2010-12-22 LAB — GLUCOSE, CAPILLARY
Glucose-Capillary: 107 mg/dL — ABNORMAL HIGH (ref 70–99)
Glucose-Capillary: 154 mg/dL — ABNORMAL HIGH (ref 70–99)
Glucose-Capillary: 141 mg/dL — ABNORMAL HIGH (ref 70–99)
Glucose-Capillary: 125 mg/dL — ABNORMAL HIGH (ref 70–99)

## 2010-12-22 SURGERY — IRRIGATION AND DEBRIDEMENT WOUND
Anesthesia: General | Wound class: Clean Contaminated

## 2010-12-22 NOTE — Progress Notes (Signed)
[facility] Progress Note 1 Day Post-Op  Subjective: [name].  [name] is still uncomfortable.  Objective: Vital signs in last 24 hours: Temp:  [97.1 F (36.2 C)-[name] F (37.9 C)] 99.3 F (37.4 C) (11/17 0752) Pulse Rate:  [94-108] 102  (11/17 0000) Resp:  [8-31] 22  (11/17 0359) BP: (107-136)/(52-69) 118/52 mmHg (11/17 0359) SpO2:  [95 %-100 %] 98 % (11/17 0359) FiO2 (%):  [29.8 %-60.3 %] 29.8 % (11/17 0359) Last BM Date: 12/21/10  Intake/Output from previous day: 11/16 0701 - 11/17 0700 In: 2916.7 [P.O.:1890; I.V.:776.7; IV Piggyback:200] Out: 5770 [Urine:5770] Intake/Output this shift: Total I/O In: -  Out: 800 [Urine:800]  General: Obese, morbidly.  No acute distress although t he [name] states he hurts.  Lungs: Clear.  With tracheostomy, on ventilator  Abd: Benign  Extremities: Not an issue  Neuro: Intact  Decubitus:  Deep crater ulceration mostly to [name] left of [name] midline, necrotic at [name] base.  I was able to pack an entire Kerlix roll in [name] wound.and then cover it with a second Kerlix.  Will need debridement again in [name] future.  At some point may need diversion, but nurses state that stool contamination is not a problem now.  Lab Results:  @LABLAST2 (wbc:2,hgb:2,hct:2,plt:2) BMET No results found for this basename: NA:2,K:2,CL:2,CO2:2,GLUCOSE:2,BUN:2,CREATININE:2,CALCIUM:2 in [name] last 72 hours PT/INR No results found for this basename: LABPROT:2,INR:2 in [name] last 72 hours ABG No results found for this basename: PHART:2,PCO2:2,PO2:2,HCO3:2 in [name] last 72 hours  Studies/Results: Dg Chest Port 1 View  12/22/2010  *RADIOLOGY REPORT*  Clinical Data: Respiratory failure  PORTABLE CHEST - 1 VIEW  Comparison: 12/15/2010  Findings: [name] tracheostomy and left arm PICC remain in place. Relatively low lung volumes as before.  Heart size upper limits normal.  No effusion. Central pulmonary vascular congestion.  No focal airspace consolidation.   Right lateral costophrenic angle is excluded.  IMPRESSION:  1.  Stable postoperative changes and low lung volumes.  No acute disease.  Original Report Authenticated By: Osa Craver, M.D.    Anti-infectives: Anti-infectives     Start     Dose/Rate Route Frequency Ordered Stop   12/21/10 1200   ciprofloxacin (CIPRO) IVPB 400 mg        400 mg 200 mL/hr over 60 Minutes Intravenous Every 12 hours 12/21/10 1151     12/14/10 1400   ceFAZolin (ANCEF) IVPB 1 g/50 mL premix  Status:  Discontinued        1 g 100 mL/hr over 30 Minutes Intravenous 3 times per day 12/14/10 1222 12/17/10 1415   12/14/10 0815   ceFAZolin (ANCEF) IVPB 1 g/50 mL premix        1 g 100 mL/hr over 30 Minutes Intravenous On call 12/14/10 0808 12/14/10 0952   12/14/10 0000   ceFAZolin (ANCEF) IVPB 1 g/50 mL premix  Status:  Discontinued     Comments: On call to Radiology      1 g 100 mL/hr over 30 Minutes Intravenous  Once 12/13/10 1452 12/13/10 1454          Assessment/Plan: s/p Procedure(s): DEBRIDEMENT WOUND Continue ABX therapy due to Post-op infection Continue dressing changes.  Possibly will need debridement again in [name] future.  LOS: 36 days   Marta Lamas. Gae Bon, MD, FACS [phone] [facility] 12/22/2010

## 2010-12-22 NOTE — Progress Notes (Signed)
Brief 45 y/o AAM with Acute and Chronic Resp failure/ vent/ trach, OHS, severe sacral decubitus, monoclonal gammopathy, fever.  Pending culture data Sputum 11/13>>> abundant pseudomonas>>>pan sens PCT 11/13>>> 0.13  Studies/Results: 12/11/10 FEES ++ dysphagia 11/14 FEES>>>  Major Event: Right arm PICC lines became dysfunction - flip to IJ Left arm PICC line placed 12/13/10 >>>  Subjective S/P surgical debridement of sacral decub 11/16 by DrBlackman, CCS. Trache- remains on PSV of 20, forgets to breath on lower pressures. Indicates some pain & pain meds helping...  Objective: Vital signs in last 24 hours: Filed Vitals:   12/22/10 0400 12/22/10 0752 12/22/10 0819 12/22/10 1146  BP:   124/67 124/57  Pulse:   92 97  Temp: 97.1 F (36.2 C) 99.3 F (37.4 C)    TempSrc: Axillary Oral    Resp:   28 27  Height:      Weight:      SpO2:   100% 98%    Intake/Output Summary (Last 24 hours) at 12/22/10 1203 Last data filed at 12/22/10 0945  Gross per 24 hour  Intake 3226.67 ml  Output   6520 ml  Net -3293.33 ml   Total I/O In: 360 [P.O.:360] Out: 800 [Urine:800]  Physical Exam: General:  Obese male in no acute distress on air bed for decubitus amangement HEENT:  Puffy tissue edema, short fat neck w/ trache; no JVD evident & no bruits. LUNGS:  resp's even/non-labored, lungs bilaterally coarse & decr BS... HEART:  RRR, no murmurs/rubs/gallops ABD:  Obesely distended, (+)BS, soft, nontender, no guarding EXTR:  Intact, no cyanosis, + tissue edema bilaterally  Lab Results:  CXR:  11/17 >  IMPRESSION:  Stable postoperative changes and low lung volumes. No acute disease.  BS:  CBGs last 24H > 102 - 136  BMet:  Last 11/12 >   OK x K=3.2 and we will follow up.   Assessment/Plan:  1.  Acute on chronic respiratory failure, in setting of Obesity hypoventilation syndrome. Further complicated by deconditioning, and abdominal pain.  Still requiring very high levels of pressure  support.  Now with respiratory culture positive for pseudomonas (pan sens), continues to run low grade fevers (99+)   Plan:  - Continue with PSV wean as tolerated, but likely will need LTAC v vent SNF - aim for negative fluid balance> f/u K - expect best case scenario here will be trach (indef), with nocturnal vent.  - initiate cipro for pseudomonas  2. Fever:  This has been an ongoing issue. Off all antibiotics. Pct neg. Heme/Onc following.  Plan: - As above  3.  Sacral Wound Plan: per DrWyatt today >> Decubitus: Deep crater ulceration mostly to the left of the midline, necrotic at the base- packed an entire Kerlix roll in the wound and then covered it with a second Kerlix. Will need debridement again in the future. At some point may need diversion, but nurses state that stool contamination is not a problem now.  - hydrotherapy deferred today - continue w/ wound care per gen surg...  4. abd pain. Constipation resolved.  Tol TF.  Cont same  5. Nutrition   Cont TFs  6. Dysphagia Plan -  Spoke to ST.  They will f/u.  May repeat FEES.  Likely pt ok to eat very modified diet with CLOSE supervision.  7. Hypokalemia:  K was 3.2 when last checked on 11/12;  We will f/u labs in AM...  Lonzo Cloud. Kriste Basque, MD 12/22/2010, 12:03 PM

## 2010-12-22 NOTE — Progress Notes (Signed)
PT - Hydrotherapy Cancellation Note  Treatment cancelled today due to I&D yesterday and dressing being changed this am by Dr. Lindie Spruce.   Per Dr. Lindie Spruce, no need for hydrotherapy today.   RN to change dressing on Sunday.   Will continue with hydrotherapy Monday.  Durenda Hurt Renaldo Fiddler, [facility] Pager [phone]

## 2010-12-23 DIAGNOSIS — J9620 Acute and chronic respiratory failure, unspecified whether with hypoxia or hypercapnia: Secondary | ICD-10-CM

## 2010-12-23 DIAGNOSIS — L89309 Pressure ulcer of unspecified buttock, unspecified stage: Secondary | ICD-10-CM

## 2010-12-23 DIAGNOSIS — Z9911 Dependence on respirator [ventilator] status: Secondary | ICD-10-CM

## 2010-12-23 DIAGNOSIS — E662 Morbid (severe) obesity with alveolar hypoventilation: Secondary | ICD-10-CM

## 2010-12-23 LAB — COMPREHENSIVE METABOLIC PANEL
ALT: 15 U/L (ref 0–53)
BUN: 8 mg/dL (ref 6–23)
CO2: 33 mEq/L — ABNORMAL HIGH (ref 19–32)
Calcium: 8.7 mg/dL (ref 8.4–10.5)
GFR calc Af Amer: >90 mL/min (ref >90)
GFR calc non Af Amer: >90 mL/min (ref >90)
Glucose, Bld: 137 mg/dL — ABNORMAL HIGH (ref 70–99)
Sodium: 139 mEq/L (ref 135–145)
Total Protein: 6.3 g/dL (ref 6.0–8.3)

## 2010-12-23 LAB — CBC
HCT: 23 % — ABNORMAL LOW (ref 39.0–52.0)
Hemoglobin: 7.6 g/dL — ABNORMAL LOW (ref 13.0–17.0)
MCH: 24.5 pg — ABNORMAL LOW (ref 26.0–34.0)
MCHC: 33 g/dL (ref 30.0–36.0)
MCV: 74.2 fL — ABNORMAL LOW (ref 78.0–100.0)
RBC: 3.1 MIL/uL — ABNORMAL LOW (ref 4.22–5.81)

## 2010-12-23 LAB — DIFFERENTIAL
Basophils Absolute: 0 10*3/uL (ref 0.0–0.1)
Eosinophils Absolute: 0.4 10*3/uL (ref 0.0–0.7)
Eosinophils Relative: 3 % (ref 0–5)
Lymphocytes Relative: 19 % (ref 12–46)
Neutrophils Relative %: 66 % (ref 43–77)

## 2010-12-23 LAB — GLUCOSE, CAPILLARY: Glucose-Capillary: 109 mg/dL — ABNORMAL HIGH (ref 70–99)

## 2010-12-23 LAB — PRO B NATRIURETIC PEPTIDE
Pro B Natriuretic peptide (BNP): 132.3 pg/mL — ABNORMAL HIGH (ref 0–125)
Pro B Natriuretic peptide (BNP): 209 pg/mL — ABNORMAL HIGH (ref 0–125)

## 2010-12-23 MED ORDER — ALTEPLASE 2 MG IJ SOLR
2.0000 mg | Freq: Once | INTRAMUSCULAR | Status: AC
  Administered 2010-12-23: 2 mg
  Filled 2010-12-23: qty 2

## 2010-12-23 MED ORDER — FENTANYL CITRATE 0.05 MG/ML IJ SOLN
100.0000 ug | INTRAMUSCULAR | Status: DC | PRN
  Administered 2010-12-23 – 2010-12-26 (×19): 100 ug via INTRAVENOUS
  Filled 2010-12-23 (×14): qty 2

## 2010-12-23 MED ORDER — LORAZEPAM 2 MG/ML PO CONC: 1.0000 mg | Freq: Every day | ORAL | Status: DC

## 2010-12-23 NOTE — Progress Notes (Signed)
Respiratory Therapy Note-SBT performed and Pt. Failed. Placed on +20 PS, tolerating well. Will cont. To wean PS as tolerated.

## 2010-12-23 NOTE — Progress Notes (Signed)
Brief 45 y/o AAM with Acute and Chronic Resp failure/ vent/ trach, OHS, severe sacral decubitus, monoclonal gammopathy, fever.  Pending culture data Sputum 11/13>>> abundant pseudomonas>>>pan sens PCT 11/13>>> 0.13  Studies/Results: 12/11/10 FEES ++ dysphagia 11/14 FEES>>>  Major Event: Right arm PICC lines became dysfunction - flip to IJ Left arm PICC line placed 12/13/10 >>>  Subjective S/P surgical debridement of sacral decub 11/16 by DrBlackman, CCS. Trache- remains on PSV of 20, forgets to breath on lower pressures. Indicates some pain & pain meds helping...  Objective: Vital signs in last 24 hours: Filed Vitals:   12/23/10 0500 12/23/10 0531 12/23/10 0600 12/23/10 0742  BP: 120/58 120/58    Pulse: 96 97 94   Temp:    99.1 F (37.3 C)  TempSrc:    Oral  Resp: 20  26   Height:      Weight: 177 kg (390 lb 3.4 oz)     SpO2: 96%  100%     Intake/Output Summary (Last 24 hours) at 12/23/10 1128 Last data filed at 12/23/10 0945  Gross per 24 hour  Intake   4580 ml  Output   8400 ml  Net  -3820 ml   Total I/O In: 720 [P.O.:720] Out: 400 [Urine:400]  Physical Exam: General:  Obese male in no acute distress on air bed for decubitus amangement HEENT:  Puffy tissue edema, short fat neck w/ trache; no JVD evident & no bruits. LUNGS:  resp's even/non-labored, lungs bilaterally coarse & decr BS... HEART:  RRR, no murmurs/rubs/gallops ABD:  Obesely distended, (+)BS, soft, nontender, no guarding Sacral decub checked by CCS... EXTR:  Intact, no cyanosis, + tissue edema bilaterally  Lab Results:  CXR:  11/17 >  IMPRESSION:  Stable postoperative changes and low lung volumes. No acute disease.  BS:  CBGs last 24H > 125 - 154          FBS today > 109  BMet:  Last 11/14 >   OK x K=3.0 and labs ordered for this AM weren't done... Reordered for this afternoon ==> CBC, CMet, BNP (pending)   Assessment/Plan:  1.  Acute on chronic respiratory failure, in setting of Obesity  hypoventilation syndrome. Further complicated by deconditioning, and abdominal pain.  Still requiring very high levels of pressure support.  Now with respiratory culture positive for pseudomonas (pan sens), continues to run low grade fevers (99+)   Plan:  - Continue with PSV wean as tolerated, but likely will need LTAC v vent SNF - aim for negative fluid balance> f/u K - expect best case scenario here will be trach (indef), with nocturnal vent.  - initiate cipro for pseudomonas  2. Fever:  This has been an ongoing issue. Off all antibiotics. Pct neg. Heme/Onc following.  Plan: - As above  3.  Sacral Wound Plan: per DrWyatt 11/17 >> Decubitus: Deep crater ulceration mostly to the left of the midline, necrotic at the base- packed an entire Kerlix roll in the wound and then covered it with a second Kerlix. Will need debridement again in the future. At some point may need diversion, but nurses state that stool contamination is not a problem now.  - hydrotherapy deferred today - continue w/ wound care per gen surg...  4. abd pain. Constipation resolved.  Tol TF. Cont same  5. Nutrition   Cont TFs  6. Dysphagia Plan - Likely pt ok to eat very modified diet with CLOSE supervision.  7. Hypokalemia:  K was 3.0 when last checked on  11/14;  We will f/u labs today...  Lonzo Cloud. Kriste Basque, MD 12/23/2010, 11:28 AM

## 2010-12-23 NOTE — Progress Notes (Signed)
2 Days Post-Op  Subjective: No complaints   Objective: Vital signs in last 24 hours: Temp:  [97.9 F (36.6 C)-100.6 F (38.1 C)] 99.1 F (37.3 C) (11/18 0742) Pulse Rate:  [90-108] 94  (11/18 0600) Resp:  [20-34] 26  (11/18 0600) BP: (77-127)/(43-82) 120/58 mmHg (11/18 0531) SpO2:  [96 %-100 %] 100 % (11/18 0600) FiO2 (%):  [29.9 %-40.5 %] 40 % (11/18 0809) Weight:  [390 lb 3.4 oz (177 kg)] 390 lb 3.4 oz (177 kg) (11/18 0500) Last BM Date: 12/22/10  Intake/Output from previous day: 11/17 0701 - 11/18 0700 In: 5530 [P.O.:3320; I.V.:800; NG/GT:1010; IV Piggyback:400] Out: 8800 [Urine:8800] Intake/Output this shift: Total I/O In: -  Out: 400 [Urine:400]  GI: soft, non-tender; bowel sounds normal; no masses,  no organomegaly  Lab Results:   Basename 12/21/10 0553  WBC 14.9*  HGB 7.6*  HCT 23.2*  PLT 238   BMET No results found for this basename: NA:2,K:2,CL:2,CO2:2,GLUCOSE:2,BUN:2,CREATININE:2,CALCIUM:2 in the last 72 hours PT/INR No results found for this basename: LABPROT:2,INR:2 in the last 72 hours ABG No results found for this basename: PHART:2,PCO2:2,PO2:2,HCO3:2 in the last 72 hours  Studies/Results: Dg Chest Port 1 View  12/22/2010  *RADIOLOGY REPORT*  Clinical Data: Respiratory failure  PORTABLE CHEST - 1 VIEW  Comparison: 12/15/2010  Findings: The tracheostomy and left arm PICC remain in place. Relatively low lung volumes as before.  Heart size upper limits normal.  No effusion. Central pulmonary vascular congestion.  No focal airspace consolidation.  Right lateral costophrenic angle is excluded.  IMPRESSION:  1.  Stable postoperative changes and low lung volumes.  No acute disease.  Original Report Authenticated By: Osa Craver, M.D.    Anti-infectives: Anti-infectives     Start     Dose/Rate Route Frequency Ordered Stop   12/21/10 1200   ciprofloxacin (CIPRO) IVPB 400 mg        400 mg 200 mL/hr over 60 Minutes Intravenous Every 12 hours  12/21/10 1151     12/14/10 1400   ceFAZolin (ANCEF) IVPB 1 g/50 mL premix  Status:  Discontinued        1 g 100 mL/hr over 30 Minutes Intravenous 3 times per day 12/14/10 1222 12/17/10 1415   12/14/10 0815   ceFAZolin (ANCEF) IVPB 1 g/50 mL premix        1 g 100 mL/hr over 30 Minutes Intravenous On call 12/14/10 0808 12/14/10 0952   12/14/10 0000   ceFAZolin (ANCEF) IVPB 1 g/50 mL premix  Status:  Discontinued     Comments: On call to Radiology      1 g 100 mL/hr over 30 Minutes Intravenous  Once 12/13/10 1452 12/13/10 1454          Assessment/Plan: s/p Procedure(s): DEBRIDEMENT WOUND Continue ABX therapy due to Post-op infection Will recheck sacral wound early this week Continue dressing changes  LOS: 37 days    TOTH III,Sharonann Malbrough S 12/23/2010

## 2010-12-24 LAB — GLUCOSE, CAPILLARY
Glucose-Capillary: 221 mg/dL — ABNORMAL HIGH (ref 70–99)
Glucose-Capillary: 117 mg/dL — ABNORMAL HIGH (ref 70–99)

## 2010-12-24 MED ORDER — POTASSIUM CHLORIDE 20 MEQ/15ML (10%) PO LIQD
40.0000 meq | Freq: Once | ORAL | Status: AC
  Administered 2010-12-24: 40 meq
  Filled 2010-12-24 (×2): qty 30

## 2010-12-24 MED ORDER — FUROSEMIDE 10 MG/ML IJ SOLN
40.0000 mg | Freq: Once | INTRAMUSCULAR | Status: AC
  Administered 2010-12-24: 40 mg via INTRAVENOUS
  Filled 2010-12-24: qty 4

## 2010-12-24 NOTE — Consult Note (Signed)
Pt being followed for surgical management of sacral decubitus now.  CCS ordering dressings at current and planning possible further surgical debridement, no plans for diverting ostomy at this time.  WOC will not follow for wound care at this time due to above.  However will monitor pt status should my services be needed again. Re consult if needed.   Thanks Cardarius Senat [employer], CWOCN [phone])

## 2010-12-24 NOTE — Plan of Care (Signed)
Problem: Problem: Skin/Wound Progression Goal: HEALING PRESSURE ULCER Outcome: Not Progressing OR for major debridement. Will require repeat debridement to move towards healing

## 2010-12-24 NOTE — Progress Notes (Signed)
Clinical Social Worker spoke with Tammy at [facility], [place],  who stated they were able to admit pt.   Tammy requested recent progress notes on pt to ensure no new information was noted re: plan of care.  Clinical Social Worker submitted requested paperwork to [facility].  Clinical Social Worker attempted to communicate this information with pt but he appeared asleep and did not awake to calling his name several times.  Clinical Social Worker will meet with pt again later today to discuss this information.   Angelia Mould, MSW, [place] [phone]

## 2010-12-24 NOTE — Progress Notes (Signed)
Speech Pathology: Dysphagia Treatment Note  Patient was observed with : Solids and Thin liquids.  On CPAP at 40% FiO2.  Patient self-fed 50% of trials.  Patient was noted to have s/s of aspiration : No  Lung Sounds:  Diminished and Rales per RN Temperature: Afebrile  Clinical Impression:  Demonstrates a normal oral-pharyngeal swallow mechanism at this time and no overt s/s of aspiration.  Although patient did not show any impulsive behavior in this session, SLP is aware of this behavior when self-feeding.   Recommendations:  1. Continue current diet   2. Full supervision at meals to provide necessary cues/education to decrease impulsive self-feeding behavior 3. D/C SLP services at this time due to no skilled needs. Thank you for this consult.  Pain:   none Intervention Required:   No  Goals: All Goals Met  Myra Rude, M.S.,CCC-SLP Pager [phone]

## 2010-12-24 NOTE — Progress Notes (Signed)
Physical Therapy Wound Treatment Patient Details  Name: [name] MRN: [id number] Date of Birth: 04/27/1965  Today's Date: 12/24/2010 [id number]  - 1025    Subjective     Pain Score: Pain Score:   9  Wound Assessment  Pressure Ulcer Unstageable - Full thickness tissue loss in which the base of the ulcer is covered by slough (yellow, tan, gray, green or brown) and/or eschar (tan, brown or black) in the wound bed. with dressing dry and intact (Active)  State of Healing Eschar 12/24/2010 10:30 AM  Site / Wound Assessment Bleeding;Black;Brown;Red 12/24/2010 10:30 AM  % Wound base Red or Granulating 0% 12/23/2010  8:45 PM  % Wound base Yellow 0% 12/23/2010  8:45 PM  % Wound base Black 50% 12/23/2010  8:45 PM  % Wound base Other (Comment) 50% 12/23/2010  8:45 PM  Peri-wound Assessment Intact;Edema 12/23/2010  8:45 PM  Wound Length (cm) 13 cm 12/19/2010  2:40 PM  Wound Width (cm) 31 cm 12/19/2010  2:40 PM  Wound Depth (cm) 5 cm 12/24/2010 10:30 AM  Tunneling (cm) yes 12/22/2010  8:00 AM  Margins Unattacted edges (unapproximated) 12/23/2010  8:45 PM  Drainage Amount Moderate 12/24/2010 10:30 AM  Drainage Description Purulent;Sanguineous 12/24/2010 10:30 AM  Treatment Hydrotherapy (Pulse lavage);Packing (Saline gauze) 12/24/2010 10:30 AM  Dressing Type Moist to dry;ABD;Gauze (Comment);Barrier Film (skin prep) 12/24/2010 10:30 AM  Dressing Changed 12/24/2010 10:30 AM                Wound Assessment and Plan  Wound Therapy - Assess/Plan/Recommendations Wound Therapy - Clinical Statement: Pt. continues with large ulcer.  Still with large amount of necrotic tissue.  Wound Therapy Goals- Improve the function of patient's integumentary system by progressing the wound(s) through the phases of wound healing (inflammation - proliferation - remodeling) by: Decrease Necrotic Tissue - Progress: Progressing toward goal Increase Granulation Tissue - Progress: Progressing toward  goal Patient/Family Instruction Goal - Progress: Progressing toward goal  Goals will be updated until maximal potential achieved or discharge criteria met.  Discharge criteria: when goals achieved, discharge from hospital, MD decision/surgical intervention, no progress towards goals, refusal/missing three consecutive treatments without notification or medical reason.  Zander Ingham 12/24/2010, 11:08 AM   Skip Mayer PT [phone]

## 2010-12-24 NOTE — Progress Notes (Signed)
Inpatient Diabetes Program Recommendations  AACE/ADA: New Consensus Statement on Inpatient Glycemic Control (2009)  Target Ranges:  Prepandial:   less than 140 mg/dL      Peak postprandial:   less than 180 mg/dL (1-2 hours)      Critically ill patients:  140 - 180 mg/dL    Inpatient Diabetes Program Recommendations Correction (SSI): Change to TID and hs coverage since patient now eating po diet

## 2010-12-24 NOTE — Progress Notes (Signed)
Clinical Social Worker attempted to speak with pt, but pt remains asleep.  CSW will speak with pt in the morning.  Angelia Mould, MSW, [place] [phone]

## 2010-12-24 NOTE — Plan of Care (Signed)
Problem: Discharge Progression Outcomes Goal: Tolerating diet Outcome: Progressing D/C SLP services; tolerating Regular diet.

## 2010-12-24 NOTE — Progress Notes (Signed)
PT/OT/SLP Cancellation Note  Treatment cancelled today due to medical issues with patient which prohibited therapy Treatment cancelled today due to patient receiving procedure or test   Treatment cancelled today due to patient's refusal to participate   Treatment cancelled today due to pt stated he had a bad night and couldn't participate today--But would tomorrow---x

## 2010-12-24 NOTE — Progress Notes (Signed)
Brief 45 y/o AAM with Acute and Chronic Resp failure/ vent/ trach, OHS, severe sacral decubitus, monoclonal gammopathy, fever.  Pending culture data Sputum 11/13>>> abundant pseudomonas>>>pan sens PCT 11/13>>> 0.13  Studies/Results: 12/11/10 FEES ++ dysphagia 11/14 FEES>>>  ABX 11/19 Cipro (sputum/pseud)>>>  Key Event: Right arm PICC lines became dysfunction - flip to IJ Left arm PICC line placed 12/13/10 >>>  Subjective RT reports pt able to start PSV wean on 20 and was able to decrease to 16 11/18, increased secretions  Objective: Vital signs in last 24 hours: Filed Vitals:   12/24/10 0600 12/24/10 0610 12/24/10 0804 12/24/10 0826  BP: 130/73 130/76 132/78 132/78  Pulse: 106 114 93 91  Temp:   98.9 F (37.2 C)   TempSrc:   Oral   Resp: 21  22   Height:      Weight:      SpO2: 99%  100% 99%    Intake/Output Summary (Last 24 hours) at 12/24/10 1023 Last data filed at 12/24/10 1610  Gross per 24 hour  Intake   4640 ml  Output   9650 ml  Net  -5010 ml   Total I/O In: -  Out: 1200 [Urine:1200]  Physical Exam: General:  Obese male in no acute distress on air bed for decubitus amangement HEENT:  Puffy tissue edema, short fat neck w/ trache; no JVD evident & no bruits. LUNGS:  resp's even/non-labored, lungs bilaterally coarse & decr BS... HEART:  RRR, no murmurs/rubs/gallops ABD:  Obesely distended, (+)BS, soft, nontender, no guarding Sacral decub checked by CCS... EXTR:  Intact, no cyanosis, + tissue edema bilaterally  Lab Results:  CXR:  11/19 >  No new films.  BS:  CBGs last 24H > 117-221  BMET    Component Value Date/Time   NA 139 12/23/2010 2242   K 3.4* 12/23/2010 2242   CL 96 12/23/2010 2242   CO2 33* 12/23/2010 2242   GLUCOSE 137* 12/23/2010 2242   BUN 8 12/23/2010 2242   CREATININE 0.86 12/23/2010 2242   CALCIUM 8.7 12/23/2010 2242   GFRNONAA >90 12/23/2010 2242   GFRAA >90 12/23/2010 2242       Assessment/Plan:  1.  Acute on chronic  respiratory failure, in setting of Obesity hypoventilation syndrome. Further complicated by deconditioning, and abdominal pain.  Still requiring very high levels of pressure support.  Now with respiratory culture positive for pseudomonas (pan sens), continues to run low grade fevers (99+)   Plan:  - Continue with PSV wean as tolerated, but likely will need LTAC v vent SNF - aim for negative fluid balance> f/u K - expect best case scenario here will be trach (indef), with nocturnal vent.  - cipro for pseudomonas - lasix x1 dose 11/19, f/u labs in am with cxr  2. Fever:  This has been an ongoing issue.  Pct neg. Heme/Onc following.  Plan: - As above  3.  Sacral Wound Plan: per DrWyatt 11/17 >> Decubitus: Deep crater ulceration mostly to the left of the midline, necrotic at the base- packed an entire Kerlix roll in the wound and then covered it with a second Kerlix. Will need debridement again in the future. At some point may need diversion, but nurses state that stool contamination is not a problem now.  - hydrotherapy deferred today - continue w/ wound care per gen surg...  4. abd pain. Constipation resolved.  PLAN: -Tol TF. Cont same  5. Nutrition   PLAN: -Cont TFs  6. Dysphagia Plan - Likely  pt ok to eat very modified diet with CLOSE supervision.  7. Hypokalemia:   PLAN: -f/u BMP intermittently -kcl with lasix dose 11/19   Canary Brim, NP-C [place] Pgr: [phone]  Examined , agree with above plan, discussed with daughter, dispo unclear for me [facility].

## 2010-12-24 NOTE — Progress Notes (Signed)
3 Days Post-Op  Subjective: Pt awake alert cooperative  Objective: Vital signs in last 24 hours: Temp:  [98.8 F (37.1 C)-100.1 F (37.8 C)] 98.9 F (37.2 C) (11/19 0804) Pulse Rate:  [91-116] 91  (11/19 0826) Resp:  [16-33] 22  (11/19 0804) BP: (105-139)/(51-78) 132/78 mmHg (11/19 0826) SpO2:  [94 %-100 %] 99 % (11/19 0826) FiO2 (%):  [29.8 %-40.4 %] 39.7 % (11/19 0830) Weight:  [404 lb 8.7 oz (183.5 kg)] 404 lb 8.7 oz (183.5 kg) (11/19 0500) Last BM Date: 12/24/10  Intake/Output from previous day: 11/18 0701 - 11/19 0700 In: 5510 [P.O.:4320; I.V.:330; NG/GT:180; IV Piggyback:200] Out: 8850 [Urine:8850] Intake/Output this shift: Total I/O In: -  Out: 1200 [Urine:1200]  Incision/Wound: Decubitus deep to Left midline.  Some clean tissue but still necrotic in places.  Lab Results:   [facility] 12/23/10 2242  WBC 13.2*  HGB 7.6*  HCT 23.0*  PLT 230   BMET  Basename 12/23/10 2242  NA 139  K 3.4*  CL 96  CO2 33*  GLUCOSE 137*  BUN 8  CREATININE 0.86  CALCIUM 8.7   PT/INR No results found for this basename: LABPROT:2,INR:2 in the last 72 hours ABG No results found for this basename: PHART:2,PCO2:2,PO2:2,HCO3:2 in the last 72 hours  Studies/Results: No results found.  Anti-infectives: Anti-infectives     Start     Dose/Rate Route Frequency Ordered Stop   12/21/10 1200   ciprofloxacin (CIPRO) IVPB 400 mg        400 mg 200 mL/hr over 60 Minutes Intravenous Every 12 hours 12/21/10 1151     12/14/10 1400   ceFAZolin (ANCEF) IVPB 1 g/50 mL premix  Status:  Discontinued        1 g 100 mL/hr over 30 Minutes Intravenous 3 times per day 12/14/10 1222 12/17/10 1415   12/14/10 0815   ceFAZolin (ANCEF) IVPB 1 g/50 mL premix        1 g 100 mL/hr over 30 Minutes Intravenous On call 12/14/10 0808 12/14/10 0952   12/14/10 0000   ceFAZolin (ANCEF) IVPB 1 g/50 mL premix  Status:  Discontinued     Comments: On call to Radiology      1 g 100 mL/hr over 30 Minutes  Intravenous  Once 12/13/10 1452 12/13/10 1454          Assessment/Plan: s/p Procedure(s): DEBRIDEMENT WOUND Continue wound care.  May need further debridement. Colostomy will not help this heal.     LOS: 38 days    [name] Eslick A. 12/24/2010

## 2010-12-25 ENCOUNTER — Inpatient Hospital Stay (HOSPITAL_COMMUNITY): Payer: Medicare Other

## 2010-12-25 ENCOUNTER — Encounter (HOSPITAL_COMMUNITY): Payer: Self-pay | Admitting: Surgery

## 2010-12-25 DIAGNOSIS — Z9911 Dependence on respirator [ventilator] status: Secondary | ICD-10-CM

## 2010-12-25 DIAGNOSIS — L89309 Pressure ulcer of unspecified buttock, unspecified stage: Secondary | ICD-10-CM

## 2010-12-25 DIAGNOSIS — E662 Morbid (severe) obesity with alveolar hypoventilation: Secondary | ICD-10-CM

## 2010-12-25 DIAGNOSIS — J9620 Acute and chronic respiratory failure, unspecified whether with hypoxia or hypercapnia: Secondary | ICD-10-CM

## 2010-12-25 LAB — CBC
HCT: 24.1 % — ABNORMAL LOW (ref 39.0–52.0)
Hemoglobin: 7.9 g/dL — ABNORMAL LOW (ref 13.0–17.0)
MCH: 24.4 pg — ABNORMAL LOW (ref 26.0–34.0)
MCHC: 32.8 g/dL (ref 30.0–36.0)
RDW: 17.3 % — ABNORMAL HIGH (ref 11.5–15.5)

## 2010-12-25 LAB — GLUCOSE, CAPILLARY
Glucose-Capillary: 98 mg/dL (ref 70–99)
Glucose-Capillary: 126 mg/dL — ABNORMAL HIGH (ref 70–99)

## 2010-12-25 LAB — BASIC METABOLIC PANEL
BUN: 10 mg/dL (ref 6–23)
Calcium: 8.7 mg/dL (ref 8.4–10.5)
GFR calc non Af Amer: >90 mL/min (ref >90)
Glucose, Bld: 102 mg/dL — ABNORMAL HIGH (ref 70–99)

## 2010-12-25 MED ORDER — METOPROLOL TARTRATE 25 MG PO TABS
25.0000 mg | ORAL_TABLET | Freq: Two times a day (BID) | ORAL | Status: DC
  Administered 2010-12-25 – 2010-12-26 (×2): 25 mg via ORAL
  Filled 2010-12-25 (×3): qty 1

## 2010-12-25 MED ORDER — FUROSEMIDE 10 MG/ML IJ SOLN
40.0000 mg | Freq: Every day | INTRAMUSCULAR | Status: DC
  Administered 2010-12-25 – 2010-12-26 (×2): 40 mg via INTRAVENOUS
  Filled 2010-12-25 (×2): qty 4

## 2010-12-25 MED ORDER — POTASSIUM CHLORIDE 20 MEQ PO PACK
20.0000 meq | PACK | Freq: Every day | ORAL | Status: DC
  Administered 2010-12-25: 20 meq via ORAL
  Filled 2010-12-25 (×2): qty 1

## 2010-12-25 MED ORDER — FUROSEMIDE 10 MG/ML IJ SOLN: 40.0000 mg | Freq: Every day | INTRAMUSCULAR | Status: DC

## 2010-12-25 MED ORDER — PRO-STAT SUGAR FREE PO LIQD: 30.0000 mL | ORAL | Status: DC

## 2010-12-25 MED ORDER — POTASSIUM CHLORIDE 20 MEQ PO PACK: 20.0000 meq | PACK | Freq: Every day | ORAL | Status: DC

## 2010-12-25 MED ORDER — METOPROLOL TARTRATE 25 MG PO TABS: 25.0000 mg | ORAL_TABLET | Freq: Two times a day (BID) | ORAL | Status: DC

## 2010-12-25 MED ORDER — CIPROFLOXACIN HCL 500 MG PO TABS
500.0000 mg | ORAL_TABLET | Freq: Two times a day (BID) | ORAL | Status: DC
  Administered 2010-12-25 – 2010-12-26 (×3): 500 mg via ORAL
  Filled 2010-12-25 (×5): qty 1

## 2010-12-25 MED ORDER — ALBUTEROL SULFATE (5 MG/ML) 0.5% IN NEBU: 2.5000 mg | INHALATION_SOLUTION | RESPIRATORY_TRACT | Status: DC | PRN

## 2010-12-25 MED ORDER — BIOTENE DRY MOUTH MT LIQD: 15.0000 mL | Freq: Four times a day (QID) | OROMUCOSAL | Status: DC

## 2010-12-25 MED ORDER — CIPROFLOXACIN HCL 500 MG PO TABS: 500.0000 mg | ORAL_TABLET | Freq: Two times a day (BID) | ORAL | Status: AC

## 2010-12-25 MED ORDER — INSULIN GLARGINE 100 UNIT/ML ~~LOC~~ SOLN: 15.0000 [IU] | Freq: Every day | SUBCUTANEOUS | Status: DC

## 2010-12-25 MED ORDER — SENNA 8.6 MG PO TABS: 2.0000 | ORAL_TABLET | Freq: Two times a day (BID) | ORAL | Status: DC

## 2010-12-25 MED ORDER — PANTOPRAZOLE SODIUM 40 MG PO TBEC: 40.0000 mg | DELAYED_RELEASE_TABLET | Freq: Every day | ORAL | Status: DC

## 2010-12-25 MED ORDER — PANTOPRAZOLE SODIUM 40 MG PO TBEC
40.0000 mg | DELAYED_RELEASE_TABLET | Freq: Every day | ORAL | Status: DC
  Administered 2010-12-25 – 2010-12-26 (×2): 40 mg via ORAL
  Filled 2010-12-25 (×2): qty 1

## 2010-12-25 MED ORDER — INSULIN ASPART 100 UNIT/ML ~~LOC~~ SOLN: 0.0000 [IU] | SUBCUTANEOUS | Status: DC

## 2010-12-25 MED ORDER — SENNA 8.6 MG PO TABS
2.0000 | ORAL_TABLET | Freq: Two times a day (BID) | ORAL | Status: DC
  Administered 2010-12-25 – 2010-12-26 (×3): 17.2 mg via ORAL
  Filled 2010-12-25 (×4): qty 2

## 2010-12-25 MED ORDER — NYSTATIN 100000 UNIT/ML MT SUSP: 15.0000 mL | Freq: Four times a day (QID) | OROMUCOSAL | Status: AC

## 2010-12-25 NOTE — Discharge Summary (Addendum)
Physician Discharge Summary  Patient ID: [name] MRN: [id number] DOB/AGE: 1965-06-27 45 y.o.  Admit date: 11/16/2010 Discharge date: 12/25/2010    Discharge Diagnoses:  Active Problems:  Acute and chronic respiratory failure  Obesity hypoventilation syndrome  Hypernatremia  Fever  Decubitus ulcer of sacral area  Monoclonal gammopathy    Brief Summary: [name] is a 45 y.o. y/o morbidly obese male, resident of skilled nursing facility prior to admission with a PMH of acute on chronic diastolic congestive heart failure, IgA monoclonal gammopathy, a history of angioedema (reaction to tramadol), hypertension, OSA on BiPAP, dyslipidemia, osteoarthritis, prior history of marijuana and cocaine abuse who was  admitted on 11/16/2010 with complaints of worsening scrotal swelling and increased pain.  At time of admission he indicated he fall one week prior and then symptoms began.  He was placed on antibiotics per the nursing facility and continued to have swelling and pain. He began to have difficulties with urination secondary to swelling.  He also had been placed on oxygen at the nursing facility for shortness of breath. Emergency department evaluation at that time demonstrated bilateral hydroceles right greater than left, skin thickening, edema, and hyperemia in the scrotum.  Out of concern for Fournier's gangrene surgical consult was obtained and was felt on exam was consistent with mild cellulitis.  He subsequently decompensated and required intubation with mechanical ventilation.  Secondary to size and difficulty weaning he underwent placement of tracheostomy on 11/30/2010 per Dr. Lazarus Salines.  He had prolonged ICU stay with complications of decubitus ulceration to the sacral area, prolonged mechanical ventilation, unexplained fevers.  He was evaluated by infectious disease for fever as well as Heme / ONC. It was felt his fevers were not related to IgA monoclonal gammopathy (he had no evidence  of endorgan dysfunction damage and no evidence of osseous metastasis) but large sacral decubitus ulcers.  He had extensive evaluation for fever.  ID stopped antibiotics out of concern that antibiotics are causing fever.  His blood pressure reigned stable and had no evidence of occult sepsis.  He underwent placement of PEG tube per interventional radiology on 12/14/2010.  He underwent surgical debridement of sacral decubitus on 12/21/2010.  At this time no further surgical debridement is felt needed however, is recommended to continue twice a day wet to dry dressings, continual reassessment of santyl use (has been on during this admission and was stopped post debridement), and hydrotherapy if the facility has capability.  At this time he is a progress in regards to weaning and currently is tolerating pressure support of 12-14/5 with 30% FiO2, nocturnal PRVC with a rate of 16, tidal volume of 500, PEEP of 5 and FiO2 of 30%.  Currently at time of dictation he is tolerating for the first time aerosolized trach collar of 30%.  Given morbid obesity and obstructive sleep apnea it is recommended that he continue with nocturnal support.     [facility]ospital [facility] by Discharge Summary 1. Acute on chronic respiratory failure in setting of Morbid Obesity, OSA. Given body habitus and hx OSA expect best case scenario will be indefinite trach, with nocturnal ventilation.  He should not be decannulated unless significant weight loss (>100lbs and able to provide self care independently).  At this time he is a progress in regards to weaning and currently is tolerating pressure support of 12-14/5 with 30% FiO2, nocturnal PRVC with a rate of 16, tidal volume of 500, PEEP of 5 and FiO2 of 30%.  Currently at time of dictation  he is tolerating for the first time aerosolized trach collar of 30%.  Given morbid obesity and obstructive sleep apnea it is recommended that he continue with nocturnal support.       2. Fever.  Throughout  hospitalization [name] had fevers of unclear etiology.  He was evaluated by infectious disease for fever as well as Heme / ONC. It was felt his fevers were not related to IgA monoclonal gammopathy (he had no evidence of endorgan dysfunction damage and no evidence of osseous metastasis).  He unfortunately developed large sacral decubitus ulcers.  He had extensive evaluation for fever. After extensive antibiotic coverage, ID stopped antibiotics out of concern that antibiotics were causing fever.  His blood pressure remained stable and had no evidence of occult sepsis. Fevers have leveled off.  If fever were to spike or remain persistently elevated, recommend dedicated CT of pelvis to evaluate sacral wound depth for tunneling.   3. Decubitus ulcer of sacral area.  He underwent surgical debridement of sacral decubitus on 12/21/2010.  At this time no further surgical debridement is felt needed however, is recommended to continue twice a day wet to dry dressings, continual reassessment of santyl use (has been on during this admission and was stopped post debridement), and hydrotherapy if the facility has capability. Decubitus: Deep crater ulceration mostly to the left of the midline, necrotic at the base- packed an entire Kerlix roll in the wound and then covered it with a second Kerlix.    4. Hx of IgA Monoclonal gammopathy  5. Dysphagia. He underwent placement of PEG tube per interventional radiology on 12/14/2010.  He was further evaluated by ST and cleared to eat very modified diet with VERY CLOSE SUPERVISION.    6. Scrotal cellulitis / abd pain / constipation. At time of admission he indicated he fall one week prior and then symptoms began.  He was placed on antibiotics per the nursing facility and continued to have swelling and pain. He began to have difficulties with urination secondary to swelling.  He also had been placed on oxygen at the nursing facility for shortness of breath. Emergency department  evaluation at that time demonstrated bilateral hydroceles right greater than left, skin thickening, edema, and hyperemia in the scrotum.  Out of concern for Fournier's gangrene surgical consult was obtained and was felt on exam was consistent with mild cellulitis at best.  CT abd/pelvis was evaluated and was negative.   7. Hyperglycemia- In setting of acute illness, patient was placed on SSI and recommended to continue post discharge.   8. Anemia -anemia of chronic disease.  Hgb has ranged from 7.5-8.9 during last month of admission.  Recommend transfusion only if < 7, active bleeding or new cardiac event (in which case would recommend transfusion if below 8 with active MI).  No evidence of acute bleeding at this time.     Consults:  Cardiology pulmonary/critical care ID general surgery  ABX 11/16 Cipro (for pseudomonas in sputum, pan sens)>>>for total of 10 days  Lines/tubes: Right arm PICC lines became dysfunction - flip to IJ  Left arm PICC line placed 12/13/10 >>>to remain in place at discharge   Microbiology/Sepsis markers: Sputum 11/13>>> abundant pseudomonas>>>pan sens  PCT 11/13>>> 0.13 11/2 HIV>>>neg 11/3 Cdiff PCR>>>neg  Key Events / STUDIES 12/11/10 FEES ++ dysphagia  11/14 FEES>>>Recommendations  Solid Consistency: Regular  Liquid Consistency: Thin  Liquid Administration via: Straw  Medication Administration: Whole meds with puree  Supervision: Full supervision/cueing for compensatory strategies  Compensations: Slow  rate;Small sips/bites;Multiple dry swallows after each bite/sip  Postural Changes and/or Swallow Maneuvers: Seated upright 90 degrees (As upright as possible in current bed.)  Oral Care Recommendations: Oral care QID;Staff/trained caregiver to provide oral care  Other Recommendations: Clarify dietary restrictions    Radiologic Data 11/1 CT Abd/Pelvis>>>Normal appendix. Negative for mass lesion. No acute abnormality. 11/1 CT Chest>>>Mild dependent  atelectasis in the lung bases. Negative for pneumonia or effusion. Heart size is enlarged. Negative for mass or adenopathy.   Lab Data CBC    Component Value Date/Time   WBC 14.3* 12/25/2010 0506   RBC 3.24* 12/25/2010 0506   HGB 7.9* 12/25/2010 0506   HCT 24.1* 12/25/2010 0506   PLT 268 12/25/2010 0506   MCV 74.4* 12/25/2010 0506   MCH 24.4* 12/25/2010 0506   MCHC 32.8 12/25/2010 0506   RDW 17.3* 12/25/2010 0506   LYMPHSABS 2.6 12/23/2010 2242   MONOABS 1.5* 12/23/2010 2242   EOSABS 0.4 12/23/2010 2242   BASOSABS 0.0 12/23/2010 2242    BMET    Component Value Date/Time   NA 136 12/25/2010 0506   K 3.5 12/25/2010 0506   CL 93* 12/25/2010 0506   CO2 38* 12/25/2010 0506   GLUCOSE 102* 12/25/2010 0506   BUN 10 12/25/2010 0506   CREATININE 0.83 12/25/2010 0506   CALCIUM 8.7 12/25/2010 0506   GFRNONAA >90 12/25/2010 0506   GFRAA >90 12/25/2010 0506      Discharge Exam: General: Chronically ill, morbidly obese, in no acute distress Neuro: AAOx4, moving all extremities, communicates appropriately CV: S1-S2 regular rhythm PULM: Lungs bilaterally diminished, respirations even and non-labored GI: Abdomen is obese, soft, PEG in place, active bowel sounds Extremities: Warm and dry, 1+ edema    Disposition: Skilled Nursing Facility   Current Discharge Medication List    CONTINUE these medications which have NOT CHANGED   Details  acetaminophen (TYLENOL) 500 MG tablet Take 1,000 mg by mouth 3 (three) times daily as needed. For pain      albuterol (PROVENTIL) (2.5 MG/3ML) 0.083% nebulizer solution Take 2.5 mg by nebulization every 3 (three) hours as needed.      amLODipine (NORVASC) 5 MG tablet Take 5 mg by mouth every morning.      ciprofloxacin (CIPRO) 500 MG tablet Take 500 mg by mouth 2 (two) times daily.      docusate sodium (COLACE) 100 MG capsule Take 100 mg by mouth 2 (two) times daily.      fluticasone (FLONASE) 50 MCG/ACT nasal spray Place 1 spray into  the nose daily.      guaiFENesin (MUCINEX) 600 MG 12 hr tablet Take 1,200 mg by mouth 2 (two) times daily.      HYDROcodone-acetaminophen (VICODIN) 5-500 MG per tablet Take 1 tablet by mouth every 4 (four) hours as needed. For pain     hydroxypropyl methylcellulose (ISOPTO TEARS) 2.5 % ophthalmic solution Apply 1 drop to eye every 4 (four) hours as needed. For eyes     loratadine (CLARITIN) 10 MG tablet Take 10 mg by mouth daily.      losartan (COZAAR) 50 MG tablet Take 50 mg by mouth daily.      magnesium hydroxide (MILK OF MAGNESIA) 400 MG/5ML suspension Take 30 mLs by mouth daily as needed. For constipation     potassium chloride SA (K-DUR,KLOR-CON) 20 MEQ tablet Take 20 mEq by mouth 2 (two) times daily.      simvastatin (ZOCOR) 20 MG tablet Take 20 mg by mouth at bedtime.  tiotropium (SPIRIVA) 18 MCG inhalation capsule Place 18 mcg into inhaler and inhale every morning.      torsemide (DEMADEX) 20 MG tablet Take 60 mg by mouth 2 (two) times daily.            Discharged Condition: [name] has met maximum benefit of inpatient care and is medically stable and cleared for discharge to [facility], [place].  Further recommendations regarding follow-up and care to be made by medical team at SNF.     Time spent on disposition: Greater than 60 minutes.    Signed: Canary Brim, NP-C [facility] Pgr: [phone]

## 2010-12-25 NOTE — Progress Notes (Signed)
4 Days Post-Op  Subjective: Pt with trach.  Objective: Vital signs in last 24 hours: Temp:  [98 F (36.7 C)-100 F (37.8 C)] 100 F (37.8 C) (11/20 0726) Pulse Rate:  [92-112] 92  (11/20 0700) Resp:  [16-24] 22  (11/20 0700) BP: (107-125)/(50-80) 107/60 mmHg (11/20 0700) SpO2:  [97 %-100 %] 98 % (11/20 0700) FiO2 (%):  [29.8 %-40.3 %] 30.2 % (11/20 0700) Weight:  [396 lb 13.3 oz (180 kg)] 396 lb 13.3 oz (180 kg) (11/20 0500) Last BM Date: 12/23/10 (Simultaneous filing. User may not have seen previous data.)  Intake/Output from previous day: 11/19 0701 - 11/20 0700 In: 3060 [P.O.:2470; I.V.:240; IV Piggyback:200] Out: 96045 [Urine:10050] Intake/Output this shift: Total I/O In: 1210 [P.O.:1200; I.V.:10] Out: -   Skin: Skin color, texture, turgor normal. No rashes or lesions or Decubitus with foul smell.  Wound appears relatively clean.  Some granulation noted.  No abscess.  Exudate green in color.  Lab Results:   [facility] 12/25/10 0506 12/23/10 2242  WBC 14.3* 13.2*  HGB 7.9* 7.6*  HCT 24.1* 23.0*  PLT 268 230   BMET  Basename 12/25/10 0506 12/23/10 2242  NA 136 139  K 3.5 3.4*  CL 93* 96  CO2 38* 33*  GLUCOSE 102* 137*  BUN 10 8  CREATININE 0.83 0.86  CALCIUM 8.7 8.7   PT/INR No results found for this basename: LABPROT:2,INR:2 in the last 72 hours ABG No results found for this basename: PHART:2,PCO2:2,PO2:2,HCO3:2 in the last 72 hours  Studies/Results: Dg Chest Port 1 View  12/25/2010  *RADIOLOGY REPORT*  Clinical Data: Shortness of breath, evaluate airspace disease  PORTABLE CHEST - 1 VIEW  Comparison: 12/22/2010; 12/13/2010  Findings: Examination is degraded secondary to patient body habitus and portable technique.  Grossly unchanged enlarged cardiac silhouette and mediastinal contours.  Stable position of support apparatus.  Evaluation of the pulmonary parenchyma is degraded by patient motion artifact however the pulmonary vascularity is less distinct on the  present examination.  Grossly unchanged bibasilar opacities.  No definite pleural effusion or pneumothorax. Unchanged bones.  IMPRESSION: Degraded examination with findings suggestive of mild pulmonary edema and basilar atelectasis.  Original Report Authenticated By: Waynard Reeds, M.D.    Anti-infectives: Anti-infectives     Start     Dose/Rate Route Frequency Ordered Stop   12/21/10 1200   ciprofloxacin (CIPRO) IVPB 400 mg        400 mg 200 mL/hr over 60 Minutes Intravenous Every 12 hours 12/21/10 1151     12/14/10 1400   ceFAZolin (ANCEF) IVPB 1 g/50 mL premix  Status:  Discontinued        1 g 100 mL/hr over 30 Minutes Intravenous 3 times per day 12/14/10 1222 12/17/10 1415   12/14/10 0815   ceFAZolin (ANCEF) IVPB 1 g/50 mL premix        1 g 100 mL/hr over 30 Minutes Intravenous On call 12/14/10 0808 12/14/10 0952   12/14/10 0000   ceFAZolin (ANCEF) IVPB 1 g/50 mL premix  Status:  Discontinued     Comments: On call to Radiology      1 g 100 mL/hr over 30 Minutes Intravenous  Once 12/13/10 1452 12/13/10 1454          Assessment/Plan: s/p Procedure(s): DEBRIDEMENT WOUND continue wound care.  Its trying to clean up.  No surgical debridement at this point.  LOS: 39 days    [name] A. 12/25/2010

## 2010-12-25 NOTE — Progress Notes (Signed)
Clinical Social Worker met with pt at bedside and provided emotional support.  Clinical Social Worker discussed plan, pt would like to accept bed offer at the [place] facility.  Pt would like to eventually return to [facility] SNF if he is able to rehabilitate.  Clinical Social Work to coordinate [employer] process.     Angelia Mould, MSW, [place] [phone]

## 2010-12-25 NOTE — Progress Notes (Signed)
Physical Therapy Wound Treatment Patient Details  Name: [name] MRN: [id number] Date of Birth: 01/04/1966  Today's Date: 12/25/2010 Time:1200  - 1246    Subjective  Subjective: pt. trached  Pain Score: Pain Score: 10-Worst pain ever  Wound Assessment  Pressure Ulcer Unstageable - Full thickness tissue loss in which the base of the ulcer is covered by slough (yellow, tan, gray, green or brown) and/or eschar (tan, brown or black) in the wound bed. with dressing dry and intact (Active)  State of Healing Eschar 12/25/2010 12:00 PM  Site / Wound Assessment Black;Red;Yellow 12/25/2010 12:00 PM  % Wound base Red or Granulating 25% 12/25/2010 12:43 PM  % Wound base Yellow 25% 12/25/2010 12:43 PM  % Wound base Black 50% 12/25/2010 12:43 PM  % Wound base Other (Comment) 50% 12/23/2010  8:45 PM  Peri-wound Assessment Intact 12/25/2010 12:43 PM  Wound Length (cm) 13 cm 12/19/2010  2:40 PM  Wound Width (cm) 31 cm 12/19/2010  2:40 PM  Wound Depth (cm) 5 cm 12/24/2010 10:30 AM  Tunneling (cm) yes 12/22/2010  8:00 AM  Margins Unattacted edges (unapproximated) 12/23/2010  8:45 PM  Drainage Amount Copious 12/25/2010 12:43 PM  Drainage Description Purulent;Odor 12/25/2010 12:43 PM  Treatment Hydrotherapy (Pulse lavage);Debridement (Selective);Packing (Saline gauze) 12/25/2010 12:43 PM  Dressing Type Moist to dry;Gauze (Comment);Barrier Film (skin prep);ABD 12/25/2010 12:43 PM  Dressing Changed 12/25/2010 12:43 PM                                                                                                                Hydrotherapy Pulsed lavage therapy - wound location: sacrum Pulsed Lavage with Suction (psi):  (4-12 psi) Pulsed Lavage with Suction - Normal Saline Used: 1000 mL Pulsed Lavage Tip: Tip with splash shield Selective Debridement Selective Debridement - Location: sacrum Selective Debridement - Tools Used: Scalpel;Forceps Selective Debridement -  Tissue Removed: black eschar, yellow necrotic fat   Wound Assessment and Plan  Wound Therapy - Assess/Plan/Recommendations Wound Therapy - Clinical Statement: Pt. continues with large ulcer.  Still with large amount of necrotic tissue.  Wound Therapy Goals- Improve the function of patient's integumentary system by progressing the wound(s) through the phases of wound healing (inflammation - proliferation - remodeling) by: Decrease Necrotic Tissue - Progress: Not met Increase Granulation Tissue - Progress: Not met  Goals will be updated until maximal potential achieved or discharge criteria met.  Discharge criteria: when goals achieved, discharge from hospital, MD decision/surgical intervention, no progress towards goals, refusal/missing three consecutive treatments without notification or medical reason.  [name] 12/25/2010, 12:51 PM [facility] [phone]

## 2010-12-25 NOTE — Progress Notes (Signed)
Brief 45 y/o AAM with Acute and Chronic Resp failure/ vent/ trach, OHS, severe sacral decubitus, monoclonal gammopathy, fever.  Pending culture data Sputum 11/13>>> abundant pseudomonas>>>pan sens PCT 11/13>>> 0.13  Studies/Results: 12/11/10 FEES ++ dysphagia 11/14 FEES>>>  ABX 11/19 Cipro (sputum/pseud)>>>  Key Event: Right arm PICC lines became dysfunction - flip to IJ Left arm PICC line placed 12/13/10 >>> 11/20 Decreased secretions, tolerating PS 5/5   Subjective 11/20 Decreased secretions, tolerating PS 5/5  Denies Cp, anxious about going to ATC  Objective: Vital signs in last 24 hours: Filed Vitals:   12/25/10 0608 12/25/10 0700 12/25/10 0726 12/25/10 0955  BP: 117/69 107/60    Pulse: 102 92    Temp:   100 F (37.8 C)   TempSrc:   Oral   Resp:  22    Height:      Weight:      SpO2:  98%  96%    Intake/Output Summary (Last 24 hours) at 12/25/10 1000 Last data filed at 12/25/10 0900  Gross per 24 hour  Intake   4310 ml  Output   8850 ml  Net  -4540 ml   Total I/O In: 1450 [P.O.:1440; I.V.:10] Out: -   Physical Exam: General:  Obese male in no acute distress on air bed for decubitus amangement HEENT:  Puffy tissue edema, short fat neck w/ trache; no JVD evident & no bruits. LUNGS:  resp's even/non-labored, lungs bilaterally coarse & decr BS... HEART:  RRR, no murmurs/rubs/gallops ABD:  Obesely distended, (+)BS, soft, nontender, no guarding Sacral decub checked by CCS... EXTR:  Intact, no cyanosis, + tissue edema bilaterally  Lab Results:  CXR:  11/19 >  No new films.  BS:  CBGs last 24H > 117-221  BMET    Component Value Date/Time   NA 136 12/25/2010 0506   K 3.5 12/25/2010 0506   CL 93* 12/25/2010 0506   CO2 38* 12/25/2010 0506   GLUCOSE 102* 12/25/2010 0506   BUN 10 12/25/2010 0506   CREATININE 0.83 12/25/2010 0506   CALCIUM 8.7 12/25/2010 0506   GFRNONAA >90 12/25/2010 0506   GFRAA >90 12/25/2010 0506       Assessment/Plan:  1.   Acute on chronic respiratory failure, in setting of Obesity hypoventilation syndrome. Further complicated by deconditioning, and abdominal pain.  Still requiring very high levels of pressure support.  Now with respiratory culture positive for pseudomonas (pan sens), continues to run low grade fevers (99+)   Plan:  - Progres to ATC as tolerated, likely will need LTAC v vent SNF - aim for negative fluid balance> f/u K - expect best case scenario here will be trach (indef), with/ without  nocturnal vent.  - cipro for pseudomonas - lasix x1 dose 11/19, f/u labs in am with cxr  2. Fever:  This has been an ongoing issue.  Pct neg. Heme/Onc following.  Plan: - As above  3.  Sacral Wound Plan:11/17 >> Decubitus: Deep crater ulceration mostly to the left of the midline, necrotic at the base- packed an entire Kerlix roll in the wound and then covered it with a second Kerlix.  At some point may need diversion, but nurses state that stool contamination is not a problem now.  - continue w/ wound care per gen surg...  4. abd pain. Constipation resolved.    5. Nutrition   PLAN: taking pO   6. Dysphagia Plan - Likely pt ok to eat very modified diet with CLOSE supervision.  7. Hypokalemia:  PLAN: -f/u BMP intermittently -kcl with lasix dose 11/19     dispo unclear - has been accepted to [facility].

## 2010-12-25 NOTE — Plan of Care (Signed)
Problem: Consults Goal: Nutrition Consult-if indicated Outcome: Progressing Pt has been taking PO now.  Problem: Phase I Progression Outcomes Goal: Discharge plan established Outcome: Progressing Plan to be discharge to long term facility.  Problem: Phase II Progression Outcomes Goal: O2 sats > equal to 90% on RA or at baseline Outcome: Progressing Pt is on mechanical ventilation, trach collar started this morning.

## 2010-12-26 ENCOUNTER — Encounter (HOSPITAL_COMMUNITY): Payer: Self-pay | Admitting: Surgery

## 2010-12-26 LAB — GLUCOSE, CAPILLARY
Glucose-Capillary: 105 mg/dL — ABNORMAL HIGH (ref 70–99)
Glucose-Capillary: 112 mg/dL — ABNORMAL HIGH (ref 70–99)
Glucose-Capillary: 104 mg/dL — ABNORMAL HIGH (ref 70–99)

## 2010-12-26 MED ORDER — COLLAGENASE 250 UNIT/GM EX OINT: TOPICAL_OINTMENT | Freq: Every day | CUTANEOUS | Status: AC

## 2010-12-26 MED ORDER — COLLAGENASE 250 UNIT/GM EX OINT
TOPICAL_OINTMENT | Freq: Every day | CUTANEOUS | Status: DC
  Administered 2010-12-26: 10:00:00 via TOPICAL
  Filled 2010-12-26: qty 30

## 2010-12-26 MED ORDER — POTASSIUM CHLORIDE CRYS ER 20 MEQ PO TBCR
20.0000 meq | EXTENDED_RELEASE_TABLET | Freq: Every day | ORAL | Status: DC
  Administered 2010-12-26: 20 meq via ORAL

## 2010-12-26 MED ORDER — POTASSIUM CHLORIDE CRYS ER 20 MEQ PO TBCR: 20.0000 meq | EXTENDED_RELEASE_TABLET | Freq: Every day | ORAL | Status: DC

## 2010-12-26 NOTE — Progress Notes (Signed)
Patient is discharged to [facility], [place], [place] via [place].  Patient is alert and oriented, on trach collar.  Patient was discharged with PICC line double lumen to his left upper arm, foley cath and  pegtube- clamped.  Report given to Audria Nine, RN of [facility].

## 2010-12-26 NOTE — Progress Notes (Signed)
The patient was seen, examined and PA note reviewed and data reviewed.  I agree with the plan of action. 

## 2010-12-26 NOTE — Progress Notes (Signed)
Patient ID: [name], male   DOB: 10/31/1965, 45 y.o.   MRN: [id number] 5 Days Post-Op  Subjective: Pt without complaints, on trach  Objective: Vital signs in last 24 hours: Temp:  [98.2 F (36.8 C)-101.7 F (38.7 C)] 98.2 F (36.8 C) (11/21 0812) Pulse Rate:  [99-118] 108  (11/21 0600) Resp:  [18-30] 18  (11/20 2000) BP: (107-136)/(52-70) 132/61 mmHg (11/21 0600) SpO2:  [95 %-100 %] 97 % (11/21 0600) FiO2 (%):  [29.5 %-30.2 %] 30.2 % (11/21 0600) Last BM Date: 12/24/10  Intake/Output from previous day: 11/20 0701 - 11/21 0700 In: 2770 [P.O.:2400; I.V.:370] Out: 29562 [Urine:15000] Intake/Output this shift: Total I/O In: 720 [P.O.:720] Out: 1200 [Urine:1200]  PE: Skin: wound with some necrotic fat, otherwise relatively clean.  No evidence of stool contamination.  Malodor present.  Lab Results:   [facility] 12/25/10 0506 12/23/10 2242  WBC 14.3* 13.2*  HGB 7.9* 7.6*  HCT 24.1* 23.0*  PLT 268 230   BMET  Basename 12/25/10 0506 12/23/10 2242  NA 136 139  K 3.5 3.4*  CL 93* 96  CO2 38* 33*  GLUCOSE 102* 137*  BUN 10 8  CREATININE 0.83 0.86  CALCIUM 8.7 8.7   PT/INR No results found for this basename: LABPROT:2,INR:2 in the last 72 hours   Studies/Results: Dg Chest Port 1 View  12/25/2010  *RADIOLOGY REPORT*  Clinical Data: Shortness of breath, evaluate airspace disease  PORTABLE CHEST - 1 VIEW  Comparison: 12/22/2010; 12/13/2010  Findings: Examination is degraded secondary to patient body habitus and portable technique.  Grossly unchanged enlarged cardiac silhouette and mediastinal contours.  Stable position of support apparatus.  Evaluation of the pulmonary parenchyma is degraded by patient motion artifact however the pulmonary vascularity is less distinct on the present examination.  Grossly unchanged bibasilar opacities.  No definite pleural effusion or pneumothorax. Unchanged bones.  IMPRESSION: Degraded examination with findings suggestive of mild pulmonary  edema and basilar atelectasis.  Original Report Authenticated By: Waynard Reeds, M.D.    Anti-infectives: Anti-infectives     Start     Dose/Rate Route Frequency Ordered Stop   12/25/10 1000   ciprofloxacin (CIPRO) tablet 500 mg        500 mg Oral 2 times daily 12/25/10 0952     12/25/10 0000   ciprofloxacin (CIPRO) 500 MG tablet        500 mg Oral 2 times daily 12/25/10 1230 01/04/11 2359   12/21/10 1200   ciprofloxacin (CIPRO) IVPB 400 mg  Status:  Discontinued        400 mg 200 mL/hr over 60 Minutes Intravenous Every 12 hours 12/21/10 1151 12/25/10 0952   12/14/10 1400   ceFAZolin (ANCEF) IVPB 1 g/50 mL premix  Status:  Discontinued        1 g 100 mL/hr over 30 Minutes Intravenous 3 times per day 12/14/10 1222 12/17/10 1415   12/14/10 0815   ceFAZolin (ANCEF) IVPB 1 g/50 mL premix        1 g 100 mL/hr over 30 Minutes Intravenous On call 12/14/10 0808 12/14/10 0952   12/14/10 0000   ceFAZolin (ANCEF) IVPB 1 g/50 mL premix  Status:  Discontinued     Comments: On call to Radiology      1 g 100 mL/hr over 30 Minutes Intravenous  Once 12/13/10 1452 12/13/10 1454           Assessment/Plan  1. Sacral decub, s/p debridement  Plan: Will reinstate the santyl use  daily to wound to help debride the necrotic fat that is present.  Otherwise cont NS WD dressing changes BID Ok for d/c to SNF in [place] from our standpoint.   LOS: 40 days    Efraim Vanallen E 12/26/2010

## 2010-12-26 NOTE — Plan of Care (Signed)
Problem: Discharge Progression Outcomes Goal: Flu vaccine received if indicated Outcome: Completed/Met Date Met:  12/26/10 Patient is already discharge. Goal: Pneumonia vaccine received if indicated Outcome: Completed/Met Date Met:  12/26/10 Patient is discharge to [facility] in [place], [place]. Goal: Barriers To Progression Addressed/Resolved Outcome: Progressing Patient's pressure sore is not healing well.  PT has been doing the treatment.  Patient is transferred to [facility], [place], [place]. Goal: Pain controlled with appropriate interventions Outcome: Completed/Met Date Met:  12/26/10 Advised to reposition his self and patient is compliant.  Problem: Consults Goal: Ventilated Patients Patient Education See Patient Education Module for education specifics.  Outcome: Completed/Met Date Met:  12/26/10 Patient is on trach collar prior to discharge.  Problem: Phase III Progression Outcomes Goal: Barriers To Progression Addressed/Resolved Outcome: Progressing Pressure ulcer will be continued at [facility], [place], [place].  Problem: Problem: Respiratory Progression Goal: ABLE TO WEAN TO ROOM AIR Outcome: Progressing Pt is on trach collar prior to transfer. Goal: ABLE TO AMBULATE ON ROOM AIR Outcome: Progressing Maximum assist required for patient to ambulate.  Problem: Problem: Skin/Wound Progression Goal: HEALING PRESSURE ULCER Outcome: Not Progressing Patient's pressure sore is has a foul-smell, some necrotic tissues, and yellow sloughing tissues.  Hydrotherapy is done by PT.

## 2010-12-26 NOTE — Progress Notes (Signed)
Physical Therapy Wound Treatment Patient Details  Name: ANGELINO RUMERY MRN: [id number] Date of Birth: 11-15-1965  Today's Date: 12/26/2010 Time:10:08  - 10:39    Subjective  Subjective: Requesting water  Pain Score: Pain Score: 4/10 (faces) with hydrotherapy  Wound Assessment  Pressure Ulcer Unstageable - Full thickness tissue loss in which the base of the ulcer is covered by slough (yellow, tan, gray, green or brown) and/or eschar (tan, brown or black) in the wound bed. with dressing dry and intact (Active)  State of Healing Eschar 12/26/2010  3:15 PM  Site / Wound Assessment Bleeding;Black;Red 12/26/2010  3:15 PM  % Wound base Red or Granulating 25% 12/26/2010  3:15 PM  % Wound base Yellow 25% 12/26/2010  3:15 PM  % Wound base Black 50% 12/26/2010  3:15 PM  Peri-wound Assessment Intact 12/26/2010  3:15 PM  Margins Unattacted edges (unapproximated) 12/23/2010  8:45 PM  Drainage Amount Copious 12/26/2010  3:15 PM  Drainage Description Purulent;Odor;Green 12/26/2010  3:15 PM  Treatment Hydrotherapy (Pulse lavage);Other (Comment) 12/26/2010  9:00 AM  Dressing Type ABD;Barrier Film (skin prep);Moist to dry;Gauze (Comment) 12/26/2010  3:15 PM  Dressing Changed 12/26/2010  3:15 PM     Treatment Cleansed 12/15/2010 10:00 AM  Dressing Type ABD 12/25/2010  7:00 AM  Dressing Reinforced 12/25/2010  7:00 AM      Hydrotherapy Pulsed lavage therapy - wound location: sacrum/buttocks Pulsed Lavage with Suction (psi):  (4-12 psi) Pulsed Lavage with Suction - Normal Saline Used: 1000 mL Pulsed Lavage Tip: Tip with splash shield Selective Debridement Selective Debridement - Location: sacrum Selective Debridement - Tools Used: Scalpel;Scissors Selective Debridement - Tissue Removed: black eschar, yellow necrotic tissue   Wound Assessment and Plan  Wound Therapy - Assess/Plan/Recommendations Wound Therapy - Clinical Statement: One large wound with >50% necrotic tissue and foul odor.  Wound  Therapy Goals- Improve the function of patient's integumentary system by progressing the wound(s) through the phases of wound healing (inflammation - proliferation - remodeling) by:    Goals will be updated until maximal potential achieved or discharge criteria met.  Discharge criteria: when goals achieved, discharge from hospital, MD decision/surgical intervention, no progress towards goals, refusal/missing three consecutive treatments without notification or medical reason.  Vena Austria [phone] 12/26/2010, 3:29 PM

## 2010-12-26 NOTE — Progress Notes (Signed)
Clinical Social Worked submitted necessary documentation to receiving facility.  CSW arranged transport of pt to facility in [place]. CSW will sign off at dc.     Angelia Mould, MSW, [place] [phone]

## 2011-02-05 DIAGNOSIS — D72829 Elevated white blood cell count, unspecified: Secondary | ICD-10-CM | POA: Diagnosis not present

## 2011-02-06 DIAGNOSIS — J9620 Acute and chronic respiratory failure, unspecified whether with hypoxia or hypercapnia: Secondary | ICD-10-CM | POA: Diagnosis not present

## 2011-02-06 DIAGNOSIS — J9600 Acute respiratory failure, unspecified whether with hypoxia or hypercapnia: Secondary | ICD-10-CM | POA: Diagnosis not present

## 2011-02-06 DIAGNOSIS — R109 Unspecified abdominal pain: Secondary | ICD-10-CM | POA: Diagnosis not present

## 2011-02-06 DIAGNOSIS — I10 Essential (primary) hypertension: Secondary | ICD-10-CM | POA: Diagnosis not present

## 2011-02-11 DIAGNOSIS — R109 Unspecified abdominal pain: Secondary | ICD-10-CM | POA: Diagnosis not present

## 2011-02-11 DIAGNOSIS — I10 Essential (primary) hypertension: Secondary | ICD-10-CM | POA: Diagnosis not present

## 2011-02-11 DIAGNOSIS — J9600 Acute respiratory failure, unspecified whether with hypoxia or hypercapnia: Secondary | ICD-10-CM | POA: Diagnosis not present

## 2011-02-13 DIAGNOSIS — J9620 Acute and chronic respiratory failure, unspecified whether with hypoxia or hypercapnia: Secondary | ICD-10-CM | POA: Diagnosis not present

## 2011-02-17 DIAGNOSIS — J9610 Chronic respiratory failure, unspecified whether with hypoxia or hypercapnia: Secondary | ICD-10-CM | POA: Diagnosis not present

## 2011-02-20 DIAGNOSIS — J9620 Acute and chronic respiratory failure, unspecified whether with hypoxia or hypercapnia: Secondary | ICD-10-CM | POA: Diagnosis not present

## 2011-02-21 DIAGNOSIS — J9600 Acute respiratory failure, unspecified whether with hypoxia or hypercapnia: Secondary | ICD-10-CM | POA: Diagnosis not present

## 2011-02-21 DIAGNOSIS — M549 Dorsalgia, unspecified: Secondary | ICD-10-CM | POA: Diagnosis not present

## 2011-02-21 DIAGNOSIS — I10 Essential (primary) hypertension: Secondary | ICD-10-CM | POA: Diagnosis not present

## 2011-02-27 DIAGNOSIS — J9600 Acute respiratory failure, unspecified whether with hypoxia or hypercapnia: Secondary | ICD-10-CM | POA: Diagnosis not present

## 2011-02-27 DIAGNOSIS — M549 Dorsalgia, unspecified: Secondary | ICD-10-CM | POA: Diagnosis not present

## 2011-02-27 DIAGNOSIS — I10 Essential (primary) hypertension: Secondary | ICD-10-CM | POA: Diagnosis not present

## 2011-03-04 DIAGNOSIS — M549 Dorsalgia, unspecified: Secondary | ICD-10-CM | POA: Diagnosis not present

## 2011-03-04 DIAGNOSIS — J9600 Acute respiratory failure, unspecified whether with hypoxia or hypercapnia: Secondary | ICD-10-CM | POA: Diagnosis not present

## 2011-03-15 DIAGNOSIS — I509 Heart failure, unspecified: Secondary | ICD-10-CM | POA: Diagnosis not present

## 2011-03-15 DIAGNOSIS — J9600 Acute respiratory failure, unspecified whether with hypoxia or hypercapnia: Secondary | ICD-10-CM | POA: Diagnosis not present

## 2011-03-15 DIAGNOSIS — I10 Essential (primary) hypertension: Secondary | ICD-10-CM | POA: Diagnosis not present

## 2011-03-20 DIAGNOSIS — M549 Dorsalgia, unspecified: Secondary | ICD-10-CM | POA: Diagnosis not present

## 2011-03-20 DIAGNOSIS — J9600 Acute respiratory failure, unspecified whether with hypoxia or hypercapnia: Secondary | ICD-10-CM | POA: Diagnosis not present

## 2011-03-27 DIAGNOSIS — I10 Essential (primary) hypertension: Secondary | ICD-10-CM | POA: Diagnosis not present

## 2011-03-27 DIAGNOSIS — J9600 Acute respiratory failure, unspecified whether with hypoxia or hypercapnia: Secondary | ICD-10-CM | POA: Diagnosis not present

## 2011-03-27 DIAGNOSIS — I509 Heart failure, unspecified: Secondary | ICD-10-CM | POA: Diagnosis not present

## 2011-04-03 DIAGNOSIS — E119 Type 2 diabetes mellitus without complications: Secondary | ICD-10-CM | POA: Diagnosis not present

## 2011-04-03 DIAGNOSIS — J9600 Acute respiratory failure, unspecified whether with hypoxia or hypercapnia: Secondary | ICD-10-CM | POA: Diagnosis not present

## 2011-04-09 DIAGNOSIS — D649 Anemia, unspecified: Secondary | ICD-10-CM | POA: Diagnosis not present

## 2011-04-10 DIAGNOSIS — I10 Essential (primary) hypertension: Secondary | ICD-10-CM | POA: Diagnosis not present

## 2011-04-10 DIAGNOSIS — L8990 Pressure ulcer of unspecified site, unspecified stage: Secondary | ICD-10-CM | POA: Diagnosis not present

## 2011-04-10 DIAGNOSIS — I509 Heart failure, unspecified: Secondary | ICD-10-CM | POA: Diagnosis not present

## 2011-04-10 DIAGNOSIS — J9600 Acute respiratory failure, unspecified whether with hypoxia or hypercapnia: Secondary | ICD-10-CM | POA: Diagnosis not present

## 2011-04-11 DIAGNOSIS — J9610 Chronic respiratory failure, unspecified whether with hypoxia or hypercapnia: Secondary | ICD-10-CM | POA: Diagnosis not present

## 2011-04-15 DIAGNOSIS — R093 Abnormal sputum: Secondary | ICD-10-CM | POA: Diagnosis not present

## 2011-04-17 DIAGNOSIS — H2510 Age-related nuclear cataract, unspecified eye: Secondary | ICD-10-CM | POA: Diagnosis not present

## 2011-04-22 DIAGNOSIS — I509 Heart failure, unspecified: Secondary | ICD-10-CM | POA: Diagnosis not present

## 2011-04-22 DIAGNOSIS — J9600 Acute respiratory failure, unspecified whether with hypoxia or hypercapnia: Secondary | ICD-10-CM | POA: Diagnosis not present

## 2011-04-22 DIAGNOSIS — I10 Essential (primary) hypertension: Secondary | ICD-10-CM | POA: Diagnosis not present

## 2011-05-01 DIAGNOSIS — F602 Antisocial personality disorder: Secondary | ICD-10-CM | POA: Diagnosis not present

## 2011-05-01 DIAGNOSIS — D649 Anemia, unspecified: Secondary | ICD-10-CM | POA: Diagnosis not present

## 2011-05-01 DIAGNOSIS — R0989 Other specified symptoms and signs involving the circulatory and respiratory systems: Secondary | ICD-10-CM | POA: Diagnosis not present

## 2011-05-01 DIAGNOSIS — I10 Essential (primary) hypertension: Secondary | ICD-10-CM | POA: Diagnosis not present

## 2011-05-01 DIAGNOSIS — R5381 Other malaise: Secondary | ICD-10-CM | POA: Diagnosis not present

## 2011-05-01 DIAGNOSIS — J9610 Chronic respiratory failure, unspecified whether with hypoxia or hypercapnia: Secondary | ICD-10-CM | POA: Diagnosis not present

## 2011-05-01 DIAGNOSIS — R0602 Shortness of breath: Secondary | ICD-10-CM | POA: Diagnosis not present

## 2011-05-01 DIAGNOSIS — D72829 Elevated white blood cell count, unspecified: Secondary | ICD-10-CM | POA: Diagnosis not present

## 2011-05-01 DIAGNOSIS — G4733 Obstructive sleep apnea (adult) (pediatric): Secondary | ICD-10-CM | POA: Diagnosis not present

## 2011-05-01 DIAGNOSIS — L8993 Pressure ulcer of unspecified site, stage 3: Secondary | ICD-10-CM | POA: Diagnosis not present

## 2011-05-01 DIAGNOSIS — Z930 Tracheostomy status: Secondary | ICD-10-CM | POA: Diagnosis not present

## 2011-05-01 DIAGNOSIS — E669 Obesity, unspecified: Secondary | ICD-10-CM | POA: Diagnosis not present

## 2011-05-01 DIAGNOSIS — Z6841 Body Mass Index (BMI) 40.0 and over, adult: Secondary | ICD-10-CM | POA: Diagnosis not present

## 2011-05-01 DIAGNOSIS — L89109 Pressure ulcer of unspecified part of back, unspecified stage: Secondary | ICD-10-CM | POA: Diagnosis not present

## 2011-05-01 DIAGNOSIS — R5383 Other fatigue: Secondary | ICD-10-CM | POA: Diagnosis not present

## 2011-05-02 DIAGNOSIS — Z7401 Bed confinement status: Secondary | ICD-10-CM | POA: Diagnosis not present

## 2011-05-02 DIAGNOSIS — L89109 Pressure ulcer of unspecified part of back, unspecified stage: Secondary | ICD-10-CM | POA: Diagnosis not present

## 2011-05-02 DIAGNOSIS — D72829 Elevated white blood cell count, unspecified: Secondary | ICD-10-CM | POA: Diagnosis not present

## 2011-05-02 DIAGNOSIS — D649 Anemia, unspecified: Secondary | ICD-10-CM | POA: Diagnosis not present

## 2011-05-03 DIAGNOSIS — D72829 Elevated white blood cell count, unspecified: Secondary | ICD-10-CM | POA: Diagnosis not present

## 2011-05-03 DIAGNOSIS — L89109 Pressure ulcer of unspecified part of back, unspecified stage: Secondary | ICD-10-CM | POA: Diagnosis not present

## 2011-05-03 DIAGNOSIS — D649 Anemia, unspecified: Secondary | ICD-10-CM | POA: Diagnosis not present

## 2011-05-04 DIAGNOSIS — J9610 Chronic respiratory failure, unspecified whether with hypoxia or hypercapnia: Secondary | ICD-10-CM | POA: Diagnosis not present

## 2011-05-04 DIAGNOSIS — D649 Anemia, unspecified: Secondary | ICD-10-CM | POA: Diagnosis not present

## 2011-05-04 DIAGNOSIS — L89109 Pressure ulcer of unspecified part of back, unspecified stage: Secondary | ICD-10-CM | POA: Diagnosis not present

## 2011-05-04 DIAGNOSIS — D72829 Elevated white blood cell count, unspecified: Secondary | ICD-10-CM | POA: Diagnosis not present

## 2011-05-05 DIAGNOSIS — L89109 Pressure ulcer of unspecified part of back, unspecified stage: Secondary | ICD-10-CM | POA: Diagnosis not present

## 2011-05-05 DIAGNOSIS — J9610 Chronic respiratory failure, unspecified whether with hypoxia or hypercapnia: Secondary | ICD-10-CM | POA: Diagnosis not present

## 2011-05-05 DIAGNOSIS — D649 Anemia, unspecified: Secondary | ICD-10-CM | POA: Diagnosis not present

## 2011-05-05 DIAGNOSIS — D72829 Elevated white blood cell count, unspecified: Secondary | ICD-10-CM | POA: Diagnosis not present

## 2011-05-15 DIAGNOSIS — Z6841 Body Mass Index (BMI) 40.0 and over, adult: Secondary | ICD-10-CM | POA: Diagnosis not present

## 2011-05-15 DIAGNOSIS — Z930 Tracheostomy status: Secondary | ICD-10-CM | POA: Diagnosis not present

## 2011-05-15 DIAGNOSIS — J9610 Chronic respiratory failure, unspecified whether with hypoxia or hypercapnia: Secondary | ICD-10-CM | POA: Diagnosis not present

## 2011-05-16 DIAGNOSIS — Z930 Tracheostomy status: Secondary | ICD-10-CM | POA: Diagnosis not present

## 2011-05-16 DIAGNOSIS — Z6841 Body Mass Index (BMI) 40.0 and over, adult: Secondary | ICD-10-CM | POA: Diagnosis not present

## 2011-05-16 DIAGNOSIS — J9610 Chronic respiratory failure, unspecified whether with hypoxia or hypercapnia: Secondary | ICD-10-CM | POA: Diagnosis not present

## 2011-05-17 DIAGNOSIS — J9610 Chronic respiratory failure, unspecified whether with hypoxia or hypercapnia: Secondary | ICD-10-CM | POA: Diagnosis not present

## 2011-05-17 DIAGNOSIS — Z930 Tracheostomy status: Secondary | ICD-10-CM | POA: Diagnosis not present

## 2011-05-17 DIAGNOSIS — Z6841 Body Mass Index (BMI) 40.0 and over, adult: Secondary | ICD-10-CM | POA: Diagnosis not present

## 2011-05-20 DIAGNOSIS — J9610 Chronic respiratory failure, unspecified whether with hypoxia or hypercapnia: Secondary | ICD-10-CM | POA: Diagnosis not present

## 2011-05-20 DIAGNOSIS — D649 Anemia, unspecified: Secondary | ICD-10-CM | POA: Diagnosis not present

## 2011-05-20 DIAGNOSIS — L89109 Pressure ulcer of unspecified part of back, unspecified stage: Secondary | ICD-10-CM | POA: Diagnosis not present

## 2011-05-20 DIAGNOSIS — Z930 Tracheostomy status: Secondary | ICD-10-CM | POA: Diagnosis not present

## 2011-05-20 DIAGNOSIS — Z6841 Body Mass Index (BMI) 40.0 and over, adult: Secondary | ICD-10-CM | POA: Diagnosis not present

## 2011-05-21 DIAGNOSIS — L89109 Pressure ulcer of unspecified part of back, unspecified stage: Secondary | ICD-10-CM | POA: Diagnosis not present

## 2011-05-21 DIAGNOSIS — Z930 Tracheostomy status: Secondary | ICD-10-CM | POA: Diagnosis not present

## 2011-05-21 DIAGNOSIS — Z6841 Body Mass Index (BMI) 40.0 and over, adult: Secondary | ICD-10-CM | POA: Diagnosis not present

## 2011-05-21 DIAGNOSIS — J9610 Chronic respiratory failure, unspecified whether with hypoxia or hypercapnia: Secondary | ICD-10-CM | POA: Diagnosis not present

## 2011-05-21 DIAGNOSIS — D649 Anemia, unspecified: Secondary | ICD-10-CM | POA: Diagnosis not present

## 2011-05-22 DIAGNOSIS — L89109 Pressure ulcer of unspecified part of back, unspecified stage: Secondary | ICD-10-CM | POA: Diagnosis not present

## 2011-05-22 DIAGNOSIS — D649 Anemia, unspecified: Secondary | ICD-10-CM | POA: Diagnosis not present

## 2011-05-22 DIAGNOSIS — Z930 Tracheostomy status: Secondary | ICD-10-CM | POA: Diagnosis not present

## 2011-05-22 DIAGNOSIS — J9610 Chronic respiratory failure, unspecified whether with hypoxia or hypercapnia: Secondary | ICD-10-CM | POA: Diagnosis not present

## 2011-05-23 DIAGNOSIS — Z930 Tracheostomy status: Secondary | ICD-10-CM | POA: Diagnosis not present

## 2011-05-23 DIAGNOSIS — L89109 Pressure ulcer of unspecified part of back, unspecified stage: Secondary | ICD-10-CM | POA: Diagnosis not present

## 2011-05-23 DIAGNOSIS — D649 Anemia, unspecified: Secondary | ICD-10-CM | POA: Diagnosis not present

## 2011-05-23 DIAGNOSIS — J9610 Chronic respiratory failure, unspecified whether with hypoxia or hypercapnia: Secondary | ICD-10-CM | POA: Diagnosis not present

## 2011-05-24 DIAGNOSIS — D649 Anemia, unspecified: Secondary | ICD-10-CM | POA: Diagnosis not present

## 2011-05-24 DIAGNOSIS — J9610 Chronic respiratory failure, unspecified whether with hypoxia or hypercapnia: Secondary | ICD-10-CM | POA: Diagnosis not present

## 2011-05-24 DIAGNOSIS — L89109 Pressure ulcer of unspecified part of back, unspecified stage: Secondary | ICD-10-CM | POA: Diagnosis not present

## 2011-05-24 DIAGNOSIS — Z930 Tracheostomy status: Secondary | ICD-10-CM | POA: Diagnosis not present

## 2011-05-25 DIAGNOSIS — L89109 Pressure ulcer of unspecified part of back, unspecified stage: Secondary | ICD-10-CM | POA: Diagnosis not present

## 2011-05-25 DIAGNOSIS — Z930 Tracheostomy status: Secondary | ICD-10-CM | POA: Diagnosis not present

## 2011-05-25 DIAGNOSIS — D649 Anemia, unspecified: Secondary | ICD-10-CM | POA: Diagnosis not present

## 2011-05-25 DIAGNOSIS — J9610 Chronic respiratory failure, unspecified whether with hypoxia or hypercapnia: Secondary | ICD-10-CM | POA: Diagnosis not present

## 2011-05-26 DIAGNOSIS — J9610 Chronic respiratory failure, unspecified whether with hypoxia or hypercapnia: Secondary | ICD-10-CM | POA: Diagnosis not present

## 2011-05-26 DIAGNOSIS — L89109 Pressure ulcer of unspecified part of back, unspecified stage: Secondary | ICD-10-CM | POA: Diagnosis not present

## 2011-05-26 DIAGNOSIS — Z930 Tracheostomy status: Secondary | ICD-10-CM | POA: Diagnosis not present

## 2011-05-26 DIAGNOSIS — D649 Anemia, unspecified: Secondary | ICD-10-CM | POA: Diagnosis not present

## 2011-05-28 DIAGNOSIS — Z930 Tracheostomy status: Secondary | ICD-10-CM | POA: Diagnosis not present

## 2011-05-28 DIAGNOSIS — L89109 Pressure ulcer of unspecified part of back, unspecified stage: Secondary | ICD-10-CM | POA: Diagnosis not present

## 2011-05-28 DIAGNOSIS — J9610 Chronic respiratory failure, unspecified whether with hypoxia or hypercapnia: Secondary | ICD-10-CM | POA: Diagnosis not present

## 2011-05-28 DIAGNOSIS — D649 Anemia, unspecified: Secondary | ICD-10-CM | POA: Diagnosis not present

## 2011-05-31 DIAGNOSIS — L89109 Pressure ulcer of unspecified part of back, unspecified stage: Secondary | ICD-10-CM | POA: Diagnosis not present

## 2011-05-31 DIAGNOSIS — D649 Anemia, unspecified: Secondary | ICD-10-CM | POA: Diagnosis not present

## 2011-05-31 DIAGNOSIS — Z930 Tracheostomy status: Secondary | ICD-10-CM | POA: Diagnosis not present

## 2011-05-31 DIAGNOSIS — J9610 Chronic respiratory failure, unspecified whether with hypoxia or hypercapnia: Secondary | ICD-10-CM | POA: Diagnosis not present

## 2011-06-01 DIAGNOSIS — L89109 Pressure ulcer of unspecified part of back, unspecified stage: Secondary | ICD-10-CM | POA: Diagnosis not present

## 2011-06-01 DIAGNOSIS — D649 Anemia, unspecified: Secondary | ICD-10-CM | POA: Diagnosis not present

## 2011-06-01 DIAGNOSIS — J9610 Chronic respiratory failure, unspecified whether with hypoxia or hypercapnia: Secondary | ICD-10-CM | POA: Diagnosis not present

## 2011-06-01 DIAGNOSIS — Z930 Tracheostomy status: Secondary | ICD-10-CM | POA: Diagnosis not present

## 2011-06-09 DIAGNOSIS — J9610 Chronic respiratory failure, unspecified whether with hypoxia or hypercapnia: Secondary | ICD-10-CM | POA: Diagnosis not present

## 2011-06-09 DIAGNOSIS — D649 Anemia, unspecified: Secondary | ICD-10-CM | POA: Diagnosis not present

## 2011-06-09 DIAGNOSIS — L89109 Pressure ulcer of unspecified part of back, unspecified stage: Secondary | ICD-10-CM | POA: Diagnosis not present

## 2011-06-09 DIAGNOSIS — R0609 Other forms of dyspnea: Secondary | ICD-10-CM | POA: Diagnosis not present

## 2011-06-09 DIAGNOSIS — R0989 Other specified symptoms and signs involving the circulatory and respiratory systems: Secondary | ICD-10-CM | POA: Diagnosis not present

## 2011-06-10 DIAGNOSIS — D649 Anemia, unspecified: Secondary | ICD-10-CM | POA: Diagnosis not present

## 2011-06-10 DIAGNOSIS — L89109 Pressure ulcer of unspecified part of back, unspecified stage: Secondary | ICD-10-CM | POA: Diagnosis not present

## 2011-06-10 DIAGNOSIS — J9610 Chronic respiratory failure, unspecified whether with hypoxia or hypercapnia: Secondary | ICD-10-CM | POA: Diagnosis not present

## 2011-06-11 DIAGNOSIS — D649 Anemia, unspecified: Secondary | ICD-10-CM | POA: Diagnosis not present

## 2011-06-11 DIAGNOSIS — J9610 Chronic respiratory failure, unspecified whether with hypoxia or hypercapnia: Secondary | ICD-10-CM | POA: Diagnosis not present

## 2011-06-11 DIAGNOSIS — L89109 Pressure ulcer of unspecified part of back, unspecified stage: Secondary | ICD-10-CM | POA: Diagnosis not present

## 2011-06-12 DIAGNOSIS — L89109 Pressure ulcer of unspecified part of back, unspecified stage: Secondary | ICD-10-CM | POA: Diagnosis not present

## 2011-06-12 DIAGNOSIS — D649 Anemia, unspecified: Secondary | ICD-10-CM | POA: Diagnosis not present

## 2011-06-12 DIAGNOSIS — J9610 Chronic respiratory failure, unspecified whether with hypoxia or hypercapnia: Secondary | ICD-10-CM | POA: Diagnosis not present

## 2011-06-13 DIAGNOSIS — L89109 Pressure ulcer of unspecified part of back, unspecified stage: Secondary | ICD-10-CM | POA: Diagnosis not present

## 2011-06-13 DIAGNOSIS — D649 Anemia, unspecified: Secondary | ICD-10-CM | POA: Diagnosis not present

## 2011-06-13 DIAGNOSIS — J9610 Chronic respiratory failure, unspecified whether with hypoxia or hypercapnia: Secondary | ICD-10-CM | POA: Diagnosis not present

## 2011-06-14 DIAGNOSIS — D649 Anemia, unspecified: Secondary | ICD-10-CM | POA: Diagnosis not present

## 2011-06-14 DIAGNOSIS — L89109 Pressure ulcer of unspecified part of back, unspecified stage: Secondary | ICD-10-CM | POA: Diagnosis not present

## 2011-06-14 DIAGNOSIS — J9610 Chronic respiratory failure, unspecified whether with hypoxia or hypercapnia: Secondary | ICD-10-CM | POA: Diagnosis not present

## 2011-06-15 DIAGNOSIS — L89109 Pressure ulcer of unspecified part of back, unspecified stage: Secondary | ICD-10-CM | POA: Diagnosis not present

## 2011-06-15 DIAGNOSIS — D649 Anemia, unspecified: Secondary | ICD-10-CM | POA: Diagnosis not present

## 2011-06-15 DIAGNOSIS — J9610 Chronic respiratory failure, unspecified whether with hypoxia or hypercapnia: Secondary | ICD-10-CM | POA: Diagnosis not present

## 2011-06-16 DIAGNOSIS — J9610 Chronic respiratory failure, unspecified whether with hypoxia or hypercapnia: Secondary | ICD-10-CM | POA: Diagnosis not present

## 2011-06-16 DIAGNOSIS — L89109 Pressure ulcer of unspecified part of back, unspecified stage: Secondary | ICD-10-CM | POA: Diagnosis not present

## 2011-06-16 DIAGNOSIS — D649 Anemia, unspecified: Secondary | ICD-10-CM | POA: Diagnosis not present

## 2011-06-17 DIAGNOSIS — J9610 Chronic respiratory failure, unspecified whether with hypoxia or hypercapnia: Secondary | ICD-10-CM | POA: Diagnosis not present

## 2011-06-17 DIAGNOSIS — D649 Anemia, unspecified: Secondary | ICD-10-CM | POA: Diagnosis not present

## 2011-06-17 DIAGNOSIS — L89109 Pressure ulcer of unspecified part of back, unspecified stage: Secondary | ICD-10-CM | POA: Diagnosis not present

## 2011-06-18 DIAGNOSIS — J9610 Chronic respiratory failure, unspecified whether with hypoxia or hypercapnia: Secondary | ICD-10-CM | POA: Diagnosis not present

## 2011-06-18 DIAGNOSIS — L89109 Pressure ulcer of unspecified part of back, unspecified stage: Secondary | ICD-10-CM | POA: Diagnosis not present

## 2011-06-18 DIAGNOSIS — D649 Anemia, unspecified: Secondary | ICD-10-CM | POA: Diagnosis not present

## 2011-06-19 DIAGNOSIS — R0609 Other forms of dyspnea: Secondary | ICD-10-CM | POA: Diagnosis not present

## 2011-06-19 DIAGNOSIS — D649 Anemia, unspecified: Secondary | ICD-10-CM | POA: Diagnosis not present

## 2011-06-19 DIAGNOSIS — J9610 Chronic respiratory failure, unspecified whether with hypoxia or hypercapnia: Secondary | ICD-10-CM | POA: Diagnosis not present

## 2011-06-19 DIAGNOSIS — L89109 Pressure ulcer of unspecified part of back, unspecified stage: Secondary | ICD-10-CM | POA: Diagnosis not present

## 2011-06-19 DIAGNOSIS — R0989 Other specified symptoms and signs involving the circulatory and respiratory systems: Secondary | ICD-10-CM | POA: Diagnosis not present

## 2011-06-20 DIAGNOSIS — L89109 Pressure ulcer of unspecified part of back, unspecified stage: Secondary | ICD-10-CM | POA: Diagnosis not present

## 2011-06-20 DIAGNOSIS — J9610 Chronic respiratory failure, unspecified whether with hypoxia or hypercapnia: Secondary | ICD-10-CM | POA: Diagnosis not present

## 2011-06-20 DIAGNOSIS — R0609 Other forms of dyspnea: Secondary | ICD-10-CM | POA: Diagnosis not present

## 2011-06-20 DIAGNOSIS — D649 Anemia, unspecified: Secondary | ICD-10-CM | POA: Diagnosis not present

## 2011-06-20 DIAGNOSIS — R0989 Other specified symptoms and signs involving the circulatory and respiratory systems: Secondary | ICD-10-CM | POA: Diagnosis not present

## 2011-06-21 DIAGNOSIS — D649 Anemia, unspecified: Secondary | ICD-10-CM | POA: Diagnosis not present

## 2011-06-21 DIAGNOSIS — L89109 Pressure ulcer of unspecified part of back, unspecified stage: Secondary | ICD-10-CM | POA: Diagnosis not present

## 2011-06-21 DIAGNOSIS — R0609 Other forms of dyspnea: Secondary | ICD-10-CM | POA: Diagnosis not present

## 2011-06-21 DIAGNOSIS — J9610 Chronic respiratory failure, unspecified whether with hypoxia or hypercapnia: Secondary | ICD-10-CM | POA: Diagnosis not present

## 2011-06-21 DIAGNOSIS — R0989 Other specified symptoms and signs involving the circulatory and respiratory systems: Secondary | ICD-10-CM | POA: Diagnosis not present

## 2011-06-22 DIAGNOSIS — R0609 Other forms of dyspnea: Secondary | ICD-10-CM | POA: Diagnosis not present

## 2011-06-22 DIAGNOSIS — D649 Anemia, unspecified: Secondary | ICD-10-CM | POA: Diagnosis not present

## 2011-06-22 DIAGNOSIS — J9610 Chronic respiratory failure, unspecified whether with hypoxia or hypercapnia: Secondary | ICD-10-CM | POA: Diagnosis not present

## 2011-06-22 DIAGNOSIS — L89109 Pressure ulcer of unspecified part of back, unspecified stage: Secondary | ICD-10-CM | POA: Diagnosis not present

## 2011-06-22 DIAGNOSIS — R0989 Other specified symptoms and signs involving the circulatory and respiratory systems: Secondary | ICD-10-CM | POA: Diagnosis not present

## 2011-06-23 DIAGNOSIS — L89109 Pressure ulcer of unspecified part of back, unspecified stage: Secondary | ICD-10-CM | POA: Diagnosis not present

## 2011-06-23 DIAGNOSIS — D649 Anemia, unspecified: Secondary | ICD-10-CM | POA: Diagnosis not present

## 2011-06-23 DIAGNOSIS — R0609 Other forms of dyspnea: Secondary | ICD-10-CM | POA: Diagnosis not present

## 2011-06-23 DIAGNOSIS — J9610 Chronic respiratory failure, unspecified whether with hypoxia or hypercapnia: Secondary | ICD-10-CM | POA: Diagnosis not present

## 2011-06-24 DIAGNOSIS — D649 Anemia, unspecified: Secondary | ICD-10-CM | POA: Diagnosis not present

## 2011-06-24 DIAGNOSIS — J9610 Chronic respiratory failure, unspecified whether with hypoxia or hypercapnia: Secondary | ICD-10-CM | POA: Diagnosis not present

## 2011-06-24 DIAGNOSIS — L89109 Pressure ulcer of unspecified part of back, unspecified stage: Secondary | ICD-10-CM | POA: Diagnosis not present

## 2011-06-24 DIAGNOSIS — R0609 Other forms of dyspnea: Secondary | ICD-10-CM | POA: Diagnosis not present

## 2011-06-25 DIAGNOSIS — J9610 Chronic respiratory failure, unspecified whether with hypoxia or hypercapnia: Secondary | ICD-10-CM | POA: Diagnosis not present

## 2011-06-25 DIAGNOSIS — R0609 Other forms of dyspnea: Secondary | ICD-10-CM | POA: Diagnosis not present

## 2011-06-25 DIAGNOSIS — D649 Anemia, unspecified: Secondary | ICD-10-CM | POA: Diagnosis not present

## 2011-06-25 DIAGNOSIS — L89109 Pressure ulcer of unspecified part of back, unspecified stage: Secondary | ICD-10-CM | POA: Diagnosis not present

## 2011-06-26 DIAGNOSIS — J9610 Chronic respiratory failure, unspecified whether with hypoxia or hypercapnia: Secondary | ICD-10-CM | POA: Diagnosis not present

## 2011-06-26 DIAGNOSIS — Z930 Tracheostomy status: Secondary | ICD-10-CM | POA: Diagnosis not present

## 2011-06-26 DIAGNOSIS — L89109 Pressure ulcer of unspecified part of back, unspecified stage: Secondary | ICD-10-CM | POA: Diagnosis not present

## 2011-06-27 DIAGNOSIS — J9610 Chronic respiratory failure, unspecified whether with hypoxia or hypercapnia: Secondary | ICD-10-CM | POA: Diagnosis not present

## 2011-06-27 DIAGNOSIS — Z930 Tracheostomy status: Secondary | ICD-10-CM | POA: Diagnosis not present

## 2011-06-27 DIAGNOSIS — L89109 Pressure ulcer of unspecified part of back, unspecified stage: Secondary | ICD-10-CM | POA: Diagnosis not present

## 2011-06-28 DIAGNOSIS — Z9981 Dependence on supplemental oxygen: Secondary | ICD-10-CM | POA: Diagnosis not present

## 2011-06-28 DIAGNOSIS — J9610 Chronic respiratory failure, unspecified whether with hypoxia or hypercapnia: Secondary | ICD-10-CM | POA: Diagnosis not present

## 2011-06-28 DIAGNOSIS — L89109 Pressure ulcer of unspecified part of back, unspecified stage: Secondary | ICD-10-CM | POA: Diagnosis not present

## 2011-06-28 DIAGNOSIS — R279 Unspecified lack of coordination: Secondary | ICD-10-CM | POA: Diagnosis not present

## 2011-06-28 DIAGNOSIS — Z930 Tracheostomy status: Secondary | ICD-10-CM | POA: Diagnosis not present

## 2011-07-01 DIAGNOSIS — R499 Unspecified voice and resonance disorder: Secondary | ICD-10-CM | POA: Diagnosis not present

## 2011-07-01 DIAGNOSIS — I10 Essential (primary) hypertension: Secondary | ICD-10-CM | POA: Diagnosis not present

## 2011-07-01 DIAGNOSIS — L89109 Pressure ulcer of unspecified part of back, unspecified stage: Secondary | ICD-10-CM | POA: Diagnosis not present

## 2011-07-01 DIAGNOSIS — R1313 Dysphagia, pharyngeal phase: Secondary | ICD-10-CM | POA: Diagnosis not present

## 2011-07-01 DIAGNOSIS — K5900 Constipation, unspecified: Secondary | ICD-10-CM | POA: Diagnosis not present

## 2011-07-01 DIAGNOSIS — D649 Anemia, unspecified: Secondary | ICD-10-CM | POA: Diagnosis not present

## 2011-07-01 DIAGNOSIS — R262 Difficulty in walking, not elsewhere classified: Secondary | ICD-10-CM | POA: Diagnosis not present

## 2011-07-01 DIAGNOSIS — J9620 Acute and chronic respiratory failure, unspecified whether with hypoxia or hypercapnia: Secondary | ICD-10-CM | POA: Diagnosis not present

## 2011-07-01 DIAGNOSIS — M6281 Muscle weakness (generalized): Secondary | ICD-10-CM | POA: Diagnosis not present

## 2011-07-01 DIAGNOSIS — Z5189 Encounter for other specified aftercare: Secondary | ICD-10-CM | POA: Diagnosis not present

## 2011-07-02 DIAGNOSIS — L89109 Pressure ulcer of unspecified part of back, unspecified stage: Secondary | ICD-10-CM | POA: Diagnosis not present

## 2011-07-02 DIAGNOSIS — K5900 Constipation, unspecified: Secondary | ICD-10-CM | POA: Diagnosis not present

## 2011-07-02 DIAGNOSIS — D649 Anemia, unspecified: Secondary | ICD-10-CM | POA: Diagnosis not present

## 2011-07-02 DIAGNOSIS — J9620 Acute and chronic respiratory failure, unspecified whether with hypoxia or hypercapnia: Secondary | ICD-10-CM | POA: Diagnosis not present

## 2011-07-02 DIAGNOSIS — Z5189 Encounter for other specified aftercare: Secondary | ICD-10-CM | POA: Diagnosis not present

## 2011-07-02 DIAGNOSIS — I10 Essential (primary) hypertension: Secondary | ICD-10-CM | POA: Diagnosis not present

## 2011-07-03 DIAGNOSIS — L89109 Pressure ulcer of unspecified part of back, unspecified stage: Secondary | ICD-10-CM | POA: Diagnosis not present

## 2011-07-03 DIAGNOSIS — D649 Anemia, unspecified: Secondary | ICD-10-CM | POA: Diagnosis not present

## 2011-07-03 DIAGNOSIS — J9620 Acute and chronic respiratory failure, unspecified whether with hypoxia or hypercapnia: Secondary | ICD-10-CM | POA: Diagnosis not present

## 2011-07-03 DIAGNOSIS — K5900 Constipation, unspecified: Secondary | ICD-10-CM | POA: Diagnosis not present

## 2011-07-03 DIAGNOSIS — I10 Essential (primary) hypertension: Secondary | ICD-10-CM | POA: Diagnosis not present

## 2011-07-03 DIAGNOSIS — G4730 Sleep apnea, unspecified: Secondary | ICD-10-CM | POA: Diagnosis not present

## 2011-07-03 DIAGNOSIS — Z5189 Encounter for other specified aftercare: Secondary | ICD-10-CM | POA: Diagnosis not present

## 2011-07-04 DIAGNOSIS — I10 Essential (primary) hypertension: Secondary | ICD-10-CM | POA: Diagnosis not present

## 2011-07-04 DIAGNOSIS — R454 Irritability and anger: Secondary | ICD-10-CM | POA: Diagnosis not present

## 2011-07-04 DIAGNOSIS — L89109 Pressure ulcer of unspecified part of back, unspecified stage: Secondary | ICD-10-CM | POA: Diagnosis not present

## 2011-07-04 DIAGNOSIS — K5900 Constipation, unspecified: Secondary | ICD-10-CM | POA: Diagnosis not present

## 2011-07-04 DIAGNOSIS — J9620 Acute and chronic respiratory failure, unspecified whether with hypoxia or hypercapnia: Secondary | ICD-10-CM | POA: Diagnosis not present

## 2011-07-04 DIAGNOSIS — Z5189 Encounter for other specified aftercare: Secondary | ICD-10-CM | POA: Diagnosis not present

## 2011-07-04 DIAGNOSIS — D649 Anemia, unspecified: Secondary | ICD-10-CM | POA: Diagnosis not present

## 2011-07-04 DIAGNOSIS — F438 Other reactions to severe stress: Secondary | ICD-10-CM | POA: Diagnosis not present

## 2011-07-05 DIAGNOSIS — Z5189 Encounter for other specified aftercare: Secondary | ICD-10-CM | POA: Diagnosis not present

## 2011-07-05 DIAGNOSIS — J9620 Acute and chronic respiratory failure, unspecified whether with hypoxia or hypercapnia: Secondary | ICD-10-CM | POA: Diagnosis not present

## 2011-07-05 DIAGNOSIS — D649 Anemia, unspecified: Secondary | ICD-10-CM | POA: Diagnosis not present

## 2011-07-05 DIAGNOSIS — L89109 Pressure ulcer of unspecified part of back, unspecified stage: Secondary | ICD-10-CM | POA: Diagnosis not present

## 2011-07-05 DIAGNOSIS — I10 Essential (primary) hypertension: Secondary | ICD-10-CM | POA: Diagnosis not present

## 2011-07-05 DIAGNOSIS — K5900 Constipation, unspecified: Secondary | ICD-10-CM | POA: Diagnosis not present

## 2011-07-06 DIAGNOSIS — J9620 Acute and chronic respiratory failure, unspecified whether with hypoxia or hypercapnia: Secondary | ICD-10-CM | POA: Diagnosis not present

## 2011-07-06 DIAGNOSIS — R1313 Dysphagia, pharyngeal phase: Secondary | ICD-10-CM | POA: Diagnosis not present

## 2011-07-06 DIAGNOSIS — D649 Anemia, unspecified: Secondary | ICD-10-CM | POA: Diagnosis not present

## 2011-07-06 DIAGNOSIS — R499 Unspecified voice and resonance disorder: Secondary | ICD-10-CM | POA: Diagnosis not present

## 2011-07-06 DIAGNOSIS — R262 Difficulty in walking, not elsewhere classified: Secondary | ICD-10-CM | POA: Diagnosis not present

## 2011-07-06 DIAGNOSIS — Z5189 Encounter for other specified aftercare: Secondary | ICD-10-CM | POA: Diagnosis not present

## 2011-07-06 DIAGNOSIS — K5900 Constipation, unspecified: Secondary | ICD-10-CM | POA: Diagnosis not present

## 2011-07-06 DIAGNOSIS — M6281 Muscle weakness (generalized): Secondary | ICD-10-CM | POA: Diagnosis not present

## 2011-07-06 DIAGNOSIS — I10 Essential (primary) hypertension: Secondary | ICD-10-CM | POA: Diagnosis not present

## 2011-07-06 DIAGNOSIS — L89109 Pressure ulcer of unspecified part of back, unspecified stage: Secondary | ICD-10-CM | POA: Diagnosis not present

## 2011-07-08 DIAGNOSIS — K5900 Constipation, unspecified: Secondary | ICD-10-CM | POA: Diagnosis not present

## 2011-07-08 DIAGNOSIS — D649 Anemia, unspecified: Secondary | ICD-10-CM | POA: Diagnosis not present

## 2011-07-08 DIAGNOSIS — L89109 Pressure ulcer of unspecified part of back, unspecified stage: Secondary | ICD-10-CM | POA: Diagnosis not present

## 2011-07-08 DIAGNOSIS — J9620 Acute and chronic respiratory failure, unspecified whether with hypoxia or hypercapnia: Secondary | ICD-10-CM | POA: Diagnosis not present

## 2011-07-08 DIAGNOSIS — I10 Essential (primary) hypertension: Secondary | ICD-10-CM | POA: Diagnosis not present

## 2011-07-08 DIAGNOSIS — Z5189 Encounter for other specified aftercare: Secondary | ICD-10-CM | POA: Diagnosis not present

## 2011-07-09 DIAGNOSIS — K5900 Constipation, unspecified: Secondary | ICD-10-CM | POA: Diagnosis not present

## 2011-07-09 DIAGNOSIS — J9620 Acute and chronic respiratory failure, unspecified whether with hypoxia or hypercapnia: Secondary | ICD-10-CM | POA: Diagnosis not present

## 2011-07-09 DIAGNOSIS — L89109 Pressure ulcer of unspecified part of back, unspecified stage: Secondary | ICD-10-CM | POA: Diagnosis not present

## 2011-07-09 DIAGNOSIS — D649 Anemia, unspecified: Secondary | ICD-10-CM | POA: Diagnosis not present

## 2011-07-09 DIAGNOSIS — I10 Essential (primary) hypertension: Secondary | ICD-10-CM | POA: Diagnosis not present

## 2011-07-09 DIAGNOSIS — Z5189 Encounter for other specified aftercare: Secondary | ICD-10-CM | POA: Diagnosis not present

## 2011-07-10 DIAGNOSIS — J9620 Acute and chronic respiratory failure, unspecified whether with hypoxia or hypercapnia: Secondary | ICD-10-CM | POA: Diagnosis not present

## 2011-07-10 DIAGNOSIS — D649 Anemia, unspecified: Secondary | ICD-10-CM | POA: Diagnosis not present

## 2011-07-10 DIAGNOSIS — I10 Essential (primary) hypertension: Secondary | ICD-10-CM | POA: Diagnosis not present

## 2011-07-10 DIAGNOSIS — K5900 Constipation, unspecified: Secondary | ICD-10-CM | POA: Diagnosis not present

## 2011-07-10 DIAGNOSIS — L89109 Pressure ulcer of unspecified part of back, unspecified stage: Secondary | ICD-10-CM | POA: Diagnosis not present

## 2011-07-10 DIAGNOSIS — Z5189 Encounter for other specified aftercare: Secondary | ICD-10-CM | POA: Diagnosis not present

## 2011-07-11 DIAGNOSIS — K5900 Constipation, unspecified: Secondary | ICD-10-CM | POA: Diagnosis not present

## 2011-07-11 DIAGNOSIS — Z5189 Encounter for other specified aftercare: Secondary | ICD-10-CM | POA: Diagnosis not present

## 2011-07-11 DIAGNOSIS — L89109 Pressure ulcer of unspecified part of back, unspecified stage: Secondary | ICD-10-CM | POA: Diagnosis not present

## 2011-07-11 DIAGNOSIS — D649 Anemia, unspecified: Secondary | ICD-10-CM | POA: Diagnosis not present

## 2011-07-11 DIAGNOSIS — I10 Essential (primary) hypertension: Secondary | ICD-10-CM | POA: Diagnosis not present

## 2011-07-11 DIAGNOSIS — J9620 Acute and chronic respiratory failure, unspecified whether with hypoxia or hypercapnia: Secondary | ICD-10-CM | POA: Diagnosis not present

## 2011-07-12 DIAGNOSIS — L89109 Pressure ulcer of unspecified part of back, unspecified stage: Secondary | ICD-10-CM | POA: Diagnosis not present

## 2011-07-12 DIAGNOSIS — Z5189 Encounter for other specified aftercare: Secondary | ICD-10-CM | POA: Diagnosis not present

## 2011-07-12 DIAGNOSIS — J9620 Acute and chronic respiratory failure, unspecified whether with hypoxia or hypercapnia: Secondary | ICD-10-CM | POA: Diagnosis not present

## 2011-07-12 DIAGNOSIS — K5900 Constipation, unspecified: Secondary | ICD-10-CM | POA: Diagnosis not present

## 2011-07-12 DIAGNOSIS — I10 Essential (primary) hypertension: Secondary | ICD-10-CM | POA: Diagnosis not present

## 2011-07-12 DIAGNOSIS — D649 Anemia, unspecified: Secondary | ICD-10-CM | POA: Diagnosis not present

## 2011-07-14 DIAGNOSIS — J9620 Acute and chronic respiratory failure, unspecified whether with hypoxia or hypercapnia: Secondary | ICD-10-CM | POA: Diagnosis not present

## 2011-07-15 DIAGNOSIS — L89109 Pressure ulcer of unspecified part of back, unspecified stage: Secondary | ICD-10-CM | POA: Diagnosis not present

## 2011-07-15 DIAGNOSIS — Z5189 Encounter for other specified aftercare: Secondary | ICD-10-CM | POA: Diagnosis not present

## 2011-07-15 DIAGNOSIS — J9620 Acute and chronic respiratory failure, unspecified whether with hypoxia or hypercapnia: Secondary | ICD-10-CM | POA: Diagnosis not present

## 2011-07-15 DIAGNOSIS — K5900 Constipation, unspecified: Secondary | ICD-10-CM | POA: Diagnosis not present

## 2011-07-15 DIAGNOSIS — D649 Anemia, unspecified: Secondary | ICD-10-CM | POA: Diagnosis not present

## 2011-07-15 DIAGNOSIS — I10 Essential (primary) hypertension: Secondary | ICD-10-CM | POA: Diagnosis not present

## 2011-07-16 DIAGNOSIS — Z5189 Encounter for other specified aftercare: Secondary | ICD-10-CM | POA: Diagnosis not present

## 2011-07-16 DIAGNOSIS — J9620 Acute and chronic respiratory failure, unspecified whether with hypoxia or hypercapnia: Secondary | ICD-10-CM | POA: Diagnosis not present

## 2011-07-16 DIAGNOSIS — K5900 Constipation, unspecified: Secondary | ICD-10-CM | POA: Diagnosis not present

## 2011-07-16 DIAGNOSIS — D649 Anemia, unspecified: Secondary | ICD-10-CM | POA: Diagnosis not present

## 2011-07-16 DIAGNOSIS — L89109 Pressure ulcer of unspecified part of back, unspecified stage: Secondary | ICD-10-CM | POA: Diagnosis not present

## 2011-07-16 DIAGNOSIS — I10 Essential (primary) hypertension: Secondary | ICD-10-CM | POA: Diagnosis not present

## 2011-07-17 DIAGNOSIS — K5900 Constipation, unspecified: Secondary | ICD-10-CM | POA: Diagnosis not present

## 2011-07-17 DIAGNOSIS — D649 Anemia, unspecified: Secondary | ICD-10-CM | POA: Diagnosis not present

## 2011-07-17 DIAGNOSIS — J9620 Acute and chronic respiratory failure, unspecified whether with hypoxia or hypercapnia: Secondary | ICD-10-CM | POA: Diagnosis not present

## 2011-07-17 DIAGNOSIS — L89109 Pressure ulcer of unspecified part of back, unspecified stage: Secondary | ICD-10-CM | POA: Diagnosis not present

## 2011-07-17 DIAGNOSIS — Z5189 Encounter for other specified aftercare: Secondary | ICD-10-CM | POA: Diagnosis not present

## 2011-07-17 DIAGNOSIS — I10 Essential (primary) hypertension: Secondary | ICD-10-CM | POA: Diagnosis not present

## 2011-07-18 DIAGNOSIS — L89109 Pressure ulcer of unspecified part of back, unspecified stage: Secondary | ICD-10-CM | POA: Diagnosis not present

## 2011-07-18 DIAGNOSIS — I10 Essential (primary) hypertension: Secondary | ICD-10-CM | POA: Diagnosis not present

## 2011-07-18 DIAGNOSIS — Z5189 Encounter for other specified aftercare: Secondary | ICD-10-CM | POA: Diagnosis not present

## 2011-07-18 DIAGNOSIS — J9620 Acute and chronic respiratory failure, unspecified whether with hypoxia or hypercapnia: Secondary | ICD-10-CM | POA: Diagnosis not present

## 2011-07-18 DIAGNOSIS — K5900 Constipation, unspecified: Secondary | ICD-10-CM | POA: Diagnosis not present

## 2011-07-18 DIAGNOSIS — D649 Anemia, unspecified: Secondary | ICD-10-CM | POA: Diagnosis not present

## 2011-07-19 DIAGNOSIS — I10 Essential (primary) hypertension: Secondary | ICD-10-CM | POA: Diagnosis not present

## 2011-07-19 DIAGNOSIS — D649 Anemia, unspecified: Secondary | ICD-10-CM | POA: Diagnosis not present

## 2011-07-19 DIAGNOSIS — J9620 Acute and chronic respiratory failure, unspecified whether with hypoxia or hypercapnia: Secondary | ICD-10-CM | POA: Diagnosis not present

## 2011-07-19 DIAGNOSIS — L89109 Pressure ulcer of unspecified part of back, unspecified stage: Secondary | ICD-10-CM | POA: Diagnosis not present

## 2011-07-19 DIAGNOSIS — K5900 Constipation, unspecified: Secondary | ICD-10-CM | POA: Diagnosis not present

## 2011-07-19 DIAGNOSIS — Z5189 Encounter for other specified aftercare: Secondary | ICD-10-CM | POA: Diagnosis not present

## 2011-07-20 DIAGNOSIS — I10 Essential (primary) hypertension: Secondary | ICD-10-CM | POA: Diagnosis not present

## 2011-07-20 DIAGNOSIS — L89109 Pressure ulcer of unspecified part of back, unspecified stage: Secondary | ICD-10-CM | POA: Diagnosis not present

## 2011-07-20 DIAGNOSIS — D649 Anemia, unspecified: Secondary | ICD-10-CM | POA: Diagnosis not present

## 2011-07-20 DIAGNOSIS — K5900 Constipation, unspecified: Secondary | ICD-10-CM | POA: Diagnosis not present

## 2011-07-20 DIAGNOSIS — Z5189 Encounter for other specified aftercare: Secondary | ICD-10-CM | POA: Diagnosis not present

## 2011-07-20 DIAGNOSIS — J9620 Acute and chronic respiratory failure, unspecified whether with hypoxia or hypercapnia: Secondary | ICD-10-CM | POA: Diagnosis not present

## 2011-07-22 DIAGNOSIS — J9620 Acute and chronic respiratory failure, unspecified whether with hypoxia or hypercapnia: Secondary | ICD-10-CM | POA: Diagnosis not present

## 2011-07-22 DIAGNOSIS — K5900 Constipation, unspecified: Secondary | ICD-10-CM | POA: Diagnosis not present

## 2011-07-22 DIAGNOSIS — I10 Essential (primary) hypertension: Secondary | ICD-10-CM | POA: Diagnosis not present

## 2011-07-22 DIAGNOSIS — D649 Anemia, unspecified: Secondary | ICD-10-CM | POA: Diagnosis not present

## 2011-07-22 DIAGNOSIS — Z5189 Encounter for other specified aftercare: Secondary | ICD-10-CM | POA: Diagnosis not present

## 2011-07-22 DIAGNOSIS — L89109 Pressure ulcer of unspecified part of back, unspecified stage: Secondary | ICD-10-CM | POA: Diagnosis not present

## 2011-07-23 DIAGNOSIS — K5900 Constipation, unspecified: Secondary | ICD-10-CM | POA: Diagnosis not present

## 2011-07-23 DIAGNOSIS — I10 Essential (primary) hypertension: Secondary | ICD-10-CM | POA: Diagnosis not present

## 2011-07-23 DIAGNOSIS — Z5189 Encounter for other specified aftercare: Secondary | ICD-10-CM | POA: Diagnosis not present

## 2011-07-23 DIAGNOSIS — J9620 Acute and chronic respiratory failure, unspecified whether with hypoxia or hypercapnia: Secondary | ICD-10-CM | POA: Diagnosis not present

## 2011-07-23 DIAGNOSIS — D649 Anemia, unspecified: Secondary | ICD-10-CM | POA: Diagnosis not present

## 2011-07-23 DIAGNOSIS — L89109 Pressure ulcer of unspecified part of back, unspecified stage: Secondary | ICD-10-CM | POA: Diagnosis not present

## 2011-07-24 DIAGNOSIS — D649 Anemia, unspecified: Secondary | ICD-10-CM | POA: Diagnosis not present

## 2011-07-24 DIAGNOSIS — L89109 Pressure ulcer of unspecified part of back, unspecified stage: Secondary | ICD-10-CM | POA: Diagnosis not present

## 2011-07-24 DIAGNOSIS — Z5189 Encounter for other specified aftercare: Secondary | ICD-10-CM | POA: Diagnosis not present

## 2011-07-24 DIAGNOSIS — K5900 Constipation, unspecified: Secondary | ICD-10-CM | POA: Diagnosis not present

## 2011-07-24 DIAGNOSIS — J9620 Acute and chronic respiratory failure, unspecified whether with hypoxia or hypercapnia: Secondary | ICD-10-CM | POA: Diagnosis not present

## 2011-07-24 DIAGNOSIS — I10 Essential (primary) hypertension: Secondary | ICD-10-CM | POA: Diagnosis not present

## 2011-07-25 DIAGNOSIS — K5900 Constipation, unspecified: Secondary | ICD-10-CM | POA: Diagnosis not present

## 2011-07-25 DIAGNOSIS — J9620 Acute and chronic respiratory failure, unspecified whether with hypoxia or hypercapnia: Secondary | ICD-10-CM | POA: Diagnosis not present

## 2011-07-25 DIAGNOSIS — L89109 Pressure ulcer of unspecified part of back, unspecified stage: Secondary | ICD-10-CM | POA: Diagnosis not present

## 2011-07-25 DIAGNOSIS — D649 Anemia, unspecified: Secondary | ICD-10-CM | POA: Diagnosis not present

## 2011-07-25 DIAGNOSIS — Z5189 Encounter for other specified aftercare: Secondary | ICD-10-CM | POA: Diagnosis not present

## 2011-07-25 DIAGNOSIS — I10 Essential (primary) hypertension: Secondary | ICD-10-CM | POA: Diagnosis not present

## 2011-07-26 DIAGNOSIS — K5900 Constipation, unspecified: Secondary | ICD-10-CM | POA: Diagnosis not present

## 2011-07-26 DIAGNOSIS — D649 Anemia, unspecified: Secondary | ICD-10-CM | POA: Diagnosis not present

## 2011-07-26 DIAGNOSIS — J9620 Acute and chronic respiratory failure, unspecified whether with hypoxia or hypercapnia: Secondary | ICD-10-CM | POA: Diagnosis not present

## 2011-07-26 DIAGNOSIS — Z5189 Encounter for other specified aftercare: Secondary | ICD-10-CM | POA: Diagnosis not present

## 2011-07-26 DIAGNOSIS — L89109 Pressure ulcer of unspecified part of back, unspecified stage: Secondary | ICD-10-CM | POA: Diagnosis not present

## 2011-07-26 DIAGNOSIS — I10 Essential (primary) hypertension: Secondary | ICD-10-CM | POA: Diagnosis not present

## 2011-07-27 DIAGNOSIS — I10 Essential (primary) hypertension: Secondary | ICD-10-CM | POA: Diagnosis not present

## 2011-07-27 DIAGNOSIS — Z5189 Encounter for other specified aftercare: Secondary | ICD-10-CM | POA: Diagnosis not present

## 2011-07-27 DIAGNOSIS — J9620 Acute and chronic respiratory failure, unspecified whether with hypoxia or hypercapnia: Secondary | ICD-10-CM | POA: Diagnosis not present

## 2011-07-27 DIAGNOSIS — L89109 Pressure ulcer of unspecified part of back, unspecified stage: Secondary | ICD-10-CM | POA: Diagnosis not present

## 2011-07-27 DIAGNOSIS — K5900 Constipation, unspecified: Secondary | ICD-10-CM | POA: Diagnosis not present

## 2011-07-27 DIAGNOSIS — D649 Anemia, unspecified: Secondary | ICD-10-CM | POA: Diagnosis not present

## 2011-07-29 DIAGNOSIS — Z5189 Encounter for other specified aftercare: Secondary | ICD-10-CM | POA: Diagnosis not present

## 2011-07-29 DIAGNOSIS — I10 Essential (primary) hypertension: Secondary | ICD-10-CM | POA: Diagnosis not present

## 2011-07-29 DIAGNOSIS — L89109 Pressure ulcer of unspecified part of back, unspecified stage: Secondary | ICD-10-CM | POA: Diagnosis not present

## 2011-07-29 DIAGNOSIS — J9620 Acute and chronic respiratory failure, unspecified whether with hypoxia or hypercapnia: Secondary | ICD-10-CM | POA: Diagnosis not present

## 2011-07-29 DIAGNOSIS — D649 Anemia, unspecified: Secondary | ICD-10-CM | POA: Diagnosis not present

## 2011-07-29 DIAGNOSIS — K5900 Constipation, unspecified: Secondary | ICD-10-CM | POA: Diagnosis not present

## 2011-07-30 DIAGNOSIS — J9620 Acute and chronic respiratory failure, unspecified whether with hypoxia or hypercapnia: Secondary | ICD-10-CM | POA: Diagnosis not present

## 2011-07-30 DIAGNOSIS — L89109 Pressure ulcer of unspecified part of back, unspecified stage: Secondary | ICD-10-CM | POA: Diagnosis not present

## 2011-07-30 DIAGNOSIS — G4730 Sleep apnea, unspecified: Secondary | ICD-10-CM | POA: Diagnosis not present

## 2011-07-30 DIAGNOSIS — Z5189 Encounter for other specified aftercare: Secondary | ICD-10-CM | POA: Diagnosis not present

## 2011-07-30 DIAGNOSIS — K5900 Constipation, unspecified: Secondary | ICD-10-CM | POA: Diagnosis not present

## 2011-07-30 DIAGNOSIS — I10 Essential (primary) hypertension: Secondary | ICD-10-CM | POA: Diagnosis not present

## 2011-07-30 DIAGNOSIS — D649 Anemia, unspecified: Secondary | ICD-10-CM | POA: Diagnosis not present

## 2011-07-31 DIAGNOSIS — I10 Essential (primary) hypertension: Secondary | ICD-10-CM | POA: Diagnosis not present

## 2011-07-31 DIAGNOSIS — J9620 Acute and chronic respiratory failure, unspecified whether with hypoxia or hypercapnia: Secondary | ICD-10-CM | POA: Diagnosis not present

## 2011-07-31 DIAGNOSIS — D649 Anemia, unspecified: Secondary | ICD-10-CM | POA: Diagnosis not present

## 2011-07-31 DIAGNOSIS — K5900 Constipation, unspecified: Secondary | ICD-10-CM | POA: Diagnosis not present

## 2011-07-31 DIAGNOSIS — L89109 Pressure ulcer of unspecified part of back, unspecified stage: Secondary | ICD-10-CM | POA: Diagnosis not present

## 2011-07-31 DIAGNOSIS — Z5189 Encounter for other specified aftercare: Secondary | ICD-10-CM | POA: Diagnosis not present

## 2011-08-01 DIAGNOSIS — Z5189 Encounter for other specified aftercare: Secondary | ICD-10-CM | POA: Diagnosis not present

## 2011-08-01 DIAGNOSIS — D649 Anemia, unspecified: Secondary | ICD-10-CM | POA: Diagnosis not present

## 2011-08-01 DIAGNOSIS — I10 Essential (primary) hypertension: Secondary | ICD-10-CM | POA: Diagnosis not present

## 2011-08-01 DIAGNOSIS — L89109 Pressure ulcer of unspecified part of back, unspecified stage: Secondary | ICD-10-CM | POA: Diagnosis not present

## 2011-08-01 DIAGNOSIS — K5900 Constipation, unspecified: Secondary | ICD-10-CM | POA: Diagnosis not present

## 2011-08-01 DIAGNOSIS — J9620 Acute and chronic respiratory failure, unspecified whether with hypoxia or hypercapnia: Secondary | ICD-10-CM | POA: Diagnosis not present

## 2011-08-02 DIAGNOSIS — L89109 Pressure ulcer of unspecified part of back, unspecified stage: Secondary | ICD-10-CM | POA: Diagnosis not present

## 2011-08-02 DIAGNOSIS — K5900 Constipation, unspecified: Secondary | ICD-10-CM | POA: Diagnosis not present

## 2011-08-02 DIAGNOSIS — J9620 Acute and chronic respiratory failure, unspecified whether with hypoxia or hypercapnia: Secondary | ICD-10-CM | POA: Diagnosis not present

## 2011-08-02 DIAGNOSIS — I10 Essential (primary) hypertension: Secondary | ICD-10-CM | POA: Diagnosis not present

## 2011-08-02 DIAGNOSIS — D649 Anemia, unspecified: Secondary | ICD-10-CM | POA: Diagnosis not present

## 2011-08-02 DIAGNOSIS — Z5189 Encounter for other specified aftercare: Secondary | ICD-10-CM | POA: Diagnosis not present

## 2011-08-03 DIAGNOSIS — L89109 Pressure ulcer of unspecified part of back, unspecified stage: Secondary | ICD-10-CM | POA: Diagnosis not present

## 2011-08-03 DIAGNOSIS — J9620 Acute and chronic respiratory failure, unspecified whether with hypoxia or hypercapnia: Secondary | ICD-10-CM | POA: Diagnosis not present

## 2011-08-03 DIAGNOSIS — I10 Essential (primary) hypertension: Secondary | ICD-10-CM | POA: Diagnosis not present

## 2011-08-03 DIAGNOSIS — K5900 Constipation, unspecified: Secondary | ICD-10-CM | POA: Diagnosis not present

## 2011-08-03 DIAGNOSIS — Z5189 Encounter for other specified aftercare: Secondary | ICD-10-CM | POA: Diagnosis not present

## 2011-08-03 DIAGNOSIS — D649 Anemia, unspecified: Secondary | ICD-10-CM | POA: Diagnosis not present

## 2011-08-04 DIAGNOSIS — J9620 Acute and chronic respiratory failure, unspecified whether with hypoxia or hypercapnia: Secondary | ICD-10-CM | POA: Diagnosis not present

## 2011-08-06 DIAGNOSIS — I10 Essential (primary) hypertension: Secondary | ICD-10-CM | POA: Diagnosis not present

## 2011-08-06 DIAGNOSIS — G4730 Sleep apnea, unspecified: Secondary | ICD-10-CM | POA: Diagnosis not present

## 2011-08-09 ENCOUNTER — Telehealth (INDEPENDENT_AMBULATORY_CARE_PROVIDER_SITE_OTHER): Payer: Self-pay | Admitting: General Surgery

## 2011-08-09 DIAGNOSIS — J449 Chronic obstructive pulmonary disease, unspecified: Secondary | ICD-10-CM | POA: Diagnosis not present

## 2011-08-09 DIAGNOSIS — L89609 Pressure ulcer of unspecified heel, unspecified stage: Secondary | ICD-10-CM | POA: Diagnosis not present

## 2011-08-09 DIAGNOSIS — Z430 Encounter for attention to tracheostomy: Secondary | ICD-10-CM | POA: Diagnosis not present

## 2011-08-09 DIAGNOSIS — G4733 Obstructive sleep apnea (adult) (pediatric): Secondary | ICD-10-CM | POA: Diagnosis not present

## 2011-08-09 DIAGNOSIS — I509 Heart failure, unspecified: Secondary | ICD-10-CM | POA: Diagnosis not present

## 2011-08-09 DIAGNOSIS — L8994 Pressure ulcer of unspecified site, stage 4: Secondary | ICD-10-CM | POA: Diagnosis not present

## 2011-08-09 NOTE — Telephone Encounter (Signed)
Related Dr. Eliberto Ivory response to inquiry for orders on this prior pt.  Dois Davenport, nurse for [facility] understands and will try other resources.

## 2011-08-09 NOTE — Telephone Encounter (Signed)
No  We did not do his trach so I will not give any orders regarding it or O2 or anything of that nature  We only took care or his Decub while he was a [facility] patient.  Surgery was 8 months ago.  I will agree to whatever they want to do with his wound, but that's it, nothing more

## 2011-08-09 NOTE — Telephone Encounter (Signed)
Dois Davenport, nurse with [facility], calling on behalf of this pt.  He was pt of Dr. Magnus Ivan when transferred to [facility] from [facility] in November.  He was trached then and eventually transferred to [facility] in [place], [place] ([place]  They have now discharged him to home with Dr. Magnus Ivan as MD of record.  He still has a trach, but no order for O2, or suctioning, or changing out the trach, etc.  He has a 10 cm x 20 cm x 4.8 cm sacral decubitus ulcer.  [facility] is asking for several orders today.  (1) The last documented time his trach was changed was March of 2013; need order to change it.  (2) There is not at suction machine for the trach, so need order for one.  (3.)  They need order for "spot check of SaO2 on room air and ambulating to see if he qualifies for O2, esp at night.  (4) Needs order for speech and for a respiratory therapy evaluation.  Dois Davenport apologizes that this pt has been "dumped" on Dr. Magnus Ivan, but he has no PCP of record at this time.  Dois Davenport will be glad to talk directly with Dr. Magnus Ivan if he needs to speak with her:  [phone].

## 2011-08-10 DIAGNOSIS — Z430 Encounter for attention to tracheostomy: Secondary | ICD-10-CM | POA: Diagnosis not present

## 2011-08-10 DIAGNOSIS — L8994 Pressure ulcer of unspecified site, stage 4: Secondary | ICD-10-CM | POA: Diagnosis not present

## 2011-08-10 DIAGNOSIS — L89609 Pressure ulcer of unspecified heel, unspecified stage: Secondary | ICD-10-CM | POA: Diagnosis not present

## 2011-08-10 DIAGNOSIS — J449 Chronic obstructive pulmonary disease, unspecified: Secondary | ICD-10-CM | POA: Diagnosis not present

## 2011-08-10 DIAGNOSIS — I509 Heart failure, unspecified: Secondary | ICD-10-CM | POA: Diagnosis not present

## 2011-08-10 DIAGNOSIS — G4733 Obstructive sleep apnea (adult) (pediatric): Secondary | ICD-10-CM | POA: Diagnosis not present

## 2011-08-11 DIAGNOSIS — L89609 Pressure ulcer of unspecified heel, unspecified stage: Secondary | ICD-10-CM | POA: Diagnosis not present

## 2011-08-11 DIAGNOSIS — I509 Heart failure, unspecified: Secondary | ICD-10-CM | POA: Diagnosis not present

## 2011-08-11 DIAGNOSIS — J449 Chronic obstructive pulmonary disease, unspecified: Secondary | ICD-10-CM | POA: Diagnosis not present

## 2011-08-11 DIAGNOSIS — L8994 Pressure ulcer of unspecified site, stage 4: Secondary | ICD-10-CM | POA: Diagnosis not present

## 2011-08-11 DIAGNOSIS — Z430 Encounter for attention to tracheostomy: Secondary | ICD-10-CM | POA: Diagnosis not present

## 2011-08-11 DIAGNOSIS — G4733 Obstructive sleep apnea (adult) (pediatric): Secondary | ICD-10-CM | POA: Diagnosis not present

## 2011-08-12 DIAGNOSIS — I10 Essential (primary) hypertension: Secondary | ICD-10-CM | POA: Diagnosis not present

## 2011-08-12 DIAGNOSIS — L8994 Pressure ulcer of unspecified site, stage 4: Secondary | ICD-10-CM | POA: Diagnosis not present

## 2011-08-12 DIAGNOSIS — G4733 Obstructive sleep apnea (adult) (pediatric): Secondary | ICD-10-CM | POA: Diagnosis not present

## 2011-08-12 DIAGNOSIS — Z131 Encounter for screening for diabetes mellitus: Secondary | ICD-10-CM | POA: Diagnosis not present

## 2011-08-12 DIAGNOSIS — L0231 Cutaneous abscess of buttock: Secondary | ICD-10-CM | POA: Diagnosis not present

## 2011-08-12 DIAGNOSIS — Z430 Encounter for attention to tracheostomy: Secondary | ICD-10-CM | POA: Diagnosis not present

## 2011-08-12 DIAGNOSIS — E669 Obesity, unspecified: Secondary | ICD-10-CM | POA: Diagnosis not present

## 2011-08-12 DIAGNOSIS — J449 Chronic obstructive pulmonary disease, unspecified: Secondary | ICD-10-CM | POA: Diagnosis not present

## 2011-08-12 DIAGNOSIS — L89609 Pressure ulcer of unspecified heel, unspecified stage: Secondary | ICD-10-CM | POA: Diagnosis not present

## 2011-08-12 DIAGNOSIS — I509 Heart failure, unspecified: Secondary | ICD-10-CM | POA: Diagnosis not present

## 2011-08-12 DIAGNOSIS — L03317 Cellulitis of buttock: Secondary | ICD-10-CM | POA: Diagnosis not present

## 2011-08-13 DIAGNOSIS — L89609 Pressure ulcer of unspecified heel, unspecified stage: Secondary | ICD-10-CM | POA: Diagnosis not present

## 2011-08-13 DIAGNOSIS — Z430 Encounter for attention to tracheostomy: Secondary | ICD-10-CM | POA: Diagnosis not present

## 2011-08-13 DIAGNOSIS — J449 Chronic obstructive pulmonary disease, unspecified: Secondary | ICD-10-CM | POA: Diagnosis not present

## 2011-08-13 DIAGNOSIS — I509 Heart failure, unspecified: Secondary | ICD-10-CM | POA: Diagnosis not present

## 2011-08-13 DIAGNOSIS — L8994 Pressure ulcer of unspecified site, stage 4: Secondary | ICD-10-CM | POA: Diagnosis not present

## 2011-08-13 DIAGNOSIS — G4733 Obstructive sleep apnea (adult) (pediatric): Secondary | ICD-10-CM | POA: Diagnosis not present

## 2011-08-14 DIAGNOSIS — G4733 Obstructive sleep apnea (adult) (pediatric): Secondary | ICD-10-CM | POA: Diagnosis not present

## 2011-08-14 DIAGNOSIS — L8994 Pressure ulcer of unspecified site, stage 4: Secondary | ICD-10-CM | POA: Diagnosis not present

## 2011-08-14 DIAGNOSIS — J449 Chronic obstructive pulmonary disease, unspecified: Secondary | ICD-10-CM | POA: Diagnosis not present

## 2011-08-14 DIAGNOSIS — I509 Heart failure, unspecified: Secondary | ICD-10-CM | POA: Diagnosis not present

## 2011-08-14 DIAGNOSIS — Z430 Encounter for attention to tracheostomy: Secondary | ICD-10-CM | POA: Diagnosis not present

## 2011-08-14 DIAGNOSIS — L89609 Pressure ulcer of unspecified heel, unspecified stage: Secondary | ICD-10-CM | POA: Diagnosis not present

## 2011-08-16 DIAGNOSIS — Z430 Encounter for attention to tracheostomy: Secondary | ICD-10-CM | POA: Diagnosis not present

## 2011-08-16 DIAGNOSIS — I509 Heart failure, unspecified: Secondary | ICD-10-CM | POA: Diagnosis not present

## 2011-08-16 DIAGNOSIS — L8994 Pressure ulcer of unspecified site, stage 4: Secondary | ICD-10-CM | POA: Diagnosis not present

## 2011-08-16 DIAGNOSIS — L89609 Pressure ulcer of unspecified heel, unspecified stage: Secondary | ICD-10-CM | POA: Diagnosis not present

## 2011-08-16 DIAGNOSIS — J449 Chronic obstructive pulmonary disease, unspecified: Secondary | ICD-10-CM | POA: Diagnosis not present

## 2011-08-16 DIAGNOSIS — G4733 Obstructive sleep apnea (adult) (pediatric): Secondary | ICD-10-CM | POA: Diagnosis not present

## 2011-08-19 DIAGNOSIS — L89609 Pressure ulcer of unspecified heel, unspecified stage: Secondary | ICD-10-CM | POA: Diagnosis not present

## 2011-08-19 DIAGNOSIS — L8994 Pressure ulcer of unspecified site, stage 4: Secondary | ICD-10-CM | POA: Diagnosis not present

## 2011-08-19 DIAGNOSIS — J449 Chronic obstructive pulmonary disease, unspecified: Secondary | ICD-10-CM | POA: Diagnosis not present

## 2011-08-19 DIAGNOSIS — Z430 Encounter for attention to tracheostomy: Secondary | ICD-10-CM | POA: Diagnosis not present

## 2011-08-19 DIAGNOSIS — I509 Heart failure, unspecified: Secondary | ICD-10-CM | POA: Diagnosis not present

## 2011-08-19 DIAGNOSIS — G4733 Obstructive sleep apnea (adult) (pediatric): Secondary | ICD-10-CM | POA: Diagnosis not present

## 2011-08-20 DIAGNOSIS — I509 Heart failure, unspecified: Secondary | ICD-10-CM | POA: Diagnosis not present

## 2011-08-20 DIAGNOSIS — Z430 Encounter for attention to tracheostomy: Secondary | ICD-10-CM | POA: Diagnosis not present

## 2011-08-20 DIAGNOSIS — L89609 Pressure ulcer of unspecified heel, unspecified stage: Secondary | ICD-10-CM | POA: Diagnosis not present

## 2011-08-20 DIAGNOSIS — G4733 Obstructive sleep apnea (adult) (pediatric): Secondary | ICD-10-CM | POA: Diagnosis not present

## 2011-08-20 DIAGNOSIS — L8994 Pressure ulcer of unspecified site, stage 4: Secondary | ICD-10-CM | POA: Diagnosis not present

## 2011-08-20 DIAGNOSIS — J449 Chronic obstructive pulmonary disease, unspecified: Secondary | ICD-10-CM | POA: Diagnosis not present

## 2011-08-22 DIAGNOSIS — J449 Chronic obstructive pulmonary disease, unspecified: Secondary | ICD-10-CM | POA: Diagnosis not present

## 2011-08-22 DIAGNOSIS — I509 Heart failure, unspecified: Secondary | ICD-10-CM | POA: Diagnosis not present

## 2011-08-22 DIAGNOSIS — L8994 Pressure ulcer of unspecified site, stage 4: Secondary | ICD-10-CM | POA: Diagnosis not present

## 2011-08-22 DIAGNOSIS — Z430 Encounter for attention to tracheostomy: Secondary | ICD-10-CM | POA: Diagnosis not present

## 2011-08-22 DIAGNOSIS — G4733 Obstructive sleep apnea (adult) (pediatric): Secondary | ICD-10-CM | POA: Diagnosis not present

## 2011-08-22 DIAGNOSIS — L89609 Pressure ulcer of unspecified heel, unspecified stage: Secondary | ICD-10-CM | POA: Diagnosis not present

## 2011-08-23 DIAGNOSIS — Z430 Encounter for attention to tracheostomy: Secondary | ICD-10-CM | POA: Diagnosis not present

## 2011-08-23 DIAGNOSIS — L89609 Pressure ulcer of unspecified heel, unspecified stage: Secondary | ICD-10-CM | POA: Diagnosis not present

## 2011-08-23 DIAGNOSIS — J449 Chronic obstructive pulmonary disease, unspecified: Secondary | ICD-10-CM | POA: Diagnosis not present

## 2011-08-23 DIAGNOSIS — G4733 Obstructive sleep apnea (adult) (pediatric): Secondary | ICD-10-CM | POA: Diagnosis not present

## 2011-08-23 DIAGNOSIS — I509 Heart failure, unspecified: Secondary | ICD-10-CM | POA: Diagnosis not present

## 2011-08-23 DIAGNOSIS — L8994 Pressure ulcer of unspecified site, stage 4: Secondary | ICD-10-CM | POA: Diagnosis not present

## 2011-08-26 DIAGNOSIS — Z430 Encounter for attention to tracheostomy: Secondary | ICD-10-CM | POA: Diagnosis not present

## 2011-08-26 DIAGNOSIS — L8994 Pressure ulcer of unspecified site, stage 4: Secondary | ICD-10-CM | POA: Diagnosis not present

## 2011-08-26 DIAGNOSIS — G4733 Obstructive sleep apnea (adult) (pediatric): Secondary | ICD-10-CM | POA: Diagnosis not present

## 2011-08-26 DIAGNOSIS — I509 Heart failure, unspecified: Secondary | ICD-10-CM | POA: Diagnosis not present

## 2011-08-26 DIAGNOSIS — L89609 Pressure ulcer of unspecified heel, unspecified stage: Secondary | ICD-10-CM | POA: Diagnosis not present

## 2011-08-26 DIAGNOSIS — J449 Chronic obstructive pulmonary disease, unspecified: Secondary | ICD-10-CM | POA: Diagnosis not present

## 2011-08-27 DIAGNOSIS — L89609 Pressure ulcer of unspecified heel, unspecified stage: Secondary | ICD-10-CM | POA: Diagnosis not present

## 2011-08-27 DIAGNOSIS — G4733 Obstructive sleep apnea (adult) (pediatric): Secondary | ICD-10-CM | POA: Diagnosis not present

## 2011-08-27 DIAGNOSIS — Z430 Encounter for attention to tracheostomy: Secondary | ICD-10-CM | POA: Diagnosis not present

## 2011-08-27 DIAGNOSIS — J449 Chronic obstructive pulmonary disease, unspecified: Secondary | ICD-10-CM | POA: Diagnosis not present

## 2011-08-27 DIAGNOSIS — L8994 Pressure ulcer of unspecified site, stage 4: Secondary | ICD-10-CM | POA: Diagnosis not present

## 2011-08-27 DIAGNOSIS — I509 Heart failure, unspecified: Secondary | ICD-10-CM | POA: Diagnosis not present

## 2011-08-28 DIAGNOSIS — G4733 Obstructive sleep apnea (adult) (pediatric): Secondary | ICD-10-CM | POA: Diagnosis not present

## 2011-08-28 DIAGNOSIS — L8994 Pressure ulcer of unspecified site, stage 4: Secondary | ICD-10-CM | POA: Diagnosis not present

## 2011-08-28 DIAGNOSIS — I509 Heart failure, unspecified: Secondary | ICD-10-CM | POA: Diagnosis not present

## 2011-08-28 DIAGNOSIS — J449 Chronic obstructive pulmonary disease, unspecified: Secondary | ICD-10-CM | POA: Diagnosis not present

## 2011-08-28 DIAGNOSIS — Z430 Encounter for attention to tracheostomy: Secondary | ICD-10-CM | POA: Diagnosis not present

## 2011-08-28 DIAGNOSIS — L89609 Pressure ulcer of unspecified heel, unspecified stage: Secondary | ICD-10-CM | POA: Diagnosis not present

## 2011-08-29 DIAGNOSIS — L8994 Pressure ulcer of unspecified site, stage 4: Secondary | ICD-10-CM | POA: Diagnosis not present

## 2011-08-29 DIAGNOSIS — J449 Chronic obstructive pulmonary disease, unspecified: Secondary | ICD-10-CM | POA: Diagnosis not present

## 2011-08-29 DIAGNOSIS — L89609 Pressure ulcer of unspecified heel, unspecified stage: Secondary | ICD-10-CM | POA: Diagnosis not present

## 2011-08-29 DIAGNOSIS — G4733 Obstructive sleep apnea (adult) (pediatric): Secondary | ICD-10-CM | POA: Diagnosis not present

## 2011-08-29 DIAGNOSIS — I509 Heart failure, unspecified: Secondary | ICD-10-CM | POA: Diagnosis not present

## 2011-08-29 DIAGNOSIS — Z430 Encounter for attention to tracheostomy: Secondary | ICD-10-CM | POA: Diagnosis not present

## 2011-08-30 DIAGNOSIS — L8994 Pressure ulcer of unspecified site, stage 4: Secondary | ICD-10-CM | POA: Diagnosis not present

## 2011-08-30 DIAGNOSIS — M5430 Sciatica, unspecified side: Secondary | ICD-10-CM | POA: Diagnosis not present

## 2011-08-30 DIAGNOSIS — M545 Low back pain, unspecified: Secondary | ICD-10-CM | POA: Diagnosis not present

## 2011-08-30 DIAGNOSIS — G541 Lumbosacral plexus disorders: Secondary | ICD-10-CM | POA: Diagnosis not present

## 2011-08-30 DIAGNOSIS — R42 Dizziness and giddiness: Secondary | ICD-10-CM | POA: Diagnosis not present

## 2011-08-30 DIAGNOSIS — S335XXA Sprain of ligaments of lumbar spine, initial encounter: Secondary | ICD-10-CM | POA: Diagnosis not present

## 2011-08-30 DIAGNOSIS — Z430 Encounter for attention to tracheostomy: Secondary | ICD-10-CM | POA: Diagnosis not present

## 2011-08-30 DIAGNOSIS — L89609 Pressure ulcer of unspecified heel, unspecified stage: Secondary | ICD-10-CM | POA: Diagnosis not present

## 2011-08-30 DIAGNOSIS — M533 Sacrococcygeal disorders, not elsewhere classified: Secondary | ICD-10-CM | POA: Diagnosis not present

## 2011-08-30 DIAGNOSIS — R269 Unspecified abnormalities of gait and mobility: Secondary | ICD-10-CM | POA: Diagnosis not present

## 2011-08-30 DIAGNOSIS — G4733 Obstructive sleep apnea (adult) (pediatric): Secondary | ICD-10-CM | POA: Diagnosis not present

## 2011-08-30 DIAGNOSIS — I509 Heart failure, unspecified: Secondary | ICD-10-CM | POA: Diagnosis not present

## 2011-08-30 DIAGNOSIS — H81399 Other peripheral vertigo, unspecified ear: Secondary | ICD-10-CM | POA: Diagnosis not present

## 2011-08-30 DIAGNOSIS — J449 Chronic obstructive pulmonary disease, unspecified: Secondary | ICD-10-CM | POA: Diagnosis not present

## 2011-08-30 DIAGNOSIS — Z9181 History of falling: Secondary | ICD-10-CM | POA: Diagnosis not present

## 2011-09-01 DIAGNOSIS — M542 Cervicalgia: Secondary | ICD-10-CM | POA: Diagnosis not present

## 2011-09-01 DIAGNOSIS — Z79899 Other long term (current) drug therapy: Secondary | ICD-10-CM | POA: Diagnosis not present

## 2011-09-02 DIAGNOSIS — I509 Heart failure, unspecified: Secondary | ICD-10-CM | POA: Diagnosis not present

## 2011-09-02 DIAGNOSIS — Z430 Encounter for attention to tracheostomy: Secondary | ICD-10-CM | POA: Diagnosis not present

## 2011-09-02 DIAGNOSIS — G4733 Obstructive sleep apnea (adult) (pediatric): Secondary | ICD-10-CM | POA: Diagnosis not present

## 2011-09-02 DIAGNOSIS — J449 Chronic obstructive pulmonary disease, unspecified: Secondary | ICD-10-CM | POA: Diagnosis not present

## 2011-09-02 DIAGNOSIS — L8994 Pressure ulcer of unspecified site, stage 4: Secondary | ICD-10-CM | POA: Diagnosis not present

## 2011-09-02 DIAGNOSIS — L89609 Pressure ulcer of unspecified heel, unspecified stage: Secondary | ICD-10-CM | POA: Diagnosis not present

## 2011-09-04 DIAGNOSIS — Z430 Encounter for attention to tracheostomy: Secondary | ICD-10-CM | POA: Diagnosis not present

## 2011-09-04 DIAGNOSIS — G4733 Obstructive sleep apnea (adult) (pediatric): Secondary | ICD-10-CM | POA: Diagnosis not present

## 2011-09-04 DIAGNOSIS — J449 Chronic obstructive pulmonary disease, unspecified: Secondary | ICD-10-CM | POA: Diagnosis not present

## 2011-09-04 DIAGNOSIS — L89609 Pressure ulcer of unspecified heel, unspecified stage: Secondary | ICD-10-CM | POA: Diagnosis not present

## 2011-09-04 DIAGNOSIS — I509 Heart failure, unspecified: Secondary | ICD-10-CM | POA: Diagnosis not present

## 2011-09-04 DIAGNOSIS — L8994 Pressure ulcer of unspecified site, stage 4: Secondary | ICD-10-CM | POA: Diagnosis not present

## 2011-09-06 DIAGNOSIS — J449 Chronic obstructive pulmonary disease, unspecified: Secondary | ICD-10-CM | POA: Diagnosis not present

## 2011-09-06 DIAGNOSIS — I509 Heart failure, unspecified: Secondary | ICD-10-CM | POA: Diagnosis not present

## 2011-09-06 DIAGNOSIS — G4733 Obstructive sleep apnea (adult) (pediatric): Secondary | ICD-10-CM | POA: Diagnosis not present

## 2011-09-06 DIAGNOSIS — L89609 Pressure ulcer of unspecified heel, unspecified stage: Secondary | ICD-10-CM | POA: Diagnosis not present

## 2011-09-06 DIAGNOSIS — L8994 Pressure ulcer of unspecified site, stage 4: Secondary | ICD-10-CM | POA: Diagnosis not present

## 2011-09-06 DIAGNOSIS — Z430 Encounter for attention to tracheostomy: Secondary | ICD-10-CM | POA: Diagnosis not present

## 2011-09-09 DIAGNOSIS — L89609 Pressure ulcer of unspecified heel, unspecified stage: Secondary | ICD-10-CM | POA: Diagnosis not present

## 2011-09-09 DIAGNOSIS — I509 Heart failure, unspecified: Secondary | ICD-10-CM | POA: Diagnosis not present

## 2011-09-09 DIAGNOSIS — Z430 Encounter for attention to tracheostomy: Secondary | ICD-10-CM | POA: Diagnosis not present

## 2011-09-09 DIAGNOSIS — L8994 Pressure ulcer of unspecified site, stage 4: Secondary | ICD-10-CM | POA: Diagnosis not present

## 2011-09-09 DIAGNOSIS — G4733 Obstructive sleep apnea (adult) (pediatric): Secondary | ICD-10-CM | POA: Diagnosis not present

## 2011-09-09 DIAGNOSIS — J449 Chronic obstructive pulmonary disease, unspecified: Secondary | ICD-10-CM | POA: Diagnosis not present

## 2011-09-10 DIAGNOSIS — R0609 Other forms of dyspnea: Secondary | ICD-10-CM | POA: Diagnosis not present

## 2011-09-10 DIAGNOSIS — R0989 Other specified symptoms and signs involving the circulatory and respiratory systems: Secondary | ICD-10-CM | POA: Diagnosis not present

## 2011-09-11 DIAGNOSIS — G894 Chronic pain syndrome: Secondary | ICD-10-CM | POA: Diagnosis not present

## 2011-09-11 DIAGNOSIS — G4733 Obstructive sleep apnea (adult) (pediatric): Secondary | ICD-10-CM | POA: Diagnosis not present

## 2011-09-11 DIAGNOSIS — L0231 Cutaneous abscess of buttock: Secondary | ICD-10-CM | POA: Diagnosis not present

## 2011-09-11 DIAGNOSIS — L8994 Pressure ulcer of unspecified site, stage 4: Secondary | ICD-10-CM | POA: Diagnosis not present

## 2011-09-11 DIAGNOSIS — L89609 Pressure ulcer of unspecified heel, unspecified stage: Secondary | ICD-10-CM | POA: Diagnosis not present

## 2011-09-11 DIAGNOSIS — J449 Chronic obstructive pulmonary disease, unspecified: Secondary | ICD-10-CM | POA: Diagnosis not present

## 2011-09-11 DIAGNOSIS — Z430 Encounter for attention to tracheostomy: Secondary | ICD-10-CM | POA: Diagnosis not present

## 2011-09-11 DIAGNOSIS — I509 Heart failure, unspecified: Secondary | ICD-10-CM | POA: Diagnosis not present

## 2011-09-11 DIAGNOSIS — J9600 Acute respiratory failure, unspecified whether with hypoxia or hypercapnia: Secondary | ICD-10-CM | POA: Diagnosis not present

## 2011-09-11 DIAGNOSIS — I10 Essential (primary) hypertension: Secondary | ICD-10-CM | POA: Diagnosis not present

## 2011-09-13 DIAGNOSIS — I509 Heart failure, unspecified: Secondary | ICD-10-CM | POA: Diagnosis not present

## 2011-09-13 DIAGNOSIS — L8994 Pressure ulcer of unspecified site, stage 4: Secondary | ICD-10-CM | POA: Diagnosis not present

## 2011-09-13 DIAGNOSIS — J449 Chronic obstructive pulmonary disease, unspecified: Secondary | ICD-10-CM | POA: Diagnosis not present

## 2011-09-13 DIAGNOSIS — G4733 Obstructive sleep apnea (adult) (pediatric): Secondary | ICD-10-CM | POA: Diagnosis not present

## 2011-09-13 DIAGNOSIS — Z430 Encounter for attention to tracheostomy: Secondary | ICD-10-CM | POA: Diagnosis not present

## 2011-09-13 DIAGNOSIS — L89609 Pressure ulcer of unspecified heel, unspecified stage: Secondary | ICD-10-CM | POA: Diagnosis not present

## 2011-09-16 DIAGNOSIS — J449 Chronic obstructive pulmonary disease, unspecified: Secondary | ICD-10-CM | POA: Diagnosis not present

## 2011-09-16 DIAGNOSIS — I509 Heart failure, unspecified: Secondary | ICD-10-CM | POA: Diagnosis not present

## 2011-09-16 DIAGNOSIS — Z430 Encounter for attention to tracheostomy: Secondary | ICD-10-CM | POA: Diagnosis not present

## 2011-09-16 DIAGNOSIS — L8994 Pressure ulcer of unspecified site, stage 4: Secondary | ICD-10-CM | POA: Diagnosis not present

## 2011-09-16 DIAGNOSIS — L89609 Pressure ulcer of unspecified heel, unspecified stage: Secondary | ICD-10-CM | POA: Diagnosis not present

## 2011-09-16 DIAGNOSIS — G4733 Obstructive sleep apnea (adult) (pediatric): Secondary | ICD-10-CM | POA: Diagnosis not present

## 2011-09-18 DIAGNOSIS — L89609 Pressure ulcer of unspecified heel, unspecified stage: Secondary | ICD-10-CM | POA: Diagnosis not present

## 2011-09-18 DIAGNOSIS — G4733 Obstructive sleep apnea (adult) (pediatric): Secondary | ICD-10-CM | POA: Diagnosis not present

## 2011-09-18 DIAGNOSIS — J449 Chronic obstructive pulmonary disease, unspecified: Secondary | ICD-10-CM | POA: Diagnosis not present

## 2011-09-18 DIAGNOSIS — I509 Heart failure, unspecified: Secondary | ICD-10-CM | POA: Diagnosis not present

## 2011-09-18 DIAGNOSIS — Z430 Encounter for attention to tracheostomy: Secondary | ICD-10-CM | POA: Diagnosis not present

## 2011-09-18 DIAGNOSIS — L8994 Pressure ulcer of unspecified site, stage 4: Secondary | ICD-10-CM | POA: Diagnosis not present

## 2011-09-20 DIAGNOSIS — L8994 Pressure ulcer of unspecified site, stage 4: Secondary | ICD-10-CM | POA: Diagnosis not present

## 2011-09-20 DIAGNOSIS — J449 Chronic obstructive pulmonary disease, unspecified: Secondary | ICD-10-CM | POA: Diagnosis not present

## 2011-09-20 DIAGNOSIS — L89609 Pressure ulcer of unspecified heel, unspecified stage: Secondary | ICD-10-CM | POA: Diagnosis not present

## 2011-09-20 DIAGNOSIS — Z430 Encounter for attention to tracheostomy: Secondary | ICD-10-CM | POA: Diagnosis not present

## 2011-09-20 DIAGNOSIS — I509 Heart failure, unspecified: Secondary | ICD-10-CM | POA: Diagnosis not present

## 2011-09-20 DIAGNOSIS — G4733 Obstructive sleep apnea (adult) (pediatric): Secondary | ICD-10-CM | POA: Diagnosis not present

## 2011-09-23 DIAGNOSIS — Z430 Encounter for attention to tracheostomy: Secondary | ICD-10-CM | POA: Diagnosis not present

## 2011-09-23 DIAGNOSIS — J449 Chronic obstructive pulmonary disease, unspecified: Secondary | ICD-10-CM | POA: Diagnosis not present

## 2011-09-23 DIAGNOSIS — L8994 Pressure ulcer of unspecified site, stage 4: Secondary | ICD-10-CM | POA: Diagnosis not present

## 2011-09-23 DIAGNOSIS — L89609 Pressure ulcer of unspecified heel, unspecified stage: Secondary | ICD-10-CM | POA: Diagnosis not present

## 2011-09-23 DIAGNOSIS — G4733 Obstructive sleep apnea (adult) (pediatric): Secondary | ICD-10-CM | POA: Diagnosis not present

## 2011-09-23 DIAGNOSIS — I509 Heart failure, unspecified: Secondary | ICD-10-CM | POA: Diagnosis not present

## 2011-09-24 DIAGNOSIS — Z430 Encounter for attention to tracheostomy: Secondary | ICD-10-CM | POA: Diagnosis not present

## 2011-09-24 DIAGNOSIS — G4733 Obstructive sleep apnea (adult) (pediatric): Secondary | ICD-10-CM | POA: Diagnosis not present

## 2011-09-24 DIAGNOSIS — I509 Heart failure, unspecified: Secondary | ICD-10-CM | POA: Diagnosis not present

## 2011-09-24 DIAGNOSIS — L89609 Pressure ulcer of unspecified heel, unspecified stage: Secondary | ICD-10-CM | POA: Diagnosis not present

## 2011-09-24 DIAGNOSIS — J449 Chronic obstructive pulmonary disease, unspecified: Secondary | ICD-10-CM | POA: Diagnosis not present

## 2011-09-24 DIAGNOSIS — L8994 Pressure ulcer of unspecified site, stage 4: Secondary | ICD-10-CM | POA: Diagnosis not present

## 2011-09-25 DIAGNOSIS — I509 Heart failure, unspecified: Secondary | ICD-10-CM | POA: Diagnosis not present

## 2011-09-25 DIAGNOSIS — G4733 Obstructive sleep apnea (adult) (pediatric): Secondary | ICD-10-CM | POA: Diagnosis not present

## 2011-09-25 DIAGNOSIS — Z430 Encounter for attention to tracheostomy: Secondary | ICD-10-CM | POA: Diagnosis not present

## 2011-09-25 DIAGNOSIS — L8994 Pressure ulcer of unspecified site, stage 4: Secondary | ICD-10-CM | POA: Diagnosis not present

## 2011-09-25 DIAGNOSIS — J449 Chronic obstructive pulmonary disease, unspecified: Secondary | ICD-10-CM | POA: Diagnosis not present

## 2011-09-25 DIAGNOSIS — L89609 Pressure ulcer of unspecified heel, unspecified stage: Secondary | ICD-10-CM | POA: Diagnosis not present

## 2011-09-26 DIAGNOSIS — L89609 Pressure ulcer of unspecified heel, unspecified stage: Secondary | ICD-10-CM | POA: Diagnosis not present

## 2011-09-26 DIAGNOSIS — J449 Chronic obstructive pulmonary disease, unspecified: Secondary | ICD-10-CM | POA: Diagnosis not present

## 2011-09-26 DIAGNOSIS — L8994 Pressure ulcer of unspecified site, stage 4: Secondary | ICD-10-CM | POA: Diagnosis not present

## 2011-09-26 DIAGNOSIS — I509 Heart failure, unspecified: Secondary | ICD-10-CM | POA: Diagnosis not present

## 2011-09-26 DIAGNOSIS — G4733 Obstructive sleep apnea (adult) (pediatric): Secondary | ICD-10-CM | POA: Diagnosis not present

## 2011-09-26 DIAGNOSIS — Z430 Encounter for attention to tracheostomy: Secondary | ICD-10-CM | POA: Diagnosis not present

## 2011-09-27 DIAGNOSIS — M545 Low back pain, unspecified: Secondary | ICD-10-CM | POA: Diagnosis not present

## 2011-09-27 DIAGNOSIS — I509 Heart failure, unspecified: Secondary | ICD-10-CM | POA: Diagnosis not present

## 2011-09-27 DIAGNOSIS — L8994 Pressure ulcer of unspecified site, stage 4: Secondary | ICD-10-CM | POA: Diagnosis not present

## 2011-09-27 DIAGNOSIS — H81399 Other peripheral vertigo, unspecified ear: Secondary | ICD-10-CM | POA: Diagnosis not present

## 2011-09-27 DIAGNOSIS — Z430 Encounter for attention to tracheostomy: Secondary | ICD-10-CM | POA: Diagnosis not present

## 2011-09-27 DIAGNOSIS — J449 Chronic obstructive pulmonary disease, unspecified: Secondary | ICD-10-CM | POA: Diagnosis not present

## 2011-09-27 DIAGNOSIS — Z79899 Other long term (current) drug therapy: Secondary | ICD-10-CM | POA: Diagnosis not present

## 2011-09-27 DIAGNOSIS — L89609 Pressure ulcer of unspecified heel, unspecified stage: Secondary | ICD-10-CM | POA: Diagnosis not present

## 2011-09-27 DIAGNOSIS — G4733 Obstructive sleep apnea (adult) (pediatric): Secondary | ICD-10-CM | POA: Diagnosis not present

## 2011-09-27 DIAGNOSIS — M5430 Sciatica, unspecified side: Secondary | ICD-10-CM | POA: Diagnosis not present

## 2011-09-27 DIAGNOSIS — G541 Lumbosacral plexus disorders: Secondary | ICD-10-CM | POA: Diagnosis not present

## 2011-09-30 DIAGNOSIS — I509 Heart failure, unspecified: Secondary | ICD-10-CM | POA: Diagnosis not present

## 2011-09-30 DIAGNOSIS — L8994 Pressure ulcer of unspecified site, stage 4: Secondary | ICD-10-CM | POA: Diagnosis not present

## 2011-09-30 DIAGNOSIS — J449 Chronic obstructive pulmonary disease, unspecified: Secondary | ICD-10-CM | POA: Diagnosis not present

## 2011-09-30 DIAGNOSIS — L89609 Pressure ulcer of unspecified heel, unspecified stage: Secondary | ICD-10-CM | POA: Diagnosis not present

## 2011-09-30 DIAGNOSIS — Z430 Encounter for attention to tracheostomy: Secondary | ICD-10-CM | POA: Diagnosis not present

## 2011-09-30 DIAGNOSIS — G4733 Obstructive sleep apnea (adult) (pediatric): Secondary | ICD-10-CM | POA: Diagnosis not present

## 2011-10-02 DIAGNOSIS — J449 Chronic obstructive pulmonary disease, unspecified: Secondary | ICD-10-CM | POA: Diagnosis not present

## 2011-10-02 DIAGNOSIS — Z430 Encounter for attention to tracheostomy: Secondary | ICD-10-CM | POA: Diagnosis not present

## 2011-10-02 DIAGNOSIS — G4733 Obstructive sleep apnea (adult) (pediatric): Secondary | ICD-10-CM | POA: Diagnosis not present

## 2011-10-02 DIAGNOSIS — L89609 Pressure ulcer of unspecified heel, unspecified stage: Secondary | ICD-10-CM | POA: Diagnosis not present

## 2011-10-02 DIAGNOSIS — L8994 Pressure ulcer of unspecified site, stage 4: Secondary | ICD-10-CM | POA: Diagnosis not present

## 2011-10-02 DIAGNOSIS — I509 Heart failure, unspecified: Secondary | ICD-10-CM | POA: Diagnosis not present

## 2011-10-04 DIAGNOSIS — J449 Chronic obstructive pulmonary disease, unspecified: Secondary | ICD-10-CM | POA: Diagnosis not present

## 2011-10-04 DIAGNOSIS — L89609 Pressure ulcer of unspecified heel, unspecified stage: Secondary | ICD-10-CM | POA: Diagnosis not present

## 2011-10-04 DIAGNOSIS — I509 Heart failure, unspecified: Secondary | ICD-10-CM | POA: Diagnosis not present

## 2011-10-04 DIAGNOSIS — L8994 Pressure ulcer of unspecified site, stage 4: Secondary | ICD-10-CM | POA: Diagnosis not present

## 2011-10-04 DIAGNOSIS — Z430 Encounter for attention to tracheostomy: Secondary | ICD-10-CM | POA: Diagnosis not present

## 2011-10-04 DIAGNOSIS — G4733 Obstructive sleep apnea (adult) (pediatric): Secondary | ICD-10-CM | POA: Diagnosis not present

## 2011-10-07 DIAGNOSIS — G4733 Obstructive sleep apnea (adult) (pediatric): Secondary | ICD-10-CM | POA: Diagnosis not present

## 2011-10-07 DIAGNOSIS — L8994 Pressure ulcer of unspecified site, stage 4: Secondary | ICD-10-CM | POA: Diagnosis not present

## 2011-10-07 DIAGNOSIS — I509 Heart failure, unspecified: Secondary | ICD-10-CM | POA: Diagnosis not present

## 2011-10-07 DIAGNOSIS — Z430 Encounter for attention to tracheostomy: Secondary | ICD-10-CM | POA: Diagnosis not present

## 2011-10-07 DIAGNOSIS — L89609 Pressure ulcer of unspecified heel, unspecified stage: Secondary | ICD-10-CM | POA: Diagnosis not present

## 2011-10-07 DIAGNOSIS — J449 Chronic obstructive pulmonary disease, unspecified: Secondary | ICD-10-CM | POA: Diagnosis not present

## 2011-10-08 DIAGNOSIS — G4733 Obstructive sleep apnea (adult) (pediatric): Secondary | ICD-10-CM | POA: Diagnosis not present

## 2011-10-08 DIAGNOSIS — L89609 Pressure ulcer of unspecified heel, unspecified stage: Secondary | ICD-10-CM | POA: Diagnosis not present

## 2011-10-08 DIAGNOSIS — Z430 Encounter for attention to tracheostomy: Secondary | ICD-10-CM | POA: Diagnosis not present

## 2011-10-08 DIAGNOSIS — J449 Chronic obstructive pulmonary disease, unspecified: Secondary | ICD-10-CM | POA: Diagnosis not present

## 2011-10-08 DIAGNOSIS — I509 Heart failure, unspecified: Secondary | ICD-10-CM | POA: Diagnosis not present

## 2011-10-08 DIAGNOSIS — L8994 Pressure ulcer of unspecified site, stage 4: Secondary | ICD-10-CM | POA: Diagnosis not present

## 2011-10-09 DIAGNOSIS — G4733 Obstructive sleep apnea (adult) (pediatric): Secondary | ICD-10-CM | POA: Diagnosis not present

## 2011-10-09 DIAGNOSIS — Z430 Encounter for attention to tracheostomy: Secondary | ICD-10-CM | POA: Diagnosis not present

## 2011-10-09 DIAGNOSIS — L89609 Pressure ulcer of unspecified heel, unspecified stage: Secondary | ICD-10-CM | POA: Diagnosis not present

## 2011-10-09 DIAGNOSIS — L8994 Pressure ulcer of unspecified site, stage 4: Secondary | ICD-10-CM | POA: Diagnosis not present

## 2011-10-09 DIAGNOSIS — J449 Chronic obstructive pulmonary disease, unspecified: Secondary | ICD-10-CM | POA: Diagnosis not present

## 2011-10-09 DIAGNOSIS — I509 Heart failure, unspecified: Secondary | ICD-10-CM | POA: Diagnosis not present

## 2011-10-11 DIAGNOSIS — G4733 Obstructive sleep apnea (adult) (pediatric): Secondary | ICD-10-CM | POA: Diagnosis not present

## 2011-10-11 DIAGNOSIS — L89609 Pressure ulcer of unspecified heel, unspecified stage: Secondary | ICD-10-CM | POA: Diagnosis not present

## 2011-10-11 DIAGNOSIS — I509 Heart failure, unspecified: Secondary | ICD-10-CM | POA: Diagnosis not present

## 2011-10-11 DIAGNOSIS — J449 Chronic obstructive pulmonary disease, unspecified: Secondary | ICD-10-CM | POA: Diagnosis not present

## 2011-10-11 DIAGNOSIS — L8994 Pressure ulcer of unspecified site, stage 4: Secondary | ICD-10-CM | POA: Diagnosis not present

## 2011-10-11 DIAGNOSIS — Z430 Encounter for attention to tracheostomy: Secondary | ICD-10-CM | POA: Diagnosis not present

## 2011-10-13 DIAGNOSIS — L8994 Pressure ulcer of unspecified site, stage 4: Secondary | ICD-10-CM | POA: Diagnosis not present

## 2011-10-13 DIAGNOSIS — J449 Chronic obstructive pulmonary disease, unspecified: Secondary | ICD-10-CM | POA: Diagnosis not present

## 2011-10-13 DIAGNOSIS — G4733 Obstructive sleep apnea (adult) (pediatric): Secondary | ICD-10-CM | POA: Diagnosis not present

## 2011-10-13 DIAGNOSIS — Z430 Encounter for attention to tracheostomy: Secondary | ICD-10-CM | POA: Diagnosis not present

## 2011-10-13 DIAGNOSIS — L89609 Pressure ulcer of unspecified heel, unspecified stage: Secondary | ICD-10-CM | POA: Diagnosis not present

## 2011-10-13 DIAGNOSIS — I509 Heart failure, unspecified: Secondary | ICD-10-CM | POA: Diagnosis not present

## 2011-10-14 DIAGNOSIS — L89609 Pressure ulcer of unspecified heel, unspecified stage: Secondary | ICD-10-CM | POA: Diagnosis not present

## 2011-10-14 DIAGNOSIS — L8994 Pressure ulcer of unspecified site, stage 4: Secondary | ICD-10-CM | POA: Diagnosis not present

## 2011-10-14 DIAGNOSIS — J449 Chronic obstructive pulmonary disease, unspecified: Secondary | ICD-10-CM | POA: Diagnosis not present

## 2011-10-14 DIAGNOSIS — I509 Heart failure, unspecified: Secondary | ICD-10-CM | POA: Diagnosis not present

## 2011-10-14 DIAGNOSIS — G4733 Obstructive sleep apnea (adult) (pediatric): Secondary | ICD-10-CM | POA: Diagnosis not present

## 2011-10-14 DIAGNOSIS — Z430 Encounter for attention to tracheostomy: Secondary | ICD-10-CM | POA: Diagnosis not present

## 2011-10-16 DIAGNOSIS — G4733 Obstructive sleep apnea (adult) (pediatric): Secondary | ICD-10-CM | POA: Diagnosis not present

## 2011-10-16 DIAGNOSIS — I509 Heart failure, unspecified: Secondary | ICD-10-CM | POA: Diagnosis not present

## 2011-10-16 DIAGNOSIS — J449 Chronic obstructive pulmonary disease, unspecified: Secondary | ICD-10-CM | POA: Diagnosis not present

## 2011-10-16 DIAGNOSIS — L8994 Pressure ulcer of unspecified site, stage 4: Secondary | ICD-10-CM | POA: Diagnosis not present

## 2011-10-16 DIAGNOSIS — Z430 Encounter for attention to tracheostomy: Secondary | ICD-10-CM | POA: Diagnosis not present

## 2011-10-16 DIAGNOSIS — L89609 Pressure ulcer of unspecified heel, unspecified stage: Secondary | ICD-10-CM | POA: Diagnosis not present

## 2011-10-18 DIAGNOSIS — L89609 Pressure ulcer of unspecified heel, unspecified stage: Secondary | ICD-10-CM | POA: Diagnosis not present

## 2011-10-18 DIAGNOSIS — G4733 Obstructive sleep apnea (adult) (pediatric): Secondary | ICD-10-CM | POA: Diagnosis not present

## 2011-10-18 DIAGNOSIS — L8994 Pressure ulcer of unspecified site, stage 4: Secondary | ICD-10-CM | POA: Diagnosis not present

## 2011-10-18 DIAGNOSIS — I509 Heart failure, unspecified: Secondary | ICD-10-CM | POA: Diagnosis not present

## 2011-10-18 DIAGNOSIS — J449 Chronic obstructive pulmonary disease, unspecified: Secondary | ICD-10-CM | POA: Diagnosis not present

## 2011-10-18 DIAGNOSIS — Z430 Encounter for attention to tracheostomy: Secondary | ICD-10-CM | POA: Diagnosis not present

## 2011-10-19 DIAGNOSIS — Z430 Encounter for attention to tracheostomy: Secondary | ICD-10-CM | POA: Diagnosis not present

## 2011-10-19 DIAGNOSIS — J449 Chronic obstructive pulmonary disease, unspecified: Secondary | ICD-10-CM | POA: Diagnosis not present

## 2011-10-19 DIAGNOSIS — G4733 Obstructive sleep apnea (adult) (pediatric): Secondary | ICD-10-CM | POA: Diagnosis not present

## 2011-10-19 DIAGNOSIS — L89609 Pressure ulcer of unspecified heel, unspecified stage: Secondary | ICD-10-CM | POA: Diagnosis not present

## 2011-10-19 DIAGNOSIS — I509 Heart failure, unspecified: Secondary | ICD-10-CM | POA: Diagnosis not present

## 2011-10-19 DIAGNOSIS — L8994 Pressure ulcer of unspecified site, stage 4: Secondary | ICD-10-CM | POA: Diagnosis not present

## 2011-10-21 DIAGNOSIS — Z430 Encounter for attention to tracheostomy: Secondary | ICD-10-CM | POA: Diagnosis not present

## 2011-10-21 DIAGNOSIS — J449 Chronic obstructive pulmonary disease, unspecified: Secondary | ICD-10-CM | POA: Diagnosis not present

## 2011-10-21 DIAGNOSIS — G4733 Obstructive sleep apnea (adult) (pediatric): Secondary | ICD-10-CM | POA: Diagnosis not present

## 2011-10-21 DIAGNOSIS — L89609 Pressure ulcer of unspecified heel, unspecified stage: Secondary | ICD-10-CM | POA: Diagnosis not present

## 2011-10-21 DIAGNOSIS — I509 Heart failure, unspecified: Secondary | ICD-10-CM | POA: Diagnosis not present

## 2011-10-21 DIAGNOSIS — L8994 Pressure ulcer of unspecified site, stage 4: Secondary | ICD-10-CM | POA: Diagnosis not present

## 2011-10-23 DIAGNOSIS — E662 Morbid (severe) obesity with alveolar hypoventilation: Secondary | ICD-10-CM | POA: Diagnosis not present

## 2011-10-23 DIAGNOSIS — G4733 Obstructive sleep apnea (adult) (pediatric): Secondary | ICD-10-CM | POA: Diagnosis not present

## 2011-10-25 DIAGNOSIS — G4733 Obstructive sleep apnea (adult) (pediatric): Secondary | ICD-10-CM | POA: Diagnosis not present

## 2011-10-25 DIAGNOSIS — Z430 Encounter for attention to tracheostomy: Secondary | ICD-10-CM | POA: Diagnosis not present

## 2011-10-25 DIAGNOSIS — L89609 Pressure ulcer of unspecified heel, unspecified stage: Secondary | ICD-10-CM | POA: Diagnosis not present

## 2011-10-25 DIAGNOSIS — I509 Heart failure, unspecified: Secondary | ICD-10-CM | POA: Diagnosis not present

## 2011-10-25 DIAGNOSIS — L8994 Pressure ulcer of unspecified site, stage 4: Secondary | ICD-10-CM | POA: Diagnosis not present

## 2011-10-25 DIAGNOSIS — J449 Chronic obstructive pulmonary disease, unspecified: Secondary | ICD-10-CM | POA: Diagnosis not present

## 2011-10-28 DIAGNOSIS — M545 Low back pain, unspecified: Secondary | ICD-10-CM | POA: Diagnosis not present

## 2011-10-28 DIAGNOSIS — G541 Lumbosacral plexus disorders: Secondary | ICD-10-CM | POA: Diagnosis not present

## 2011-10-28 DIAGNOSIS — Z79899 Other long term (current) drug therapy: Secondary | ICD-10-CM | POA: Diagnosis not present

## 2011-10-28 DIAGNOSIS — H81399 Other peripheral vertigo, unspecified ear: Secondary | ICD-10-CM | POA: Diagnosis not present

## 2011-10-28 DIAGNOSIS — M542 Cervicalgia: Secondary | ICD-10-CM | POA: Diagnosis not present

## 2011-10-28 DIAGNOSIS — M5430 Sciatica, unspecified side: Secondary | ICD-10-CM | POA: Diagnosis not present

## 2011-10-30 DIAGNOSIS — J449 Chronic obstructive pulmonary disease, unspecified: Secondary | ICD-10-CM | POA: Diagnosis not present

## 2011-10-30 DIAGNOSIS — I509 Heart failure, unspecified: Secondary | ICD-10-CM | POA: Diagnosis not present

## 2011-10-30 DIAGNOSIS — G4733 Obstructive sleep apnea (adult) (pediatric): Secondary | ICD-10-CM | POA: Diagnosis not present

## 2011-10-30 DIAGNOSIS — Z430 Encounter for attention to tracheostomy: Secondary | ICD-10-CM | POA: Diagnosis not present

## 2011-10-30 DIAGNOSIS — L8994 Pressure ulcer of unspecified site, stage 4: Secondary | ICD-10-CM | POA: Diagnosis not present

## 2011-10-30 DIAGNOSIS — L89609 Pressure ulcer of unspecified heel, unspecified stage: Secondary | ICD-10-CM | POA: Diagnosis not present

## 2011-11-04 ENCOUNTER — Telehealth (INDEPENDENT_AMBULATORY_CARE_PROVIDER_SITE_OTHER): Payer: Self-pay | Admitting: General Surgery

## 2011-11-04 DIAGNOSIS — I10 Essential (primary) hypertension: Secondary | ICD-10-CM | POA: Diagnosis not present

## 2011-11-04 DIAGNOSIS — E669 Obesity, unspecified: Secondary | ICD-10-CM | POA: Diagnosis not present

## 2011-11-04 DIAGNOSIS — L0231 Cutaneous abscess of buttock: Secondary | ICD-10-CM | POA: Diagnosis not present

## 2011-11-04 DIAGNOSIS — G894 Chronic pain syndrome: Secondary | ICD-10-CM | POA: Diagnosis not present

## 2011-11-04 DIAGNOSIS — L03317 Cellulitis of buttock: Secondary | ICD-10-CM | POA: Diagnosis not present

## 2011-11-04 NOTE — Telephone Encounter (Signed)
TAMMY WITH [facility] CALLED TO NOTIFY DR. Magnus Ivan THAT SHE WAS NOT ABLE TO DELIVER CARE TO [facility] TODAY DUE TO PATIENT NOT AVAILABLE. THEY WILL TRY AGAIN EITHER TOMORROW OR Wednesday/GY/ [phone] [facility])

## 2011-11-05 DIAGNOSIS — L8994 Pressure ulcer of unspecified site, stage 4: Secondary | ICD-10-CM | POA: Diagnosis not present

## 2011-11-05 DIAGNOSIS — I509 Heart failure, unspecified: Secondary | ICD-10-CM | POA: Diagnosis not present

## 2011-11-05 DIAGNOSIS — Z430 Encounter for attention to tracheostomy: Secondary | ICD-10-CM | POA: Diagnosis not present

## 2011-11-05 DIAGNOSIS — L89609 Pressure ulcer of unspecified heel, unspecified stage: Secondary | ICD-10-CM | POA: Diagnosis not present

## 2011-11-05 DIAGNOSIS — J449 Chronic obstructive pulmonary disease, unspecified: Secondary | ICD-10-CM | POA: Diagnosis not present

## 2011-11-05 DIAGNOSIS — G4733 Obstructive sleep apnea (adult) (pediatric): Secondary | ICD-10-CM | POA: Diagnosis not present

## 2011-11-07 DIAGNOSIS — R262 Difficulty in walking, not elsewhere classified: Secondary | ICD-10-CM | POA: Diagnosis not present

## 2011-11-07 DIAGNOSIS — I10 Essential (primary) hypertension: Secondary | ICD-10-CM | POA: Diagnosis not present

## 2011-11-07 DIAGNOSIS — L8994 Pressure ulcer of unspecified site, stage 4: Secondary | ICD-10-CM | POA: Diagnosis not present

## 2011-11-07 DIAGNOSIS — J9610 Chronic respiratory failure, unspecified whether with hypoxia or hypercapnia: Secondary | ICD-10-CM | POA: Diagnosis not present

## 2011-11-07 DIAGNOSIS — E669 Obesity, unspecified: Secondary | ICD-10-CM | POA: Diagnosis not present

## 2011-11-07 DIAGNOSIS — L89309 Pressure ulcer of unspecified buttock, unspecified stage: Secondary | ICD-10-CM | POA: Diagnosis not present

## 2011-11-07 DIAGNOSIS — Z430 Encounter for attention to tracheostomy: Secondary | ICD-10-CM | POA: Diagnosis not present

## 2011-11-09 DIAGNOSIS — Z430 Encounter for attention to tracheostomy: Secondary | ICD-10-CM | POA: Diagnosis not present

## 2011-11-09 DIAGNOSIS — L89309 Pressure ulcer of unspecified buttock, unspecified stage: Secondary | ICD-10-CM | POA: Diagnosis not present

## 2011-11-09 DIAGNOSIS — L8994 Pressure ulcer of unspecified site, stage 4: Secondary | ICD-10-CM | POA: Diagnosis not present

## 2011-11-09 DIAGNOSIS — I10 Essential (primary) hypertension: Secondary | ICD-10-CM | POA: Diagnosis not present

## 2011-11-09 DIAGNOSIS — J9610 Chronic respiratory failure, unspecified whether with hypoxia or hypercapnia: Secondary | ICD-10-CM | POA: Diagnosis not present

## 2011-11-09 DIAGNOSIS — R262 Difficulty in walking, not elsewhere classified: Secondary | ICD-10-CM | POA: Diagnosis not present

## 2011-11-11 DIAGNOSIS — J9610 Chronic respiratory failure, unspecified whether with hypoxia or hypercapnia: Secondary | ICD-10-CM | POA: Diagnosis not present

## 2011-11-11 DIAGNOSIS — I10 Essential (primary) hypertension: Secondary | ICD-10-CM | POA: Diagnosis not present

## 2011-11-11 DIAGNOSIS — R262 Difficulty in walking, not elsewhere classified: Secondary | ICD-10-CM | POA: Diagnosis not present

## 2011-11-11 DIAGNOSIS — Z430 Encounter for attention to tracheostomy: Secondary | ICD-10-CM | POA: Diagnosis not present

## 2011-11-11 DIAGNOSIS — L8994 Pressure ulcer of unspecified site, stage 4: Secondary | ICD-10-CM | POA: Diagnosis not present

## 2011-11-11 DIAGNOSIS — L89309 Pressure ulcer of unspecified buttock, unspecified stage: Secondary | ICD-10-CM | POA: Diagnosis not present

## 2011-11-13 DIAGNOSIS — L8994 Pressure ulcer of unspecified site, stage 4: Secondary | ICD-10-CM | POA: Diagnosis not present

## 2011-11-13 DIAGNOSIS — Z430 Encounter for attention to tracheostomy: Secondary | ICD-10-CM | POA: Diagnosis not present

## 2011-11-13 DIAGNOSIS — I10 Essential (primary) hypertension: Secondary | ICD-10-CM | POA: Diagnosis not present

## 2011-11-13 DIAGNOSIS — L89309 Pressure ulcer of unspecified buttock, unspecified stage: Secondary | ICD-10-CM | POA: Diagnosis not present

## 2011-11-13 DIAGNOSIS — R262 Difficulty in walking, not elsewhere classified: Secondary | ICD-10-CM | POA: Diagnosis not present

## 2011-11-13 DIAGNOSIS — J9610 Chronic respiratory failure, unspecified whether with hypoxia or hypercapnia: Secondary | ICD-10-CM | POA: Diagnosis not present

## 2011-11-15 DIAGNOSIS — R262 Difficulty in walking, not elsewhere classified: Secondary | ICD-10-CM | POA: Diagnosis not present

## 2011-11-15 DIAGNOSIS — Z430 Encounter for attention to tracheostomy: Secondary | ICD-10-CM | POA: Diagnosis not present

## 2011-11-15 DIAGNOSIS — L8994 Pressure ulcer of unspecified site, stage 4: Secondary | ICD-10-CM | POA: Diagnosis not present

## 2011-11-15 DIAGNOSIS — I10 Essential (primary) hypertension: Secondary | ICD-10-CM | POA: Diagnosis not present

## 2011-11-15 DIAGNOSIS — L89309 Pressure ulcer of unspecified buttock, unspecified stage: Secondary | ICD-10-CM | POA: Diagnosis not present

## 2011-11-15 DIAGNOSIS — J9610 Chronic respiratory failure, unspecified whether with hypoxia or hypercapnia: Secondary | ICD-10-CM | POA: Diagnosis not present

## 2011-11-18 DIAGNOSIS — J9610 Chronic respiratory failure, unspecified whether with hypoxia or hypercapnia: Secondary | ICD-10-CM | POA: Diagnosis not present

## 2011-11-18 DIAGNOSIS — L89309 Pressure ulcer of unspecified buttock, unspecified stage: Secondary | ICD-10-CM | POA: Diagnosis not present

## 2011-11-18 DIAGNOSIS — L8994 Pressure ulcer of unspecified site, stage 4: Secondary | ICD-10-CM | POA: Diagnosis not present

## 2011-11-18 DIAGNOSIS — I10 Essential (primary) hypertension: Secondary | ICD-10-CM | POA: Diagnosis not present

## 2011-11-18 DIAGNOSIS — Z430 Encounter for attention to tracheostomy: Secondary | ICD-10-CM | POA: Diagnosis not present

## 2011-11-18 DIAGNOSIS — R262 Difficulty in walking, not elsewhere classified: Secondary | ICD-10-CM | POA: Diagnosis not present

## 2011-11-20 DIAGNOSIS — I10 Essential (primary) hypertension: Secondary | ICD-10-CM | POA: Diagnosis not present

## 2011-11-20 DIAGNOSIS — J9610 Chronic respiratory failure, unspecified whether with hypoxia or hypercapnia: Secondary | ICD-10-CM | POA: Diagnosis not present

## 2011-11-20 DIAGNOSIS — Z430 Encounter for attention to tracheostomy: Secondary | ICD-10-CM | POA: Diagnosis not present

## 2011-11-20 DIAGNOSIS — L89309 Pressure ulcer of unspecified buttock, unspecified stage: Secondary | ICD-10-CM | POA: Diagnosis not present

## 2011-11-20 DIAGNOSIS — R262 Difficulty in walking, not elsewhere classified: Secondary | ICD-10-CM | POA: Diagnosis not present

## 2011-11-20 DIAGNOSIS — L8994 Pressure ulcer of unspecified site, stage 4: Secondary | ICD-10-CM | POA: Diagnosis not present

## 2011-11-22 DIAGNOSIS — L89309 Pressure ulcer of unspecified buttock, unspecified stage: Secondary | ICD-10-CM | POA: Diagnosis not present

## 2011-11-22 DIAGNOSIS — I10 Essential (primary) hypertension: Secondary | ICD-10-CM | POA: Diagnosis not present

## 2011-11-22 DIAGNOSIS — J9610 Chronic respiratory failure, unspecified whether with hypoxia or hypercapnia: Secondary | ICD-10-CM | POA: Diagnosis not present

## 2011-11-22 DIAGNOSIS — L8994 Pressure ulcer of unspecified site, stage 4: Secondary | ICD-10-CM | POA: Diagnosis not present

## 2011-11-22 DIAGNOSIS — Z430 Encounter for attention to tracheostomy: Secondary | ICD-10-CM | POA: Diagnosis not present

## 2011-11-22 DIAGNOSIS — R262 Difficulty in walking, not elsewhere classified: Secondary | ICD-10-CM | POA: Diagnosis not present

## 2011-11-25 DIAGNOSIS — R262 Difficulty in walking, not elsewhere classified: Secondary | ICD-10-CM | POA: Diagnosis not present

## 2011-11-25 DIAGNOSIS — L8994 Pressure ulcer of unspecified site, stage 4: Secondary | ICD-10-CM | POA: Diagnosis not present

## 2011-11-25 DIAGNOSIS — I10 Essential (primary) hypertension: Secondary | ICD-10-CM | POA: Diagnosis not present

## 2011-11-25 DIAGNOSIS — Z430 Encounter for attention to tracheostomy: Secondary | ICD-10-CM | POA: Diagnosis not present

## 2011-11-25 DIAGNOSIS — J9610 Chronic respiratory failure, unspecified whether with hypoxia or hypercapnia: Secondary | ICD-10-CM | POA: Diagnosis not present

## 2011-11-25 DIAGNOSIS — L89309 Pressure ulcer of unspecified buttock, unspecified stage: Secondary | ICD-10-CM | POA: Diagnosis not present

## 2011-11-26 DIAGNOSIS — M5430 Sciatica, unspecified side: Secondary | ICD-10-CM | POA: Diagnosis not present

## 2011-11-26 DIAGNOSIS — G541 Lumbosacral plexus disorders: Secondary | ICD-10-CM | POA: Diagnosis not present

## 2011-11-26 DIAGNOSIS — M545 Low back pain, unspecified: Secondary | ICD-10-CM | POA: Diagnosis not present

## 2011-11-26 DIAGNOSIS — M542 Cervicalgia: Secondary | ICD-10-CM | POA: Diagnosis not present

## 2011-11-26 DIAGNOSIS — Z79899 Other long term (current) drug therapy: Secondary | ICD-10-CM | POA: Diagnosis not present

## 2011-11-26 DIAGNOSIS — H81399 Other peripheral vertigo, unspecified ear: Secondary | ICD-10-CM | POA: Diagnosis not present

## 2011-11-28 DIAGNOSIS — R262 Difficulty in walking, not elsewhere classified: Secondary | ICD-10-CM | POA: Diagnosis not present

## 2011-11-28 DIAGNOSIS — J9610 Chronic respiratory failure, unspecified whether with hypoxia or hypercapnia: Secondary | ICD-10-CM | POA: Diagnosis not present

## 2011-11-28 DIAGNOSIS — L8994 Pressure ulcer of unspecified site, stage 4: Secondary | ICD-10-CM | POA: Diagnosis not present

## 2011-11-28 DIAGNOSIS — L89309 Pressure ulcer of unspecified buttock, unspecified stage: Secondary | ICD-10-CM | POA: Diagnosis not present

## 2011-11-28 DIAGNOSIS — I10 Essential (primary) hypertension: Secondary | ICD-10-CM | POA: Diagnosis not present

## 2011-11-28 DIAGNOSIS — Z430 Encounter for attention to tracheostomy: Secondary | ICD-10-CM | POA: Diagnosis not present

## 2011-12-16 DIAGNOSIS — L0231 Cutaneous abscess of buttock: Secondary | ICD-10-CM | POA: Diagnosis not present

## 2011-12-16 DIAGNOSIS — G894 Chronic pain syndrome: Secondary | ICD-10-CM | POA: Diagnosis not present

## 2011-12-16 DIAGNOSIS — I10 Essential (primary) hypertension: Secondary | ICD-10-CM | POA: Diagnosis not present

## 2011-12-16 DIAGNOSIS — J9600 Acute respiratory failure, unspecified whether with hypoxia or hypercapnia: Secondary | ICD-10-CM | POA: Diagnosis not present

## 2011-12-17 ENCOUNTER — Encounter (HOSPITAL_BASED_OUTPATIENT_CLINIC_OR_DEPARTMENT_OTHER): Payer: Medicare Other | Attending: General Surgery

## 2011-12-17 DIAGNOSIS — I10 Essential (primary) hypertension: Secondary | ICD-10-CM | POA: Diagnosis not present

## 2011-12-17 DIAGNOSIS — Z79899 Other long term (current) drug therapy: Secondary | ICD-10-CM | POA: Insufficient documentation

## 2011-12-17 DIAGNOSIS — L8994 Pressure ulcer of unspecified site, stage 4: Secondary | ICD-10-CM | POA: Diagnosis not present

## 2011-12-17 DIAGNOSIS — L89109 Pressure ulcer of unspecified part of back, unspecified stage: Secondary | ICD-10-CM | POA: Insufficient documentation

## 2011-12-17 NOTE — H&P (Signed)
[name], DOBROWSKI NO.:  [id number]  MEDICAL RECORD NO.:  [id number]  LOCATION:  FOOT                         FACILITY:  [facility]  PHYSICIAN:  Joanne Gavel, M.D.        DATE OF BIRTH:  Sep 12, 1965  DATE OF ADMISSION:  12/17/2011 DATE OF DISCHARGE:                             HISTORY & PHYSICAL   CHIEF COMPLAINT:  Decubitus ulcer, presacrum.  HISTORY OF PRESENT ILLNESS:  This obese male had an episode of severe sleep apnea resulting in respiratory failure, tracheostomy, and prolonged hospitalization, during which time he developed a large presacral decubitus.  According to his history was in the hospital from September 2012 to April 2013.  In addition to tracheostomy which was done last November, he has had 3 debridements of his decubitus wound. He is now living at home with a nurse and outpatient treatment.  The nurse tells me that the wound has markedly improved and has continued to improve over the last few weeks.  Other medical conditions include hypertension, severe obesity.  He smoked until the time of his tracheostomy.  Alcohol none.  MEDICATIONS:  Klor-Con, losartan, amlodipine, Colace and furosemide.  ALLERGIES:  TRAMADOL causes tongue swelling.  REVIEW OF SYSTEMS:  As above.  PHYSICAL EXAMINATION:  VITAL SIGNS:  Temperature 98.2, pulse 82, respirations 22, blood pressure 136/86.  Weight is 382 pounds per patient.  Height is 5 feet 7 inches. GENERAL:  There is tracheostomy in place. CHEST:  Clear. HEART:  Regular rhythm. ABDOMEN:  Not examined. EXTREMITIES:  Examination of sacral area reveals a 1.7 x 4.5 superficial wound.  This is quite clean, but it is embedded in several deep folds and areas of scar tissue where previous healing has taken place.  IMPRESSION:  Stage IV decubitus, now mostly healed.  We will start with Puracol 3 times a week and see if healing continues.  I will see him in 7 days.     Joanne Gavel, M.D.     RA/MEDQ  D:   12/17/2011  T:  12/17/2011  Job:  [id number]

## 2011-12-24 DIAGNOSIS — L8994 Pressure ulcer of unspecified site, stage 4: Secondary | ICD-10-CM | POA: Diagnosis not present

## 2011-12-24 DIAGNOSIS — Z79899 Other long term (current) drug therapy: Secondary | ICD-10-CM | POA: Diagnosis not present

## 2011-12-24 DIAGNOSIS — L89109 Pressure ulcer of unspecified part of back, unspecified stage: Secondary | ICD-10-CM | POA: Diagnosis not present

## 2011-12-24 DIAGNOSIS — I10 Essential (primary) hypertension: Secondary | ICD-10-CM | POA: Diagnosis not present

## 2011-12-27 DIAGNOSIS — I10 Essential (primary) hypertension: Secondary | ICD-10-CM | POA: Diagnosis not present

## 2011-12-27 DIAGNOSIS — Z930 Tracheostomy status: Secondary | ICD-10-CM | POA: Diagnosis not present

## 2011-12-27 DIAGNOSIS — T07XXXA Unspecified multiple injuries, initial encounter: Secondary | ICD-10-CM | POA: Diagnosis not present

## 2011-12-27 DIAGNOSIS — L8992 Pressure ulcer of unspecified site, stage 2: Secondary | ICD-10-CM | POA: Diagnosis not present

## 2011-12-27 DIAGNOSIS — R7309 Other abnormal glucose: Secondary | ICD-10-CM | POA: Diagnosis not present

## 2011-12-27 DIAGNOSIS — L89309 Pressure ulcer of unspecified buttock, unspecified stage: Secondary | ICD-10-CM | POA: Diagnosis not present

## 2011-12-30 DIAGNOSIS — L89109 Pressure ulcer of unspecified part of back, unspecified stage: Secondary | ICD-10-CM | POA: Diagnosis not present

## 2011-12-30 DIAGNOSIS — Z79899 Other long term (current) drug therapy: Secondary | ICD-10-CM | POA: Diagnosis not present

## 2011-12-30 DIAGNOSIS — I10 Essential (primary) hypertension: Secondary | ICD-10-CM | POA: Diagnosis not present

## 2011-12-30 DIAGNOSIS — L8994 Pressure ulcer of unspecified site, stage 4: Secondary | ICD-10-CM | POA: Diagnosis not present

## 2011-12-30 NOTE — Progress Notes (Cosign Needed)
[facility]  NAME:  [name], [name] NO.:  [id number]  MEDICAL RECORD NO.:  [id number]      DATE OF BIRTH:  05-12-1965  PHYSICIAN:  Wayland Denis, DO            VISIT DATE:                                  OFFICE VISIT   The patient is a 46 year old gentleman who is referred for a second opinion for possible skin graft on his sacral area.  He had an episode where he was hospitalized in the past and had to undergo restraints at least according to the patient and as a result developed some sacral ulcers.  He then was in a nursing facility and unable to reposition himself.  He developed a stage III-IV sacral ulcer.  He is a large gentleman and therefore he had some healing noted of the more superficial portion of it but now with the crevices, it has been difficult to get those healed.  He had collagen placed in them with some improvement.  He also is at home most of the time and on his sacral area without much repositioning or offloading.  He is ambulatory.  PAST MEDICAL HISTORY:  Positive for chronic pain syndrome, respiratory failure, sleep apnea, hypertension, obesity.  He has had trach and wounds debrided.  MEDICATIONS:  Klor-Con, losartan, amlodipine, Colace, furosemide, hydrocodone.  ALLERGIES:  He is allergic to TRAMADOL.  SOCIAL HISTORY:  He is now at home.  He denies smoking and he has [place] and a nursing aide that helps around the house.  He had recent lab work, which was reviewed and shows a elevated white count at 15 but otherwise his pre-albumin is 20.1 and his hemoglobin A1c is 6.2.  REVIEW OF SYSTEMS:  Otherwise negative.  PHYSICAL EXAMINATION:  He is alert and cooperative.  He is pleasant and reasonable.  He acknowledges part of the problem with his inability where he is not shifting.  He is committed to being willing to stay off of the sacral area.  His trach is in place.  No respiratory distress. His pupils are  equal.  His heart rate is regular.  His upper extremity pulses are strong and regular bilaterally.  The wound is on the sacral area and is deemed within the crevices of the fatty tissue.  It does not appear to be infected.  Skin grafting is not an option for the location being on the sacrum and for the location within the crevices plus he needs to be committed to staying off of the area.  I recommend vitamin C, multivitamin, zinc, high-protein diet, diabetic nutrition class, offloading.  Either he should be up walking or on the couch on his side.  He also was invited to the diabetes nutrition class and he is encouraged to remain strict with his blood sugar control.  He is a candidate for a flap if conservative measures do not heal that area within the next couple of months, however, I think he will have a hard time being completely off of the area for 6 weeks, which is what would be required for a flap but will re-evaluate him and we will see him back in clinic in a week.     [employer], DO  CS/MEDQ  D:  12/30/2011  T:  12/30/2011  Job:  [id number]

## 2012-01-07 ENCOUNTER — Encounter (HOSPITAL_BASED_OUTPATIENT_CLINIC_OR_DEPARTMENT_OTHER): Payer: Medicare Other | Attending: General Surgery

## 2012-01-07 DIAGNOSIS — L89309 Pressure ulcer of unspecified buttock, unspecified stage: Secondary | ICD-10-CM | POA: Diagnosis not present

## 2012-01-07 DIAGNOSIS — L8994 Pressure ulcer of unspecified site, stage 4: Secondary | ICD-10-CM | POA: Insufficient documentation

## 2012-01-09 DIAGNOSIS — G894 Chronic pain syndrome: Secondary | ICD-10-CM | POA: Diagnosis not present

## 2012-01-09 DIAGNOSIS — M1990 Unspecified osteoarthritis, unspecified site: Secondary | ICD-10-CM | POA: Diagnosis not present

## 2012-01-09 DIAGNOSIS — IMO0001 Reserved for inherently not codable concepts without codable children: Secondary | ICD-10-CM | POA: Diagnosis not present

## 2012-01-09 DIAGNOSIS — M533 Sacrococcygeal disorders, not elsewhere classified: Secondary | ICD-10-CM | POA: Diagnosis not present

## 2012-01-09 DIAGNOSIS — Z79899 Other long term (current) drug therapy: Secondary | ICD-10-CM | POA: Diagnosis not present

## 2012-01-14 ENCOUNTER — Encounter (HOSPITAL_BASED_OUTPATIENT_CLINIC_OR_DEPARTMENT_OTHER): Payer: Medicare Other

## 2012-01-21 DIAGNOSIS — L89309 Pressure ulcer of unspecified buttock, unspecified stage: Secondary | ICD-10-CM | POA: Diagnosis not present

## 2012-01-21 DIAGNOSIS — L8994 Pressure ulcer of unspecified site, stage 4: Secondary | ICD-10-CM | POA: Diagnosis not present

## 2012-01-23 ENCOUNTER — Emergency Department (HOSPITAL_COMMUNITY): Payer: Medicare Other

## 2012-01-23 ENCOUNTER — Encounter (HOSPITAL_COMMUNITY): Payer: Self-pay | Admitting: *Deleted

## 2012-01-23 ENCOUNTER — Emergency Department (HOSPITAL_COMMUNITY)
Admission: EM | Admit: 2012-01-23 | Discharge: 2012-01-23 | Discharge status: Against Medical Advice | Payer: Medicare Other | Attending: Emergency Medicine | Admitting: Emergency Medicine

## 2012-01-23 DIAGNOSIS — G4730 Sleep apnea, unspecified: Secondary | ICD-10-CM | POA: Insufficient documentation

## 2012-01-23 DIAGNOSIS — R0609 Other forms of dyspnea: Secondary | ICD-10-CM | POA: Diagnosis not present

## 2012-01-23 DIAGNOSIS — Z79899 Other long term (current) drug therapy: Secondary | ICD-10-CM | POA: Diagnosis not present

## 2012-01-23 DIAGNOSIS — J811 Chronic pulmonary edema: Secondary | ICD-10-CM | POA: Diagnosis not present

## 2012-01-23 DIAGNOSIS — J9503 Malfunction of tracheostomy stoma: Secondary | ICD-10-CM | POA: Diagnosis not present

## 2012-01-23 DIAGNOSIS — J9500 Unspecified tracheostomy complication: Secondary | ICD-10-CM | POA: Diagnosis not present

## 2012-01-23 DIAGNOSIS — Y833 Surgical operation with formation of external stoma as the cause of abnormal reaction of the patient, or of later complication, without mention of misadventure at the time of the procedure: Secondary | ICD-10-CM | POA: Insufficient documentation

## 2012-01-23 DIAGNOSIS — R0602 Shortness of breath: Secondary | ICD-10-CM | POA: Diagnosis not present

## 2012-01-23 DIAGNOSIS — I509 Heart failure, unspecified: Secondary | ICD-10-CM | POA: Insufficient documentation

## 2012-01-23 DIAGNOSIS — J9600 Acute respiratory failure, unspecified whether with hypoxia or hypercapnia: Secondary | ICD-10-CM | POA: Diagnosis not present

## 2012-01-23 DIAGNOSIS — I10 Essential (primary) hypertension: Secondary | ICD-10-CM | POA: Diagnosis not present

## 2012-01-23 DIAGNOSIS — Z9911 Dependence on respirator [ventilator] status: Secondary | ICD-10-CM | POA: Insufficient documentation

## 2012-01-23 DIAGNOSIS — Z430 Encounter for attention to tracheostomy: Secondary | ICD-10-CM | POA: Diagnosis not present

## 2012-01-23 DIAGNOSIS — Z930 Tracheostomy status: Secondary | ICD-10-CM | POA: Diagnosis not present

## 2012-01-23 DIAGNOSIS — R0989 Other specified symptoms and signs involving the circulatory and respiratory systems: Secondary | ICD-10-CM | POA: Insufficient documentation

## 2012-01-23 HISTORY — DX: Sleep apnea, unspecified: G47.30

## 2012-01-23 LAB — CBC WITH DIFFERENTIAL/PLATELET
Basophils Absolute: 0 10*3/uL (ref 0.0–0.1)
Basophils Relative: 0 % (ref 0–1)
Eosinophils Absolute: 0.3 10*3/uL (ref 0.0–0.7)
Eosinophils Relative: 1 % (ref 0–5)
MCH: 26 pg (ref 26.0–34.0)
MCHC: 35.6 g/dL (ref 30.0–36.0)
MCV: 73.2 fL — ABNORMAL LOW (ref 78.0–100.0)
Neutrophils Relative %: 60 % (ref 43–77)
Platelets: 324 10*3/uL (ref 150–400)
RBC: 5.26 MIL/uL (ref 4.22–5.81)
RDW: 16.7 % — ABNORMAL HIGH (ref 11.5–15.5)

## 2012-01-23 LAB — POCT I-STAT, CHEM 8
Calcium, Ion: 1.19 mmol/L (ref 1.12–1.23)
Chloride: 103 mEq/L (ref 96–112)
Glucose, Bld: 115 mg/dL — ABNORMAL HIGH (ref 70–99)
HCT: 41 % (ref 39.0–52.0)
Hemoglobin: 13.9 g/dL (ref 13.0–17.0)
TCO2: 32 mmol/L (ref 0–100)

## 2012-01-23 LAB — PROTIME-INR
INR: 1.07 (ref 0.00–1.49)
Prothrombin Time: 13.8 seconds (ref 11.6–15.2)

## 2012-01-23 LAB — CG4 I-STAT (LACTIC ACID): Lactic Acid, Venous: 1 mmol/L (ref 0.5–2.2)

## 2012-01-23 MED ORDER — PIPERACILLIN-TAZOBACTAM 3.375 G IVPB 30 MIN: 3.3750 g | Freq: Once | INTRAVENOUS | Status: DC

## 2012-01-23 MED ORDER — FENTANYL CITRATE 0.05 MG/ML IJ SOLN
50.0000 ug | Freq: Once | INTRAMUSCULAR | Status: AC
  Administered 2012-01-23: 50 ug via INTRAVENOUS
  Filled 2012-01-23: qty 2

## 2012-01-23 MED ORDER — FENTANYL CITRATE 0.05 MG/ML IJ SOLN
50.0000 ug | Freq: Once | INTRAMUSCULAR | Status: AC
  Administered 2012-01-23: 50 ug via INTRAVENOUS

## 2012-01-23 MED ORDER — LIDOCAINE-EPINEPHRINE 1 %-1:100000 IJ SOLN
30.0000 mL | Freq: Once | INTRAMUSCULAR | Status: DC
  Filled 2012-01-23: qty 1

## 2012-01-23 MED ORDER — VANCOMYCIN HCL IN DEXTROSE 1-5 GM/200ML-% IV SOLN: 1000.0000 mg | Freq: Once | INTRAVENOUS | Status: DC

## 2012-01-23 NOTE — ED Notes (Addendum)
PT states he dislodged his trach at 0445 after it became plugged with mucus. Pt states he does not do trach care. Trach hanging out.  Maintaining sats presently and speaking in full sentences.  Resp arrived as pt did.  Per EMS they had to spend a lot of time convincing pt to come to ED

## 2012-01-23 NOTE — ED Notes (Signed)
Pt bm cleaned.  Pressure ulcer dry and intact with no stool.  Dr Lindie Spruce and OR RN Aram Beecham at bedside.  Await Dr Lianne Bushy.

## 2012-01-23 NOTE — ED Notes (Signed)
PCCM paged to [phone] Kyri Shader D    

## 2012-01-23 NOTE — ED Notes (Signed)
Dr Lazarus Salines at bedside.  Patient to have trach replaced.

## 2012-01-23 NOTE — ED Notes (Signed)
Dr Lazarus Salines at bedside.  RT called.

## 2012-01-23 NOTE — Progress Notes (Signed)
Called to the ED concerning a patient that pulled his trach out at home. Patient arrived with trach dislodged and occluded with a mucus plug, HR 78 RR 24 SPO2 98% on room air.. Patient refused O2 and became severely agitated with [place]. [place] removed SPO2 98%. Attempted x3 to replace trach size 6xlt and 4xlt and could not replace trach. Patient stated he pulled it out at 0440. qauze applied to closed stoma. MD at bedside. Awaiting further orders

## 2012-01-23 NOTE — ED Provider Notes (Addendum)
History     CSN: [id number]  Arrival date & time 01/23/12  4098   First MD Initiated Contact with Patient 01/23/12 0524      Chief Complaint  Patient presents with  . Respiratory Distress    trach disloged    (Consider location/radiation/quality/duration/timing/severity/associated sxs/prior treatment) Patient is a 46 y.o. male presenting with shortness of breath. The history is provided by the patient.  Shortness of Breath  The current episode started today. The onset was sudden. The problem occurs continuously. The problem has been unchanged. The problem is moderate. Nothing relieves the symptoms. Nothing aggravates the symptoms. Associated symptoms include shortness of breath. Pertinent negatives include no chest pain, no sore throat and no wheezing. There was no intake of a foreign body. He has had prior hospitalizations. He has had prior ICU admissions. He has had prior intubations. His past medical history does not include asthma. He has been behaving normally.  Patient has a trach in place and is reportedly to be on vent at night but not using same.  Had mucus plug in trach at 445 am and became anxious and pulled trach out.    Past Medical History  Diagnosis Date  . Hypertension   . CHF (congestive heart failure)   . Ventilator dependent   . Sleep apnea     Past Surgical History  Procedure Date  . Incision and drainage of wound 12/22/2010    Procedure: IRRIGATION AND DEBRIDEMENT WOUND;  Surgeon: Shelly Rubenstein, MD;  Location: [facility] OR;  Service: General;  Laterality: N/A;  patient on vent  . Wound debridement 12/21/2010    Procedure: DEBRIDEMENT WOUND;  Surgeon: Shelly Rubenstein, MD;  Location: [facility];  Service: General;  Laterality: N/A;  Sacral  . Tracheostomy tube placement     No family history on file.  History  Substance Use Topics  . Smoking status: Not on file  . Smokeless tobacco: Not on file  . Alcohol Use:       Review of Systems  HENT: Negative  for sore throat.   Respiratory: Positive for shortness of breath. Negative for wheezing.   Cardiovascular: Negative for chest pain and palpitations.  All other systems reviewed and are negative.    Allergies  Tramadol  Home Medications   Current Outpatient Rx  Name  Route  Sig  Dispense  Refill  . ACETAMINOPHEN 500 MG PO TABS   Oral   Take 1,000 mg by mouth 3 (three) times daily as needed. For pain           . BIOTENE DRY MOUTH MT LIQD   Mouth Rinse   15 mLs by Mouth Rinse route QID.   1 Bottle   0   . DOCUSATE SODIUM 100 MG PO CAPS   Oral   Take 100 mg by mouth 2 (two) times daily.           Marland Kitchen PRO-STAT 64 PO LIQD   Per Tube   Place 30 mLs into feeding tube every 4 (four) hours.   900 mL   0   . HYPROMELLOSE 2.5 % OP SOLN   Ophthalmic   Apply 1 drop to eye every 4 (four) hours as needed. For eyes          . SENNA 8.6 MG PO TABS   Oral   Take 2 tablets (17.2 mg total) by mouth 2 (two) times daily.   120 each   0     There were no  vitals taken for this visit.  Physical Exam  Constitutional: He is oriented to person, place, and time. He appears well-developed and well-nourished.  HENT:  Head: Normocephalic and atraumatic.  Mouth/Throat: Oropharynx is clear and moist.  Eyes: Conjunctivae normal are normal. Pupils are equal, round, and reactive to light.  Neck: Normal range of motion. Neck supple.  Cardiovascular: Normal rate and regular rhythm.   Pulmonary/Chest: No stridor. He has decreased breath sounds. He exhibits no tenderness.  Abdominal: Soft. Bowel sounds are normal. There is no tenderness. There is no rebound and no guarding.  Musculoskeletal: Normal range of motion.  Neurological: He is alert and oriented to person, place, and time.  Skin: Skin is warm and dry. He is not diaphoretic.  Psychiatric: His mood appears anxious.    ED Course  Procedures (including critical care time)  Labs Reviewed  POCT I-STAT, CHEM 8 - Abnormal; Notable for  the following:    Glucose, Bld 115 (*)     All other components within normal limits  PROTIME-INR  CG4 I-STAT (LACTIC ACID)  CBC WITH DIFFERENTIAL   No results found.   No diagnosis found.    MDM  Case d/w PCCM--will follow.  Re-discussed with Dr. Sung Amabile to be seen by PCCM Case d/w Dr. Lazarus Salines.  Will need metal trachs at bedside.  Dr. Lazarus Salines at the bedside    MDM Reviewed: previous chart, nursing note and vitals Interpretation: labs and x-ray Total time providing critical care: 30-74 minutes. This excludes time spent performing separately reportable procedures and services. Consults: pulmonary (ENT)    CRITICAL CARE Performed by: Jasmine Awe   Total critical care time: 60 minutes  Critical care time was exclusive of separately billable procedures and treating other patients.  Critical care was necessary to treat or prevent imminent or life-threatening deterioration.  Critical care was time spent personally by me on the following activities: development of treatment plan with patient and/or surrogate as well as nursing, discussions with consultants, evaluation of patient's response to treatment, examination of patient, obtaining history from patient or surrogate, ordering and performing treatments and interventions, ordering and review of laboratory studies, ordering and review of radiographic studies, pulse oximetry and re-evaluation of patient's condition.  To be admitted with pulmonary infection and edema.       Patient decided post change of trach to refuse further labs care and admission.  He has decision making capacity to refuse care.  He is advised that the risks of leaving are but are not limited to: death, pulmonary edema, sepsis, pneumonia and further morbidity.  He verbalizes understanding of the risks and signs AMA.  He is welcomed to return at any time  Ruffus Kamaka Smitty Cords, MD 01/23/12 [phone]

## 2012-01-23 NOTE — ED Notes (Signed)
Per resp, stoma has closed too much to place trach.  OR to be paged.

## 2012-01-23 NOTE — ED Notes (Signed)
Patient has decided to leave ama,  Refuses admission.  ptar to transport home

## 2012-01-23 NOTE — ED Notes (Signed)
PCCM paged to [phone] [name]

## 2012-01-23 NOTE — ED Notes (Signed)
Patient with successful replacement of trach

## 2012-01-23 NOTE — Consult Note (Signed)
01/23/2012 7:51 AM  Alda Lea [id number]    Pulse Rate:  [66-88] 88  (12/19 0700) Resp:  [19-29] 29  (12/19 0700) BP: (124-146)/(73-109) 131/73 mmHg (12/19 0700) SpO2:  [91 %-98 %] 98 % (12/19 0700),    No intake or output data in the 24 hours ending 01/23/12 0751  Results for orders placed during the hospital encounter of 01/23/12 (from the past 24 hour(s))  CBC WITH DIFFERENTIAL     Status: Abnormal   Collection Time   01/23/12  5:51 AM      Component Value Range   WBC 18.8 (*) 4.0 - 10.5 K/uL   RBC 5.26  4.22 - 5.81 MIL/uL   Hemoglobin 13.7  13.0 - 17.0 g/dL   HCT 40.9 (*) 81.1 - 91.4 %   MCV 73.2 (*) 78.0 - 100.0 fL   MCH 26.0  26.0 - 34.0 pg   MCHC 35.6  30.0 - 36.0 g/dL   RDW 78.2 (*) 95.6 - 21.3 %   Platelets 324  150 - 400 K/uL   Neutrophils Relative 60  43 - 77 %   Neutro Abs 11.3 (*) 1.7 - 7.7 K/uL   Lymphocytes Relative 31  12 - 46 %   Lymphs Abs 5.8 (*) 0.7 - 4.0 K/uL   Monocytes Relative 8  3 - 12 %   Monocytes Absolute 1.5 (*) 0.1 - 1.0 K/uL   Eosinophils Relative 1  0 - 5 %   Eosinophils Absolute 0.3  0.0 - 0.7 K/uL   Basophils Relative 0  0 - 1 %   Basophils Absolute 0.0  0.0 - 0.1 K/uL  PROTIME-INR     Status: Normal   Collection Time   01/23/12  5:51 AM      Component Value Range   Prothrombin Time 13.8  11.6 - 15.2 seconds   INR 1.07  0.00 - 1.49  POCT I-STAT, CHEM 8     Status: Abnormal   Collection Time   01/23/12  6:00 AM      Component Value Range   Sodium 141  135 - 145 mEq/L   Potassium 4.1  3.5 - 5.1 mEq/L   Chloride 103  96 - 112 mEq/L   BUN 17  6 - 23 mg/dL   Creatinine, Ser 0.86  0.50 - 1.35 mg/dL   Glucose, Bld 578 (*) 70 - 99 mg/dL   Calcium, Ion 4.69  6.29 - 1.23 mmol/L   TCO2 32  0 - 100 mmol/L   Hemoglobin 13.9  13.0 - 17.0 g/dL   HCT 52.8  41.3 - 24.4 %  CG4 I-STAT (LACTIC ACID)     Status: Normal   Collection Time   01/23/12  6:01 AM      Component Value Range   Lactic Acid, Venous 1.00  0.5 - 2.2 mmol/L  PRO B  NATRIURETIC PEPTIDE     Status: Normal   Collection Time   01/23/12  6:45 AM      Component Value Range   Pro B Natriuretic peptide (BNP) 10.7  0 - 125 pg/mL    SUBJECTIVE:  Trach dislodge 4 hrs ago.  ER unable to replace.    OBJECTIVE:  Stoma nearly closed.  Breathing OK per mouth  IMPRESSION:  Obesity hypoventilation syndrome. Trach dependent.  PLAN:  With informed consent, 1% xylocaine with 1:100,000 epi, 8 cc's injected around stoma.  Several minutes were allowed for this to take effect.  At an appropriate level, the patient's neck was  extended.  A lubricated #6 metal trach was placed with some effort.  He was able to move air freely through this tube.  In succession, a #7, then a #8 were placed in the same way. Finally, his 6.0 cuffless Shiley XLT (proximal) trach was placed without difficulty.   ER evaluation suggested pneumonia vs CHF.  Pt. Elected to leave AMA.  Will change trach in office q2-3 mos.  Flo Shanks

## 2012-02-03 DIAGNOSIS — I10 Essential (primary) hypertension: Secondary | ICD-10-CM | POA: Diagnosis not present

## 2012-02-03 DIAGNOSIS — L0231 Cutaneous abscess of buttock: Secondary | ICD-10-CM | POA: Diagnosis not present

## 2012-02-03 DIAGNOSIS — Z930 Tracheostomy status: Secondary | ICD-10-CM | POA: Diagnosis not present

## 2012-02-03 DIAGNOSIS — L03317 Cellulitis of buttock: Secondary | ICD-10-CM | POA: Diagnosis not present

## 2012-02-11 ENCOUNTER — Encounter (HOSPITAL_BASED_OUTPATIENT_CLINIC_OR_DEPARTMENT_OTHER): Payer: Medicare Other | Attending: General Surgery

## 2012-02-11 DIAGNOSIS — L89309 Pressure ulcer of unspecified buttock, unspecified stage: Secondary | ICD-10-CM | POA: Insufficient documentation

## 2012-02-11 DIAGNOSIS — L8994 Pressure ulcer of unspecified site, stage 4: Secondary | ICD-10-CM | POA: Insufficient documentation

## 2012-02-18 DIAGNOSIS — L8994 Pressure ulcer of unspecified site, stage 4: Secondary | ICD-10-CM | POA: Diagnosis not present

## 2012-02-18 DIAGNOSIS — L89309 Pressure ulcer of unspecified buttock, unspecified stage: Secondary | ICD-10-CM | POA: Diagnosis not present

## 2012-02-25 DIAGNOSIS — Z930 Tracheostomy status: Secondary | ICD-10-CM | POA: Diagnosis not present

## 2012-02-25 DIAGNOSIS — L8992 Pressure ulcer of unspecified site, stage 2: Secondary | ICD-10-CM | POA: Diagnosis not present

## 2012-02-25 DIAGNOSIS — L89309 Pressure ulcer of unspecified buttock, unspecified stage: Secondary | ICD-10-CM | POA: Diagnosis not present

## 2012-02-25 DIAGNOSIS — I10 Essential (primary) hypertension: Secondary | ICD-10-CM | POA: Diagnosis not present

## 2012-02-26 DIAGNOSIS — L89309 Pressure ulcer of unspecified buttock, unspecified stage: Secondary | ICD-10-CM | POA: Diagnosis not present

## 2012-02-26 DIAGNOSIS — L8992 Pressure ulcer of unspecified site, stage 2: Secondary | ICD-10-CM | POA: Diagnosis not present

## 2012-02-26 DIAGNOSIS — I10 Essential (primary) hypertension: Secondary | ICD-10-CM | POA: Diagnosis not present

## 2012-02-26 DIAGNOSIS — Z930 Tracheostomy status: Secondary | ICD-10-CM | POA: Diagnosis not present

## 2012-03-01 DIAGNOSIS — L89309 Pressure ulcer of unspecified buttock, unspecified stage: Secondary | ICD-10-CM | POA: Diagnosis not present

## 2012-03-01 DIAGNOSIS — I10 Essential (primary) hypertension: Secondary | ICD-10-CM | POA: Diagnosis not present

## 2012-03-01 DIAGNOSIS — L8992 Pressure ulcer of unspecified site, stage 2: Secondary | ICD-10-CM | POA: Diagnosis not present

## 2012-03-01 DIAGNOSIS — Z930 Tracheostomy status: Secondary | ICD-10-CM | POA: Diagnosis not present

## 2012-03-03 DIAGNOSIS — Z930 Tracheostomy status: Secondary | ICD-10-CM | POA: Diagnosis not present

## 2012-03-03 DIAGNOSIS — L8992 Pressure ulcer of unspecified site, stage 2: Secondary | ICD-10-CM | POA: Diagnosis not present

## 2012-03-03 DIAGNOSIS — I10 Essential (primary) hypertension: Secondary | ICD-10-CM | POA: Diagnosis not present

## 2012-03-03 DIAGNOSIS — L89309 Pressure ulcer of unspecified buttock, unspecified stage: Secondary | ICD-10-CM | POA: Diagnosis not present

## 2012-03-05 DIAGNOSIS — L89309 Pressure ulcer of unspecified buttock, unspecified stage: Secondary | ICD-10-CM | POA: Diagnosis not present

## 2012-03-05 DIAGNOSIS — Z930 Tracheostomy status: Secondary | ICD-10-CM | POA: Diagnosis not present

## 2012-03-05 DIAGNOSIS — I10 Essential (primary) hypertension: Secondary | ICD-10-CM | POA: Diagnosis not present

## 2012-03-05 DIAGNOSIS — L8992 Pressure ulcer of unspecified site, stage 2: Secondary | ICD-10-CM | POA: Diagnosis not present

## 2012-03-09 DIAGNOSIS — Z930 Tracheostomy status: Secondary | ICD-10-CM | POA: Diagnosis not present

## 2012-03-09 DIAGNOSIS — Z6841 Body Mass Index (BMI) 40.0 and over, adult: Secondary | ICD-10-CM | POA: Diagnosis not present

## 2012-03-09 DIAGNOSIS — L0231 Cutaneous abscess of buttock: Secondary | ICD-10-CM | POA: Diagnosis not present

## 2012-03-09 DIAGNOSIS — I10 Essential (primary) hypertension: Secondary | ICD-10-CM | POA: Diagnosis not present

## 2012-03-09 DIAGNOSIS — G894 Chronic pain syndrome: Secondary | ICD-10-CM | POA: Diagnosis not present

## 2012-03-09 DIAGNOSIS — E669 Obesity, unspecified: Secondary | ICD-10-CM | POA: Diagnosis not present

## 2012-03-09 DIAGNOSIS — L8992 Pressure ulcer of unspecified site, stage 2: Secondary | ICD-10-CM | POA: Diagnosis not present

## 2012-03-09 DIAGNOSIS — L89309 Pressure ulcer of unspecified buttock, unspecified stage: Secondary | ICD-10-CM | POA: Diagnosis not present

## 2012-03-09 DIAGNOSIS — L03317 Cellulitis of buttock: Secondary | ICD-10-CM | POA: Diagnosis not present

## 2012-03-10 ENCOUNTER — Encounter (HOSPITAL_BASED_OUTPATIENT_CLINIC_OR_DEPARTMENT_OTHER): Payer: Medicare Other | Attending: General Surgery

## 2012-03-10 DIAGNOSIS — L8994 Pressure ulcer of unspecified site, stage 4: Secondary | ICD-10-CM | POA: Insufficient documentation

## 2012-03-10 DIAGNOSIS — L89109 Pressure ulcer of unspecified part of back, unspecified stage: Secondary | ICD-10-CM | POA: Insufficient documentation

## 2012-03-12 DIAGNOSIS — L8992 Pressure ulcer of unspecified site, stage 2: Secondary | ICD-10-CM | POA: Diagnosis not present

## 2012-03-12 DIAGNOSIS — Z930 Tracheostomy status: Secondary | ICD-10-CM | POA: Diagnosis not present

## 2012-03-12 DIAGNOSIS — I10 Essential (primary) hypertension: Secondary | ICD-10-CM | POA: Diagnosis not present

## 2012-03-12 DIAGNOSIS — L89309 Pressure ulcer of unspecified buttock, unspecified stage: Secondary | ICD-10-CM | POA: Diagnosis not present

## 2012-03-13 DIAGNOSIS — L89309 Pressure ulcer of unspecified buttock, unspecified stage: Secondary | ICD-10-CM | POA: Diagnosis not present

## 2012-03-13 DIAGNOSIS — I10 Essential (primary) hypertension: Secondary | ICD-10-CM | POA: Diagnosis not present

## 2012-03-13 DIAGNOSIS — Z930 Tracheostomy status: Secondary | ICD-10-CM | POA: Diagnosis not present

## 2012-03-13 DIAGNOSIS — L8992 Pressure ulcer of unspecified site, stage 2: Secondary | ICD-10-CM | POA: Diagnosis not present

## 2012-03-16 DIAGNOSIS — Z930 Tracheostomy status: Secondary | ICD-10-CM | POA: Diagnosis not present

## 2012-03-16 DIAGNOSIS — L8992 Pressure ulcer of unspecified site, stage 2: Secondary | ICD-10-CM | POA: Diagnosis not present

## 2012-03-16 DIAGNOSIS — I10 Essential (primary) hypertension: Secondary | ICD-10-CM | POA: Diagnosis not present

## 2012-03-16 DIAGNOSIS — L89309 Pressure ulcer of unspecified buttock, unspecified stage: Secondary | ICD-10-CM | POA: Diagnosis not present

## 2012-03-18 DIAGNOSIS — L8992 Pressure ulcer of unspecified site, stage 2: Secondary | ICD-10-CM | POA: Diagnosis not present

## 2012-03-18 DIAGNOSIS — I10 Essential (primary) hypertension: Secondary | ICD-10-CM | POA: Diagnosis not present

## 2012-03-18 DIAGNOSIS — L89309 Pressure ulcer of unspecified buttock, unspecified stage: Secondary | ICD-10-CM | POA: Diagnosis not present

## 2012-03-18 DIAGNOSIS — Z930 Tracheostomy status: Secondary | ICD-10-CM | POA: Diagnosis not present

## 2012-03-21 DIAGNOSIS — L8992 Pressure ulcer of unspecified site, stage 2: Secondary | ICD-10-CM | POA: Diagnosis not present

## 2012-03-21 DIAGNOSIS — L89309 Pressure ulcer of unspecified buttock, unspecified stage: Secondary | ICD-10-CM | POA: Diagnosis not present

## 2012-03-21 DIAGNOSIS — I10 Essential (primary) hypertension: Secondary | ICD-10-CM | POA: Diagnosis not present

## 2012-03-21 DIAGNOSIS — Z930 Tracheostomy status: Secondary | ICD-10-CM | POA: Diagnosis not present

## 2012-03-23 DIAGNOSIS — I10 Essential (primary) hypertension: Secondary | ICD-10-CM | POA: Diagnosis not present

## 2012-03-23 DIAGNOSIS — L89309 Pressure ulcer of unspecified buttock, unspecified stage: Secondary | ICD-10-CM | POA: Diagnosis not present

## 2012-03-23 DIAGNOSIS — Z930 Tracheostomy status: Secondary | ICD-10-CM | POA: Diagnosis not present

## 2012-03-23 DIAGNOSIS — L8992 Pressure ulcer of unspecified site, stage 2: Secondary | ICD-10-CM | POA: Diagnosis not present

## 2012-03-27 DIAGNOSIS — Z930 Tracheostomy status: Secondary | ICD-10-CM | POA: Diagnosis not present

## 2012-03-27 DIAGNOSIS — I10 Essential (primary) hypertension: Secondary | ICD-10-CM | POA: Diagnosis not present

## 2012-03-27 DIAGNOSIS — L8992 Pressure ulcer of unspecified site, stage 2: Secondary | ICD-10-CM | POA: Diagnosis not present

## 2012-03-27 DIAGNOSIS — L89309 Pressure ulcer of unspecified buttock, unspecified stage: Secondary | ICD-10-CM | POA: Diagnosis not present

## 2012-03-30 DIAGNOSIS — I10 Essential (primary) hypertension: Secondary | ICD-10-CM | POA: Diagnosis not present

## 2012-03-30 DIAGNOSIS — L8992 Pressure ulcer of unspecified site, stage 2: Secondary | ICD-10-CM | POA: Diagnosis not present

## 2012-03-30 DIAGNOSIS — Z930 Tracheostomy status: Secondary | ICD-10-CM | POA: Diagnosis not present

## 2012-03-30 DIAGNOSIS — L89309 Pressure ulcer of unspecified buttock, unspecified stage: Secondary | ICD-10-CM | POA: Diagnosis not present

## 2012-04-01 DIAGNOSIS — I10 Essential (primary) hypertension: Secondary | ICD-10-CM | POA: Diagnosis not present

## 2012-04-01 DIAGNOSIS — L8992 Pressure ulcer of unspecified site, stage 2: Secondary | ICD-10-CM | POA: Diagnosis not present

## 2012-04-01 DIAGNOSIS — Z930 Tracheostomy status: Secondary | ICD-10-CM | POA: Diagnosis not present

## 2012-04-01 DIAGNOSIS — L89309 Pressure ulcer of unspecified buttock, unspecified stage: Secondary | ICD-10-CM | POA: Diagnosis not present

## 2012-04-03 DIAGNOSIS — Z930 Tracheostomy status: Secondary | ICD-10-CM | POA: Diagnosis not present

## 2012-04-03 DIAGNOSIS — L89309 Pressure ulcer of unspecified buttock, unspecified stage: Secondary | ICD-10-CM | POA: Diagnosis not present

## 2012-04-03 DIAGNOSIS — L8992 Pressure ulcer of unspecified site, stage 2: Secondary | ICD-10-CM | POA: Diagnosis not present

## 2012-04-03 DIAGNOSIS — I10 Essential (primary) hypertension: Secondary | ICD-10-CM | POA: Diagnosis not present

## 2012-04-08 DIAGNOSIS — I10 Essential (primary) hypertension: Secondary | ICD-10-CM | POA: Diagnosis not present

## 2012-04-08 DIAGNOSIS — E662 Morbid (severe) obesity with alveolar hypoventilation: Secondary | ICD-10-CM | POA: Diagnosis not present

## 2012-04-08 DIAGNOSIS — Z930 Tracheostomy status: Secondary | ICD-10-CM | POA: Diagnosis not present

## 2012-04-08 DIAGNOSIS — L8992 Pressure ulcer of unspecified site, stage 2: Secondary | ICD-10-CM | POA: Diagnosis not present

## 2012-04-08 DIAGNOSIS — G4733 Obstructive sleep apnea (adult) (pediatric): Secondary | ICD-10-CM | POA: Diagnosis not present

## 2012-04-08 DIAGNOSIS — L89309 Pressure ulcer of unspecified buttock, unspecified stage: Secondary | ICD-10-CM | POA: Diagnosis not present

## 2012-04-11 DIAGNOSIS — L8992 Pressure ulcer of unspecified site, stage 2: Secondary | ICD-10-CM | POA: Diagnosis not present

## 2012-04-11 DIAGNOSIS — I10 Essential (primary) hypertension: Secondary | ICD-10-CM | POA: Diagnosis not present

## 2012-04-11 DIAGNOSIS — L89309 Pressure ulcer of unspecified buttock, unspecified stage: Secondary | ICD-10-CM | POA: Diagnosis not present

## 2012-04-11 DIAGNOSIS — Z930 Tracheostomy status: Secondary | ICD-10-CM | POA: Diagnosis not present

## 2012-04-13 DIAGNOSIS — Z930 Tracheostomy status: Secondary | ICD-10-CM | POA: Diagnosis not present

## 2012-04-13 DIAGNOSIS — L89309 Pressure ulcer of unspecified buttock, unspecified stage: Secondary | ICD-10-CM | POA: Diagnosis not present

## 2012-04-13 DIAGNOSIS — I10 Essential (primary) hypertension: Secondary | ICD-10-CM | POA: Diagnosis not present

## 2012-04-13 DIAGNOSIS — L8992 Pressure ulcer of unspecified site, stage 2: Secondary | ICD-10-CM | POA: Diagnosis not present

## 2012-04-15 DIAGNOSIS — Z930 Tracheostomy status: Secondary | ICD-10-CM | POA: Diagnosis not present

## 2012-04-15 DIAGNOSIS — L8992 Pressure ulcer of unspecified site, stage 2: Secondary | ICD-10-CM | POA: Diagnosis not present

## 2012-04-15 DIAGNOSIS — L89309 Pressure ulcer of unspecified buttock, unspecified stage: Secondary | ICD-10-CM | POA: Diagnosis not present

## 2012-04-15 DIAGNOSIS — I10 Essential (primary) hypertension: Secondary | ICD-10-CM | POA: Diagnosis not present

## 2012-04-17 DIAGNOSIS — I10 Essential (primary) hypertension: Secondary | ICD-10-CM | POA: Diagnosis not present

## 2012-04-17 DIAGNOSIS — L8992 Pressure ulcer of unspecified site, stage 2: Secondary | ICD-10-CM | POA: Diagnosis not present

## 2012-04-17 DIAGNOSIS — Z930 Tracheostomy status: Secondary | ICD-10-CM | POA: Diagnosis not present

## 2012-04-17 DIAGNOSIS — L89309 Pressure ulcer of unspecified buttock, unspecified stage: Secondary | ICD-10-CM | POA: Diagnosis not present

## 2012-04-20 DIAGNOSIS — L89309 Pressure ulcer of unspecified buttock, unspecified stage: Secondary | ICD-10-CM | POA: Diagnosis not present

## 2012-04-20 DIAGNOSIS — Z930 Tracheostomy status: Secondary | ICD-10-CM | POA: Diagnosis not present

## 2012-04-20 DIAGNOSIS — L8992 Pressure ulcer of unspecified site, stage 2: Secondary | ICD-10-CM | POA: Diagnosis not present

## 2012-04-20 DIAGNOSIS — I10 Essential (primary) hypertension: Secondary | ICD-10-CM | POA: Diagnosis not present

## 2012-04-21 DIAGNOSIS — L89309 Pressure ulcer of unspecified buttock, unspecified stage: Secondary | ICD-10-CM | POA: Diagnosis not present

## 2012-04-21 DIAGNOSIS — I10 Essential (primary) hypertension: Secondary | ICD-10-CM | POA: Diagnosis not present

## 2012-04-21 DIAGNOSIS — L8992 Pressure ulcer of unspecified site, stage 2: Secondary | ICD-10-CM | POA: Diagnosis not present

## 2012-04-21 DIAGNOSIS — Z930 Tracheostomy status: Secondary | ICD-10-CM | POA: Diagnosis not present

## 2012-04-22 DIAGNOSIS — I10 Essential (primary) hypertension: Secondary | ICD-10-CM | POA: Diagnosis not present

## 2012-04-22 DIAGNOSIS — L8992 Pressure ulcer of unspecified site, stage 2: Secondary | ICD-10-CM | POA: Diagnosis not present

## 2012-04-22 DIAGNOSIS — L89309 Pressure ulcer of unspecified buttock, unspecified stage: Secondary | ICD-10-CM | POA: Diagnosis not present

## 2012-04-22 DIAGNOSIS — Z930 Tracheostomy status: Secondary | ICD-10-CM | POA: Diagnosis not present

## 2012-04-24 DIAGNOSIS — I10 Essential (primary) hypertension: Secondary | ICD-10-CM | POA: Diagnosis not present

## 2012-04-24 DIAGNOSIS — L8992 Pressure ulcer of unspecified site, stage 2: Secondary | ICD-10-CM | POA: Diagnosis not present

## 2012-04-24 DIAGNOSIS — L89309 Pressure ulcer of unspecified buttock, unspecified stage: Secondary | ICD-10-CM | POA: Diagnosis not present

## 2012-04-24 DIAGNOSIS — Z930 Tracheostomy status: Secondary | ICD-10-CM | POA: Diagnosis not present

## 2012-04-25 DIAGNOSIS — Z930 Tracheostomy status: Secondary | ICD-10-CM | POA: Diagnosis not present

## 2012-04-25 DIAGNOSIS — L8992 Pressure ulcer of unspecified site, stage 2: Secondary | ICD-10-CM | POA: Diagnosis not present

## 2012-04-25 DIAGNOSIS — I10 Essential (primary) hypertension: Secondary | ICD-10-CM | POA: Diagnosis not present

## 2012-04-25 DIAGNOSIS — L89309 Pressure ulcer of unspecified buttock, unspecified stage: Secondary | ICD-10-CM | POA: Diagnosis not present

## 2012-04-27 DIAGNOSIS — I10 Essential (primary) hypertension: Secondary | ICD-10-CM | POA: Diagnosis not present

## 2012-04-27 DIAGNOSIS — L8992 Pressure ulcer of unspecified site, stage 2: Secondary | ICD-10-CM | POA: Diagnosis not present

## 2012-04-27 DIAGNOSIS — L89309 Pressure ulcer of unspecified buttock, unspecified stage: Secondary | ICD-10-CM | POA: Diagnosis not present

## 2012-04-27 DIAGNOSIS — Z930 Tracheostomy status: Secondary | ICD-10-CM | POA: Diagnosis not present

## 2012-04-28 ENCOUNTER — Encounter (HOSPITAL_BASED_OUTPATIENT_CLINIC_OR_DEPARTMENT_OTHER): Payer: Medicare Other | Attending: General Surgery

## 2012-04-28 DIAGNOSIS — L98499 Non-pressure chronic ulcer of skin of other sites with unspecified severity: Secondary | ICD-10-CM | POA: Insufficient documentation

## 2012-04-29 DIAGNOSIS — L8992 Pressure ulcer of unspecified site, stage 2: Secondary | ICD-10-CM | POA: Diagnosis not present

## 2012-04-29 DIAGNOSIS — L89309 Pressure ulcer of unspecified buttock, unspecified stage: Secondary | ICD-10-CM | POA: Diagnosis not present

## 2012-04-29 DIAGNOSIS — I10 Essential (primary) hypertension: Secondary | ICD-10-CM | POA: Diagnosis not present

## 2012-04-29 DIAGNOSIS — Z930 Tracheostomy status: Secondary | ICD-10-CM | POA: Diagnosis not present

## 2012-05-01 DIAGNOSIS — Z930 Tracheostomy status: Secondary | ICD-10-CM | POA: Diagnosis not present

## 2012-05-01 DIAGNOSIS — I10 Essential (primary) hypertension: Secondary | ICD-10-CM | POA: Diagnosis not present

## 2012-05-01 DIAGNOSIS — L8992 Pressure ulcer of unspecified site, stage 2: Secondary | ICD-10-CM | POA: Diagnosis not present

## 2012-05-01 DIAGNOSIS — L89309 Pressure ulcer of unspecified buttock, unspecified stage: Secondary | ICD-10-CM | POA: Diagnosis not present

## 2012-05-04 DIAGNOSIS — L8992 Pressure ulcer of unspecified site, stage 2: Secondary | ICD-10-CM | POA: Diagnosis not present

## 2012-05-04 DIAGNOSIS — Z930 Tracheostomy status: Secondary | ICD-10-CM | POA: Diagnosis not present

## 2012-05-04 DIAGNOSIS — I10 Essential (primary) hypertension: Secondary | ICD-10-CM | POA: Diagnosis not present

## 2012-05-04 DIAGNOSIS — L89309 Pressure ulcer of unspecified buttock, unspecified stage: Secondary | ICD-10-CM | POA: Diagnosis not present

## 2012-05-06 DIAGNOSIS — L89309 Pressure ulcer of unspecified buttock, unspecified stage: Secondary | ICD-10-CM | POA: Diagnosis not present

## 2012-05-06 DIAGNOSIS — I10 Essential (primary) hypertension: Secondary | ICD-10-CM | POA: Diagnosis not present

## 2012-05-06 DIAGNOSIS — Z930 Tracheostomy status: Secondary | ICD-10-CM | POA: Diagnosis not present

## 2012-05-06 DIAGNOSIS — L8992 Pressure ulcer of unspecified site, stage 2: Secondary | ICD-10-CM | POA: Diagnosis not present

## 2012-05-08 DIAGNOSIS — Z930 Tracheostomy status: Secondary | ICD-10-CM | POA: Diagnosis not present

## 2012-05-08 DIAGNOSIS — L89309 Pressure ulcer of unspecified buttock, unspecified stage: Secondary | ICD-10-CM | POA: Diagnosis not present

## 2012-05-08 DIAGNOSIS — I10 Essential (primary) hypertension: Secondary | ICD-10-CM | POA: Diagnosis not present

## 2012-05-08 DIAGNOSIS — L8992 Pressure ulcer of unspecified site, stage 2: Secondary | ICD-10-CM | POA: Diagnosis not present

## 2012-05-11 DIAGNOSIS — L8992 Pressure ulcer of unspecified site, stage 2: Secondary | ICD-10-CM | POA: Diagnosis not present

## 2012-05-11 DIAGNOSIS — Z930 Tracheostomy status: Secondary | ICD-10-CM | POA: Diagnosis not present

## 2012-05-11 DIAGNOSIS — I10 Essential (primary) hypertension: Secondary | ICD-10-CM | POA: Diagnosis not present

## 2012-05-11 DIAGNOSIS — L89309 Pressure ulcer of unspecified buttock, unspecified stage: Secondary | ICD-10-CM | POA: Diagnosis not present

## 2012-05-13 DIAGNOSIS — Z930 Tracheostomy status: Secondary | ICD-10-CM | POA: Diagnosis not present

## 2012-05-13 DIAGNOSIS — I10 Essential (primary) hypertension: Secondary | ICD-10-CM | POA: Diagnosis not present

## 2012-05-13 DIAGNOSIS — L8992 Pressure ulcer of unspecified site, stage 2: Secondary | ICD-10-CM | POA: Diagnosis not present

## 2012-05-13 DIAGNOSIS — L89309 Pressure ulcer of unspecified buttock, unspecified stage: Secondary | ICD-10-CM | POA: Diagnosis not present

## 2012-05-15 DIAGNOSIS — L89309 Pressure ulcer of unspecified buttock, unspecified stage: Secondary | ICD-10-CM | POA: Diagnosis not present

## 2012-05-15 DIAGNOSIS — Z930 Tracheostomy status: Secondary | ICD-10-CM | POA: Diagnosis not present

## 2012-05-15 DIAGNOSIS — L8992 Pressure ulcer of unspecified site, stage 2: Secondary | ICD-10-CM | POA: Diagnosis not present

## 2012-05-15 DIAGNOSIS — I10 Essential (primary) hypertension: Secondary | ICD-10-CM | POA: Diagnosis not present

## 2012-05-18 DIAGNOSIS — L8992 Pressure ulcer of unspecified site, stage 2: Secondary | ICD-10-CM | POA: Diagnosis not present

## 2012-05-18 DIAGNOSIS — I10 Essential (primary) hypertension: Secondary | ICD-10-CM | POA: Diagnosis not present

## 2012-05-18 DIAGNOSIS — L89309 Pressure ulcer of unspecified buttock, unspecified stage: Secondary | ICD-10-CM | POA: Diagnosis not present

## 2012-05-18 DIAGNOSIS — Z930 Tracheostomy status: Secondary | ICD-10-CM | POA: Diagnosis not present

## 2012-05-20 DIAGNOSIS — I10 Essential (primary) hypertension: Secondary | ICD-10-CM | POA: Diagnosis not present

## 2012-05-20 DIAGNOSIS — L8992 Pressure ulcer of unspecified site, stage 2: Secondary | ICD-10-CM | POA: Diagnosis not present

## 2012-05-20 DIAGNOSIS — L89309 Pressure ulcer of unspecified buttock, unspecified stage: Secondary | ICD-10-CM | POA: Diagnosis not present

## 2012-05-20 DIAGNOSIS — Z930 Tracheostomy status: Secondary | ICD-10-CM | POA: Diagnosis not present

## 2012-05-22 DIAGNOSIS — L89309 Pressure ulcer of unspecified buttock, unspecified stage: Secondary | ICD-10-CM | POA: Diagnosis not present

## 2012-05-22 DIAGNOSIS — L8992 Pressure ulcer of unspecified site, stage 2: Secondary | ICD-10-CM | POA: Diagnosis not present

## 2012-05-22 DIAGNOSIS — Z930 Tracheostomy status: Secondary | ICD-10-CM | POA: Diagnosis not present

## 2012-05-22 DIAGNOSIS — I10 Essential (primary) hypertension: Secondary | ICD-10-CM | POA: Diagnosis not present

## 2012-05-25 DIAGNOSIS — Z930 Tracheostomy status: Secondary | ICD-10-CM | POA: Diagnosis not present

## 2012-05-25 DIAGNOSIS — L8992 Pressure ulcer of unspecified site, stage 2: Secondary | ICD-10-CM | POA: Diagnosis not present

## 2012-05-25 DIAGNOSIS — L89309 Pressure ulcer of unspecified buttock, unspecified stage: Secondary | ICD-10-CM | POA: Diagnosis not present

## 2012-05-25 DIAGNOSIS — I10 Essential (primary) hypertension: Secondary | ICD-10-CM | POA: Diagnosis not present

## 2012-05-26 ENCOUNTER — Encounter (HOSPITAL_BASED_OUTPATIENT_CLINIC_OR_DEPARTMENT_OTHER): Payer: Medicare Other | Attending: General Surgery

## 2012-05-27 DIAGNOSIS — L8992 Pressure ulcer of unspecified site, stage 2: Secondary | ICD-10-CM | POA: Diagnosis not present

## 2012-05-27 DIAGNOSIS — Z930 Tracheostomy status: Secondary | ICD-10-CM | POA: Diagnosis not present

## 2012-05-27 DIAGNOSIS — I10 Essential (primary) hypertension: Secondary | ICD-10-CM | POA: Diagnosis not present

## 2012-05-27 DIAGNOSIS — L89309 Pressure ulcer of unspecified buttock, unspecified stage: Secondary | ICD-10-CM | POA: Diagnosis not present

## 2012-05-29 DIAGNOSIS — Z930 Tracheostomy status: Secondary | ICD-10-CM | POA: Diagnosis not present

## 2012-05-29 DIAGNOSIS — L89309 Pressure ulcer of unspecified buttock, unspecified stage: Secondary | ICD-10-CM | POA: Diagnosis not present

## 2012-05-29 DIAGNOSIS — I10 Essential (primary) hypertension: Secondary | ICD-10-CM | POA: Diagnosis not present

## 2012-05-29 DIAGNOSIS — L8992 Pressure ulcer of unspecified site, stage 2: Secondary | ICD-10-CM | POA: Diagnosis not present

## 2012-06-01 DIAGNOSIS — Z930 Tracheostomy status: Secondary | ICD-10-CM | POA: Diagnosis not present

## 2012-06-01 DIAGNOSIS — L8992 Pressure ulcer of unspecified site, stage 2: Secondary | ICD-10-CM | POA: Diagnosis not present

## 2012-06-01 DIAGNOSIS — I10 Essential (primary) hypertension: Secondary | ICD-10-CM | POA: Diagnosis not present

## 2012-06-01 DIAGNOSIS — L89309 Pressure ulcer of unspecified buttock, unspecified stage: Secondary | ICD-10-CM | POA: Diagnosis not present

## 2012-06-03 DIAGNOSIS — I10 Essential (primary) hypertension: Secondary | ICD-10-CM | POA: Diagnosis not present

## 2012-06-03 DIAGNOSIS — Z930 Tracheostomy status: Secondary | ICD-10-CM | POA: Diagnosis not present

## 2012-06-03 DIAGNOSIS — L8992 Pressure ulcer of unspecified site, stage 2: Secondary | ICD-10-CM | POA: Diagnosis not present

## 2012-06-03 DIAGNOSIS — L89309 Pressure ulcer of unspecified buttock, unspecified stage: Secondary | ICD-10-CM | POA: Diagnosis not present

## 2012-06-05 DIAGNOSIS — Z930 Tracheostomy status: Secondary | ICD-10-CM | POA: Diagnosis not present

## 2012-06-05 DIAGNOSIS — L8992 Pressure ulcer of unspecified site, stage 2: Secondary | ICD-10-CM | POA: Diagnosis not present

## 2012-06-05 DIAGNOSIS — L89309 Pressure ulcer of unspecified buttock, unspecified stage: Secondary | ICD-10-CM | POA: Diagnosis not present

## 2012-06-05 DIAGNOSIS — I10 Essential (primary) hypertension: Secondary | ICD-10-CM | POA: Diagnosis not present

## 2012-06-10 DIAGNOSIS — L89309 Pressure ulcer of unspecified buttock, unspecified stage: Secondary | ICD-10-CM | POA: Diagnosis not present

## 2012-06-10 DIAGNOSIS — L8992 Pressure ulcer of unspecified site, stage 2: Secondary | ICD-10-CM | POA: Diagnosis not present

## 2012-06-10 DIAGNOSIS — I10 Essential (primary) hypertension: Secondary | ICD-10-CM | POA: Diagnosis not present

## 2012-06-10 DIAGNOSIS — Z930 Tracheostomy status: Secondary | ICD-10-CM | POA: Diagnosis not present

## 2012-06-11 DIAGNOSIS — G4733 Obstructive sleep apnea (adult) (pediatric): Secondary | ICD-10-CM | POA: Diagnosis not present

## 2012-06-11 DIAGNOSIS — E662 Morbid (severe) obesity with alveolar hypoventilation: Secondary | ICD-10-CM | POA: Diagnosis not present

## 2012-06-11 DIAGNOSIS — M7710 Lateral epicondylitis, unspecified elbow: Secondary | ICD-10-CM | POA: Diagnosis not present

## 2012-06-12 DIAGNOSIS — Z930 Tracheostomy status: Secondary | ICD-10-CM | POA: Diagnosis not present

## 2012-06-12 DIAGNOSIS — L8992 Pressure ulcer of unspecified site, stage 2: Secondary | ICD-10-CM | POA: Diagnosis not present

## 2012-06-12 DIAGNOSIS — I10 Essential (primary) hypertension: Secondary | ICD-10-CM | POA: Diagnosis not present

## 2012-06-12 DIAGNOSIS — L89309 Pressure ulcer of unspecified buttock, unspecified stage: Secondary | ICD-10-CM | POA: Diagnosis not present

## 2012-06-15 DIAGNOSIS — L89309 Pressure ulcer of unspecified buttock, unspecified stage: Secondary | ICD-10-CM | POA: Diagnosis not present

## 2012-06-15 DIAGNOSIS — I10 Essential (primary) hypertension: Secondary | ICD-10-CM | POA: Diagnosis not present

## 2012-06-15 DIAGNOSIS — L8992 Pressure ulcer of unspecified site, stage 2: Secondary | ICD-10-CM | POA: Diagnosis not present

## 2012-06-15 DIAGNOSIS — Z930 Tracheostomy status: Secondary | ICD-10-CM | POA: Diagnosis not present

## 2012-06-17 DIAGNOSIS — L8992 Pressure ulcer of unspecified site, stage 2: Secondary | ICD-10-CM | POA: Diagnosis not present

## 2012-06-17 DIAGNOSIS — I10 Essential (primary) hypertension: Secondary | ICD-10-CM | POA: Diagnosis not present

## 2012-06-17 DIAGNOSIS — L89309 Pressure ulcer of unspecified buttock, unspecified stage: Secondary | ICD-10-CM | POA: Diagnosis not present

## 2012-06-17 DIAGNOSIS — Z930 Tracheostomy status: Secondary | ICD-10-CM | POA: Diagnosis not present

## 2012-06-19 DIAGNOSIS — L8992 Pressure ulcer of unspecified site, stage 2: Secondary | ICD-10-CM | POA: Diagnosis not present

## 2012-06-19 DIAGNOSIS — Z930 Tracheostomy status: Secondary | ICD-10-CM | POA: Diagnosis not present

## 2012-06-19 DIAGNOSIS — I10 Essential (primary) hypertension: Secondary | ICD-10-CM | POA: Diagnosis not present

## 2012-06-19 DIAGNOSIS — L89309 Pressure ulcer of unspecified buttock, unspecified stage: Secondary | ICD-10-CM | POA: Diagnosis not present

## 2012-06-22 DIAGNOSIS — L89309 Pressure ulcer of unspecified buttock, unspecified stage: Secondary | ICD-10-CM | POA: Diagnosis not present

## 2012-06-22 DIAGNOSIS — Z930 Tracheostomy status: Secondary | ICD-10-CM | POA: Diagnosis not present

## 2012-06-22 DIAGNOSIS — I10 Essential (primary) hypertension: Secondary | ICD-10-CM | POA: Diagnosis not present

## 2012-06-22 DIAGNOSIS — L8992 Pressure ulcer of unspecified site, stage 2: Secondary | ICD-10-CM | POA: Diagnosis not present

## 2012-06-24 DIAGNOSIS — L8992 Pressure ulcer of unspecified site, stage 2: Secondary | ICD-10-CM | POA: Diagnosis not present

## 2012-06-24 DIAGNOSIS — L89309 Pressure ulcer of unspecified buttock, unspecified stage: Secondary | ICD-10-CM | POA: Diagnosis not present

## 2012-06-24 DIAGNOSIS — Z930 Tracheostomy status: Secondary | ICD-10-CM | POA: Diagnosis not present

## 2012-06-24 DIAGNOSIS — I10 Essential (primary) hypertension: Secondary | ICD-10-CM | POA: Diagnosis not present

## 2012-06-26 DIAGNOSIS — I10 Essential (primary) hypertension: Secondary | ICD-10-CM | POA: Diagnosis not present

## 2012-06-26 DIAGNOSIS — Z930 Tracheostomy status: Secondary | ICD-10-CM | POA: Diagnosis not present

## 2012-06-26 DIAGNOSIS — L8992 Pressure ulcer of unspecified site, stage 2: Secondary | ICD-10-CM | POA: Diagnosis not present

## 2012-06-26 DIAGNOSIS — L89309 Pressure ulcer of unspecified buttock, unspecified stage: Secondary | ICD-10-CM | POA: Diagnosis not present

## 2012-06-29 DIAGNOSIS — L8992 Pressure ulcer of unspecified site, stage 2: Secondary | ICD-10-CM | POA: Diagnosis not present

## 2012-06-29 DIAGNOSIS — L89309 Pressure ulcer of unspecified buttock, unspecified stage: Secondary | ICD-10-CM | POA: Diagnosis not present

## 2012-06-29 DIAGNOSIS — Z930 Tracheostomy status: Secondary | ICD-10-CM | POA: Diagnosis not present

## 2012-06-29 DIAGNOSIS — I10 Essential (primary) hypertension: Secondary | ICD-10-CM | POA: Diagnosis not present

## 2012-07-01 DIAGNOSIS — L8992 Pressure ulcer of unspecified site, stage 2: Secondary | ICD-10-CM | POA: Diagnosis not present

## 2012-07-01 DIAGNOSIS — Z930 Tracheostomy status: Secondary | ICD-10-CM | POA: Diagnosis not present

## 2012-07-01 DIAGNOSIS — I10 Essential (primary) hypertension: Secondary | ICD-10-CM | POA: Diagnosis not present

## 2012-07-01 DIAGNOSIS — L89309 Pressure ulcer of unspecified buttock, unspecified stage: Secondary | ICD-10-CM | POA: Diagnosis not present

## 2012-07-03 DIAGNOSIS — L89309 Pressure ulcer of unspecified buttock, unspecified stage: Secondary | ICD-10-CM | POA: Diagnosis not present

## 2012-07-03 DIAGNOSIS — I10 Essential (primary) hypertension: Secondary | ICD-10-CM | POA: Diagnosis not present

## 2012-07-03 DIAGNOSIS — Z930 Tracheostomy status: Secondary | ICD-10-CM | POA: Diagnosis not present

## 2012-07-03 DIAGNOSIS — L8992 Pressure ulcer of unspecified site, stage 2: Secondary | ICD-10-CM | POA: Diagnosis not present

## 2012-07-06 DIAGNOSIS — I10 Essential (primary) hypertension: Secondary | ICD-10-CM | POA: Diagnosis not present

## 2012-07-06 DIAGNOSIS — L8992 Pressure ulcer of unspecified site, stage 2: Secondary | ICD-10-CM | POA: Diagnosis not present

## 2012-07-06 DIAGNOSIS — Z930 Tracheostomy status: Secondary | ICD-10-CM | POA: Diagnosis not present

## 2012-07-06 DIAGNOSIS — L89309 Pressure ulcer of unspecified buttock, unspecified stage: Secondary | ICD-10-CM | POA: Diagnosis not present

## 2012-07-08 DIAGNOSIS — J209 Acute bronchitis, unspecified: Secondary | ICD-10-CM | POA: Diagnosis not present

## 2012-07-08 DIAGNOSIS — L8992 Pressure ulcer of unspecified site, stage 2: Secondary | ICD-10-CM | POA: Diagnosis not present

## 2012-07-08 DIAGNOSIS — L89309 Pressure ulcer of unspecified buttock, unspecified stage: Secondary | ICD-10-CM | POA: Diagnosis not present

## 2012-07-08 DIAGNOSIS — Z930 Tracheostomy status: Secondary | ICD-10-CM | POA: Diagnosis not present

## 2012-07-08 DIAGNOSIS — Z6841 Body Mass Index (BMI) 40.0 and over, adult: Secondary | ICD-10-CM | POA: Diagnosis not present

## 2012-07-08 DIAGNOSIS — G894 Chronic pain syndrome: Secondary | ICD-10-CM | POA: Diagnosis not present

## 2012-07-08 DIAGNOSIS — I10 Essential (primary) hypertension: Secondary | ICD-10-CM | POA: Diagnosis not present

## 2012-07-10 DIAGNOSIS — L8992 Pressure ulcer of unspecified site, stage 2: Secondary | ICD-10-CM | POA: Diagnosis not present

## 2012-07-10 DIAGNOSIS — I10 Essential (primary) hypertension: Secondary | ICD-10-CM | POA: Diagnosis not present

## 2012-07-10 DIAGNOSIS — Z930 Tracheostomy status: Secondary | ICD-10-CM | POA: Diagnosis not present

## 2012-07-10 DIAGNOSIS — L89309 Pressure ulcer of unspecified buttock, unspecified stage: Secondary | ICD-10-CM | POA: Diagnosis not present

## 2012-07-13 DIAGNOSIS — L89309 Pressure ulcer of unspecified buttock, unspecified stage: Secondary | ICD-10-CM | POA: Diagnosis not present

## 2012-07-13 DIAGNOSIS — Z930 Tracheostomy status: Secondary | ICD-10-CM | POA: Diagnosis not present

## 2012-07-13 DIAGNOSIS — L8992 Pressure ulcer of unspecified site, stage 2: Secondary | ICD-10-CM | POA: Diagnosis not present

## 2012-07-13 DIAGNOSIS — I10 Essential (primary) hypertension: Secondary | ICD-10-CM | POA: Diagnosis not present

## 2012-07-15 DIAGNOSIS — Z930 Tracheostomy status: Secondary | ICD-10-CM | POA: Diagnosis not present

## 2012-07-15 DIAGNOSIS — L8992 Pressure ulcer of unspecified site, stage 2: Secondary | ICD-10-CM | POA: Diagnosis not present

## 2012-07-15 DIAGNOSIS — L89309 Pressure ulcer of unspecified buttock, unspecified stage: Secondary | ICD-10-CM | POA: Diagnosis not present

## 2012-07-15 DIAGNOSIS — I10 Essential (primary) hypertension: Secondary | ICD-10-CM | POA: Diagnosis not present

## 2012-07-17 DIAGNOSIS — I10 Essential (primary) hypertension: Secondary | ICD-10-CM | POA: Diagnosis not present

## 2012-07-17 DIAGNOSIS — L8992 Pressure ulcer of unspecified site, stage 2: Secondary | ICD-10-CM | POA: Diagnosis not present

## 2012-07-17 DIAGNOSIS — Z930 Tracheostomy status: Secondary | ICD-10-CM | POA: Diagnosis not present

## 2012-07-17 DIAGNOSIS — L89309 Pressure ulcer of unspecified buttock, unspecified stage: Secondary | ICD-10-CM | POA: Diagnosis not present

## 2012-07-22 DIAGNOSIS — I10 Essential (primary) hypertension: Secondary | ICD-10-CM | POA: Diagnosis not present

## 2012-07-22 DIAGNOSIS — Z930 Tracheostomy status: Secondary | ICD-10-CM | POA: Diagnosis not present

## 2012-07-22 DIAGNOSIS — L89309 Pressure ulcer of unspecified buttock, unspecified stage: Secondary | ICD-10-CM | POA: Diagnosis not present

## 2012-07-22 DIAGNOSIS — L8992 Pressure ulcer of unspecified site, stage 2: Secondary | ICD-10-CM | POA: Diagnosis not present

## 2012-07-24 DIAGNOSIS — Z930 Tracheostomy status: Secondary | ICD-10-CM | POA: Diagnosis not present

## 2012-07-24 DIAGNOSIS — I10 Essential (primary) hypertension: Secondary | ICD-10-CM | POA: Diagnosis not present

## 2012-07-24 DIAGNOSIS — L8992 Pressure ulcer of unspecified site, stage 2: Secondary | ICD-10-CM | POA: Diagnosis not present

## 2012-07-24 DIAGNOSIS — L89309 Pressure ulcer of unspecified buttock, unspecified stage: Secondary | ICD-10-CM | POA: Diagnosis not present

## 2012-07-27 DIAGNOSIS — L8992 Pressure ulcer of unspecified site, stage 2: Secondary | ICD-10-CM | POA: Diagnosis not present

## 2012-07-27 DIAGNOSIS — Z930 Tracheostomy status: Secondary | ICD-10-CM | POA: Diagnosis not present

## 2012-07-27 DIAGNOSIS — L89309 Pressure ulcer of unspecified buttock, unspecified stage: Secondary | ICD-10-CM | POA: Diagnosis not present

## 2012-07-27 DIAGNOSIS — I10 Essential (primary) hypertension: Secondary | ICD-10-CM | POA: Diagnosis not present

## 2012-07-29 DIAGNOSIS — I10 Essential (primary) hypertension: Secondary | ICD-10-CM | POA: Diagnosis not present

## 2012-07-29 DIAGNOSIS — Z930 Tracheostomy status: Secondary | ICD-10-CM | POA: Diagnosis not present

## 2012-07-29 DIAGNOSIS — L89309 Pressure ulcer of unspecified buttock, unspecified stage: Secondary | ICD-10-CM | POA: Diagnosis not present

## 2012-07-29 DIAGNOSIS — L8992 Pressure ulcer of unspecified site, stage 2: Secondary | ICD-10-CM | POA: Diagnosis not present

## 2012-07-31 DIAGNOSIS — L89309 Pressure ulcer of unspecified buttock, unspecified stage: Secondary | ICD-10-CM | POA: Diagnosis not present

## 2012-07-31 DIAGNOSIS — L8992 Pressure ulcer of unspecified site, stage 2: Secondary | ICD-10-CM | POA: Diagnosis not present

## 2012-07-31 DIAGNOSIS — I10 Essential (primary) hypertension: Secondary | ICD-10-CM | POA: Diagnosis not present

## 2012-07-31 DIAGNOSIS — Z930 Tracheostomy status: Secondary | ICD-10-CM | POA: Diagnosis not present

## 2012-08-04 DIAGNOSIS — I10 Essential (primary) hypertension: Secondary | ICD-10-CM | POA: Diagnosis not present

## 2012-08-04 DIAGNOSIS — L89309 Pressure ulcer of unspecified buttock, unspecified stage: Secondary | ICD-10-CM | POA: Diagnosis not present

## 2012-08-04 DIAGNOSIS — L8992 Pressure ulcer of unspecified site, stage 2: Secondary | ICD-10-CM | POA: Diagnosis not present

## 2012-08-04 DIAGNOSIS — Z930 Tracheostomy status: Secondary | ICD-10-CM | POA: Diagnosis not present

## 2012-08-07 DIAGNOSIS — L8992 Pressure ulcer of unspecified site, stage 2: Secondary | ICD-10-CM | POA: Diagnosis not present

## 2012-08-07 DIAGNOSIS — Z930 Tracheostomy status: Secondary | ICD-10-CM | POA: Diagnosis not present

## 2012-08-07 DIAGNOSIS — I10 Essential (primary) hypertension: Secondary | ICD-10-CM | POA: Diagnosis not present

## 2012-08-07 DIAGNOSIS — L89309 Pressure ulcer of unspecified buttock, unspecified stage: Secondary | ICD-10-CM | POA: Diagnosis not present

## 2012-08-10 DIAGNOSIS — L89309 Pressure ulcer of unspecified buttock, unspecified stage: Secondary | ICD-10-CM | POA: Diagnosis not present

## 2012-08-10 DIAGNOSIS — L8992 Pressure ulcer of unspecified site, stage 2: Secondary | ICD-10-CM | POA: Diagnosis not present

## 2012-08-10 DIAGNOSIS — Z930 Tracheostomy status: Secondary | ICD-10-CM | POA: Diagnosis not present

## 2012-08-10 DIAGNOSIS — I10 Essential (primary) hypertension: Secondary | ICD-10-CM | POA: Diagnosis not present

## 2012-08-11 ENCOUNTER — Encounter (HOSPITAL_BASED_OUTPATIENT_CLINIC_OR_DEPARTMENT_OTHER): Payer: Medicare Other | Attending: General Surgery

## 2012-08-11 DIAGNOSIS — L89109 Pressure ulcer of unspecified part of back, unspecified stage: Secondary | ICD-10-CM | POA: Diagnosis not present

## 2012-08-11 DIAGNOSIS — L8993 Pressure ulcer of unspecified site, stage 3: Secondary | ICD-10-CM | POA: Insufficient documentation

## 2012-08-12 DIAGNOSIS — L8992 Pressure ulcer of unspecified site, stage 2: Secondary | ICD-10-CM | POA: Diagnosis not present

## 2012-08-12 DIAGNOSIS — I10 Essential (primary) hypertension: Secondary | ICD-10-CM | POA: Diagnosis not present

## 2012-08-12 DIAGNOSIS — L89309 Pressure ulcer of unspecified buttock, unspecified stage: Secondary | ICD-10-CM | POA: Diagnosis not present

## 2012-08-12 DIAGNOSIS — Z930 Tracheostomy status: Secondary | ICD-10-CM | POA: Diagnosis not present

## 2012-08-14 DIAGNOSIS — L89309 Pressure ulcer of unspecified buttock, unspecified stage: Secondary | ICD-10-CM | POA: Diagnosis not present

## 2012-08-14 DIAGNOSIS — Z930 Tracheostomy status: Secondary | ICD-10-CM | POA: Diagnosis not present

## 2012-08-14 DIAGNOSIS — I10 Essential (primary) hypertension: Secondary | ICD-10-CM | POA: Diagnosis not present

## 2012-08-14 DIAGNOSIS — L8992 Pressure ulcer of unspecified site, stage 2: Secondary | ICD-10-CM | POA: Diagnosis not present

## 2012-08-17 DIAGNOSIS — Z930 Tracheostomy status: Secondary | ICD-10-CM | POA: Diagnosis not present

## 2012-08-17 DIAGNOSIS — L8992 Pressure ulcer of unspecified site, stage 2: Secondary | ICD-10-CM | POA: Diagnosis not present

## 2012-08-17 DIAGNOSIS — I10 Essential (primary) hypertension: Secondary | ICD-10-CM | POA: Diagnosis not present

## 2012-08-17 DIAGNOSIS — L89309 Pressure ulcer of unspecified buttock, unspecified stage: Secondary | ICD-10-CM | POA: Diagnosis not present

## 2012-08-18 DIAGNOSIS — R0609 Other forms of dyspnea: Secondary | ICD-10-CM | POA: Diagnosis not present

## 2012-08-18 DIAGNOSIS — Z430 Encounter for attention to tracheostomy: Secondary | ICD-10-CM | POA: Diagnosis not present

## 2012-08-18 DIAGNOSIS — E662 Morbid (severe) obesity with alveolar hypoventilation: Secondary | ICD-10-CM | POA: Diagnosis not present

## 2012-08-18 DIAGNOSIS — R0989 Other specified symptoms and signs involving the circulatory and respiratory systems: Secondary | ICD-10-CM | POA: Diagnosis not present

## 2012-08-19 DIAGNOSIS — L89309 Pressure ulcer of unspecified buttock, unspecified stage: Secondary | ICD-10-CM | POA: Diagnosis not present

## 2012-08-19 DIAGNOSIS — L8992 Pressure ulcer of unspecified site, stage 2: Secondary | ICD-10-CM | POA: Diagnosis not present

## 2012-08-19 DIAGNOSIS — Z930 Tracheostomy status: Secondary | ICD-10-CM | POA: Diagnosis not present

## 2012-08-19 DIAGNOSIS — I10 Essential (primary) hypertension: Secondary | ICD-10-CM | POA: Diagnosis not present

## 2012-08-21 DIAGNOSIS — Z930 Tracheostomy status: Secondary | ICD-10-CM | POA: Diagnosis not present

## 2012-08-21 DIAGNOSIS — L8992 Pressure ulcer of unspecified site, stage 2: Secondary | ICD-10-CM | POA: Diagnosis not present

## 2012-08-21 DIAGNOSIS — I10 Essential (primary) hypertension: Secondary | ICD-10-CM | POA: Diagnosis not present

## 2012-08-21 DIAGNOSIS — L89309 Pressure ulcer of unspecified buttock, unspecified stage: Secondary | ICD-10-CM | POA: Diagnosis not present

## 2012-08-23 DIAGNOSIS — Z930 Tracheostomy status: Secondary | ICD-10-CM | POA: Diagnosis not present

## 2012-08-23 DIAGNOSIS — I10 Essential (primary) hypertension: Secondary | ICD-10-CM | POA: Diagnosis not present

## 2012-08-23 DIAGNOSIS — L89309 Pressure ulcer of unspecified buttock, unspecified stage: Secondary | ICD-10-CM | POA: Diagnosis not present

## 2012-08-23 DIAGNOSIS — L8992 Pressure ulcer of unspecified site, stage 2: Secondary | ICD-10-CM | POA: Diagnosis not present

## 2012-08-25 DIAGNOSIS — Z930 Tracheostomy status: Secondary | ICD-10-CM | POA: Diagnosis not present

## 2012-08-25 DIAGNOSIS — L8992 Pressure ulcer of unspecified site, stage 2: Secondary | ICD-10-CM | POA: Diagnosis not present

## 2012-08-25 DIAGNOSIS — I10 Essential (primary) hypertension: Secondary | ICD-10-CM | POA: Diagnosis not present

## 2012-08-25 DIAGNOSIS — L89309 Pressure ulcer of unspecified buttock, unspecified stage: Secondary | ICD-10-CM | POA: Diagnosis not present

## 2012-08-27 DIAGNOSIS — Z930 Tracheostomy status: Secondary | ICD-10-CM | POA: Diagnosis not present

## 2012-08-27 DIAGNOSIS — L89309 Pressure ulcer of unspecified buttock, unspecified stage: Secondary | ICD-10-CM | POA: Diagnosis not present

## 2012-08-27 DIAGNOSIS — L8992 Pressure ulcer of unspecified site, stage 2: Secondary | ICD-10-CM | POA: Diagnosis not present

## 2012-08-27 DIAGNOSIS — I10 Essential (primary) hypertension: Secondary | ICD-10-CM | POA: Diagnosis not present

## 2012-08-31 DIAGNOSIS — L8992 Pressure ulcer of unspecified site, stage 2: Secondary | ICD-10-CM | POA: Diagnosis not present

## 2012-08-31 DIAGNOSIS — Z930 Tracheostomy status: Secondary | ICD-10-CM | POA: Diagnosis not present

## 2012-08-31 DIAGNOSIS — L89309 Pressure ulcer of unspecified buttock, unspecified stage: Secondary | ICD-10-CM | POA: Diagnosis not present

## 2012-08-31 DIAGNOSIS — I10 Essential (primary) hypertension: Secondary | ICD-10-CM | POA: Diagnosis not present

## 2012-09-02 DIAGNOSIS — L89309 Pressure ulcer of unspecified buttock, unspecified stage: Secondary | ICD-10-CM | POA: Diagnosis not present

## 2012-09-02 DIAGNOSIS — I10 Essential (primary) hypertension: Secondary | ICD-10-CM | POA: Diagnosis not present

## 2012-09-02 DIAGNOSIS — Z930 Tracheostomy status: Secondary | ICD-10-CM | POA: Diagnosis not present

## 2012-09-02 DIAGNOSIS — L8992 Pressure ulcer of unspecified site, stage 2: Secondary | ICD-10-CM | POA: Diagnosis not present

## 2012-09-04 DIAGNOSIS — L89309 Pressure ulcer of unspecified buttock, unspecified stage: Secondary | ICD-10-CM | POA: Diagnosis not present

## 2012-09-04 DIAGNOSIS — L8992 Pressure ulcer of unspecified site, stage 2: Secondary | ICD-10-CM | POA: Diagnosis not present

## 2012-09-04 DIAGNOSIS — Z930 Tracheostomy status: Secondary | ICD-10-CM | POA: Diagnosis not present

## 2012-09-04 DIAGNOSIS — I10 Essential (primary) hypertension: Secondary | ICD-10-CM | POA: Diagnosis not present

## 2012-09-07 DIAGNOSIS — Z930 Tracheostomy status: Secondary | ICD-10-CM | POA: Diagnosis not present

## 2012-09-07 DIAGNOSIS — L8992 Pressure ulcer of unspecified site, stage 2: Secondary | ICD-10-CM | POA: Diagnosis not present

## 2012-09-07 DIAGNOSIS — I10 Essential (primary) hypertension: Secondary | ICD-10-CM | POA: Diagnosis not present

## 2012-09-07 DIAGNOSIS — L89309 Pressure ulcer of unspecified buttock, unspecified stage: Secondary | ICD-10-CM | POA: Diagnosis not present

## 2012-09-08 ENCOUNTER — Encounter (HOSPITAL_BASED_OUTPATIENT_CLINIC_OR_DEPARTMENT_OTHER): Payer: Medicare Other | Attending: General Surgery

## 2012-09-09 DIAGNOSIS — L8992 Pressure ulcer of unspecified site, stage 2: Secondary | ICD-10-CM | POA: Diagnosis not present

## 2012-09-09 DIAGNOSIS — I10 Essential (primary) hypertension: Secondary | ICD-10-CM | POA: Diagnosis not present

## 2012-09-09 DIAGNOSIS — L89309 Pressure ulcer of unspecified buttock, unspecified stage: Secondary | ICD-10-CM | POA: Diagnosis not present

## 2012-09-09 DIAGNOSIS — Z930 Tracheostomy status: Secondary | ICD-10-CM | POA: Diagnosis not present

## 2012-09-11 DIAGNOSIS — Z930 Tracheostomy status: Secondary | ICD-10-CM | POA: Diagnosis not present

## 2012-09-11 DIAGNOSIS — I10 Essential (primary) hypertension: Secondary | ICD-10-CM | POA: Diagnosis not present

## 2012-09-11 DIAGNOSIS — L89309 Pressure ulcer of unspecified buttock, unspecified stage: Secondary | ICD-10-CM | POA: Diagnosis not present

## 2012-09-11 DIAGNOSIS — L8992 Pressure ulcer of unspecified site, stage 2: Secondary | ICD-10-CM | POA: Diagnosis not present

## 2012-09-14 DIAGNOSIS — Z930 Tracheostomy status: Secondary | ICD-10-CM | POA: Diagnosis not present

## 2012-09-14 DIAGNOSIS — L8992 Pressure ulcer of unspecified site, stage 2: Secondary | ICD-10-CM | POA: Diagnosis not present

## 2012-09-14 DIAGNOSIS — I10 Essential (primary) hypertension: Secondary | ICD-10-CM | POA: Diagnosis not present

## 2012-09-14 DIAGNOSIS — L89309 Pressure ulcer of unspecified buttock, unspecified stage: Secondary | ICD-10-CM | POA: Diagnosis not present

## 2012-09-16 DIAGNOSIS — L8992 Pressure ulcer of unspecified site, stage 2: Secondary | ICD-10-CM | POA: Diagnosis not present

## 2012-09-16 DIAGNOSIS — L89309 Pressure ulcer of unspecified buttock, unspecified stage: Secondary | ICD-10-CM | POA: Diagnosis not present

## 2012-09-16 DIAGNOSIS — Z930 Tracheostomy status: Secondary | ICD-10-CM | POA: Diagnosis not present

## 2012-09-16 DIAGNOSIS — I10 Essential (primary) hypertension: Secondary | ICD-10-CM | POA: Diagnosis not present

## 2012-09-18 DIAGNOSIS — L8992 Pressure ulcer of unspecified site, stage 2: Secondary | ICD-10-CM | POA: Diagnosis not present

## 2012-09-18 DIAGNOSIS — L89309 Pressure ulcer of unspecified buttock, unspecified stage: Secondary | ICD-10-CM | POA: Diagnosis not present

## 2012-09-18 DIAGNOSIS — I10 Essential (primary) hypertension: Secondary | ICD-10-CM | POA: Diagnosis not present

## 2012-09-18 DIAGNOSIS — Z930 Tracheostomy status: Secondary | ICD-10-CM | POA: Diagnosis not present

## 2012-09-21 DIAGNOSIS — Z930 Tracheostomy status: Secondary | ICD-10-CM | POA: Diagnosis not present

## 2012-09-21 DIAGNOSIS — L8992 Pressure ulcer of unspecified site, stage 2: Secondary | ICD-10-CM | POA: Diagnosis not present

## 2012-09-21 DIAGNOSIS — I10 Essential (primary) hypertension: Secondary | ICD-10-CM | POA: Diagnosis not present

## 2012-09-21 DIAGNOSIS — L89309 Pressure ulcer of unspecified buttock, unspecified stage: Secondary | ICD-10-CM | POA: Diagnosis not present

## 2012-09-24 DIAGNOSIS — L89309 Pressure ulcer of unspecified buttock, unspecified stage: Secondary | ICD-10-CM | POA: Diagnosis not present

## 2012-09-24 DIAGNOSIS — Z930 Tracheostomy status: Secondary | ICD-10-CM | POA: Diagnosis not present

## 2012-09-24 DIAGNOSIS — L8992 Pressure ulcer of unspecified site, stage 2: Secondary | ICD-10-CM | POA: Diagnosis not present

## 2012-09-24 DIAGNOSIS — I10 Essential (primary) hypertension: Secondary | ICD-10-CM | POA: Diagnosis not present

## 2012-09-26 DIAGNOSIS — Z930 Tracheostomy status: Secondary | ICD-10-CM | POA: Diagnosis not present

## 2012-09-26 DIAGNOSIS — L8992 Pressure ulcer of unspecified site, stage 2: Secondary | ICD-10-CM | POA: Diagnosis not present

## 2012-09-26 DIAGNOSIS — I10 Essential (primary) hypertension: Secondary | ICD-10-CM | POA: Diagnosis not present

## 2012-09-26 DIAGNOSIS — L89309 Pressure ulcer of unspecified buttock, unspecified stage: Secondary | ICD-10-CM | POA: Diagnosis not present

## 2012-09-28 DIAGNOSIS — L89309 Pressure ulcer of unspecified buttock, unspecified stage: Secondary | ICD-10-CM | POA: Diagnosis not present

## 2012-09-28 DIAGNOSIS — L8992 Pressure ulcer of unspecified site, stage 2: Secondary | ICD-10-CM | POA: Diagnosis not present

## 2012-09-28 DIAGNOSIS — Z930 Tracheostomy status: Secondary | ICD-10-CM | POA: Diagnosis not present

## 2012-09-28 DIAGNOSIS — I10 Essential (primary) hypertension: Secondary | ICD-10-CM | POA: Diagnosis not present

## 2012-09-30 DIAGNOSIS — I10 Essential (primary) hypertension: Secondary | ICD-10-CM | POA: Diagnosis not present

## 2012-09-30 DIAGNOSIS — L8992 Pressure ulcer of unspecified site, stage 2: Secondary | ICD-10-CM | POA: Diagnosis not present

## 2012-09-30 DIAGNOSIS — Z930 Tracheostomy status: Secondary | ICD-10-CM | POA: Diagnosis not present

## 2012-09-30 DIAGNOSIS — L89309 Pressure ulcer of unspecified buttock, unspecified stage: Secondary | ICD-10-CM | POA: Diagnosis not present

## 2012-10-02 DIAGNOSIS — I10 Essential (primary) hypertension: Secondary | ICD-10-CM | POA: Diagnosis not present

## 2012-10-02 DIAGNOSIS — L89309 Pressure ulcer of unspecified buttock, unspecified stage: Secondary | ICD-10-CM | POA: Diagnosis not present

## 2012-10-02 DIAGNOSIS — Z930 Tracheostomy status: Secondary | ICD-10-CM | POA: Diagnosis not present

## 2012-10-02 DIAGNOSIS — L8992 Pressure ulcer of unspecified site, stage 2: Secondary | ICD-10-CM | POA: Diagnosis not present

## 2012-10-05 DIAGNOSIS — L8992 Pressure ulcer of unspecified site, stage 2: Secondary | ICD-10-CM | POA: Diagnosis not present

## 2012-10-05 DIAGNOSIS — L89309 Pressure ulcer of unspecified buttock, unspecified stage: Secondary | ICD-10-CM | POA: Diagnosis not present

## 2012-10-05 DIAGNOSIS — I10 Essential (primary) hypertension: Secondary | ICD-10-CM | POA: Diagnosis not present

## 2012-10-05 DIAGNOSIS — Z930 Tracheostomy status: Secondary | ICD-10-CM | POA: Diagnosis not present

## 2012-10-09 DIAGNOSIS — Z930 Tracheostomy status: Secondary | ICD-10-CM | POA: Diagnosis not present

## 2012-10-09 DIAGNOSIS — I10 Essential (primary) hypertension: Secondary | ICD-10-CM | POA: Diagnosis not present

## 2012-10-09 DIAGNOSIS — L8992 Pressure ulcer of unspecified site, stage 2: Secondary | ICD-10-CM | POA: Diagnosis not present

## 2012-10-09 DIAGNOSIS — L89309 Pressure ulcer of unspecified buttock, unspecified stage: Secondary | ICD-10-CM | POA: Diagnosis not present

## 2012-10-12 DIAGNOSIS — I10 Essential (primary) hypertension: Secondary | ICD-10-CM | POA: Diagnosis not present

## 2012-10-12 DIAGNOSIS — L8992 Pressure ulcer of unspecified site, stage 2: Secondary | ICD-10-CM | POA: Diagnosis not present

## 2012-10-12 DIAGNOSIS — Z930 Tracheostomy status: Secondary | ICD-10-CM | POA: Diagnosis not present

## 2012-10-12 DIAGNOSIS — L89309 Pressure ulcer of unspecified buttock, unspecified stage: Secondary | ICD-10-CM | POA: Diagnosis not present

## 2012-10-14 DIAGNOSIS — Z930 Tracheostomy status: Secondary | ICD-10-CM | POA: Diagnosis not present

## 2012-10-14 DIAGNOSIS — I10 Essential (primary) hypertension: Secondary | ICD-10-CM | POA: Diagnosis not present

## 2012-10-14 DIAGNOSIS — L89309 Pressure ulcer of unspecified buttock, unspecified stage: Secondary | ICD-10-CM | POA: Diagnosis not present

## 2012-10-14 DIAGNOSIS — L8992 Pressure ulcer of unspecified site, stage 2: Secondary | ICD-10-CM | POA: Diagnosis not present

## 2012-10-16 DIAGNOSIS — Z930 Tracheostomy status: Secondary | ICD-10-CM | POA: Diagnosis not present

## 2012-10-16 DIAGNOSIS — I10 Essential (primary) hypertension: Secondary | ICD-10-CM | POA: Diagnosis not present

## 2012-10-16 DIAGNOSIS — L8992 Pressure ulcer of unspecified site, stage 2: Secondary | ICD-10-CM | POA: Diagnosis not present

## 2012-10-16 DIAGNOSIS — L89309 Pressure ulcer of unspecified buttock, unspecified stage: Secondary | ICD-10-CM | POA: Diagnosis not present

## 2012-10-19 DIAGNOSIS — L89309 Pressure ulcer of unspecified buttock, unspecified stage: Secondary | ICD-10-CM | POA: Diagnosis not present

## 2012-10-19 DIAGNOSIS — I10 Essential (primary) hypertension: Secondary | ICD-10-CM | POA: Diagnosis not present

## 2012-10-19 DIAGNOSIS — Z930 Tracheostomy status: Secondary | ICD-10-CM | POA: Diagnosis not present

## 2012-10-19 DIAGNOSIS — L8992 Pressure ulcer of unspecified site, stage 2: Secondary | ICD-10-CM | POA: Diagnosis not present

## 2012-10-21 DIAGNOSIS — L89309 Pressure ulcer of unspecified buttock, unspecified stage: Secondary | ICD-10-CM | POA: Diagnosis not present

## 2012-10-21 DIAGNOSIS — L8992 Pressure ulcer of unspecified site, stage 2: Secondary | ICD-10-CM | POA: Diagnosis not present

## 2012-10-21 DIAGNOSIS — Z930 Tracheostomy status: Secondary | ICD-10-CM | POA: Diagnosis not present

## 2012-10-21 DIAGNOSIS — I10 Essential (primary) hypertension: Secondary | ICD-10-CM | POA: Diagnosis not present

## 2012-10-22 DIAGNOSIS — L89309 Pressure ulcer of unspecified buttock, unspecified stage: Secondary | ICD-10-CM | POA: Diagnosis not present

## 2012-10-22 DIAGNOSIS — I10 Essential (primary) hypertension: Secondary | ICD-10-CM | POA: Diagnosis not present

## 2012-10-22 DIAGNOSIS — L8992 Pressure ulcer of unspecified site, stage 2: Secondary | ICD-10-CM | POA: Diagnosis not present

## 2012-10-22 DIAGNOSIS — Z930 Tracheostomy status: Secondary | ICD-10-CM | POA: Diagnosis not present

## 2012-10-26 DIAGNOSIS — L8992 Pressure ulcer of unspecified site, stage 2: Secondary | ICD-10-CM | POA: Diagnosis not present

## 2012-10-26 DIAGNOSIS — L89309 Pressure ulcer of unspecified buttock, unspecified stage: Secondary | ICD-10-CM | POA: Diagnosis not present

## 2012-10-26 DIAGNOSIS — I10 Essential (primary) hypertension: Secondary | ICD-10-CM | POA: Diagnosis not present

## 2012-10-26 DIAGNOSIS — Z930 Tracheostomy status: Secondary | ICD-10-CM | POA: Diagnosis not present

## 2012-10-28 DIAGNOSIS — Z930 Tracheostomy status: Secondary | ICD-10-CM | POA: Diagnosis not present

## 2012-10-28 DIAGNOSIS — L8992 Pressure ulcer of unspecified site, stage 2: Secondary | ICD-10-CM | POA: Diagnosis not present

## 2012-10-28 DIAGNOSIS — L89309 Pressure ulcer of unspecified buttock, unspecified stage: Secondary | ICD-10-CM | POA: Diagnosis not present

## 2012-10-28 DIAGNOSIS — I10 Essential (primary) hypertension: Secondary | ICD-10-CM | POA: Diagnosis not present

## 2012-10-30 DIAGNOSIS — Z930 Tracheostomy status: Secondary | ICD-10-CM | POA: Diagnosis not present

## 2012-10-30 DIAGNOSIS — L8992 Pressure ulcer of unspecified site, stage 2: Secondary | ICD-10-CM | POA: Diagnosis not present

## 2012-10-30 DIAGNOSIS — L89309 Pressure ulcer of unspecified buttock, unspecified stage: Secondary | ICD-10-CM | POA: Diagnosis not present

## 2012-10-30 DIAGNOSIS — I10 Essential (primary) hypertension: Secondary | ICD-10-CM | POA: Diagnosis not present

## 2012-11-02 IMAGING — CR DG CHEST 2V
2 series · 2 of 2 positions shown · non-contrast
Comparison: 08/03/2008

CLINICAL DATA: Cough with shortness of breath.  Bilateral lower
extremity edema.  Smoker.  Obesity.

CHEST - 2 VIEW

[w chest pa]
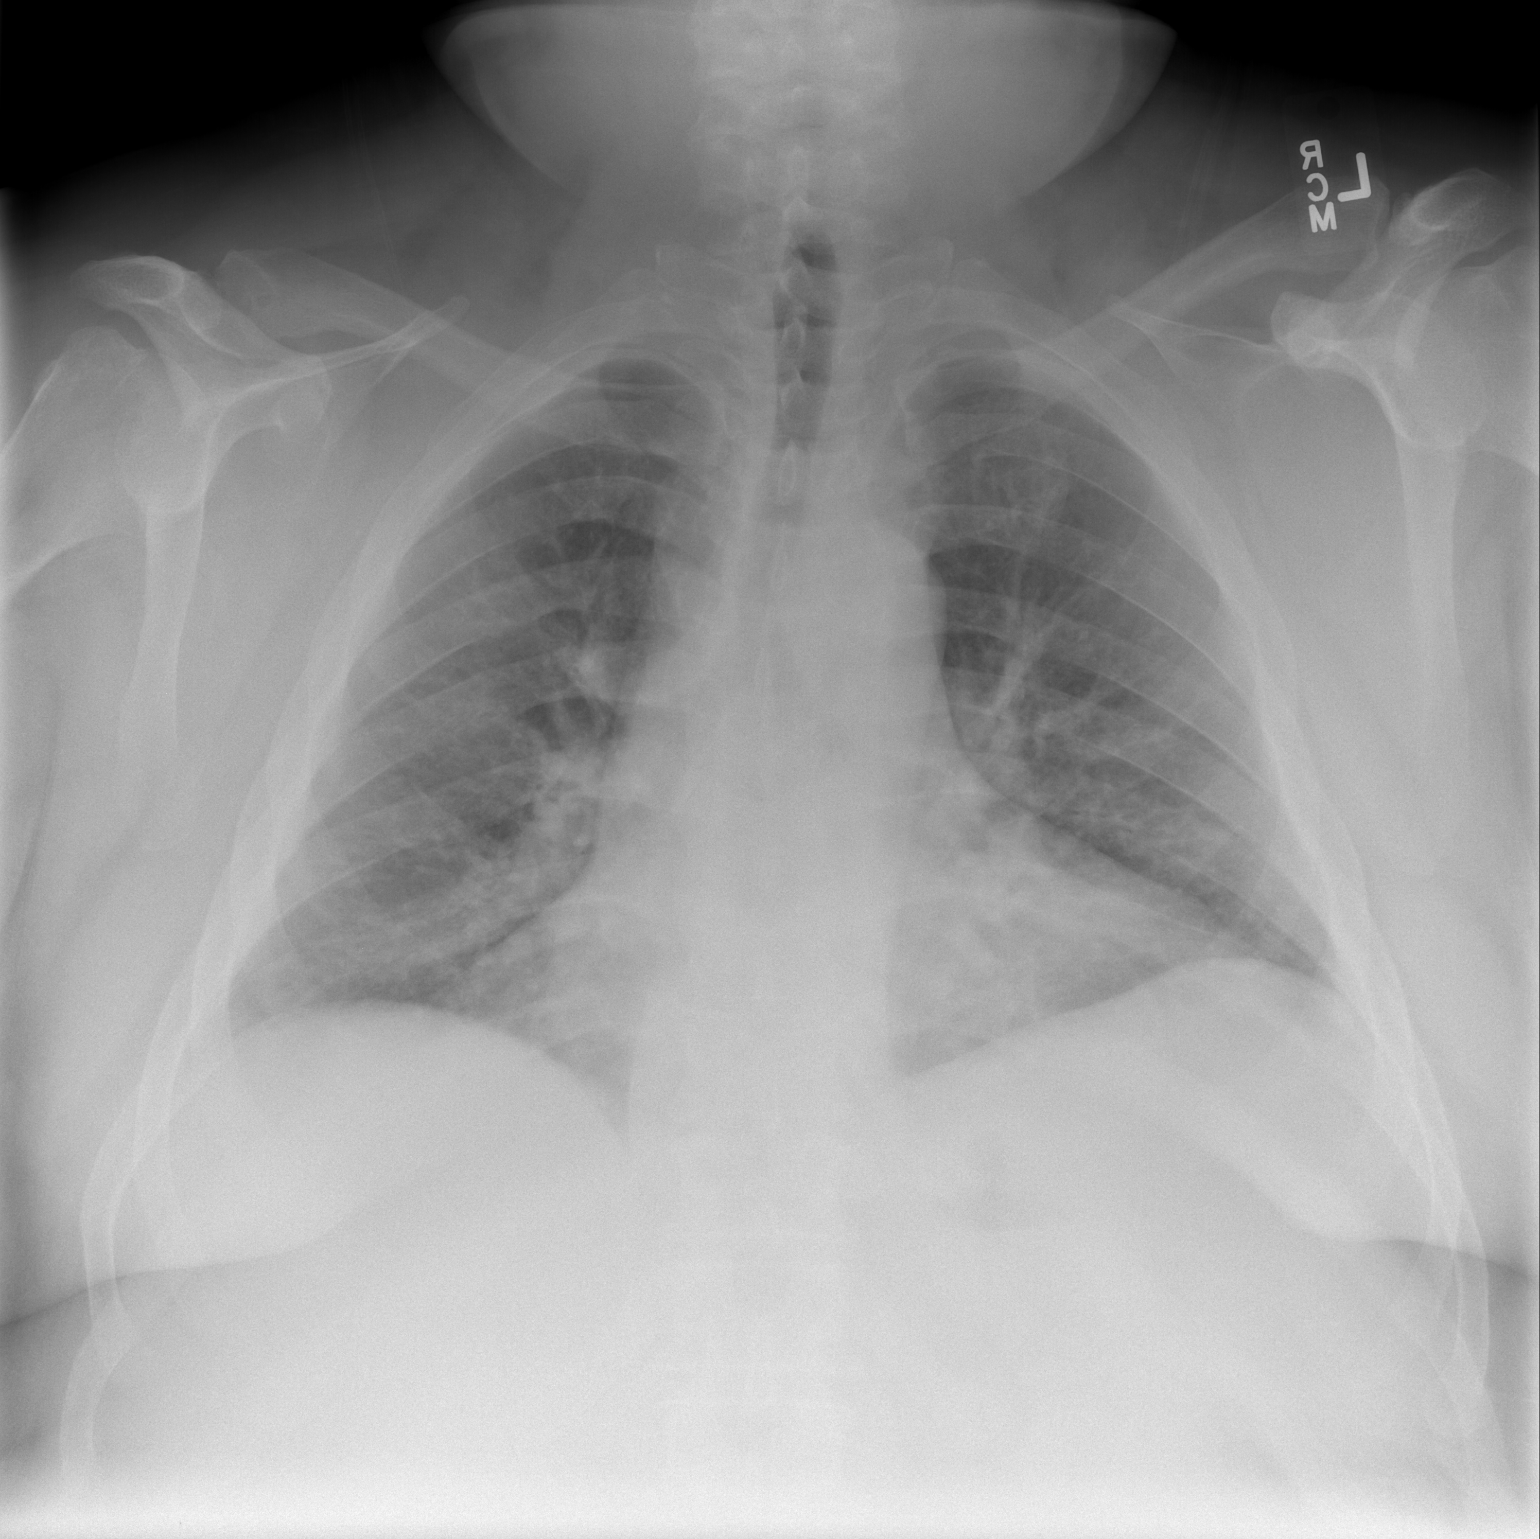

[w chest lat *]
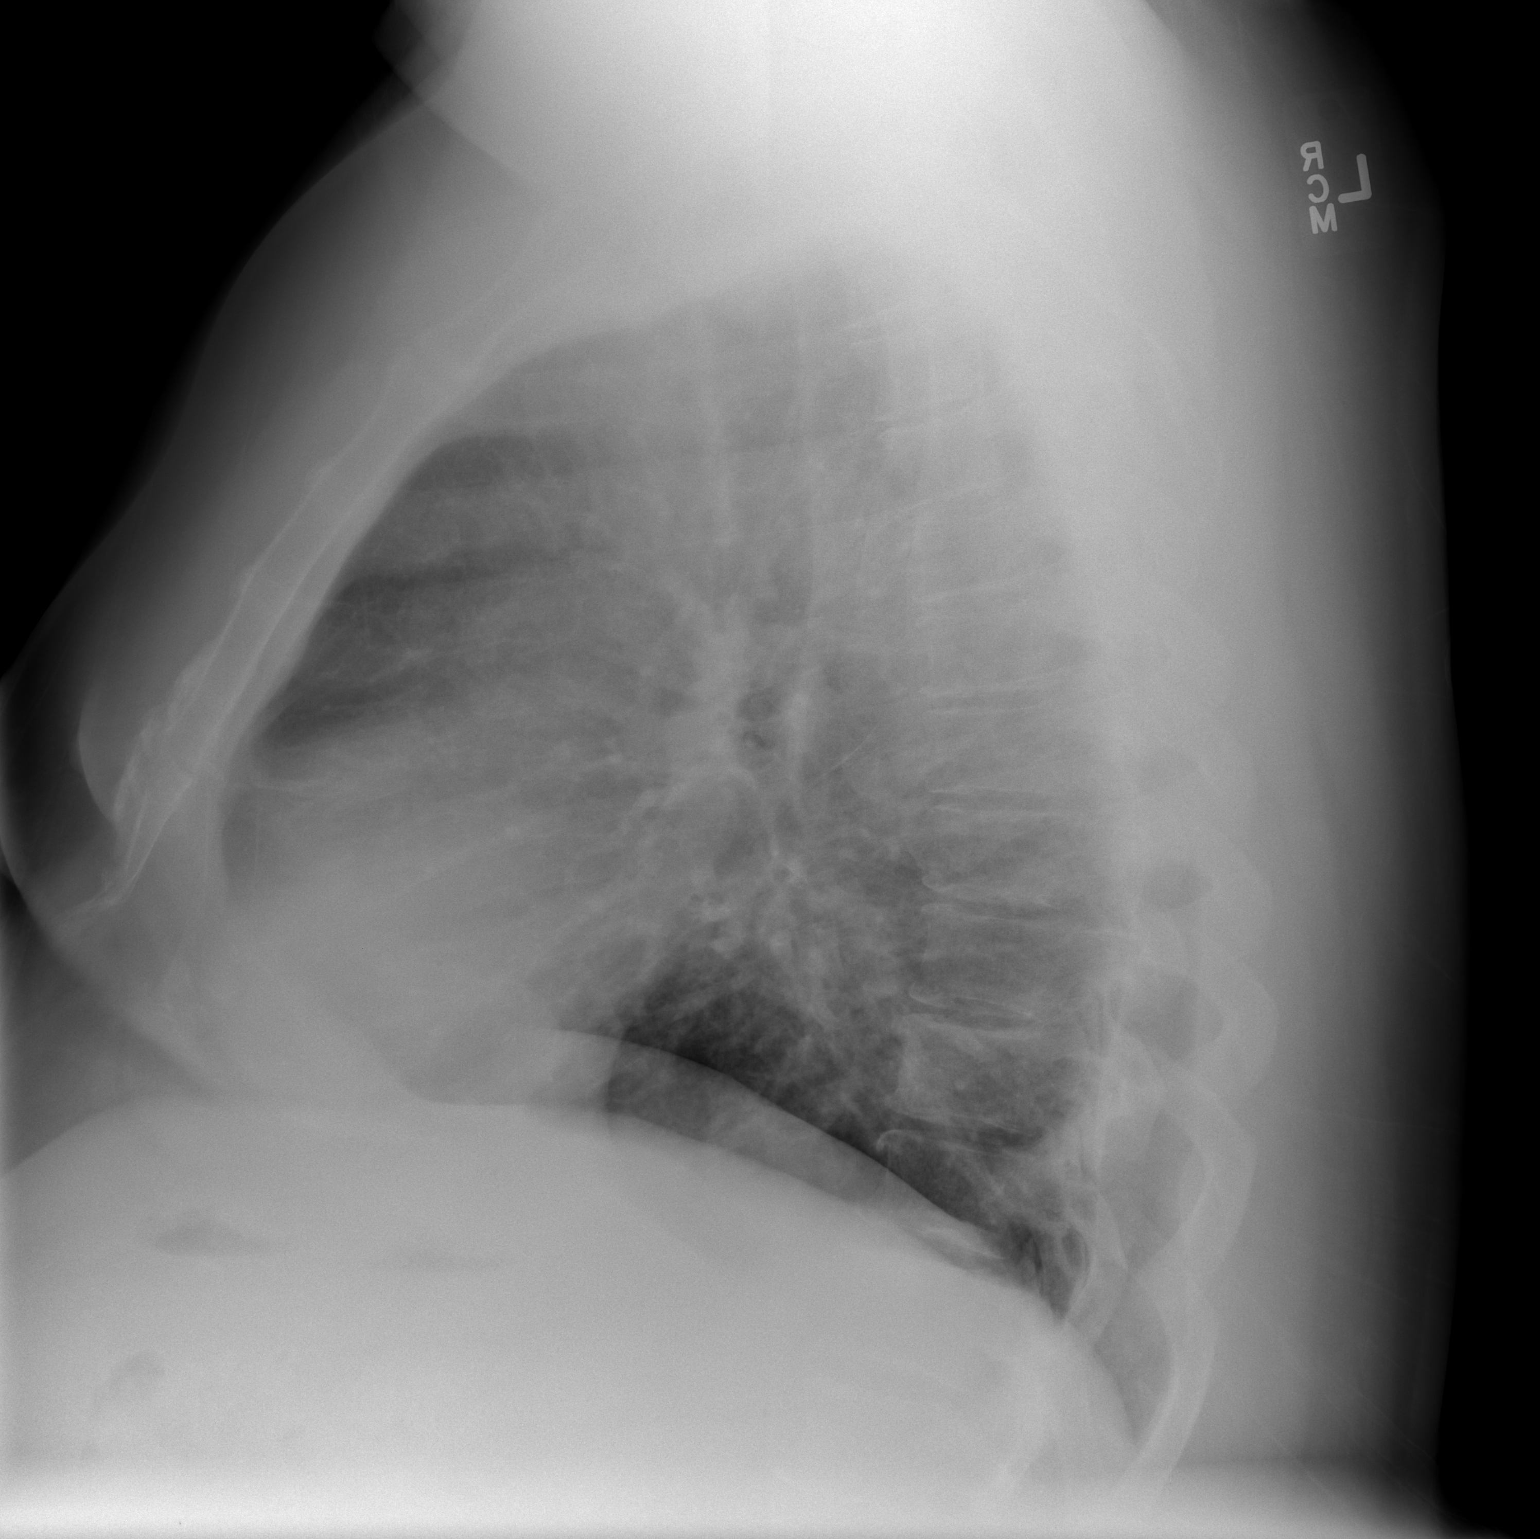

[2 of 2 positions shown; findings below may reference images not displayed]

FINDINGS: Apparent mild tracheal narrowing at the thoracic inlet is
favored to be secondary to level of inspiration.  Borderline
cardiomegaly, accentuated by low lung volumes. Mediastinal contours
otherwise within normal limits.  No pleural effusion or
pneumothorax.  Low lung volumes accentuate pulmonary interstitium.
Concurrent mild venous congestion cannot be excluded. Clear lungs.
IMPRESSION: Cardiomegaly and low lung volumes.  Cannot exclude mild pulmonary
venous congestion.

## 2012-11-03 DIAGNOSIS — Z430 Encounter for attention to tracheostomy: Secondary | ICD-10-CM | POA: Diagnosis not present

## 2012-11-04 DIAGNOSIS — I10 Essential (primary) hypertension: Secondary | ICD-10-CM | POA: Diagnosis not present

## 2012-11-04 DIAGNOSIS — L8992 Pressure ulcer of unspecified site, stage 2: Secondary | ICD-10-CM | POA: Diagnosis not present

## 2012-11-04 DIAGNOSIS — L89309 Pressure ulcer of unspecified buttock, unspecified stage: Secondary | ICD-10-CM | POA: Diagnosis not present

## 2012-11-04 DIAGNOSIS — Z930 Tracheostomy status: Secondary | ICD-10-CM | POA: Diagnosis not present

## 2012-11-06 DIAGNOSIS — Z930 Tracheostomy status: Secondary | ICD-10-CM | POA: Diagnosis not present

## 2012-11-06 DIAGNOSIS — L8992 Pressure ulcer of unspecified site, stage 2: Secondary | ICD-10-CM | POA: Diagnosis not present

## 2012-11-06 DIAGNOSIS — L89309 Pressure ulcer of unspecified buttock, unspecified stage: Secondary | ICD-10-CM | POA: Diagnosis not present

## 2012-11-06 DIAGNOSIS — I10 Essential (primary) hypertension: Secondary | ICD-10-CM | POA: Diagnosis not present

## 2012-11-09 DIAGNOSIS — L8992 Pressure ulcer of unspecified site, stage 2: Secondary | ICD-10-CM | POA: Diagnosis not present

## 2012-11-09 DIAGNOSIS — L89309 Pressure ulcer of unspecified buttock, unspecified stage: Secondary | ICD-10-CM | POA: Diagnosis not present

## 2012-11-09 DIAGNOSIS — I10 Essential (primary) hypertension: Secondary | ICD-10-CM | POA: Diagnosis not present

## 2012-11-09 DIAGNOSIS — Z930 Tracheostomy status: Secondary | ICD-10-CM | POA: Diagnosis not present

## 2012-11-13 DIAGNOSIS — Z930 Tracheostomy status: Secondary | ICD-10-CM | POA: Diagnosis not present

## 2012-11-13 DIAGNOSIS — L89309 Pressure ulcer of unspecified buttock, unspecified stage: Secondary | ICD-10-CM | POA: Diagnosis not present

## 2012-11-13 DIAGNOSIS — I10 Essential (primary) hypertension: Secondary | ICD-10-CM | POA: Diagnosis not present

## 2012-11-13 DIAGNOSIS — L8992 Pressure ulcer of unspecified site, stage 2: Secondary | ICD-10-CM | POA: Diagnosis not present

## 2012-11-16 DIAGNOSIS — L8992 Pressure ulcer of unspecified site, stage 2: Secondary | ICD-10-CM | POA: Diagnosis not present

## 2012-11-16 DIAGNOSIS — I10 Essential (primary) hypertension: Secondary | ICD-10-CM | POA: Diagnosis not present

## 2012-11-16 DIAGNOSIS — L89309 Pressure ulcer of unspecified buttock, unspecified stage: Secondary | ICD-10-CM | POA: Diagnosis not present

## 2012-11-16 DIAGNOSIS — Z930 Tracheostomy status: Secondary | ICD-10-CM | POA: Diagnosis not present

## 2012-11-18 DIAGNOSIS — L89309 Pressure ulcer of unspecified buttock, unspecified stage: Secondary | ICD-10-CM | POA: Diagnosis not present

## 2012-11-18 DIAGNOSIS — Z930 Tracheostomy status: Secondary | ICD-10-CM | POA: Diagnosis not present

## 2012-11-18 DIAGNOSIS — I10 Essential (primary) hypertension: Secondary | ICD-10-CM | POA: Diagnosis not present

## 2012-11-18 DIAGNOSIS — L8992 Pressure ulcer of unspecified site, stage 2: Secondary | ICD-10-CM | POA: Diagnosis not present

## 2012-11-19 IMAGING — CR DG CHEST 2V
2 series · 2 of 2 positions shown · non-contrast
Comparison: 12/23/2009

CLINICAL DATA: Shortness of breath.  CHF.

CHEST - 2 VIEW

[w chest pa]
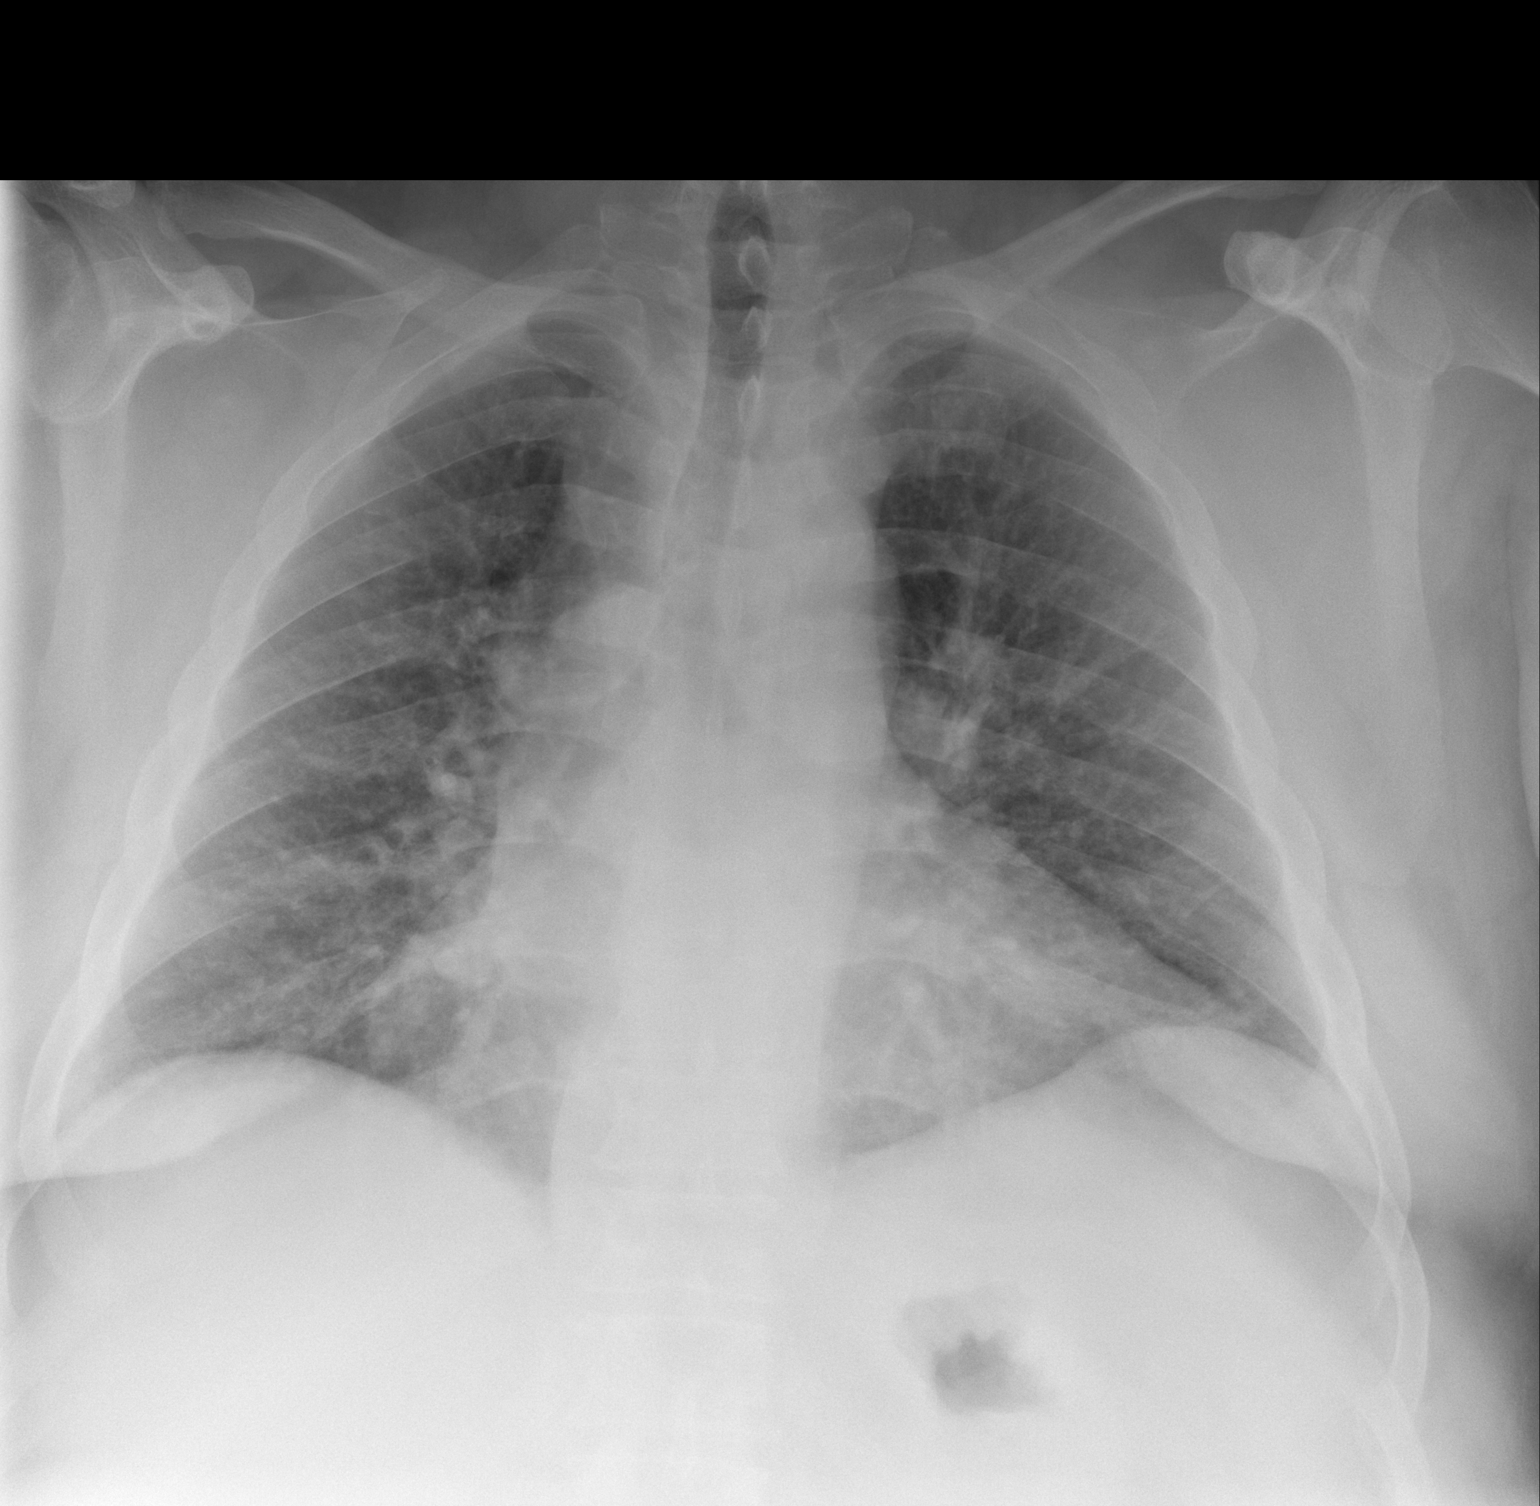

[w chest lat]
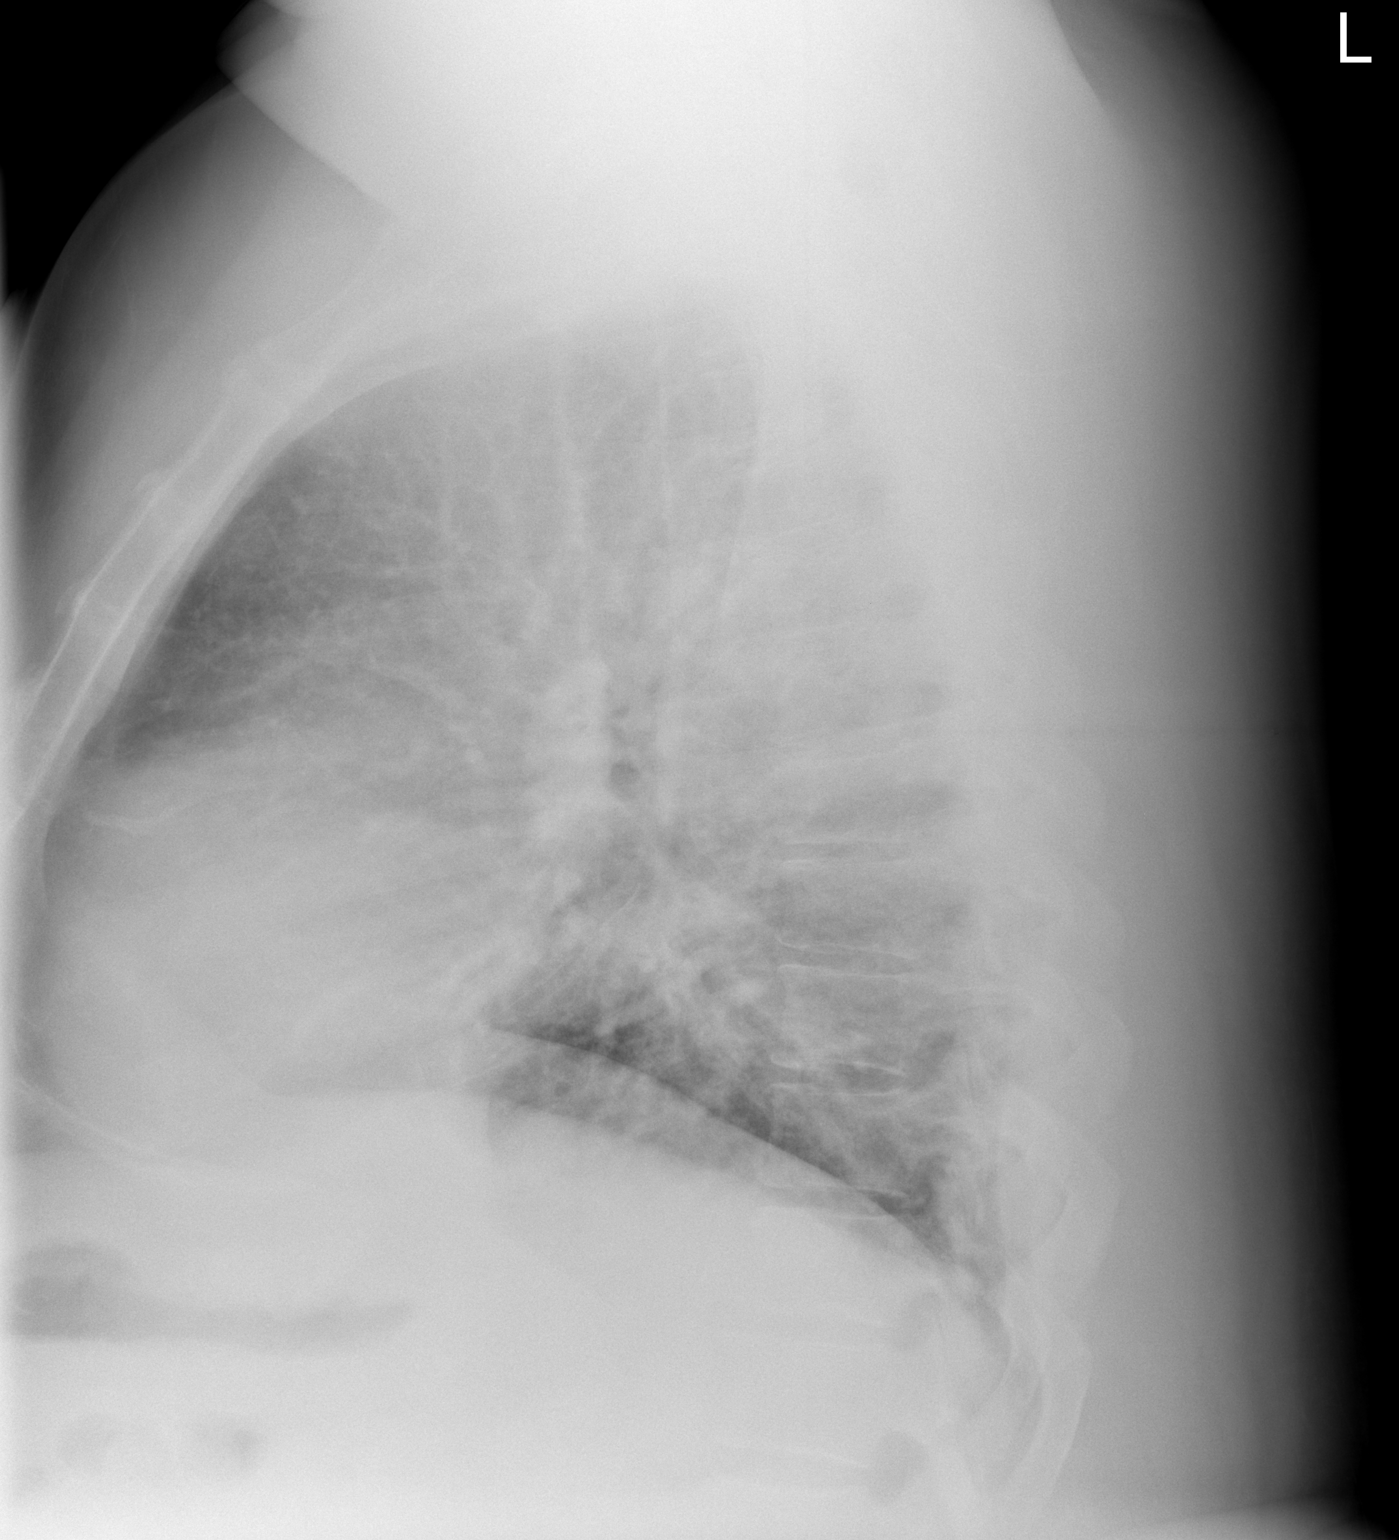

[2 of 2 positions shown; findings below may reference images not displayed]

FINDINGS: Heart is enlarged.  There is mild pulmonary vascular
congestion.  There are no focal consolidations or pleural
effusions.
IMPRESSION: Mild pulmonary edema.

## 2012-11-20 IMAGING — CR DG CHEST 1V PORT
1 series · 1 of 1 positions shown · non-contrast
Comparison: 01/08/2010 and earlier.

CLINICAL DATA: 44-year-old male with shortness of breath.

PORTABLE CHEST - 1 VIEW

[AP]
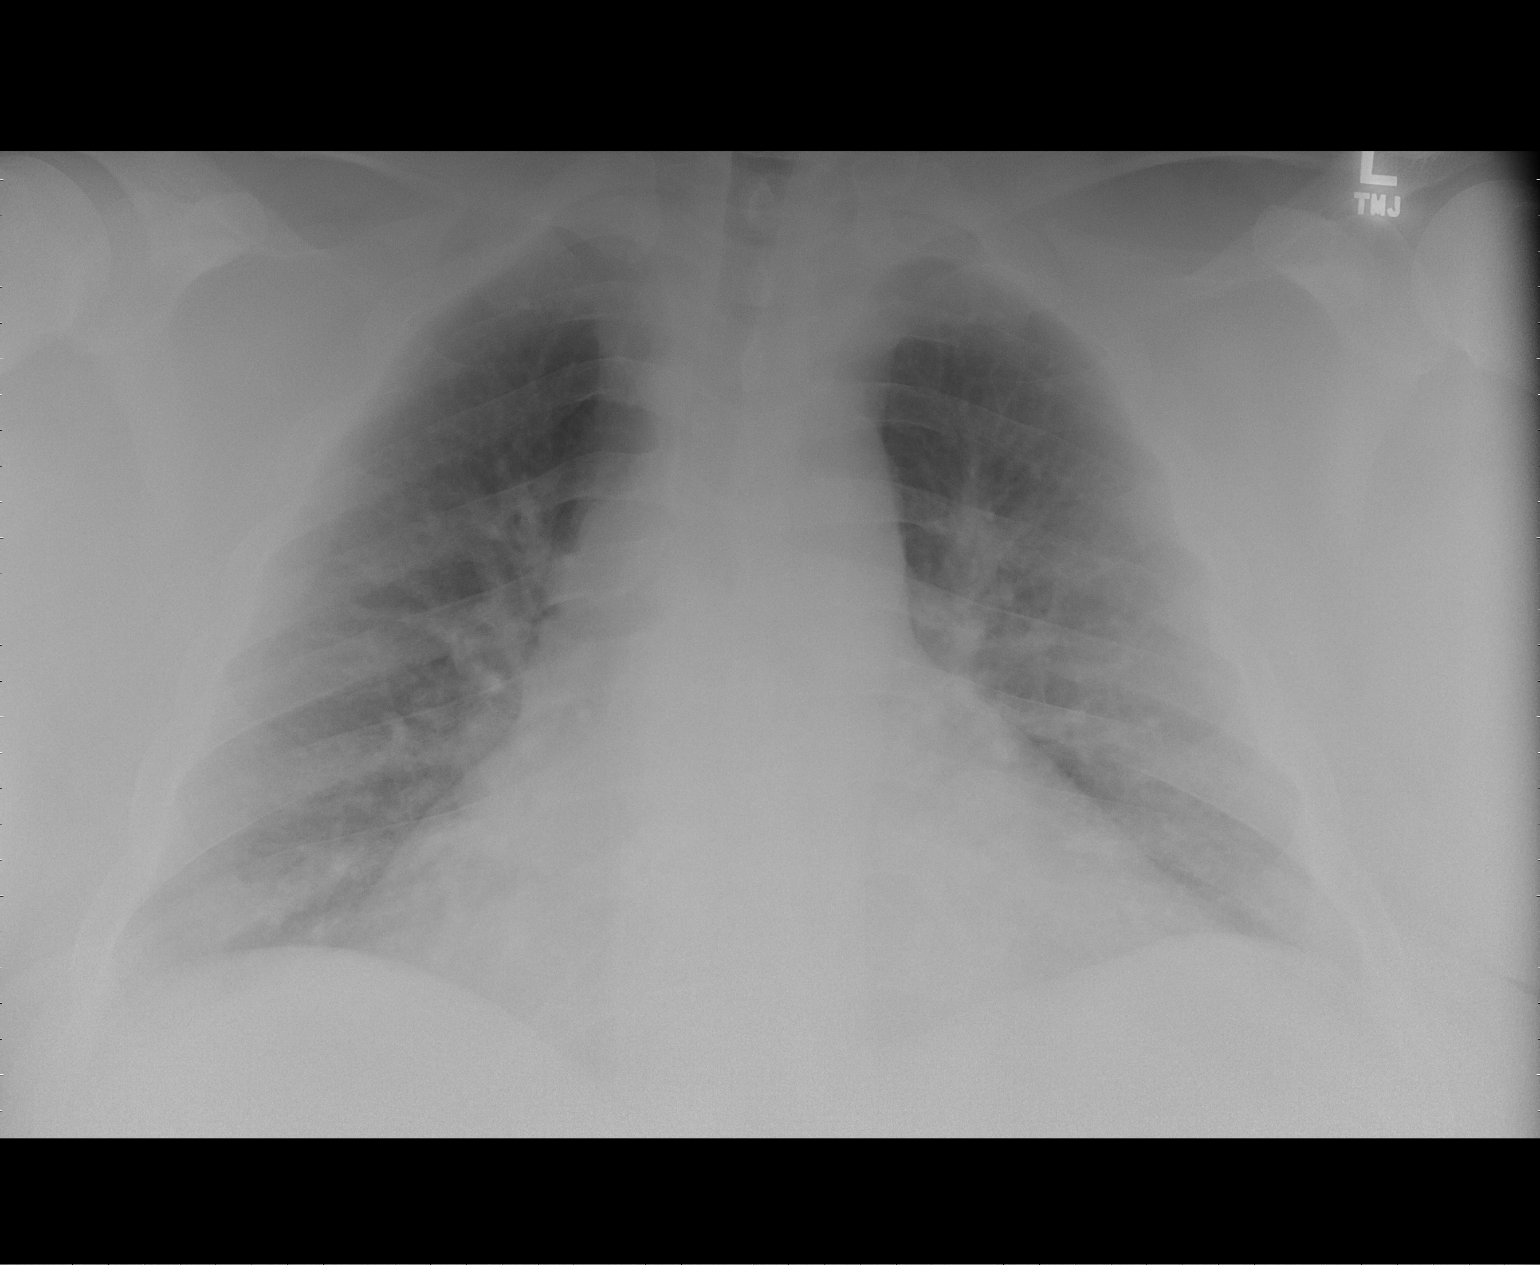

[1 of 1 positions shown; findings below may reference images not displayed]

FINDINGS: AP portable semi upright view 5434 hours.  Stable lung
volumes. Stable cardiomegaly and mediastinal contours.  The
interval decreased pulmonary vascular congestion.  No pneumothorax
or consolidation.  No definite effusion.
IMPRESSION: Interval regression of interstitial edema.  No new cardiopulmonary
abnormality.

## 2012-11-23 DIAGNOSIS — L89309 Pressure ulcer of unspecified buttock, unspecified stage: Secondary | ICD-10-CM | POA: Diagnosis not present

## 2012-11-23 DIAGNOSIS — Z930 Tracheostomy status: Secondary | ICD-10-CM | POA: Diagnosis not present

## 2012-11-23 DIAGNOSIS — I10 Essential (primary) hypertension: Secondary | ICD-10-CM | POA: Diagnosis not present

## 2012-11-23 DIAGNOSIS — L8992 Pressure ulcer of unspecified site, stage 2: Secondary | ICD-10-CM | POA: Diagnosis not present

## 2012-12-22 ENCOUNTER — Emergency Department (INDEPENDENT_AMBULATORY_CARE_PROVIDER_SITE_OTHER)
Admission: EM | Admit: 2012-12-22 | Discharge: 2012-12-22 | Disposition: A | Discharge status: Home / Self Care | Payer: Medicare Other | Source: Home / Self Care | Attending: Emergency Medicine | Admitting: Emergency Medicine

## 2012-12-22 ENCOUNTER — Encounter (HOSPITAL_COMMUNITY): Payer: Self-pay | Admitting: Emergency Medicine

## 2012-12-22 ENCOUNTER — Emergency Department (INDEPENDENT_AMBULATORY_CARE_PROVIDER_SITE_OTHER): Payer: Medicare Other

## 2012-12-22 DIAGNOSIS — L8990 Pressure ulcer of unspecified site, unspecified stage: Secondary | ICD-10-CM | POA: Diagnosis not present

## 2012-12-22 DIAGNOSIS — J0410 Acute tracheitis without obstruction: Secondary | ICD-10-CM | POA: Diagnosis not present

## 2012-12-22 DIAGNOSIS — R059 Cough, unspecified: Secondary | ICD-10-CM | POA: Diagnosis not present

## 2012-12-22 DIAGNOSIS — R05 Cough: Secondary | ICD-10-CM | POA: Diagnosis not present

## 2012-12-22 MED ORDER — HYDROCODONE-ACETAMINOPHEN 5-325 MG PO TABS
2.0000 | ORAL_TABLET | Freq: Once | ORAL | Status: AC
  Administered 2012-12-22: 2 via ORAL

## 2012-12-22 MED ORDER — CIPROFLOXACIN HCL 500 MG PO TABS: 500.0000 mg | ORAL_TABLET | Freq: Two times a day (BID) | ORAL | Status: DC

## 2012-12-22 MED ORDER — HYDROCODONE-ACETAMINOPHEN 5-325 MG PO TABS: ORAL_TABLET | ORAL | Status: DC

## 2012-12-22 MED ORDER — HYDROCODONE-ACETAMINOPHEN 5-325 MG PO TABS
ORAL_TABLET | ORAL | Status: AC
  Filled 2012-12-22: qty 2

## 2012-12-22 NOTE — ED Provider Notes (Signed)
Chief Complaint:   Chief Complaint  Patient presents with  . Cough    History of Present Illness:   [name] is a 47 year old morbidly obese male who presents today because of hemoptysis. He has a tracheostomy which was done by Dr. Lazarus Salines. Over the past 6 days he's been coughing up small amounts of dark blood mixed with clear sputum. He estimates that he'll cough up a tablespoon of sputum with blood per day. He also has pain in the area of the tracheostomy. He denies any fever, chills, or difficulty breathing. He has a bed sore on his buttocks. This is been going on for the past several years. It's painful he rates it 10 over 10 in intensity. He was on Lortabs which were given to him by his primary care physician, but then he decided to switch to someone else. He is getting wound care at home by a caregiver. She also has trach care. The trach was put in place because of severe sleep apnea. He had an episode of apnea that occurred in the [place] 2012. The patient states he went to sleep and woke up a month later with the trach. He is on oxygen at 2 L per minute and gets suctioning. He's had slight sore throat and cough.   Review of Systems:  Other than noted above, the patient denies any of the following symptoms: Systemic:  No fevers, chills, sweats, weight loss or gain, or fatigue. ENT:  No nasal congestion, sneezing, itching, postnasal drip, sinus pressure, headache, sore throat, or hoarseness. Lungs:  No wheezing, shortness of breath, chest tightness or congestion. Heart:  No chest pain, tightness, pressure, PND, orthopnea, or ankle edema. GI:  No indigestion, heartburn, waterbrash, burping, abdominal pain, nausea, or vomiting.  PMFSH:  Past medical history, family history, social history, meds, and allergies were reviewed.  Specifically, there is no history of asthma, allergies, reflux esophagitis or cigarette smoking. He is allergic to ibuprofen and tramadol. He gets a feeding supplement,  hydrocodone, artificial tears, biotene, Colace, and senna.  Physical Exam:   Vital signs:  BP 137/84  Pulse 105  Temp(Src) 98.9 F (37.2 C) (Oral)  Resp 26  SpO2 95% General:  Alert and oriented.  In no distress.  Skin warm and dry. ENT: TMs and ear canals normal.  Nasal mucosa normal, without drainage.  Pharynx clear without exudate or drainage.  No intraoral lesions. Neck:  No adenopathy, tenderness or mass.  No JVD. He has a tracheostomy in place. Tissues around the tracheostomy appeared to be healthy, not inflamed, not bleeding. The tracheostomy is patent and not occluded by any sputum or blood clots. Lungs:  No respiratory distress.  Breath sounds clear and equal bilaterally.  No wheezes, rales or rhonchi. Heart:  Regular rhythm, no gallops or murmers.  No pedal edema. Abdomon:  Soft and nontender.  No organomegaly or mass. Skin: Exam of the buttock area reveals extensive scarring from a previous large decubitus ulcer. There was some packing in place and a small decubitus ulcer in the skin fold area. This was not removed. There was no purulent drainage. This was non-malodorous. There was no surrounding erythema, tenderness, or induration.  Radiology:  Dg Chest 2 View  12/22/2012   CLINICAL DATA:  Cough, congestion, tracheostomy, obesity  EXAM: CHEST  2 VIEW  COMPARISON:  01/23/2012  FINDINGS: Tracheostomy in the mid trachea 6.5 cm above the carina. Cardiomegaly demonstrated with vascular congestion and interstitial prominence versus early edema. No effusion or  pneumothorax. No focal collapse or consolidation.  IMPRESSION: Cardiomegaly with vascular congestion versus early developing edema   Electronically Signed   By: Ruel Favors M.D.   On: 12/22/2012 13:34   Assessment:  The primary encounter diagnosis was Tracheitis. A diagnosis of Decubital ulcer, unspecified pressure ulcer stage was also pertinent to this visit.  The patient was unable to stay until the chest x-ray had been read. He  was anxious to leave, because his caregiver had to be back at a certain time. I think his hemoptysis is due to tracheitis. This is often due to Pseudomonas. I will treat with Cipro. I suggest he followup with Dr. Lazarus Salines if this doesn't improve. His decubitus seems to be healing up well. He does need something for pain. He is planning to find a new primary care physician who will supply him with pain medication in the future.  Plan:   1.  Meds:  The following meds were prescribed:   Discharge Medication List as of 12/22/2012  1:40 PM    START taking these medications   Details  !! HYDROcodone-acetaminophen (NORCO/VICODIN) 5-325 MG per tablet 1 to 2 tabs every 4 to 6 hours as needed for pain., Print    ciprofloxacin (CIPRO) 500 MG tablet Take 1 tablet (500 mg total) by mouth every 12 (twelve) hours., Starting 12/22/2012, Until Discontinued, Normal     !! - Potential duplicate medications found. Please discuss with provider.      2.  Patient Education/Counseling:  The patient was given appropriate handouts, self care instructions, and instructed in symptomatic relief.   3.  Follow up:  The patient was told to follow up if no better in 3 to 4 days, if becoming worse in any way, and given some red flag symptoms such as massive hemoptysis, difficulty breathing, or fever which would prompt immediate return.  Follow up with Dr. Lazarus Salines for any issues having to do with his tracheostomy, and with a new primary care physician with regard to pain management.       Reuben Likes, MD 12/22/12 [phone]

## 2012-12-22 NOTE — ED Notes (Signed)
Pt reports a productive cough without a fever the past week.  He denies ear pain or sinus congestion. He has a tracheostomy.   He also has had a decubitus in the coccyx the past 2 years and wants that checked.

## 2012-12-24 DIAGNOSIS — I10 Essential (primary) hypertension: Secondary | ICD-10-CM | POA: Diagnosis not present

## 2012-12-30 DIAGNOSIS — L89309 Pressure ulcer of unspecified buttock, unspecified stage: Secondary | ICD-10-CM | POA: Diagnosis not present

## 2012-12-30 DIAGNOSIS — I10 Essential (primary) hypertension: Secondary | ICD-10-CM | POA: Diagnosis not present

## 2012-12-30 DIAGNOSIS — L8992 Pressure ulcer of unspecified site, stage 2: Secondary | ICD-10-CM | POA: Diagnosis not present

## 2012-12-30 DIAGNOSIS — G4730 Sleep apnea, unspecified: Secondary | ICD-10-CM | POA: Diagnosis not present

## 2013-01-03 DIAGNOSIS — L8992 Pressure ulcer of unspecified site, stage 2: Secondary | ICD-10-CM | POA: Diagnosis not present

## 2013-01-03 DIAGNOSIS — L89309 Pressure ulcer of unspecified buttock, unspecified stage: Secondary | ICD-10-CM | POA: Diagnosis not present

## 2013-01-03 DIAGNOSIS — I10 Essential (primary) hypertension: Secondary | ICD-10-CM | POA: Diagnosis not present

## 2013-01-03 DIAGNOSIS — G4730 Sleep apnea, unspecified: Secondary | ICD-10-CM | POA: Diagnosis not present

## 2013-01-05 DIAGNOSIS — L89309 Pressure ulcer of unspecified buttock, unspecified stage: Secondary | ICD-10-CM | POA: Diagnosis not present

## 2013-01-05 DIAGNOSIS — G4730 Sleep apnea, unspecified: Secondary | ICD-10-CM | POA: Diagnosis not present

## 2013-01-05 DIAGNOSIS — I10 Essential (primary) hypertension: Secondary | ICD-10-CM | POA: Diagnosis not present

## 2013-01-05 DIAGNOSIS — Z430 Encounter for attention to tracheostomy: Secondary | ICD-10-CM | POA: Diagnosis not present

## 2013-01-05 DIAGNOSIS — G4733 Obstructive sleep apnea (adult) (pediatric): Secondary | ICD-10-CM | POA: Diagnosis not present

## 2013-01-05 DIAGNOSIS — L8992 Pressure ulcer of unspecified site, stage 2: Secondary | ICD-10-CM | POA: Diagnosis not present

## 2013-01-05 DIAGNOSIS — E662 Morbid (severe) obesity with alveolar hypoventilation: Secondary | ICD-10-CM | POA: Diagnosis not present

## 2013-01-08 DIAGNOSIS — I10 Essential (primary) hypertension: Secondary | ICD-10-CM | POA: Diagnosis not present

## 2013-01-08 DIAGNOSIS — G4730 Sleep apnea, unspecified: Secondary | ICD-10-CM | POA: Diagnosis not present

## 2013-01-08 DIAGNOSIS — L89309 Pressure ulcer of unspecified buttock, unspecified stage: Secondary | ICD-10-CM | POA: Diagnosis not present

## 2013-01-08 DIAGNOSIS — L8992 Pressure ulcer of unspecified site, stage 2: Secondary | ICD-10-CM | POA: Diagnosis not present

## 2013-01-11 DIAGNOSIS — G4730 Sleep apnea, unspecified: Secondary | ICD-10-CM | POA: Diagnosis not present

## 2013-01-11 DIAGNOSIS — I10 Essential (primary) hypertension: Secondary | ICD-10-CM | POA: Diagnosis not present

## 2013-01-11 DIAGNOSIS — L89309 Pressure ulcer of unspecified buttock, unspecified stage: Secondary | ICD-10-CM | POA: Diagnosis not present

## 2013-01-11 DIAGNOSIS — L8992 Pressure ulcer of unspecified site, stage 2: Secondary | ICD-10-CM | POA: Diagnosis not present

## 2013-01-13 DIAGNOSIS — I10 Essential (primary) hypertension: Secondary | ICD-10-CM | POA: Diagnosis not present

## 2013-01-13 DIAGNOSIS — L89309 Pressure ulcer of unspecified buttock, unspecified stage: Secondary | ICD-10-CM | POA: Diagnosis not present

## 2013-01-13 DIAGNOSIS — L8992 Pressure ulcer of unspecified site, stage 2: Secondary | ICD-10-CM | POA: Diagnosis not present

## 2013-01-13 DIAGNOSIS — G4730 Sleep apnea, unspecified: Secondary | ICD-10-CM | POA: Diagnosis not present

## 2013-01-18 DIAGNOSIS — L89309 Pressure ulcer of unspecified buttock, unspecified stage: Secondary | ICD-10-CM | POA: Diagnosis not present

## 2013-01-18 DIAGNOSIS — G4730 Sleep apnea, unspecified: Secondary | ICD-10-CM | POA: Diagnosis not present

## 2013-01-18 DIAGNOSIS — I10 Essential (primary) hypertension: Secondary | ICD-10-CM | POA: Diagnosis not present

## 2013-01-18 DIAGNOSIS — L8992 Pressure ulcer of unspecified site, stage 2: Secondary | ICD-10-CM | POA: Diagnosis not present

## 2013-01-22 DIAGNOSIS — L8992 Pressure ulcer of unspecified site, stage 2: Secondary | ICD-10-CM | POA: Diagnosis not present

## 2013-01-22 DIAGNOSIS — G4730 Sleep apnea, unspecified: Secondary | ICD-10-CM | POA: Diagnosis not present

## 2013-01-22 DIAGNOSIS — L89309 Pressure ulcer of unspecified buttock, unspecified stage: Secondary | ICD-10-CM | POA: Diagnosis not present

## 2013-01-22 DIAGNOSIS — I10 Essential (primary) hypertension: Secondary | ICD-10-CM | POA: Diagnosis not present

## 2013-01-25 DIAGNOSIS — G4730 Sleep apnea, unspecified: Secondary | ICD-10-CM | POA: Diagnosis not present

## 2013-01-25 DIAGNOSIS — L89309 Pressure ulcer of unspecified buttock, unspecified stage: Secondary | ICD-10-CM | POA: Diagnosis not present

## 2013-01-25 DIAGNOSIS — L8992 Pressure ulcer of unspecified site, stage 2: Secondary | ICD-10-CM | POA: Diagnosis not present

## 2013-01-25 DIAGNOSIS — I10 Essential (primary) hypertension: Secondary | ICD-10-CM | POA: Diagnosis not present

## 2013-01-29 DIAGNOSIS — L8992 Pressure ulcer of unspecified site, stage 2: Secondary | ICD-10-CM | POA: Diagnosis not present

## 2013-01-29 DIAGNOSIS — I10 Essential (primary) hypertension: Secondary | ICD-10-CM | POA: Diagnosis not present

## 2013-01-29 DIAGNOSIS — L89309 Pressure ulcer of unspecified buttock, unspecified stage: Secondary | ICD-10-CM | POA: Diagnosis not present

## 2013-01-29 DIAGNOSIS — G4730 Sleep apnea, unspecified: Secondary | ICD-10-CM | POA: Diagnosis not present

## 2013-01-30 DIAGNOSIS — G4733 Obstructive sleep apnea (adult) (pediatric): Secondary | ICD-10-CM | POA: Diagnosis not present

## 2013-01-30 DIAGNOSIS — I10 Essential (primary) hypertension: Secondary | ICD-10-CM | POA: Diagnosis not present

## 2013-01-30 DIAGNOSIS — I509 Heart failure, unspecified: Secondary | ICD-10-CM | POA: Diagnosis not present

## 2013-02-01 DIAGNOSIS — G4730 Sleep apnea, unspecified: Secondary | ICD-10-CM | POA: Diagnosis not present

## 2013-02-01 DIAGNOSIS — L89309 Pressure ulcer of unspecified buttock, unspecified stage: Secondary | ICD-10-CM | POA: Diagnosis not present

## 2013-02-01 DIAGNOSIS — L8992 Pressure ulcer of unspecified site, stage 2: Secondary | ICD-10-CM | POA: Diagnosis not present

## 2013-02-01 DIAGNOSIS — I10 Essential (primary) hypertension: Secondary | ICD-10-CM | POA: Diagnosis not present

## 2013-02-03 DIAGNOSIS — L89309 Pressure ulcer of unspecified buttock, unspecified stage: Secondary | ICD-10-CM | POA: Diagnosis not present

## 2013-02-03 DIAGNOSIS — L8992 Pressure ulcer of unspecified site, stage 2: Secondary | ICD-10-CM | POA: Diagnosis not present

## 2013-02-03 DIAGNOSIS — I10 Essential (primary) hypertension: Secondary | ICD-10-CM | POA: Diagnosis not present

## 2013-02-03 DIAGNOSIS — G4730 Sleep apnea, unspecified: Secondary | ICD-10-CM | POA: Diagnosis not present

## 2013-02-08 DIAGNOSIS — L89309 Pressure ulcer of unspecified buttock, unspecified stage: Secondary | ICD-10-CM | POA: Diagnosis not present

## 2013-02-08 DIAGNOSIS — L8992 Pressure ulcer of unspecified site, stage 2: Secondary | ICD-10-CM | POA: Diagnosis not present

## 2013-02-08 DIAGNOSIS — I10 Essential (primary) hypertension: Secondary | ICD-10-CM | POA: Diagnosis not present

## 2013-02-08 DIAGNOSIS — G4730 Sleep apnea, unspecified: Secondary | ICD-10-CM | POA: Diagnosis not present

## 2013-02-12 DIAGNOSIS — G4730 Sleep apnea, unspecified: Secondary | ICD-10-CM | POA: Diagnosis not present

## 2013-02-12 DIAGNOSIS — I10 Essential (primary) hypertension: Secondary | ICD-10-CM | POA: Diagnosis not present

## 2013-02-12 DIAGNOSIS — L89309 Pressure ulcer of unspecified buttock, unspecified stage: Secondary | ICD-10-CM | POA: Diagnosis not present

## 2013-02-12 DIAGNOSIS — K5901 Slow transit constipation: Secondary | ICD-10-CM | POA: Diagnosis not present

## 2013-02-12 DIAGNOSIS — L8992 Pressure ulcer of unspecified site, stage 2: Secondary | ICD-10-CM | POA: Diagnosis not present

## 2013-02-12 DIAGNOSIS — G4733 Obstructive sleep apnea (adult) (pediatric): Secondary | ICD-10-CM | POA: Diagnosis not present

## 2013-03-01 DIAGNOSIS — L89109 Pressure ulcer of unspecified part of back, unspecified stage: Secondary | ICD-10-CM | POA: Diagnosis not present

## 2013-03-01 DIAGNOSIS — E119 Type 2 diabetes mellitus without complications: Secondary | ICD-10-CM | POA: Diagnosis not present

## 2013-03-01 DIAGNOSIS — L8994 Pressure ulcer of unspecified site, stage 4: Secondary | ICD-10-CM | POA: Diagnosis not present

## 2013-03-01 DIAGNOSIS — G8929 Other chronic pain: Secondary | ICD-10-CM | POA: Diagnosis not present

## 2013-03-04 DIAGNOSIS — L89109 Pressure ulcer of unspecified part of back, unspecified stage: Secondary | ICD-10-CM | POA: Diagnosis not present

## 2013-03-04 DIAGNOSIS — E119 Type 2 diabetes mellitus without complications: Secondary | ICD-10-CM | POA: Diagnosis not present

## 2013-03-04 DIAGNOSIS — G8929 Other chronic pain: Secondary | ICD-10-CM | POA: Diagnosis not present

## 2013-03-04 DIAGNOSIS — L8994 Pressure ulcer of unspecified site, stage 4: Secondary | ICD-10-CM | POA: Diagnosis not present

## 2013-03-08 DIAGNOSIS — L8994 Pressure ulcer of unspecified site, stage 4: Secondary | ICD-10-CM | POA: Diagnosis not present

## 2013-03-08 DIAGNOSIS — L89109 Pressure ulcer of unspecified part of back, unspecified stage: Secondary | ICD-10-CM | POA: Diagnosis not present

## 2013-03-08 DIAGNOSIS — G8929 Other chronic pain: Secondary | ICD-10-CM | POA: Diagnosis not present

## 2013-03-08 DIAGNOSIS — E119 Type 2 diabetes mellitus without complications: Secondary | ICD-10-CM | POA: Diagnosis not present

## 2013-03-11 DIAGNOSIS — L8994 Pressure ulcer of unspecified site, stage 4: Secondary | ICD-10-CM | POA: Diagnosis not present

## 2013-03-11 DIAGNOSIS — E119 Type 2 diabetes mellitus without complications: Secondary | ICD-10-CM | POA: Diagnosis not present

## 2013-03-11 DIAGNOSIS — G8929 Other chronic pain: Secondary | ICD-10-CM | POA: Diagnosis not present

## 2013-03-11 DIAGNOSIS — L89109 Pressure ulcer of unspecified part of back, unspecified stage: Secondary | ICD-10-CM | POA: Diagnosis not present

## 2013-03-15 DIAGNOSIS — G8929 Other chronic pain: Secondary | ICD-10-CM | POA: Diagnosis not present

## 2013-03-15 DIAGNOSIS — L89109 Pressure ulcer of unspecified part of back, unspecified stage: Secondary | ICD-10-CM | POA: Diagnosis not present

## 2013-03-15 DIAGNOSIS — E119 Type 2 diabetes mellitus without complications: Secondary | ICD-10-CM | POA: Diagnosis not present

## 2013-03-15 DIAGNOSIS — L8994 Pressure ulcer of unspecified site, stage 4: Secondary | ICD-10-CM | POA: Diagnosis not present

## 2013-03-18 DIAGNOSIS — E119 Type 2 diabetes mellitus without complications: Secondary | ICD-10-CM | POA: Diagnosis not present

## 2013-03-18 DIAGNOSIS — L89109 Pressure ulcer of unspecified part of back, unspecified stage: Secondary | ICD-10-CM | POA: Diagnosis not present

## 2013-03-18 DIAGNOSIS — G8929 Other chronic pain: Secondary | ICD-10-CM | POA: Diagnosis not present

## 2013-03-18 DIAGNOSIS — L8994 Pressure ulcer of unspecified site, stage 4: Secondary | ICD-10-CM | POA: Diagnosis not present

## 2013-03-19 DIAGNOSIS — E662 Morbid (severe) obesity with alveolar hypoventilation: Secondary | ICD-10-CM | POA: Diagnosis not present

## 2013-03-19 DIAGNOSIS — Z430 Encounter for attention to tracheostomy: Secondary | ICD-10-CM | POA: Diagnosis not present

## 2013-03-22 DIAGNOSIS — L8994 Pressure ulcer of unspecified site, stage 4: Secondary | ICD-10-CM | POA: Diagnosis not present

## 2013-03-22 DIAGNOSIS — E119 Type 2 diabetes mellitus without complications: Secondary | ICD-10-CM | POA: Diagnosis not present

## 2013-03-22 DIAGNOSIS — G8929 Other chronic pain: Secondary | ICD-10-CM | POA: Diagnosis not present

## 2013-03-22 DIAGNOSIS — L89109 Pressure ulcer of unspecified part of back, unspecified stage: Secondary | ICD-10-CM | POA: Diagnosis not present

## 2013-03-25 DIAGNOSIS — G8929 Other chronic pain: Secondary | ICD-10-CM | POA: Diagnosis not present

## 2013-03-25 DIAGNOSIS — L8994 Pressure ulcer of unspecified site, stage 4: Secondary | ICD-10-CM | POA: Diagnosis not present

## 2013-03-25 DIAGNOSIS — E119 Type 2 diabetes mellitus without complications: Secondary | ICD-10-CM | POA: Diagnosis not present

## 2013-03-25 DIAGNOSIS — L89109 Pressure ulcer of unspecified part of back, unspecified stage: Secondary | ICD-10-CM | POA: Diagnosis not present

## 2013-03-29 DIAGNOSIS — E119 Type 2 diabetes mellitus without complications: Secondary | ICD-10-CM | POA: Diagnosis not present

## 2013-03-29 DIAGNOSIS — L89109 Pressure ulcer of unspecified part of back, unspecified stage: Secondary | ICD-10-CM | POA: Diagnosis not present

## 2013-03-29 DIAGNOSIS — G8929 Other chronic pain: Secondary | ICD-10-CM | POA: Diagnosis not present

## 2013-03-29 DIAGNOSIS — L8994 Pressure ulcer of unspecified site, stage 4: Secondary | ICD-10-CM | POA: Diagnosis not present

## 2013-04-01 DIAGNOSIS — E119 Type 2 diabetes mellitus without complications: Secondary | ICD-10-CM | POA: Diagnosis not present

## 2013-04-01 DIAGNOSIS — L8994 Pressure ulcer of unspecified site, stage 4: Secondary | ICD-10-CM | POA: Diagnosis not present

## 2013-04-01 DIAGNOSIS — L89109 Pressure ulcer of unspecified part of back, unspecified stage: Secondary | ICD-10-CM | POA: Diagnosis not present

## 2013-04-01 DIAGNOSIS — G8929 Other chronic pain: Secondary | ICD-10-CM | POA: Diagnosis not present

## 2013-04-05 DIAGNOSIS — E119 Type 2 diabetes mellitus without complications: Secondary | ICD-10-CM | POA: Diagnosis not present

## 2013-04-05 DIAGNOSIS — G8929 Other chronic pain: Secondary | ICD-10-CM | POA: Diagnosis not present

## 2013-04-05 DIAGNOSIS — L8994 Pressure ulcer of unspecified site, stage 4: Secondary | ICD-10-CM | POA: Diagnosis not present

## 2013-04-05 DIAGNOSIS — L89109 Pressure ulcer of unspecified part of back, unspecified stage: Secondary | ICD-10-CM | POA: Diagnosis not present

## 2013-04-07 DIAGNOSIS — R52 Pain, unspecified: Secondary | ICD-10-CM | POA: Diagnosis not present

## 2013-04-07 DIAGNOSIS — I10 Essential (primary) hypertension: Secondary | ICD-10-CM | POA: Diagnosis not present

## 2013-04-07 DIAGNOSIS — G4733 Obstructive sleep apnea (adult) (pediatric): Secondary | ICD-10-CM | POA: Diagnosis not present

## 2013-04-07 DIAGNOSIS — I509 Heart failure, unspecified: Secondary | ICD-10-CM | POA: Diagnosis not present

## 2013-04-10 DIAGNOSIS — L8994 Pressure ulcer of unspecified site, stage 4: Secondary | ICD-10-CM | POA: Diagnosis not present

## 2013-04-10 DIAGNOSIS — L89109 Pressure ulcer of unspecified part of back, unspecified stage: Secondary | ICD-10-CM | POA: Diagnosis not present

## 2013-04-10 DIAGNOSIS — E119 Type 2 diabetes mellitus without complications: Secondary | ICD-10-CM | POA: Diagnosis not present

## 2013-04-10 DIAGNOSIS — G8929 Other chronic pain: Secondary | ICD-10-CM | POA: Diagnosis not present

## 2013-04-12 DIAGNOSIS — G8929 Other chronic pain: Secondary | ICD-10-CM | POA: Diagnosis not present

## 2013-04-12 DIAGNOSIS — E119 Type 2 diabetes mellitus without complications: Secondary | ICD-10-CM | POA: Diagnosis not present

## 2013-04-12 DIAGNOSIS — L89109 Pressure ulcer of unspecified part of back, unspecified stage: Secondary | ICD-10-CM | POA: Diagnosis not present

## 2013-04-12 DIAGNOSIS — L8994 Pressure ulcer of unspecified site, stage 4: Secondary | ICD-10-CM | POA: Diagnosis not present

## 2013-04-15 DIAGNOSIS — L8994 Pressure ulcer of unspecified site, stage 4: Secondary | ICD-10-CM | POA: Diagnosis not present

## 2013-04-15 DIAGNOSIS — G8929 Other chronic pain: Secondary | ICD-10-CM | POA: Diagnosis not present

## 2013-04-15 DIAGNOSIS — E119 Type 2 diabetes mellitus without complications: Secondary | ICD-10-CM | POA: Diagnosis not present

## 2013-04-15 DIAGNOSIS — L89109 Pressure ulcer of unspecified part of back, unspecified stage: Secondary | ICD-10-CM | POA: Diagnosis not present

## 2013-04-19 DIAGNOSIS — G8929 Other chronic pain: Secondary | ICD-10-CM | POA: Diagnosis not present

## 2013-04-19 DIAGNOSIS — L8994 Pressure ulcer of unspecified site, stage 4: Secondary | ICD-10-CM | POA: Diagnosis not present

## 2013-04-19 DIAGNOSIS — E119 Type 2 diabetes mellitus without complications: Secondary | ICD-10-CM | POA: Diagnosis not present

## 2013-04-19 DIAGNOSIS — L89109 Pressure ulcer of unspecified part of back, unspecified stage: Secondary | ICD-10-CM | POA: Diagnosis not present

## 2013-04-21 DIAGNOSIS — L8994 Pressure ulcer of unspecified site, stage 4: Secondary | ICD-10-CM | POA: Diagnosis not present

## 2013-04-21 DIAGNOSIS — E119 Type 2 diabetes mellitus without complications: Secondary | ICD-10-CM | POA: Diagnosis not present

## 2013-04-21 DIAGNOSIS — L89109 Pressure ulcer of unspecified part of back, unspecified stage: Secondary | ICD-10-CM | POA: Diagnosis not present

## 2013-04-21 DIAGNOSIS — G8929 Other chronic pain: Secondary | ICD-10-CM | POA: Diagnosis not present

## 2013-04-22 DIAGNOSIS — L8994 Pressure ulcer of unspecified site, stage 4: Secondary | ICD-10-CM | POA: Diagnosis not present

## 2013-04-22 DIAGNOSIS — L89109 Pressure ulcer of unspecified part of back, unspecified stage: Secondary | ICD-10-CM | POA: Diagnosis not present

## 2013-04-22 DIAGNOSIS — E119 Type 2 diabetes mellitus without complications: Secondary | ICD-10-CM | POA: Diagnosis not present

## 2013-04-22 DIAGNOSIS — G8929 Other chronic pain: Secondary | ICD-10-CM | POA: Diagnosis not present

## 2013-04-26 DIAGNOSIS — E119 Type 2 diabetes mellitus without complications: Secondary | ICD-10-CM | POA: Diagnosis not present

## 2013-04-26 DIAGNOSIS — L8994 Pressure ulcer of unspecified site, stage 4: Secondary | ICD-10-CM | POA: Diagnosis not present

## 2013-04-26 DIAGNOSIS — G8929 Other chronic pain: Secondary | ICD-10-CM | POA: Diagnosis not present

## 2013-04-26 DIAGNOSIS — L89109 Pressure ulcer of unspecified part of back, unspecified stage: Secondary | ICD-10-CM | POA: Diagnosis not present

## 2013-04-28 DIAGNOSIS — L89109 Pressure ulcer of unspecified part of back, unspecified stage: Secondary | ICD-10-CM | POA: Diagnosis not present

## 2013-04-28 DIAGNOSIS — G8929 Other chronic pain: Secondary | ICD-10-CM | POA: Diagnosis not present

## 2013-04-28 DIAGNOSIS — E119 Type 2 diabetes mellitus without complications: Secondary | ICD-10-CM | POA: Diagnosis not present

## 2013-04-28 DIAGNOSIS — L8994 Pressure ulcer of unspecified site, stage 4: Secondary | ICD-10-CM | POA: Diagnosis not present

## 2013-04-29 DIAGNOSIS — E119 Type 2 diabetes mellitus without complications: Secondary | ICD-10-CM | POA: Diagnosis not present

## 2013-04-29 DIAGNOSIS — G8929 Other chronic pain: Secondary | ICD-10-CM | POA: Diagnosis not present

## 2013-04-29 DIAGNOSIS — L89109 Pressure ulcer of unspecified part of back, unspecified stage: Secondary | ICD-10-CM | POA: Diagnosis not present

## 2013-04-29 DIAGNOSIS — L8994 Pressure ulcer of unspecified site, stage 4: Secondary | ICD-10-CM | POA: Diagnosis not present

## 2013-04-30 DIAGNOSIS — L8994 Pressure ulcer of unspecified site, stage 4: Secondary | ICD-10-CM | POA: Diagnosis not present

## 2013-04-30 DIAGNOSIS — G8929 Other chronic pain: Secondary | ICD-10-CM | POA: Diagnosis not present

## 2013-04-30 DIAGNOSIS — L89109 Pressure ulcer of unspecified part of back, unspecified stage: Secondary | ICD-10-CM | POA: Diagnosis not present

## 2013-05-03 DIAGNOSIS — L89109 Pressure ulcer of unspecified part of back, unspecified stage: Secondary | ICD-10-CM | POA: Diagnosis not present

## 2013-05-03 DIAGNOSIS — G8929 Other chronic pain: Secondary | ICD-10-CM | POA: Diagnosis not present

## 2013-05-03 DIAGNOSIS — L8994 Pressure ulcer of unspecified site, stage 4: Secondary | ICD-10-CM | POA: Diagnosis not present

## 2013-05-06 DIAGNOSIS — G8929 Other chronic pain: Secondary | ICD-10-CM | POA: Diagnosis not present

## 2013-05-06 DIAGNOSIS — L89109 Pressure ulcer of unspecified part of back, unspecified stage: Secondary | ICD-10-CM | POA: Diagnosis not present

## 2013-05-06 DIAGNOSIS — L8994 Pressure ulcer of unspecified site, stage 4: Secondary | ICD-10-CM | POA: Diagnosis not present

## 2013-05-10 DIAGNOSIS — L89109 Pressure ulcer of unspecified part of back, unspecified stage: Secondary | ICD-10-CM | POA: Diagnosis not present

## 2013-05-10 DIAGNOSIS — L8994 Pressure ulcer of unspecified site, stage 4: Secondary | ICD-10-CM | POA: Diagnosis not present

## 2013-05-10 DIAGNOSIS — G8929 Other chronic pain: Secondary | ICD-10-CM | POA: Diagnosis not present

## 2013-05-12 DIAGNOSIS — E119 Type 2 diabetes mellitus without complications: Secondary | ICD-10-CM | POA: Diagnosis not present

## 2013-05-12 DIAGNOSIS — E039 Hypothyroidism, unspecified: Secondary | ICD-10-CM | POA: Diagnosis not present

## 2013-05-12 DIAGNOSIS — E785 Hyperlipidemia, unspecified: Secondary | ICD-10-CM | POA: Diagnosis not present

## 2013-05-12 DIAGNOSIS — I10 Essential (primary) hypertension: Secondary | ICD-10-CM | POA: Diagnosis not present

## 2013-05-12 DIAGNOSIS — D518 Other vitamin B12 deficiency anemias: Secondary | ICD-10-CM | POA: Diagnosis not present

## 2013-05-12 DIAGNOSIS — I509 Heart failure, unspecified: Secondary | ICD-10-CM | POA: Diagnosis not present

## 2013-05-12 DIAGNOSIS — K5900 Constipation, unspecified: Secondary | ICD-10-CM | POA: Diagnosis not present

## 2013-05-17 DIAGNOSIS — L8994 Pressure ulcer of unspecified site, stage 4: Secondary | ICD-10-CM | POA: Diagnosis not present

## 2013-05-17 DIAGNOSIS — L89109 Pressure ulcer of unspecified part of back, unspecified stage: Secondary | ICD-10-CM | POA: Diagnosis not present

## 2013-05-17 DIAGNOSIS — G8929 Other chronic pain: Secondary | ICD-10-CM | POA: Diagnosis not present

## 2013-05-20 DIAGNOSIS — L8994 Pressure ulcer of unspecified site, stage 4: Secondary | ICD-10-CM | POA: Diagnosis not present

## 2013-05-20 DIAGNOSIS — G8929 Other chronic pain: Secondary | ICD-10-CM | POA: Diagnosis not present

## 2013-05-20 DIAGNOSIS — L89109 Pressure ulcer of unspecified part of back, unspecified stage: Secondary | ICD-10-CM | POA: Diagnosis not present

## 2013-05-24 DIAGNOSIS — G8929 Other chronic pain: Secondary | ICD-10-CM | POA: Diagnosis not present

## 2013-05-24 DIAGNOSIS — L8994 Pressure ulcer of unspecified site, stage 4: Secondary | ICD-10-CM | POA: Diagnosis not present

## 2013-05-24 DIAGNOSIS — L89109 Pressure ulcer of unspecified part of back, unspecified stage: Secondary | ICD-10-CM | POA: Diagnosis not present

## 2013-05-27 DIAGNOSIS — L89109 Pressure ulcer of unspecified part of back, unspecified stage: Secondary | ICD-10-CM | POA: Diagnosis not present

## 2013-05-27 DIAGNOSIS — L8994 Pressure ulcer of unspecified site, stage 4: Secondary | ICD-10-CM | POA: Diagnosis not present

## 2013-05-27 DIAGNOSIS — G8929 Other chronic pain: Secondary | ICD-10-CM | POA: Diagnosis not present

## 2013-05-31 DIAGNOSIS — L8994 Pressure ulcer of unspecified site, stage 4: Secondary | ICD-10-CM | POA: Diagnosis not present

## 2013-05-31 DIAGNOSIS — G8929 Other chronic pain: Secondary | ICD-10-CM | POA: Diagnosis not present

## 2013-05-31 DIAGNOSIS — L89109 Pressure ulcer of unspecified part of back, unspecified stage: Secondary | ICD-10-CM | POA: Diagnosis not present

## 2013-06-03 DIAGNOSIS — IMO0001 Reserved for inherently not codable concepts without codable children: Secondary | ICD-10-CM | POA: Diagnosis not present

## 2013-06-07 DIAGNOSIS — L8994 Pressure ulcer of unspecified site, stage 4: Secondary | ICD-10-CM | POA: Diagnosis not present

## 2013-06-07 DIAGNOSIS — G8929 Other chronic pain: Secondary | ICD-10-CM | POA: Diagnosis not present

## 2013-06-07 DIAGNOSIS — L89109 Pressure ulcer of unspecified part of back, unspecified stage: Secondary | ICD-10-CM | POA: Diagnosis not present

## 2013-06-10 DIAGNOSIS — L89109 Pressure ulcer of unspecified part of back, unspecified stage: Secondary | ICD-10-CM | POA: Diagnosis not present

## 2013-06-10 DIAGNOSIS — L8994 Pressure ulcer of unspecified site, stage 4: Secondary | ICD-10-CM | POA: Diagnosis not present

## 2013-06-10 DIAGNOSIS — G8929 Other chronic pain: Secondary | ICD-10-CM | POA: Diagnosis not present

## 2013-06-15 DIAGNOSIS — E662 Morbid (severe) obesity with alveolar hypoventilation: Secondary | ICD-10-CM | POA: Diagnosis not present

## 2013-06-15 DIAGNOSIS — G4733 Obstructive sleep apnea (adult) (pediatric): Secondary | ICD-10-CM | POA: Diagnosis not present

## 2013-06-15 DIAGNOSIS — Z430 Encounter for attention to tracheostomy: Secondary | ICD-10-CM | POA: Diagnosis not present

## 2013-06-17 DIAGNOSIS — G8929 Other chronic pain: Secondary | ICD-10-CM | POA: Diagnosis not present

## 2013-06-17 DIAGNOSIS — L8994 Pressure ulcer of unspecified site, stage 4: Secondary | ICD-10-CM | POA: Diagnosis not present

## 2013-06-17 DIAGNOSIS — L89109 Pressure ulcer of unspecified part of back, unspecified stage: Secondary | ICD-10-CM | POA: Diagnosis not present

## 2013-06-24 DIAGNOSIS — G8929 Other chronic pain: Secondary | ICD-10-CM | POA: Diagnosis not present

## 2013-06-24 DIAGNOSIS — L89109 Pressure ulcer of unspecified part of back, unspecified stage: Secondary | ICD-10-CM | POA: Diagnosis not present

## 2013-06-24 DIAGNOSIS — L8994 Pressure ulcer of unspecified site, stage 4: Secondary | ICD-10-CM | POA: Diagnosis not present

## 2013-06-30 DIAGNOSIS — Z930 Tracheostomy status: Secondary | ICD-10-CM | POA: Diagnosis not present

## 2013-06-30 DIAGNOSIS — G4730 Sleep apnea, unspecified: Secondary | ICD-10-CM | POA: Diagnosis not present

## 2013-06-30 DIAGNOSIS — E119 Type 2 diabetes mellitus without complications: Secondary | ICD-10-CM | POA: Diagnosis not present

## 2013-07-20 DIAGNOSIS — I509 Heart failure, unspecified: Secondary | ICD-10-CM | POA: Diagnosis not present

## 2013-07-20 DIAGNOSIS — L89309 Pressure ulcer of unspecified buttock, unspecified stage: Secondary | ICD-10-CM | POA: Diagnosis not present

## 2013-07-20 DIAGNOSIS — E119 Type 2 diabetes mellitus without complications: Secondary | ICD-10-CM | POA: Diagnosis not present

## 2013-08-19 DIAGNOSIS — G4733 Obstructive sleep apnea (adult) (pediatric): Secondary | ICD-10-CM | POA: Diagnosis not present

## 2013-08-19 DIAGNOSIS — Z930 Tracheostomy status: Secondary | ICD-10-CM | POA: Diagnosis not present

## 2013-08-19 DIAGNOSIS — E662 Morbid (severe) obesity with alveolar hypoventilation: Secondary | ICD-10-CM | POA: Diagnosis not present

## 2013-09-02 DIAGNOSIS — G4710 Hypersomnia, unspecified: Secondary | ICD-10-CM | POA: Diagnosis not present

## 2013-09-02 DIAGNOSIS — J9500 Unspecified tracheostomy complication: Secondary | ICD-10-CM | POA: Diagnosis not present

## 2013-09-02 DIAGNOSIS — G4730 Sleep apnea, unspecified: Secondary | ICD-10-CM | POA: Diagnosis not present

## 2013-09-02 DIAGNOSIS — I509 Heart failure, unspecified: Secondary | ICD-10-CM | POA: Diagnosis not present

## 2013-10-01 DIAGNOSIS — G4710 Hypersomnia, unspecified: Secondary | ICD-10-CM | POA: Diagnosis not present

## 2013-10-01 DIAGNOSIS — J9500 Unspecified tracheostomy complication: Secondary | ICD-10-CM | POA: Diagnosis not present

## 2013-10-01 DIAGNOSIS — IMO0001 Reserved for inherently not codable concepts without codable children: Secondary | ICD-10-CM | POA: Diagnosis not present

## 2013-10-01 DIAGNOSIS — G4730 Sleep apnea, unspecified: Secondary | ICD-10-CM | POA: Diagnosis not present

## 2013-10-01 DIAGNOSIS — I10 Essential (primary) hypertension: Secondary | ICD-10-CM | POA: Diagnosis not present

## 2013-10-01 DIAGNOSIS — I509 Heart failure, unspecified: Secondary | ICD-10-CM | POA: Diagnosis not present

## 2013-10-29 DIAGNOSIS — G4730 Sleep apnea, unspecified: Secondary | ICD-10-CM | POA: Diagnosis not present

## 2013-10-29 DIAGNOSIS — I509 Heart failure, unspecified: Secondary | ICD-10-CM | POA: Diagnosis not present

## 2013-10-29 DIAGNOSIS — G4710 Hypersomnia, unspecified: Secondary | ICD-10-CM | POA: Diagnosis not present

## 2013-10-29 DIAGNOSIS — J9500 Unspecified tracheostomy complication: Secondary | ICD-10-CM | POA: Diagnosis not present

## 2013-11-26 ENCOUNTER — Emergency Department (HOSPITAL_COMMUNITY): Payer: Medicare Other

## 2013-11-26 ENCOUNTER — Emergency Department (HOSPITAL_COMMUNITY)
Admission: EM | Admit: 2013-11-26 | Discharge: 2013-11-26 | Disposition: A | Discharge status: Home / Self Care | Payer: Medicare Other | Source: Home / Self Care | Attending: Emergency Medicine | Admitting: Emergency Medicine

## 2013-11-26 DIAGNOSIS — J9620 Acute and chronic respiratory failure, unspecified whether with hypoxia or hypercapnia: Principal | ICD-10-CM | POA: Diagnosis present

## 2013-11-26 DIAGNOSIS — L89152 Pressure ulcer of sacral region, stage 2: Secondary | ICD-10-CM | POA: Diagnosis not present

## 2013-11-26 DIAGNOSIS — F17210 Nicotine dependence, cigarettes, uncomplicated: Secondary | ICD-10-CM | POA: Diagnosis present

## 2013-11-26 DIAGNOSIS — Z6841 Body Mass Index (BMI) 40.0 and over, adult: Secondary | ICD-10-CM

## 2013-11-26 DIAGNOSIS — F4320 Adjustment disorder, unspecified: Secondary | ICD-10-CM | POA: Diagnosis present

## 2013-11-26 DIAGNOSIS — I509 Heart failure, unspecified: Secondary | ICD-10-CM | POA: Insufficient documentation

## 2013-11-26 DIAGNOSIS — Z794 Long term (current) use of insulin: Secondary | ICD-10-CM

## 2013-11-26 DIAGNOSIS — G4730 Sleep apnea, unspecified: Secondary | ICD-10-CM | POA: Insufficient documentation

## 2013-11-26 DIAGNOSIS — Z930 Tracheostomy status: Secondary | ICD-10-CM

## 2013-11-26 DIAGNOSIS — Z720 Tobacco use: Secondary | ICD-10-CM

## 2013-11-26 DIAGNOSIS — R0602 Shortness of breath: Secondary | ICD-10-CM

## 2013-11-26 DIAGNOSIS — I10 Essential (primary) hypertension: Secondary | ICD-10-CM | POA: Diagnosis present

## 2013-11-26 DIAGNOSIS — L89159 Pressure ulcer of sacral region, unspecified stage: Secondary | ICD-10-CM | POA: Diagnosis present

## 2013-11-26 DIAGNOSIS — E662 Morbid (severe) obesity with alveolar hypoventilation: Secondary | ICD-10-CM | POA: Diagnosis present

## 2013-11-26 DIAGNOSIS — J069 Acute upper respiratory infection, unspecified: Secondary | ICD-10-CM

## 2013-11-26 DIAGNOSIS — I517 Cardiomegaly: Secondary | ICD-10-CM | POA: Diagnosis not present

## 2013-11-26 DIAGNOSIS — Z79899 Other long term (current) drug therapy: Secondary | ICD-10-CM | POA: Insufficient documentation

## 2013-11-26 DIAGNOSIS — J209 Acute bronchitis, unspecified: Secondary | ICD-10-CM | POA: Diagnosis present

## 2013-11-26 DIAGNOSIS — J449 Chronic obstructive pulmonary disease, unspecified: Secondary | ICD-10-CM | POA: Diagnosis present

## 2013-11-26 DIAGNOSIS — E1165 Type 2 diabetes mellitus with hyperglycemia: Secondary | ICD-10-CM | POA: Diagnosis present

## 2013-11-26 DIAGNOSIS — J811 Chronic pulmonary edema: Secondary | ICD-10-CM | POA: Diagnosis not present

## 2013-11-26 DIAGNOSIS — Z430 Encounter for attention to tracheostomy: Secondary | ICD-10-CM

## 2013-11-26 DIAGNOSIS — Z9981 Dependence on supplemental oxygen: Secondary | ICD-10-CM | POA: Insufficient documentation

## 2013-11-26 DIAGNOSIS — G4733 Obstructive sleep apnea (adult) (pediatric): Secondary | ICD-10-CM | POA: Diagnosis present

## 2013-11-26 DIAGNOSIS — Z23 Encounter for immunization: Secondary | ICD-10-CM

## 2013-11-26 DIAGNOSIS — I444 Left anterior fascicular block: Secondary | ICD-10-CM | POA: Diagnosis present

## 2013-11-26 DIAGNOSIS — Z9911 Dependence on respirator [ventilator] status: Secondary | ICD-10-CM

## 2013-11-26 LAB — CBC
HEMATOCRIT: 39.7 % (ref 39.0–52.0)
HEMOGLOBIN: 13.8 g/dL (ref 13.0–17.0)
MCH: 25.5 pg — ABNORMAL LOW (ref 26.0–34.0)
MCHC: 34.8 g/dL (ref 30.0–36.0)
MCV: 73.4 fL — ABNORMAL LOW (ref 78.0–100.0)
Platelets: 303 10*3/uL (ref 150–400)
RBC: 5.41 MIL/uL (ref 4.22–5.81)
RDW: 16.5 % — AB (ref 11.5–15.5)
WBC: 15.3 10*3/uL — AB (ref 4.0–10.5)

## 2013-11-26 LAB — BASIC METABOLIC PANEL
Anion gap: 11 (ref 5–15)
BUN: 11 mg/dL (ref 6–23)
CHLORIDE: 96 meq/L (ref 96–112)
CO2: 28 mEq/L (ref 19–32)
Calcium: 8.5 mg/dL (ref 8.4–10.5)
Creatinine, Ser: 0.85 mg/dL (ref 0.50–1.35)
GFR calc non Af Amer: >90 mL/min (ref >90)
GLUCOSE: 130 mg/dL — AB (ref 70–99)
POTASSIUM: 4.9 meq/L (ref 3.7–5.3)
Sodium: 135 mEq/L — ABNORMAL LOW (ref 137–147)

## 2013-11-26 LAB — I-STAT TROPONIN, ED: TROPONIN I, POC: 0.01 ng/mL (ref 0.00–0.08)

## 2013-11-26 LAB — PRO B NATRIURETIC PEPTIDE: PRO B NATRI PEPTIDE: 37.9 pg/mL (ref 0–125)

## 2013-11-26 MED ORDER — BENZONATATE 100 MG PO CAPS: 100.0000 mg | ORAL_CAPSULE | Freq: Three times a day (TID) | ORAL | Status: DC

## 2013-11-26 MED ORDER — HYDROCODONE-ACETAMINOPHEN 5-325 MG PO TABS: 1.0000 | ORAL_TABLET | Freq: Four times a day (QID) | ORAL | Status: DC | PRN

## 2013-11-26 MED ORDER — IPRATROPIUM-ALBUTEROL 0.5-2.5 (3) MG/3ML IN SOLN
3.0000 mL | Freq: Once | RESPIRATORY_TRACT | Status: AC
  Administered 2013-11-26: 3 mL via RESPIRATORY_TRACT
  Filled 2013-11-26: qty 3

## 2013-11-26 MED ORDER — HYDROCODONE-ACETAMINOPHEN 5-325 MG PO TABS
2.0000 | ORAL_TABLET | Freq: Once | ORAL | Status: AC
  Administered 2013-11-26: 2 via ORAL
  Filled 2013-11-26: qty 2

## 2013-11-26 MED ORDER — BENZONATATE 100 MG PO CAPS
200.0000 mg | ORAL_CAPSULE | Freq: Once | ORAL | Status: AC
Start: 2013-11-26 — End: 2013-11-26
  Administered 2013-11-26: 200 mg via ORAL
  Filled 2013-11-26: qty 2

## 2013-11-26 NOTE — ED Provider Notes (Signed)
CSN: [id number]     Arrival date & time 11/26/13  4650 History   First MD Initiated Contact with Patient 11/26/13 0912     Chief Complaint  Patient presents with  . Shortness of Breath     (Consider location/radiation/quality/duration/timing/severity/associated sxs/prior Treatment) HPI Comments: Patient reports increased cough, shortness of breath, nasal congestion and runny nose for 3 days. He has not had fevers, chills or body aches. He denies nausea vomiting or diarrhea. He sections his trach by himself at home and reports increased of yellow mucus. He does not wear oxygen at home. He has had the trach for 3 years. His no sick contacts. He does report that his sister died recently and that he has been "binging"  Patient is a 48 y.o. male presenting with shortness of breath.  Shortness of Breath Associated symptoms: cough   Associated symptoms: no abdominal pain, no chest pain, no fever, no headaches, no rash and no vomiting     Past Medical History  Diagnosis Date  . Hypertension   . CHF (congestive heart failure)   . Ventilator dependent   . Sleep apnea    Past Surgical History  Procedure Laterality Date  . Incision and drainage of wound  12/22/2010    Procedure: IRRIGATION AND DEBRIDEMENT WOUND;  Surgeon: Harl Bowie, MD;  Location: [facility];  Service: General;  Laterality: N/A;  patient on vent  . Wound debridement  12/21/2010    Procedure: DEBRIDEMENT WOUND;  Surgeon: Harl Bowie, MD;  Location: [facility];  Service: General;  Laterality: N/A;  Sacral  . Tracheostomy tube placement     No family history on file. History  Substance Use Topics  . Smoking status: Current Every Day Smoker    Types: Cigarettes  . Smokeless tobacco: Not on file  . Alcohol Use: No    Review of Systems  Constitutional: Negative for fever, activity change, appetite change and fatigue.  HENT: Positive for congestion and rhinorrhea. Negative for facial swelling and trouble swallowing.     Eyes: Negative for photophobia and pain.  Respiratory: Positive for cough and shortness of breath. Negative for chest tightness.   Cardiovascular: Negative for chest pain and leg swelling.  Gastrointestinal: Negative for nausea, vomiting, abdominal pain, diarrhea and constipation.  Endocrine: Negative for polydipsia and polyuria.  Genitourinary: Negative for dysuria, urgency, decreased urine volume and difficulty urinating.  Musculoskeletal: Negative for back pain and gait problem.  Skin: Negative for color change, rash and wound.  Allergic/Immunologic: Negative for immunocompromised state.  Neurological: Negative for dizziness, facial asymmetry, speech difficulty, weakness, numbness and headaches.  Psychiatric/Behavioral: Negative for confusion, decreased concentration and agitation.      Allergies  Tramadol and Ibuprofen  Home Medications   Prior to Admission medications   Medication Sig Start Date End Date Taking? Authorizing Provider  albuterol (PROVENTIL) (2.5 MG/3ML) 0.083% nebulizer solution Take 2.5 mg by nebulization every 6 (six) hours as needed for wheezing or shortness of breath.   Yes Historical Provider, MD  HYDROcodone-acetaminophen (NORCO/VICODIN) 5-325 MG per tablet Take 1 tablet by mouth every 4 (four) hours as needed for moderate pain.   Yes Historical Provider, MD  losartan (COZAAR) 100 MG tablet Take 100 mg by mouth daily.   Yes Historical Provider, MD  acetaminophen (TYLENOL) 500 MG tablet Take 1,000 mg by mouth 3 (three) times daily as needed. For pain      Historical Provider, MD  antiseptic oral rinse (BIOTENE) LIQD 15 mLs by Mouth Rinse route  QID. 12/25/10   Donita Brooks, NP  benzonatate (TESSALON) 100 MG capsule Take 1 capsule (100 mg total) by mouth every 8 (eight) hours. 11/26/13   Ernestina Patches, MD  docusate sodium (COLACE) 100 MG capsule Take 100 mg by mouth 2 (two) times daily.      Historical Provider, MD  feeding supplement (PRO-STAT SUGAR FREE 64)  LIQD Place 30 mLs into feeding tube every 4 (four) hours. 12/25/10   Donita Brooks, NP  HYDROcodone-acetaminophen (NORCO) 5-325 MG per tablet Take 1-2 tablets by mouth every 6 (six) hours as needed. 11/26/13   Ernestina Patches, MD  hydroxypropyl methylcellulose (ISOPTO TEARS) 2.5 % ophthalmic solution Apply 1 drop to eye every 4 (four) hours as needed. For eyes     Historical Provider, MD  ibuprofen (ADVIL,MOTRIN) 800 MG tablet Take 800 mg by mouth every 8 (eight) hours as needed.    Historical Provider, MD  senna (SENOKOT) 8.6 MG TABS Take 2 tablets (17.2 mg total) by mouth 2 (two) times daily. 12/25/10   Donita Brooks, NP   BP 140/91  Pulse 83  Temp(Src) 98.2 F (36.8 C) (Oral)  Resp 29  SpO2 91% Physical Exam  Constitutional: He is oriented to person, place, and time. He appears well-developed and well-nourished. No distress.  Morbid obesity  HENT:  Head: Normocephalic and atraumatic.  Mouth/Throat: No oropharyngeal exudate.  Eyes: Pupils are equal, round, and reactive to light.  Neck: Normal range of motion. Neck supple.  Trach in place  Cardiovascular: Normal rate, regular rhythm and normal heart sounds.  Exam reveals no gallop and no friction rub.   No murmur heard. Pulmonary/Chest: Effort normal and breath sounds normal. No respiratory distress. He has no wheezes. He has no rales.  Frequent bronchospastic coughing  Abdominal: Soft. Bowel sounds are normal. He exhibits no distension and no mass. There is no tenderness. There is no rebound and no guarding.  Musculoskeletal: Normal range of motion. He exhibits no edema and no tenderness.       Back:  Neurological: He is alert and oriented to person, place, and time.  Skin: Skin is warm and dry.  Psychiatric: He has a normal mood and affect.    ED Course  Procedures (including critical care time) Labs Review Labs Reviewed  CBC - Abnormal; Notable for the following:    WBC 15.3 (*)    MCV 73.4 (*)    MCH 25.5 (*)    RDW  16.5 (*)    All other components within normal limits  BASIC METABOLIC PANEL - Abnormal; Notable for the following:    Sodium 135 (*)    Glucose, Bld 130 (*)    All other components within normal limits  PRO B NATRIURETIC PEPTIDE  I-STAT TROPOININ, ED    Imaging Review Dg Chest Port 1 View  11/26/2013   CLINICAL DATA:  Shortness of breath.  EXAM: PORTABLE CHEST - 1 VIEW  COMPARISON:  December 22, 2012.  FINDINGS: Stable cardiomegaly. Tracheostomy is unchanged in position. Stable mild central pulmonary vascular congestion is noted. No pneumothorax or pleural effusion is noted. Bony thorax is intact.  IMPRESSION: Stable cardiomegaly and mild central pulmonary vascular congestion. No significant change compared to prior exam.   Electronically Signed   By: Sabino Dick M.D.   On: 11/26/2013 09:38     EKG Interpretation   Date/Time:  Friday November 26 2013 08:57:47 EDT Ventricular Rate:  84 PR Interval:  170 QRS Duration: 88 QT Interval:  373 QTC Calculation: 441 R Axis:   -119 Text Interpretation:  Sinus rhythm Left anterior fascicular block Low  voltage, extremity and precordial leads Probable anteroseptal infarct, old  Nonspecific T abnormalities, lateral leads No significant change since  last tracing Confirmed by [place]  MD, Onyx [phone]) on 11/26/2013  10:24:23 AM      MDM   Final diagnoses:  URI (upper respiratory infection)  Sacral decubitus ulcer, stage II    Pt is a 48 y.o. male with Pmhx as above including chronic respiratory failure with a trach who presents with 3 days of increased shortness of breath, increased sputum, rhinorrhea, nasal congestion and cough. No fever, chills, myalgias. No sick contacts. On physical exam he has some migratory rales that clear with coughing. He has a chronic healing decubitus ulcer that does not appear acutely infected.   Chest x-ray with no acute findings. BNP is not significantly elevated. WBC mildly elevated but not increased  from prior. Troponin not elevated. Patient reports he is feeling somewhat better after breathing treatment. On repeat pulmonary exam his lungs are clear he does continue to have bronchospastic coughing. I suspect he has an acute viral URI. Discharged home with instructions for supportive care as well as prescription for Tessalon. He has requested that I refill his chronic Norco prescription until he can see his PCP at the end of the month. At prescription is for the chronic decubitus ulcer. I have requested that he reestablish with the wound clinic for treatment of this ulcer. Return precautions given for new or worsening symptoms including fever, worsening shortness of breath, chest pain.     Ernestina Patches, MD 11/26/13 1113

## 2013-11-26 NOTE — Discharge Instructions (Signed)
Upper Respiratory Infection, Adult An upper respiratory infection (URI) is also known as the common cold. It is often caused by a type of germ (virus). Colds are easily spread (contagious). You can pass it to others by kissing, coughing, sneezing, or drinking out of the same glass. Usually, you get better in 1 or 2 weeks.  HOME CARE   Only take medicine as told by your doctor.  Use a warm mist humidifier or breathe in steam from a hot shower.  Drink enough water and fluids to keep your pee (urine) clear or pale yellow.  Get plenty of rest.  Return to work when your temperature is back to normal or as told by your doctor. You may use a face mask and wash your hands to stop your cold from spreading. GET HELP RIGHT AWAY IF:   After the first few days, you feel you are getting worse.  You have questions about your medicine.  You have chills, shortness of breath, or brown or red spit (mucus).  You have yellow or brown snot (nasal discharge) or pain in the face, especially when you bend forward.  You have a fever, puffy (swollen) neck, pain when you swallow, or white spots in the back of your throat.  You have a bad headache, ear pain, sinus pain, or chest pain.  You have a high-pitched whistling sound when you breathe in and out (wheezing).  You have a lasting cough or cough up blood.  You have sore muscles or a stiff neck. MAKE SURE YOU:   Understand these instructions.  Will watch your condition.  Will get help right away if you are not doing well or get worse. Document Released: 07/10/2007 Document Revised: 04/15/2011 Document Reviewed: 04/28/2013 Exit[employer] Patient Information 2015 [employer]. This information is not intended to replace advice given to you by your health care provider. Make sure you discuss any questions you have with your health care provider.  

## 2013-11-26 NOTE — Progress Notes (Signed)
Rt called to [facility] for a trach pt with SOB.  Trach collar setup at 28% ,suction pt thick white sputum, neb given, MD at bedside. Rt will cont to follow pt as need. Pt has #6 shiley xlt.

## 2013-11-26 NOTE — ED Notes (Signed)
Patient presents today with a chief complaint of cough and dyspnea since Wednesday this week with worsening dyspnea this morning around 2. Patient has trach and self suctions at home. He reports he noticed yellowish mucous coming from trach which caused concern. Patient speaking in short, fragmented sentences.

## 2013-11-26 NOTE — Progress Notes (Signed)
Trach ck done, pt doing well at this time, vitals stable. Pt stated he is being D/C and that he felt much better.

## 2013-11-27 ENCOUNTER — Encounter (HOSPITAL_COMMUNITY): Payer: Self-pay | Admitting: Emergency Medicine

## 2013-11-27 ENCOUNTER — Inpatient Hospital Stay (HOSPITAL_COMMUNITY)
Admission: EM | Admit: 2013-11-27 | Discharge: 2013-12-02 | Disposition: A | Discharge status: Skilled Nursing Facility | Payer: Medicare Other | Attending: Internal Medicine | Admitting: Internal Medicine

## 2013-11-27 ENCOUNTER — Emergency Department (HOSPITAL_COMMUNITY): Payer: Medicare Other

## 2013-11-27 DIAGNOSIS — E119 Type 2 diabetes mellitus without complications: Secondary | ICD-10-CM

## 2013-11-27 DIAGNOSIS — I517 Cardiomegaly: Secondary | ICD-10-CM | POA: Diagnosis not present

## 2013-11-27 DIAGNOSIS — E870 Hyperosmolality and hypernatremia: Secondary | ICD-10-CM

## 2013-11-27 DIAGNOSIS — R0602 Shortness of breath: Secondary | ICD-10-CM | POA: Diagnosis not present

## 2013-11-27 DIAGNOSIS — E1165 Type 2 diabetes mellitus with hyperglycemia: Secondary | ICD-10-CM

## 2013-11-27 DIAGNOSIS — L89150 Pressure ulcer of sacral region, unstageable: Secondary | ICD-10-CM

## 2013-11-27 DIAGNOSIS — D472 Monoclonal gammopathy: Secondary | ICD-10-CM

## 2013-11-27 DIAGNOSIS — J40 Bronchitis, not specified as acute or chronic: Secondary | ICD-10-CM

## 2013-11-27 DIAGNOSIS — Z609 Problem related to social environment, unspecified: Secondary | ICD-10-CM

## 2013-11-27 DIAGNOSIS — I444 Left anterior fascicular block: Secondary | ICD-10-CM

## 2013-11-27 DIAGNOSIS — I509 Heart failure, unspecified: Secondary | ICD-10-CM | POA: Diagnosis not present

## 2013-11-27 DIAGNOSIS — J9621 Acute and chronic respiratory failure with hypoxia: Secondary | ICD-10-CM | POA: Diagnosis not present

## 2013-11-27 DIAGNOSIS — Z9911 Dependence on respirator [ventilator] status: Secondary | ICD-10-CM | POA: Diagnosis not present

## 2013-11-27 DIAGNOSIS — J069 Acute upper respiratory infection, unspecified: Secondary | ICD-10-CM | POA: Diagnosis not present

## 2013-11-27 DIAGNOSIS — J811 Chronic pulmonary edema: Secondary | ICD-10-CM | POA: Diagnosis not present

## 2013-11-27 DIAGNOSIS — R739 Hyperglycemia, unspecified: Secondary | ICD-10-CM

## 2013-11-27 DIAGNOSIS — L89152 Pressure ulcer of sacral region, stage 2: Secondary | ICD-10-CM | POA: Diagnosis not present

## 2013-11-27 DIAGNOSIS — L89159 Pressure ulcer of sacral region, unspecified stage: Secondary | ICD-10-CM

## 2013-11-27 DIAGNOSIS — E662 Morbid (severe) obesity with alveolar hypoventilation: Secondary | ICD-10-CM

## 2013-11-27 DIAGNOSIS — J9620 Acute and chronic respiratory failure, unspecified whether with hypoxia or hypercapnia: Secondary | ICD-10-CM | POA: Diagnosis not present

## 2013-11-27 DIAGNOSIS — J449 Chronic obstructive pulmonary disease, unspecified: Secondary | ICD-10-CM | POA: Diagnosis not present

## 2013-11-27 DIAGNOSIS — I10 Essential (primary) hypertension: Secondary | ICD-10-CM | POA: Diagnosis not present

## 2013-11-27 DIAGNOSIS — F17210 Nicotine dependence, cigarettes, uncomplicated: Secondary | ICD-10-CM | POA: Diagnosis not present

## 2013-11-27 DIAGNOSIS — Z659 Problem related to unspecified psychosocial circumstances: Secondary | ICD-10-CM

## 2013-11-27 LAB — CBC WITH DIFFERENTIAL/PLATELET
BASOS ABS: 0 10*3/uL (ref 0.0–0.1)
BASOS PCT: 0 % (ref 0–1)
EOS PCT: 4 % (ref 0–5)
Eosinophils Absolute: 0.6 10*3/uL (ref 0.0–0.7)
HEMATOCRIT: 40.7 % (ref 39.0–52.0)
Hemoglobin: 14.3 g/dL (ref 13.0–17.0)
Lymphocytes Relative: 25 % (ref 12–46)
Lymphs Abs: 3.6 10*3/uL (ref 0.7–4.0)
MCH: 26.2 pg (ref 26.0–34.0)
MCHC: 35.1 g/dL (ref 30.0–36.0)
MCV: 74.5 fL — AB (ref 78.0–100.0)
MONO ABS: 1.6 10*3/uL — AB (ref 0.1–1.0)
Monocytes Relative: 11 % (ref 3–12)
NEUTROS ABS: 8.7 10*3/uL — AB (ref 1.7–7.7)
Neutrophils Relative %: 60 % (ref 43–77)
PLATELETS: 307 10*3/uL (ref 150–400)
RBC: 5.46 MIL/uL (ref 4.22–5.81)
RDW: 16.7 % — AB (ref 11.5–15.5)
WBC: 14.5 10*3/uL — ABNORMAL HIGH (ref 4.0–10.5)

## 2013-11-27 LAB — COMPREHENSIVE METABOLIC PANEL
ALBUMIN: 3.3 g/dL — AB (ref 3.5–5.2)
ALT: 20 U/L (ref 0–53)
AST: 17 U/L (ref 0–37)
Alkaline Phosphatase: 78 U/L (ref 39–117)
Anion gap: 11 (ref 5–15)
BUN: 14 mg/dL (ref 6–23)
CALCIUM: 8.9 mg/dL (ref 8.4–10.5)
CO2: 27 mEq/L (ref 19–32)
Chloride: 101 mEq/L (ref 96–112)
Creatinine, Ser: 1.06 mg/dL (ref 0.50–1.35)
GFR calc Af Amer: >90 mL/min (ref >90)
GFR calc non Af Amer: 81 mL/min — ABNORMAL LOW (ref >90)
Glucose, Bld: 143 mg/dL — ABNORMAL HIGH (ref 70–99)
Potassium: 4.2 mEq/L (ref 3.7–5.3)
Sodium: 139 mEq/L (ref 137–147)
Total Bilirubin: 0.5 mg/dL (ref 0.3–1.2)
Total Protein: 7.5 g/dL (ref 6.0–8.3)

## 2013-11-27 LAB — I-STAT TROPONIN, ED: TROPONIN I, POC: 0 ng/mL (ref 0.00–0.08)

## 2013-11-27 LAB — PRO B NATRIURETIC PEPTIDE: PRO B NATRI PEPTIDE: 41.6 pg/mL (ref 0–125)

## 2013-11-27 LAB — MRSA PCR SCREENING: MRSA by PCR: NEGATIVE

## 2013-11-27 MED ORDER — BENZONATATE 100 MG PO CAPS
100.0000 mg | ORAL_CAPSULE | Freq: Three times a day (TID) | ORAL | Status: DC
  Administered 2013-11-28: 100 mg via ORAL
  Filled 2013-11-27 (×4): qty 1

## 2013-11-27 MED ORDER — AZITHROMYCIN 500 MG PO TABS
500.0000 mg | ORAL_TABLET | Freq: Every day | ORAL | Status: AC
  Administered 2013-11-27: 500 mg via ORAL
  Filled 2013-11-27: qty 1

## 2013-11-27 MED ORDER — HYDROCODONE-ACETAMINOPHEN 5-325 MG PO TABS
1.0000 | ORAL_TABLET | Freq: Four times a day (QID) | ORAL | Status: DC | PRN
  Administered 2013-11-27: 1 via ORAL
  Administered 2013-11-28 (×2): 2 via ORAL
  Administered 2013-11-28: 1 via ORAL
  Administered 2013-11-29 – 2013-12-02 (×7): 2 via ORAL
  Filled 2013-11-27 (×9): qty 2
  Filled 2013-11-27 (×2): qty 1

## 2013-11-27 MED ORDER — ONDANSETRON HCL 4 MG/2ML IJ SOLN
4.0000 mg | Freq: Four times a day (QID) | INTRAMUSCULAR | Status: DC | PRN
  Administered 2013-11-30: 4 mg via INTRAVENOUS
  Filled 2013-11-27: qty 2

## 2013-11-27 MED ORDER — ALBUTEROL SULFATE (2.5 MG/3ML) 0.083% IN NEBU
5.0000 mg | INHALATION_SOLUTION | Freq: Once | RESPIRATORY_TRACT | Status: AC
  Administered 2013-11-27: 5 mg via RESPIRATORY_TRACT
  Filled 2013-11-27: qty 6

## 2013-11-27 MED ORDER — ALBUTEROL SULFATE (2.5 MG/3ML) 0.083% IN NEBU
2.5000 mg | INHALATION_SOLUTION | RESPIRATORY_TRACT | Status: DC | PRN
Start: 2013-11-27 — End: 2013-12-02
  Filled 2013-11-27: qty 3

## 2013-11-27 MED ORDER — ENOXAPARIN SODIUM 100 MG/ML ~~LOC~~ SOLN
0.5000 mg/kg | SUBCUTANEOUS | Status: DC
  Administered 2013-11-27 – 2013-12-01 (×5): 95 mg via SUBCUTANEOUS
  Filled 2013-11-27 (×6): qty 1

## 2013-11-27 MED ORDER — ONDANSETRON HCL 4 MG PO TABS
4.0000 mg | ORAL_TABLET | Freq: Four times a day (QID) | ORAL | Status: DC | PRN
  Administered 2013-11-28: 4 mg via ORAL
  Filled 2013-11-27: qty 1

## 2013-11-27 MED ORDER — ALUM & MAG HYDROXIDE-SIMETH 200-200-20 MG/5ML PO SUSP
30.0000 mL | Freq: Four times a day (QID) | ORAL | Status: DC | PRN
  Administered 2013-11-30 – 2013-12-01 (×3): 30 mL via ORAL
  Filled 2013-11-27 (×3): qty 30

## 2013-11-27 MED ORDER — GUAIFENESIN ER 600 MG PO TB12
600.0000 mg | ORAL_TABLET | Freq: Two times a day (BID) | ORAL | Status: DC
  Administered 2013-11-27 – 2013-12-02 (×10): 600 mg via ORAL
  Filled 2013-11-27 (×10): qty 1

## 2013-11-27 MED ORDER — SODIUM CHLORIDE 0.9 % IJ SOLN
3.0000 mL | Freq: Two times a day (BID) | INTRAMUSCULAR | Status: DC
  Administered 2013-11-27 – 2013-11-30 (×6): 3 mL via INTRAVENOUS

## 2013-11-27 MED ORDER — CETYLPYRIDINIUM CHLORIDE 0.05 % MT LIQD
7.0000 mL | Freq: Two times a day (BID) | OROMUCOSAL | Status: DC
  Administered 2013-11-28 – 2013-12-01 (×7): 7 mL via OROMUCOSAL

## 2013-11-27 MED ORDER — CHLORHEXIDINE GLUCONATE 0.12 % MT SOLN
15.0000 mL | Freq: Two times a day (BID) | OROMUCOSAL | Status: DC
  Administered 2013-11-27 – 2013-12-02 (×10): 15 mL via OROMUCOSAL
  Filled 2013-11-27 (×10): qty 15

## 2013-11-27 MED ORDER — SODIUM CHLORIDE 0.9 % IJ SOLN
3.0000 mL | INTRAMUSCULAR | Status: DC | PRN
Start: 2013-11-27 — End: 2013-11-30
  Administered 2013-11-28: 3 mL via INTRAVENOUS

## 2013-11-27 MED ORDER — IPRATROPIUM-ALBUTEROL 0.5-2.5 (3) MG/3ML IN SOLN
3.0000 mL | Freq: Four times a day (QID) | RESPIRATORY_TRACT | Status: DC
  Administered 2013-11-27 – 2013-12-02 (×19): 3 mL via RESPIRATORY_TRACT
  Filled 2013-11-27 (×19): qty 3

## 2013-11-27 MED ORDER — SODIUM CHLORIDE 0.9 % IV SOLN: 250.0000 mL | INTRAVENOUS | Status: DC | PRN

## 2013-11-27 MED ORDER — AZITHROMYCIN 250 MG PO TABS
250.0000 mg | ORAL_TABLET | Freq: Every day | ORAL | Status: AC
  Administered 2013-11-28 – 2013-12-01 (×4): 250 mg via ORAL
  Filled 2013-11-27 (×4): qty 1

## 2013-11-27 MED ORDER — ALBUTEROL SULFATE (2.5 MG/3ML) 0.083% IN NEBU: 2.5000 mg | INHALATION_SOLUTION | Freq: Four times a day (QID) | RESPIRATORY_TRACT | Status: DC | PRN

## 2013-11-27 NOTE — H&P (Signed)
History and Physical:    [name] YKD:[id number] DOB: [birth year] DOA: 11/27/2013  Referring physician: Dr. Tamera Punt PCP: Philis Fendt, MD   Chief Complaint: Dyspnea  History of Present Illness:   [name] is an 48 y.o. male with a PMH of chronic respiratory failure s/p trach, OSA/OHS, COPD, CHF/EF 60-65% on last Echo 01/09/10 who presented to the ER with a 3 day h/o worsening dypnea, change in sputum (thicker, yellower) and pleuritic type chest pain. The patient tells me further more, that he's been "stressed "and "depressed ". He tells me that he "buried his sister " 2 weeks ago after she refused further hemodialysis. He lost his brother 1 year ago to cancer. On top of the stressors, the apartment that he has been living in this been condemned and his home health aide is unable to provide him care there. Although he has sought the assistance of [place], he has been unable to secure any assistance, and his SSI check is only for $600 and he uses $485 to pay his rent. He is only able to pay his electric bill every other month, and sepsis on a diet mostly consisting of Ramen noodles and whatever assistance he can secure from local food banks. Patient was seen in the ER 11/26/13 with similar symptoms, felt to have a viral URI, and discharged home with a prescription for Tessalon Perles and Norco for pain secondary to a decubitus ulcer.  ROS:   Constitutional: No fever, no chills;  Appetite normal; No weight loss, no weight gain, + fatigue.  HEENT: No blurry vision, no diplopia, no pharyngitis, no dysphagia CV: No chest pain, no palpitations, no PND, no orthopnea, no edema.  Resp: + SOB, no cough, + pleuritic pain. GI: No nausea, no vomiting, no diarrhea, no melena, no hematochezia, no constipation, no abdominal pain.  GU: No dysuria, no hematuria, no frequency, no urgency. MSK: no myalgias, no arthralgias.  Neuro:  No headache, no focal neurological deficits, no  history of seizures.  Psych: + depression, + anxiety.  Endo: No heat intolerance, no cold intolerance, no polyuria, no polydipsia  Skin: No rashes, no skin lesions.  Heme: No easy bruising.  Travel history: No recent travel.   Past Medical History:   Past Medical History  Diagnosis Date  . Hypertension   . CHF (congestive heart failure)   . Ventilator dependent   . Sleep apnea     Past Surgical History:   Past Surgical History  Procedure Laterality Date  . Incision and drainage of wound  12/22/2010    Procedure: IRRIGATION AND DEBRIDEMENT WOUND;  Surgeon: Harl Bowie, MD;  Location: [facility];  Service: General;  Laterality: N/A;  patient on vent  . Wound debridement  12/21/2010    Procedure: DEBRIDEMENT WOUND;  Surgeon: Harl Bowie, MD;  Location: [facility];  Service: General;  Laterality: N/A;  Sacral  . Tracheostomy tube placement      Social History:   History   Social History  . Marital Status: Single    Spouse Name: N/A    Number of Children: N/A  . Years of Education: N/A   Occupational History  . Disabled    Social History Main Topics  . Smoking status: Current Every Day Smoker    Types: Cigarettes  . Smokeless tobacco: Not on file  . Alcohol Use: No  . Drug Use: No  . Sexual Activity: Not on file   Other  Topics Concern  . Not on file   Social History Narrative   Single. Currently living in a condemned house.    Family history:   Family History  Problem Relation Age of Onset  . Cancer Brother   . Chronic Renal Failure Sister     Allergies   Tramadol and Ibuprofen  Current Medications:   Prior to Admission medications   Medication Sig Start Date End Date Taking? Authorizing Provider  albuterol (PROVENTIL) (2.5 MG/3ML) 0.083% nebulizer solution Take 2.5 mg by nebulization every 6 (six) hours as needed for wheezing or shortness of breath.   Yes Historical Provider, MD  benzonatate (TESSALON) 100 MG capsule Take 1 capsule (100 mg total)  by mouth every 8 (eight) hours. 11/26/13  Yes Ernestina Patches, MD  HYDROcodone-acetaminophen (NORCO) 5-325 MG per tablet Take 1-2 tablets by mouth every 6 (six) hours as needed. 11/26/13  Yes Ernestina Patches, MD  losartan (COZAAR) 100 MG tablet Take 100 mg by mouth daily as needed (for high blood pressure).    Yes Historical Provider, MD    Physical Exam:   Filed Vitals:   11/27/13 1159 11/27/13 1200 11/27/13 1300 11/27/13 1303  BP: 141/74  113/56 113/56  Pulse: 89 88 93 92  Resp: 18 16 16 18   SpO2: 99% 98% 97% 98%     Physical Exam: Blood pressure 113/56, pulse 92, resp. rate 18, SpO2 98.00%. Gen: Mild distress secondary to coughing spasms. Head: Normocephalic, atraumatic. Eyes: PERRL, EOMI, sclerae nonicteric. Mouth: Oropharynx clear with fair dentition. Neck: Supple, no thyromegaly, no lymphadenopathy, no jugular venous distention. Tracheostomy tube in place.  Chest: Lungs with scattered expiratory wheezes and rhonchi. CV: Heart sounds are regular. No murmurs, rubs, or gallops. Abdomen: Soft, nontender, nondistended with normal active bowel sounds. Extremities: Extremities are with some edema but no cyanosis. Skin: Warm and dry. Neuro: Alert and oriented times 3; cranial nerves II through XII grossly intact. Psych: Mood and affect tearful and depressed.   Data Review:    Labs: Basic Metabolic Panel:  Recent Labs Lab 11/26/13 0913 11/27/13 0904  NA 135* 139  K 4.9 4.2  CL 96 101  CO2 28 27  GLUCOSE 130* 143*  BUN 11 14  CREATININE 0.85 1.06  CALCIUM 8.5 8.9   Liver Function Tests:  Recent Labs Lab 11/27/13 0904  AST 17  ALT 20  ALKPHOS 78  BILITOT 0.5  PROT 7.5  ALBUMIN 3.3*   CBC:  Recent Labs Lab 11/26/13 0913 11/27/13 0904  WBC 15.3* 14.5*  NEUTROABS  --  8.7*  HGB 13.8 14.3  HCT 39.7 40.7  MCV 73.4* 74.5*  PLT 303 307   BNP (last 3 results)  Recent Labs  11/26/13 0913 11/27/13 0904  PROBNP 37.9 41.6    Radiographic Studies: Dg  Chest Port 1 View  11/27/2013   CLINICAL DATA:  Shortness of breath today, initial evaluation, personal history of congestive heart failure and hypertension as well as smoking  EXAM: PORTABLE CHEST - 1 VIEW  COMPARISON:  11/26/2013, 12/22/2012  FINDINGS: Mild stable cardiac enlargement. Mild central pulmonary vascular congestion is stable from 11/26/2013 but slightly more prominent when compared to 12/22/2012. No focal consolidation or effusion. Mild perihilar peribronchial wall thickening. Minimal interstitial changes. Tracheostomy tube unchanged.  IMPRESSION: Study somewhat limited by patient body habitus stable cardiac enlargement and vascular congestion when compared to 11/26/2013, with vascular congestion slightly more prominent when compared to 12/22/2012. There may be minimal interstitial pulmonary edema.   Electronically Signed  By: Skipper Cliche M.D.   On: 11/27/2013 10:01   Dg Chest Port 1 View  11/26/2013   CLINICAL DATA:  Shortness of breath.  EXAM: PORTABLE CHEST - 1 VIEW  COMPARISON:  December 22, 2012.  FINDINGS: Stable cardiomegaly. Tracheostomy is unchanged in position. Stable mild central pulmonary vascular congestion is noted. No pneumothorax or pleural effusion is noted. Bony thorax is intact.  IMPRESSION: Stable cardiomegaly and mild central pulmonary vascular congestion. No significant change compared to prior exam.   Electronically Signed   By: Sabino Dick M.D.   On: 11/26/2013 09:38    EKG: Independently reviewed. Sinus rhythm at 86 beats per minute with a left anterior fascicular block.   Assessment/Plan:   Principal Problem:   Acute on chronic respiratory failure with acute bronchitis  Admit for observation.  Oxygen via trach.  Provide nebulized bronchodilator therapy 4 times a day and every 2 hours as needed.  Chest x-ray clear, but with change in sputum, would treat with a Z-Pak.  Lack of BNP elevation rules out CHF as an underlying etiology of his  respiratory failure.  Active Problems:   Poor social situation  SW and CM consultations.    Obesity hypoventilation syndrome  Tracheostomy dependent.    Decubitus ulcer of sacral area  Wound care RN to evaluate.    Left anterior fascicular block  Likely benign, but places the patient at risk for developing atrial fibrillation.    DVT prophylaxis  Lovenox ordered.  Code Status: Full. Family Communication: No family at bedside. Disposition Plan: Home when stable.  Time spent: 75 minutes.  RAMA,CHRISTINA [facility] Pager [phone] Cell: [phone]   If 7PM-7AM, please contact night-coverage [email or web address] Password [email or web address] 11/27/2013, 3:13 PM

## 2013-11-27 NOTE — ED Notes (Signed)
MD at bedside. 

## 2013-11-27 NOTE — ED Provider Notes (Signed)
CSN: [id number]     Arrival date & time 11/27/13  9629 History   First MD Initiated Contact with Patient 11/27/13 [phone]     Chief Complaint  Patient presents with  . Shortness of Breath  . Chest Pain     (Consider location/radiation/quality/duration/timing/severity/associated sxs/prior Treatment) HPI Comments: Patient presents with shortness of breath. He's an obese male who is straight dependent. He states over the last 2-3 days she's had increased chest congestion and shortness of breath. He's had increased mucus production from his trach. He self suctions his trach and has noticed some yellow mucus in his trach. He he's had increased coughing as well. He has been across his chest relieved coughing. He states only hurts when he coughs. He denies any fevers. He does report a little bit of increase in leg swelling. He states he uses oxygen at home as needed. He also is supposed to be using the vent at night but he is using this inconsistently. He was seen here yesterday for similar symptoms. He had a negative chest x-ray and was discharged with Tessalon pearls. He states initially was doing better but this morning had increased shortness of breath.  Patient is a 48 y.o. male presenting with shortness of breath and chest pain.  Shortness of Breath Associated symptoms: chest pain and cough   Associated symptoms: no abdominal pain, no diaphoresis, no fever, no headaches, no rash and no vomiting   Chest Pain Associated symptoms: cough, fatigue and shortness of breath   Associated symptoms: no abdominal pain, no back pain, no diaphoresis, no dizziness, no fever, no headache, no nausea, no numbness, not vomiting and no weakness     Past Medical History  Diagnosis Date  . Hypertension   . CHF (congestive heart failure)   . Ventilator dependent   . Sleep apnea    Past Surgical History  Procedure Laterality Date  . Incision and drainage of wound  12/22/2010    Procedure: IRRIGATION AND  DEBRIDEMENT WOUND;  Surgeon: Harl Bowie, MD;  Location: [facility];  Service: General;  Laterality: N/A;  patient on vent  . Wound debridement  12/21/2010    Procedure: DEBRIDEMENT WOUND;  Surgeon: Harl Bowie, MD;  Location: [facility];  Service: General;  Laterality: N/A;  Sacral  . Tracheostomy tube placement     No family history on file. History  Substance Use Topics  . Smoking status: Current Every Day Smoker    Types: Cigarettes  . Smokeless tobacco: Not on file  . Alcohol Use: No    Review of Systems  Constitutional: Positive for fatigue. Negative for fever, chills and diaphoresis.  HENT: Positive for congestion and rhinorrhea. Negative for sneezing.   Eyes: Negative.   Respiratory: Positive for cough and shortness of breath. Negative for chest tightness.   Cardiovascular: Positive for chest pain and leg swelling.  Gastrointestinal: Negative for nausea, vomiting, abdominal pain, diarrhea and blood in stool.  Genitourinary: Negative for frequency, hematuria, flank pain and difficulty urinating.  Musculoskeletal: Negative for arthralgias and back pain.  Skin: Negative for rash.  Neurological: Negative for dizziness, speech difficulty, weakness, numbness and headaches.      Allergies  Tramadol and Ibuprofen  Home Medications   Prior to Admission medications   Medication Sig Start Date End Date Taking? Authorizing Provider  albuterol (PROVENTIL) (2.5 MG/3ML) 0.083% nebulizer solution Take 2.5 mg by nebulization every 6 (six) hours as needed for wheezing or shortness of breath.   Yes Historical Provider, MD  benzonatate (TESSALON) 100 MG capsule Take 1 capsule (100 mg total) by mouth every 8 (eight) hours. 11/26/13  Yes Ernestina Patches, MD  HYDROcodone-acetaminophen (NORCO) 5-325 MG per tablet Take 1-2 tablets by mouth every 6 (six) hours as needed. 11/26/13  Yes Ernestina Patches, MD  losartan (COZAAR) 100 MG tablet Take 100 mg by mouth daily as needed (for high blood  pressure).    Yes Historical Provider, MD   BP 113/56  Pulse 92  Resp 18  SpO2 98% Physical Exam  Constitutional: He is oriented to person, place, and time. He appears well-developed and well-nourished. He appears distressed.  Morbidly obese. Patient appears restless and has increased work of breathing  HENT:  Head: Normocephalic and atraumatic.  Eyes: Pupils are equal, round, and reactive to light.  Neck: Normal range of motion. Neck supple.  Cardiovascular: Normal rate, regular rhythm and normal heart sounds.   Pulmonary/Chest: Effort normal and breath sounds normal. No respiratory distress. He has no wheezes. He has no rales. He exhibits no tenderness.  Rhonchi bilaterally. He has tachypnea and some increased work of breathing  Abdominal: Soft. Bowel sounds are normal. There is no tenderness. There is no rebound and no guarding.  Musculoskeletal: Normal range of motion. He exhibits edema.  Bilateral nonpitting edema  Lymphadenopathy:    He has no cervical adenopathy.  Neurological: He is alert and oriented to person, place, and time.  Skin: Skin is warm and dry. No rash noted.  Psychiatric: He has a normal mood and affect.    ED Course  Procedures (including critical care time) Labs Review Labs Reviewed  CBC WITH DIFFERENTIAL - Abnormal; Notable for the following:    WBC 14.5 (*)    MCV 74.5 (*)    RDW 16.7 (*)    Neutro Abs 8.7 (*)    Monocytes Absolute 1.6 (*)    All other components within normal limits  COMPREHENSIVE METABOLIC PANEL - Abnormal; Notable for the following:    Glucose, Bld 143 (*)    Albumin 3.3 (*)    GFR calc non Af Amer 81 (*)    All other components within normal limits  PRO B NATRIURETIC PEPTIDE  I-STAT TROPOININ, ED    Imaging Review Dg Chest Port 1 View  11/27/2013   CLINICAL DATA:  Shortness of breath today, initial evaluation, personal history of congestive heart failure and hypertension as well as smoking  EXAM: PORTABLE CHEST - 1 VIEW   COMPARISON:  11/26/2013, 12/22/2012  FINDINGS: Mild stable cardiac enlargement. Mild central pulmonary vascular congestion is stable from 11/26/2013 but slightly more prominent when compared to 12/22/2012. No focal consolidation or effusion. Mild perihilar peribronchial wall thickening. Minimal interstitial changes. Tracheostomy tube unchanged.  IMPRESSION: Study somewhat limited by patient body habitus stable cardiac enlargement and vascular congestion when compared to 11/26/2013, with vascular congestion slightly more prominent when compared to 12/22/2012. There may be minimal interstitial pulmonary edema.   Electronically Signed   By: Skipper Cliche M.D.   On: 11/27/2013 10:01   Dg Chest Port 1 View  11/26/2013   CLINICAL DATA:  Shortness of breath.  EXAM: PORTABLE CHEST - 1 VIEW  COMPARISON:  December 22, 2012.  FINDINGS: Stable cardiomegaly. Tracheostomy is unchanged in position. Stable mild central pulmonary vascular congestion is noted. No pneumothorax or pleural effusion is noted. Bony thorax is intact.  IMPRESSION: Stable cardiomegaly and mild central pulmonary vascular congestion. No significant change compared to prior exam.   Electronically Signed   By: Jeneen Rinks  Green M.D.   On: 11/26/2013 09:38     EKG Interpretation   Date/Time:  Saturday November 27 2013 08:46:09 EDT Ventricular Rate:  86 PR Interval:  164 QRS Duration: 91 QT Interval:  365 QTC Calculation: 436 R Axis:   -119 Text Interpretation:  Sinus rhythm Left anterior fascicular block Low  voltage, precordial leads since last tracing no significant change  Confirmed by Alayja Armas  MD, Quentin Shorey ([id number]) on 11/27/2013 9:08:20 AM      MDM   Final diagnoses:  SOB (shortness of breath)  Bronchitis    Patient presents with productive cough and shortness of breath. He is afebrile. His stats are in the low 90s off oxygen which is being supplied via a trach collar. He has no evidence of pneumonia on chest x-ray although he does have  an elevated white blood cell count and yellow sputum. Given this I will go ahead and start him on antibiotics for possible bronchitis. He still has some increased work of breathing after nebulizer treatment and while being on oxygen. Given this I feel that he warrants admission for observation. I will consult the hospitalist for admission.  Spoke with Dr. Rockne Menghini who will admit pt.  Malvin Johns, MD 11/27/13 1341

## 2013-11-27 NOTE — Progress Notes (Signed)
Rt was called to ED for a trach that was here 11/26/13 seen in the ED. Rt setup trach collar, RN gave neb and suction pt. Pt complaining with sob. Pt is in no distress at this time. Pt has #6 Shiley xlt. Rt will continue to monitor as needed. Pt does have lots of congestion and coughing.

## 2013-11-27 NOTE — Progress Notes (Signed)
Trach CK done. Pt was suction thick white sputum. Vitals stable, pt in no distress at this time.

## 2013-11-27 NOTE — ED Notes (Signed)
RT at bedside.

## 2013-11-27 NOTE — ED Notes (Signed)
Pt reports generalized chest pain, SOB, increased mucus from tracheostomy. Pt was seen yesterday for same, reports worsening symptoms today.

## 2013-11-28 DIAGNOSIS — Z430 Encounter for attention to tracheostomy: Secondary | ICD-10-CM | POA: Diagnosis not present

## 2013-11-28 DIAGNOSIS — L89150 Pressure ulcer of sacral region, unstageable: Secondary | ICD-10-CM | POA: Diagnosis not present

## 2013-11-28 DIAGNOSIS — G4733 Obstructive sleep apnea (adult) (pediatric): Secondary | ICD-10-CM | POA: Diagnosis present

## 2013-11-28 DIAGNOSIS — F17210 Nicotine dependence, cigarettes, uncomplicated: Secondary | ICD-10-CM | POA: Diagnosis present

## 2013-11-28 DIAGNOSIS — E1165 Type 2 diabetes mellitus with hyperglycemia: Secondary | ICD-10-CM | POA: Diagnosis present

## 2013-11-28 DIAGNOSIS — I10 Essential (primary) hypertension: Secondary | ICD-10-CM | POA: Diagnosis present

## 2013-11-28 DIAGNOSIS — Z23 Encounter for immunization: Secondary | ICD-10-CM | POA: Diagnosis not present

## 2013-11-28 DIAGNOSIS — R739 Hyperglycemia, unspecified: Secondary | ICD-10-CM | POA: Diagnosis not present

## 2013-11-28 DIAGNOSIS — E118 Type 2 diabetes mellitus with unspecified complications: Secondary | ICD-10-CM | POA: Diagnosis not present

## 2013-11-28 DIAGNOSIS — J449 Chronic obstructive pulmonary disease, unspecified: Secondary | ICD-10-CM | POA: Diagnosis present

## 2013-11-28 DIAGNOSIS — K219 Gastro-esophageal reflux disease without esophagitis: Secondary | ICD-10-CM | POA: Diagnosis not present

## 2013-11-28 DIAGNOSIS — L89159 Pressure ulcer of sacral region, unspecified stage: Secondary | ICD-10-CM | POA: Diagnosis not present

## 2013-11-28 DIAGNOSIS — Z79899 Other long term (current) drug therapy: Secondary | ICD-10-CM | POA: Diagnosis not present

## 2013-11-28 DIAGNOSIS — J40 Bronchitis, not specified as acute or chronic: Secondary | ICD-10-CM | POA: Diagnosis not present

## 2013-11-28 DIAGNOSIS — Z9911 Dependence on respirator [ventilator] status: Secondary | ICD-10-CM | POA: Diagnosis not present

## 2013-11-28 DIAGNOSIS — I509 Heart failure, unspecified: Secondary | ICD-10-CM | POA: Diagnosis present

## 2013-11-28 DIAGNOSIS — M6281 Muscle weakness (generalized): Secondary | ICD-10-CM | POA: Diagnosis not present

## 2013-11-28 DIAGNOSIS — Z6841 Body Mass Index (BMI) 40.0 and over, adult: Secondary | ICD-10-CM | POA: Diagnosis not present

## 2013-11-28 DIAGNOSIS — Z794 Long term (current) use of insulin: Secondary | ICD-10-CM | POA: Diagnosis not present

## 2013-11-28 DIAGNOSIS — E669 Obesity, unspecified: Secondary | ICD-10-CM | POA: Diagnosis not present

## 2013-11-28 DIAGNOSIS — J9620 Acute and chronic respiratory failure, unspecified whether with hypoxia or hypercapnia: Secondary | ICD-10-CM | POA: Diagnosis present

## 2013-11-28 DIAGNOSIS — J209 Acute bronchitis, unspecified: Secondary | ICD-10-CM | POA: Diagnosis present

## 2013-11-28 DIAGNOSIS — I444 Left anterior fascicular block: Secondary | ICD-10-CM | POA: Diagnosis present

## 2013-11-28 DIAGNOSIS — J9611 Chronic respiratory failure with hypoxia: Secondary | ICD-10-CM | POA: Diagnosis not present

## 2013-11-28 DIAGNOSIS — R0602 Shortness of breath: Secondary | ICD-10-CM | POA: Diagnosis not present

## 2013-11-28 DIAGNOSIS — Z930 Tracheostomy status: Secondary | ICD-10-CM | POA: Diagnosis not present

## 2013-11-28 DIAGNOSIS — J9600 Acute respiratory failure, unspecified whether with hypoxia or hypercapnia: Secondary | ICD-10-CM | POA: Diagnosis not present

## 2013-11-28 DIAGNOSIS — Z609 Problem related to social environment, unspecified: Secondary | ICD-10-CM

## 2013-11-28 DIAGNOSIS — J9621 Acute and chronic respiratory failure with hypoxia: Secondary | ICD-10-CM | POA: Diagnosis not present

## 2013-11-28 DIAGNOSIS — E119 Type 2 diabetes mellitus without complications: Secondary | ICD-10-CM

## 2013-11-28 DIAGNOSIS — E662 Morbid (severe) obesity with alveolar hypoventilation: Secondary | ICD-10-CM | POA: Diagnosis not present

## 2013-11-28 DIAGNOSIS — F4320 Adjustment disorder, unspecified: Secondary | ICD-10-CM | POA: Diagnosis present

## 2013-11-28 LAB — HEMOGLOBIN A1C
Hgb A1c MFr Bld: 6.8 % — ABNORMAL HIGH (ref <5.7)
Mean Plasma Glucose: 148 mg/dL — ABNORMAL HIGH (ref <117)

## 2013-11-28 MED ORDER — METHYLPREDNISOLONE SODIUM SUCC 40 MG IJ SOLR
40.0000 mg | Freq: Two times a day (BID) | INTRAMUSCULAR | Status: DC
  Administered 2013-11-28 – 2013-11-30 (×6): 40 mg via INTRAVENOUS
  Filled 2013-11-28 (×7): qty 1

## 2013-11-28 MED ORDER — GUAIFENESIN-DM 100-10 MG/5ML PO SYRP: 5.0000 mL | ORAL_SOLUTION | ORAL | Status: DC | PRN

## 2013-11-28 NOTE — Progress Notes (Signed)
Progress Note   [name]:[id number] DOB: 02-01-1966 DOA: December 16, 2013 PCP: Philis Fendt, MD   Brief Narrative:   [name] is an 48 y.o. male with a PMH of chronic respiratory failure s/p trach, OSA/OHS, COPD, CHF/EF 60-65% on last Echo 01/09/10 who was admitted 12/16/13 with a 3 day h/o worsening dypnea, change in sputum (thicker, yellower) and pleuritic type chest pain. CXR clear, afebrile on presentation, but lungs with rhonchi/bronchospasm.  Additionally, the patient is in an emotional crisis secondary to multiple losses and poor social circumstance with limited resources/support (see my H&P for details).  Assessment/Plan:   Principal Problem:  Acute on chronic respiratory failure with acute bronchitis  Admitted for observation. Change to inpatient. Continue oxygen via trach.  Continue nebulized bronchodilator therapy 4 times a day and every 2 hours as needed.  Chest x-ray clear, but with change in sputum, would treat with a Z-Pak.  Start Solumedrol 40 mg IV Q 12 due to ongoing bronchospasm. Lack of BNP elevation rules out CHF as an underlying etiology of his respiratory failure.  Active Problems:  Severe obesity / Hyperglycemia  Dietician consult.  Note: patient has little resources and often eats Ramen noodles because he cannot afford other sources of nutrition.  Check hemoglobin A1c.  High risk for DM.  Fasting blood glucoses elevated.  Poor social situation  SW and CM consultations requested.  Obesity hypoventilation syndrome  Tracheostomy dependent.  Decubitus ulcer of sacral area  Wound care RN to evaluate.  Left anterior fascicular block  Likely benign, but places the patient at risk for developing atrial fibrillation.  DVT prophylaxis  Lovenox ordered.  Code Status: Full.  Family Communication: No family at bedside.  Disposition Plan: Home when stable.   IV Access:    Peripheral IV   Procedures and diagnostic studies:   Dg Chest  Port 1 View 12-16-2013: Study somewhat limited by patient body habitus stable cardiac enlargement and vascular congestion when compared to 11/26/2013, with vascular congestion slightly more prominent when compared to 12/22/2012. There may be minimal interstitial pulmonary edema.    Dg Chest Port 1 View 11/26/2013: Stable cardiomegaly and mild central pulmonary vascular congestion. No significant change compared to prior exam.     Medical Consultants:    None.  Anti-Infectives:    Azithromycin 12/16/2013--->  Subjective:   [name] is tearful, expressing gratitude for the kindness some of the nursing staff has provided for him.  He remains in crisis and overwhelmed at the possibility of becoming homeless because his apartment has been condemned.  Still dyspneic with thick secretions from trach, and still having paroxysms of cough.  Objective:    Filed Vitals:   11/28/13 0326 11/28/13 0545 11/28/13 0748 11/28/13 0754  BP:  140/83    Pulse: 94 76  98  Temp:  97.8 F (36.6 C)    TempSrc:  Oral    Resp: 18 18  18   Height:      Weight:  182.074 kg (401 lb 6.4 oz)    SpO2: 100% 100% 100% 100%    Intake/Output Summary (Last 24 hours) at 11/28/13 0803 Last data filed at 11/28/13 0558  Gross per 24 hour  Intake    600 ml  Output   1275 ml  Net   -675 ml    Exam: Gen:  Tearful, anxious Cardiovascular:  RRR, No M/R/G Respiratory:  Lungs with expiratory wheezes Gastrointestinal:  Abdomen soft, NT/ND, + BS Extremities:  + edema  Data Reviewed:    Labs: Basic Metabolic Panel:  Recent Labs Lab 11/26/13 0913 11/27/13 0904  NA 135* 139  K 4.9 4.2  CL 96 101  CO2 28 27  GLUCOSE 130* 143*  BUN 11 14  CREATININE 0.85 1.06  CALCIUM 8.5 8.9   GFR Estimated Creatinine Clearance: 135.6 ml/min (by C-G formula based on Cr of 1.06). Liver Function Tests:  Recent Labs Lab 11/27/13 0904  AST 17  ALT 20  ALKPHOS 78  BILITOT 0.5  PROT 7.5  ALBUMIN 3.3*    CBC:  Recent Labs Lab 11/26/13 0913 11/27/13 0904  WBC 15.3* 14.5*  NEUTROABS  --  8.7*  HGB 13.8 14.3  HCT 39.7 40.7  MCV 73.4* 74.5*  PLT 303 307   BNP (last 3 results)  Recent Labs  11/26/13 0913 11/27/13 0904  PROBNP 37.9 41.6   Microbiology Recent Results (from the past 240 hour(s))  MRSA PCR SCREENING     Status: None   Collection Time    11/27/13  5:58 PM      Result Value Ref Range Status   MRSA by PCR NEGATIVE  NEGATIVE Final   Comment:            The GeneXpert MRSA Assay (FDA     approved for NASAL specimens     only), is one component of a     comprehensive MRSA colonization     surveillance program. It is not     intended to diagnose MRSA     infection nor to guide or     monitor treatment for     MRSA infections.     Medications:   . antiseptic oral rinse  7 mL Mouth Rinse q12n4p  . azithromycin  250 mg Oral Daily  . benzonatate  100 mg Oral Q8H  . chlorhexidine  15 mL Mouth Rinse BID  . enoxaparin (LOVENOX) injection  0.5 mg/kg Subcutaneous Q24H  . guaiFENesin  600 mg Oral BID  . ipratropium-albuterol  3 mL Nebulization QID  . sodium chloride  3 mL Intravenous Q12H   Continuous Infusions:   Time spent: 25 minutes.   LOS: 1 day   Leveon Pelzer  [facility] Pager [phone]. If unable to reach me by pager, please call my cell phone at [phone].  *Please refer to [email or web address], password [email or web address] to get updated schedule on who will round on this patient, as hospitalists switch teams weekly. If 7PM-7AM, please contact night-coverage at [email or web address], password [email or web address] for any overnight needs.  11/28/2013, 8:03 AM

## 2013-11-28 NOTE — Progress Notes (Signed)
UR completed 

## 2013-11-29 DIAGNOSIS — E1165 Type 2 diabetes mellitus with hyperglycemia: Secondary | ICD-10-CM

## 2013-11-29 MED ORDER — INFLUENZA VAC SPLIT QUAD 0.5 ML IM SUSY
0.5000 mL | PREFILLED_SYRINGE | INTRAMUSCULAR | Status: AC
  Administered 2013-11-30: 0.5 mL via INTRAMUSCULAR
  Filled 2013-11-29 (×2): qty 0.5

## 2013-11-29 MED ORDER — INSULIN ASPART 100 UNIT/ML ~~LOC~~ SOLN
0.0000 [IU] | Freq: Three times a day (TID) | SUBCUTANEOUS | Status: DC
  Administered 2013-11-30 (×3): 3 [IU] via SUBCUTANEOUS
  Administered 2013-12-01 – 2013-12-02 (×2): 2 [IU] via SUBCUTANEOUS

## 2013-11-29 MED ORDER — LIVING WELL WITH DIABETES BOOK
Freq: Once | Status: AC
  Administered 2013-11-29: 17:00:00
  Filled 2013-11-29: qty 1

## 2013-11-29 MED ORDER — INSULIN ASPART 100 UNIT/ML ~~LOC~~ SOLN
0.0000 [IU] | Freq: Every day | SUBCUTANEOUS | Status: DC
  Administered 2013-11-29: 2 [IU] via SUBCUTANEOUS

## 2013-11-29 MED ORDER — ADULT MULTIVITAMIN LIQUID CH
5.0000 mL | Freq: Every day | ORAL | Status: DC
  Administered 2013-11-29 – 2013-12-02 (×4): 5 mL via ORAL
  Filled 2013-11-29 (×4): qty 5

## 2013-11-29 MED ORDER — METFORMIN HCL ER 750 MG PO TB24
750.0000 mg | ORAL_TABLET | Freq: Two times a day (BID) | ORAL | Status: DC
  Administered 2013-11-29 – 2013-12-02 (×6): 750 mg via ORAL
  Filled 2013-11-29 (×7): qty 1

## 2013-11-29 MED ORDER — JUVEN PO PACK
1.0000 | PACK | Freq: Two times a day (BID) | ORAL | Status: DC
  Administered 2013-11-29 – 2013-12-02 (×5): 1 via ORAL
  Filled 2013-11-29 (×7): qty 1

## 2013-11-29 NOTE — Progress Notes (Signed)
INITIAL NUTRITION ASSESSMENT  DOCUMENTATION CODES Per approved criteria  -Morbid Obesity   INTERVENTION: -Recommend Juven supplement for assistance in skin integrity and wound healing -Recommend multivitamin -Provided pt with nutrition education regarding affordable, healthy nutrition; RD to follow for additional reinforcement -Recommend CM and SW consults for assistance with available resources; Pt has < $50/month for groceries and being evicted in early November  NUTRITION DIAGNOSIS: Inadequate energy intake related to financial contraints as evidenced by diet recall   Goal: Pt to meet >/= 90% of their estimated nutrition needs    Monitor:  Diet education needs, d/c planning, total protein intake, skin integrity, labs, weights  Reason for Assessment: Consult to Assess  48 y.o. male  Admitting Dx: Acute on chronic respiratory failure  ASSESSMENT: [name] is an 48 y.o. male with a PMH of chronic respiratory failure s/p trach, OSA/OHS, COPD, CHF/EF 60-65% on last Echo 01/09/10 who presented to the ER with a 3 day h/o worsening dypnea, change in sputum (thicker, yellower) and pleuritic type chest pain  -Pt reported significant financial constraints that inhibit adequate nutrition. Diet recall indicates pt consuming mostly Ramen noodles. Will occasionally consume canned black beans or canned fruit/vegetables. Encouraged pt to try dry lentils, beans, brown rice, or peanut butter and jelly for affordable sandwiches.  -Pt will sometimes visit local churches or food banks; however d/t pain in sacral region, difficulty with transportation and long lines, pt does not regularly visit them -Pt noted he is not able to be candidate for Food Stamps d/t issues with criminal charges over 20 years ago -Provided pt with nutrition education handouts regarding healthy nutrition on a budget, and recommend CM and SW assistance -MD noted pt with sacral decub ulcer, WOC pending. Recommend Juven for  additional nutrients for skin integrity/wound healing. -Pt with excellent appetite, consuming 100% of meals  Height: Ht Readings from Last 1 Encounters:  11/27/13 5\' 7"  (1.702 m)    Weight: Wt Readings from Last 1 Encounters:  11/28/13 401 lb 6.4 oz (182.074 kg)    Ideal Body Weight: 148 lb  % Ideal Body Weight: 271%  Wt Readings from Last 10 Encounters:  11/28/13 401 lb 6.4 oz (182.074 kg)  12/25/10 396 lb 13.3 oz (180 kg)  12/25/10 396 lb 13.3 oz (180 kg)  12/25/10 396 lb 13.3 oz (180 kg)    Usual Body Weight: 400 lb   % Usual Body Weight: 100%  BMI:  Body mass index is 62.85 kg/(m^2). Morbid Obesity  Estimated Nutritional Needs: Kcal: 2100-2300 Protein: >/= 135 gram Fluid: >/= 2000 ml daily  Skin: sacral decub ulcer- WOC pending; RN noted "MD able to put fist in wound"  Diet Order: General  EDUCATION NEEDS: -Education needs addressed   Intake/Output Summary (Last 24 hours) at 11/29/13 1253 Last data filed at 11/29/13 1100  Gross per 24 hour  Intake   1320 ml  Output   3800 ml  Net  -2480 ml    Last BM: 10/25   Labs:   Recent Labs Lab 11/26/13 0913 11/27/13 0904  NA 135* 139  K 4.9 4.2  CL 96 101  CO2 28 27  BUN 11 14  CREATININE 0.85 1.06  CALCIUM 8.5 8.9  GLUCOSE 130* 143*    CBG (last 3)  No results found for this basename: GLUCAP,  in the last 72 hours  Scheduled Meds: . antiseptic oral rinse  7 mL Mouth Rinse q12n4p  . azithromycin  250 mg Oral Daily  . chlorhexidine  15 mL Mouth Rinse BID  . enoxaparin (LOVENOX) injection  0.5 mg/kg Subcutaneous Q24H  . guaiFENesin  600 mg Oral BID  . [START ON 11/30/2013] Influenza vac split quadrivalent PF  0.5 mL Intramuscular Tomorrow-1000  . ipratropium-albuterol  3 mL Nebulization QID  . methylPREDNISolone (SOLU-MEDROL) injection  40 mg Intravenous Q12H  . sodium chloride  3 mL Intravenous Q12H    Continuous Infusions:   Past Medical History  Diagnosis Date  . Hypertension   . CHF  (congestive heart failure)   . Ventilator dependent   . Sleep apnea     Past Surgical History  Procedure Laterality Date  . Incision and drainage of wound  12/22/2010    Procedure: IRRIGATION AND DEBRIDEMENT WOUND;  Surgeon: Harl Bowie, MD;  Location: [facility];  Service: General;  Laterality: N/A;  patient on vent  . Wound debridement  12/21/2010    Procedure: DEBRIDEMENT WOUND;  Surgeon: Harl Bowie, MD;  Location: [facility];  Service: General;  Laterality: N/A;  Sacral  . Tracheostomy tube placement      Atlee Abide MS RD LDN Clinical Dietitian [phone]

## 2013-11-29 NOTE — Progress Notes (Signed)
Clinical Social Work Department BRIEF PSYCHOSOCIAL ASSESSMENT 11/29/2013  Patient:  [name],[name]     Account Number:  [id number]     Admit date:  11/27/2013  Clinical Social Worker:  ,, LCSW  Date/Time:  11/29/2013 04:00 PM  Referred by:  Physician  Date Referred:  11/29/2013 Referred for  SNF Placement   Other Referral:   Interview type:  Patient Other interview type:    PSYCHOSOCIAL DATA Living Status:  ALONE Admitted from facility:   Level of care:   Primary support name:  Ann Primary support relationship to patient:  FRIEND Degree of support available:   Adequate    CURRENT CONCERNS Current Concerns  Post-Acute Placement   Other Concerns:    SOCIAL WORK ASSESSMENT / PLAN CSW received referral due to a complicating home environment prior to admission. CSW reviewed chart and met with patient at bedside. CSW introduced myself and explained role.    Patient was living in an apartment prior to admission. Patient reports that his apartment was recently condemned and he has to move out by November 4th. Patient reports he has been working on finding new housing and a friend (Ann) has been assisting as well. Patient reports that he has been to SNF in the past and although it is not his first option, he knows he cannot DC home in the condition that he is in now.    CSW provided SNF list and explained process to patient and friend. Patient reports he has been [place] in [place] in [place] and it was not a good experience. Patient reports he is agreeable to [place] search. CSW completed FL2 and faxed out and will follow up with bed offers.   Assessment/plan status:  Psychosocial Support/Ongoing Assessment of Needs Other assessment/ plan:   Information/referral to community resources:   SNF list    PATIENT'S/FAMILY'S RESPONSE TO PLAN OF CARE: Patient alert and oriented. Patient aware of poor housing arrangements and knows that he needs assistance. Patient reports he  receives about $600 a month in social security and is not eligible for food stamps. Patient reports he has been unable to fully care for his wounds and knows that at least ST SNF is needed. Patient tearful and reports he knows this is his best option but just wants to make sure he is going to be taken care of. Patient spoke about traumatic weeks and losing sister and feeling depressed. Patient thanked CSW for assistance and agreeable to continued follow up.        , LCSW (Coverage for [facility]) 

## 2013-11-29 NOTE — Progress Notes (Signed)
Inpatient Diabetes Program Recommendations  AACE/ADA: New Consensus Statement on Inpatient Glycemic Control (2013)  Target Ranges:  Prepandial:   less than 140 mg/dL      Peak postprandial:   less than 180 mg/dL (1-2 hours)      Critically ill patients:  140 - 180 mg/dL   Reason for Visit: Diabetes Consult Diabetes history: Newly-diagnosed DM (Hgba1C - 6.8%) Outpatient Diabetes medications: None Current orders for Inpatient glycemic control: metformin 750 mg bid On Solumedrol 40 mg Q12H.  Pt states he's had symptoms of polyuria and polydipsia for the past 3 months. Diet is poor on nutrients and variety. See RD note Briefly discussed diagnosis of DM with pt. Tearful and wanting to control blood sugars.  Results for [name], [name] (MRN [id number]) as of 11/29/2013 17:20  Ref. Range 11/28/2013 12:03  Hemoglobin A1C Latest Range: <5.7 % 6.8 (H)  Results for [name], [name] (MRN [id number]) as of 11/29/2013 17:20  Ref. Range 11/26/2013 09:13 11/27/2013 09:04  Glucose Latest Range: 70-99 mg/dL 130 (H) 143 (H)    Metformin started today. Needs correction insulin. Steroids likely causing some hyperglycemia.  Recommendations Consider addition of Novolog moderate tidwc and hs. Ordered Living Well With Diabetes book and pt to begin watching diabetes videos on pt ed channel.  Will f/u in am for further questions and diabetes teaching. Thank you. Lorenda Peck, RD, LDN, CDE Inpatient Diabetes Coordinator [phone]

## 2013-11-29 NOTE — Progress Notes (Signed)
Progress Note   [name] SNK:[id number] DOB: [birth year] DOA: 11/27/2013 PCP: Philis Fendt, MD   Brief Narrative:   [name] is an 48 y.o. male with a PMH of chronic respiratory failure s/p trach, OSA/OHS, COPD, CHF/EF 60-65% on last Echo 01/09/10 who was admitted 11/27/13 with a 3 day h/o worsening dypnea, change in sputum (thicker, yellower) and pleuritic type chest pain. CXR clear, afebrile on presentation, but lungs with rhonchi/bronchospasm.  Additionally, the patient is in an emotional crisis secondary to multiple losses and poor social circumstance with limited resources/support (see my H&P for details).  Assessment/Plan:   Principal Problem:  Acute on chronic respiratory failure with acute bronchitis  Admitted for observation. Change to inpatient. Continue oxygen via trach.  Continue nebulized bronchodilator therapy 4 times a day and every 2 hours as needed.  Chest x-ray clear, but with change in sputum, would treat with a Z-Pak.  Continue Solumedrol 40 mg IV Q 12 due to ongoing bronchospasm. Lack of BNP elevation rules out CHF as an underlying etiology of his respiratory failure.  Active Problems:  Severe obesity / Type II Diabetes Mellitus  Dietician consult.  Note: patient has little resources and often eats Ramen noodles because he cannot afford other sources of nutrition.  Hemoglobin A1c 6.8%. Consistent with new diagnosis of DM.  DM coordinator to see.  Start Metformin.  Poor social situation  SW and CM consultations requested.  Obesity hypoventilation syndrome  Tracheostomy dependent.  Decubitus ulcer of sacral area  Seen by wound care RN, skin care per recommendations.  Left anterior fascicular block  Likely benign, but places the patient at risk for developing atrial fibrillation.  DVT prophylaxis  Lovenox ordered.  Code Status: Full.  Family Communication: No family at bedside.  Disposition Plan: Home vs SNF when stable.   IV  Access:    Peripheral IV   Procedures and diagnostic studies:   Dg Chest Port 1 View 11/27/2013: Study somewhat limited by patient body habitus stable cardiac enlargement and vascular congestion when compared to 11/26/2013, with vascular congestion slightly more prominent when compared to 12/22/2012. There may be minimal interstitial pulmonary edema.    Dg Chest Port 1 View 11/26/2013: Stable cardiomegaly and mild central pulmonary vascular congestion. No significant change compared to prior exam.     Medical Consultants:    None.  Anti-Infectives:    Azithromycin 11/27/13--->  Subjective:   [name] is still having paroxysms of cough with wheeze.  No chest pain.  No new complaints.  Objective:    Filed Vitals:   11/28/13 2025 11/28/13 2346 11/29/13 0350 11/29/13 0640  BP:    140/90  Pulse: 94 95 90 90  Temp:    97.8 F (36.6 C)  TempSrc:    Oral  Resp: 18 16 16 20   Height:      Weight:      SpO2: 93% 92% 92% 91%    Intake/Output Summary (Last 24 hours) at 11/29/13 0806 Last data filed at 11/29/13 0700  Gross per 24 hour  Intake   1560 ml  Output   3300 ml  Net  -1740 ml    Exam: Gen:  Anxious Cardiovascular:  RRR, No M/R/G Respiratory:  Lungs with expiratory wheezes Gastrointestinal:  Abdomen soft, NT/ND, + BS Extremities:  + edema   Data Reviewed:    Labs: Basic Metabolic Panel:  Recent Labs Lab 11/26/13 0913 11/27/13 0904  NA 135* 139  K 4.9 4.2  CL 96 101  CO2 28 27  GLUCOSE 130* 143*  BUN 11 14  CREATININE 0.85 1.06  CALCIUM 8.5 8.9   GFR Estimated Creatinine Clearance: 135.6 ml/min (by C-G formula based on Cr of 1.06). Liver Function Tests:  Recent Labs Lab 11/27/13 0904  AST 17  ALT 20  ALKPHOS 78  BILITOT 0.5  PROT 7.5  ALBUMIN 3.3*   CBC:  Recent Labs Lab 11/26/13 0913 11/27/13 0904  WBC 15.3* 14.5*  NEUTROABS  --  8.7*  HGB 13.8 14.3  HCT 39.7 40.7  MCV 73.4* 74.5*  PLT 303 307   BNP (last 3  results)  Recent Labs  11/26/13 0913 11/27/13 0904  PROBNP 37.9 41.6   Microbiology Recent Results (from the past 240 hour(s))  MRSA PCR SCREENING     Status: None   Collection Time    11/27/13  5:58 PM      Result Value Ref Range Status   MRSA by PCR NEGATIVE  NEGATIVE Final   Comment:            The GeneXpert MRSA Assay (FDA     approved for NASAL specimens     only), is one component of a     comprehensive MRSA colonization     surveillance program. It is not     intended to diagnose MRSA     infection nor to guide or     monitor treatment for     MRSA infections.     Medications:   . antiseptic oral rinse  7 mL Mouth Rinse q12n4p  . azithromycin  250 mg Oral Daily  . chlorhexidine  15 mL Mouth Rinse BID  . enoxaparin (LOVENOX) injection  0.5 mg/kg Subcutaneous Q24H  . guaiFENesin  600 mg Oral BID  . [START ON 11/30/2013] Influenza vac split quadrivalent PF  0.5 mL Intramuscular Tomorrow-1000  . ipratropium-albuterol  3 mL Nebulization QID  . methylPREDNISolone (SOLU-MEDROL) injection  40 mg Intravenous Q12H  . sodium chloride  3 mL Intravenous Q12H   Continuous Infusions:   Time spent: 25 minutes.   LOS: 2 days   Suheily Birks  [facility] Pager [phone]. If unable to reach me by pager, please call my cell phone at [phone].  *Please refer to [email or web address], password [email or web address] to get updated schedule on who will round on this patient, as hospitalists switch teams weekly. If 7PM-7AM, please contact night-coverage at [email or web address], password [email or web address] for any overnight needs.  11/29/2013, 8:06 AM

## 2013-11-29 NOTE — Consult Note (Signed)
WOC wound consult note Reason for Consult:Healed PrU at coccyx, Moisture associated skin damage (MASD), specifically intertriginous dermatitis (ITD) Wound type:Pressure with Moisture Pressure Ulcer POA: Yes Measurement:ITD: 1cm x 8cm x 0.2cm Wound JJH:ERDE, moist Drainage (amount, consistency, odor)  Periwound:white/grey macerated tissue Dressing procedure/placement/frequency: I will order a air mattress to promote airflow while in bed as well as a topical product for moisture management (InterDry Ag+).  Finally, we will attempt to obtain a bariatric pressure redistribution chair cushion prior to patient's discharge. [place] nursing team will not follow, but will remain available to this patient, the nursing and medical team.  Please re-consult if needed. Thanks, Maudie Flakes, MSN, RN, [place], [place], [place] [phone])

## 2013-11-29 NOTE — Progress Notes (Addendum)
Clinical Social Work Department CLINICAL SOCIAL WORK PLACEMENT NOTE 11/29/2013  Patient:  [name], [name]  Account Number:  [id number] Admit date:  11/27/2013  Clinical Social Worker:  Sindy Messing, LCSW  Date/time:  11/29/2013 04:30 PM  Clinical Social Work is seeking post-discharge placement for this patient at the following level of care:   SKILLED NURSING   (*CSW will update this form in Epic as items are completed)   11/29/2013  Patient/family provided with [facility] Department of Clinical Social Work's list of facilities offering this level of care within the geographic area requested by the patient (or if unable, by the patient's family).  11/29/2013  Patient/family informed of their freedom to choose among providers that offer the needed level of care, that participate in Medicare, Medicaid or managed care program needed by the patient, have an available bed and are willing to accept the patient.  11/29/2013  Patient/family informed of [facility]' ownership interest in [facility], as well as of the fact that they are under no obligation to receive care at this facility.  PASARR submitted to EDS on existing # PASARR number received on   FL2 transmitted to all facilities in geographic area requested by pt/family on  11/29/2013 FL2 transmitted to all facilities within larger geographic area on   Patient informed that his/her managed care company has contracts with or will negotiate with  certain facilities, including the following:     Patient/family informed of bed offers received:  11/30/13 Patient chooses bed at [facility] Physician recommends and patient chooses bed at    Patient to be transferred to [facility]  on  12/02/13 Patient to be transferred to facility by [facility] Patient and family notified of transfer on 12/02/13 Name of family member notified:  None-patient reports he will inform friends of DC  The following physician request were  entered in Epic:   Additional Comments:

## 2013-11-29 NOTE — Care Management Note (Addendum)
    Page 1 of 1   11/30/2013     12:05:30 PM CARE MANAGEMENT NOTE 11/30/2013  Patient:  [name], [name]   Account Number:  [id number]  Date Initiated:  11/29/2013  Documentation initiated by:  Dessa Phi  Subjective/Objective Assessment:   48 Y/O M ADMITTED W/CHF.YK:ZLDJT DEPENDANT,HOME VENT, MORBIDLY OBESE.     Action/Plan:   FROM HOME.HAS DME-[facility]-02,TRACH SUPPLIES.NEB MACHINE-BACK TO BASICS.HAS PCS.   Anticipated DC Date:  12/02/2013   Anticipated DC Plan:  [place]  CM consult      Choice offered to / List presented to:             Status of service:  In process, will continue to follow Medicare Important Message given?  YES (If response is "NO", the following Medicare IM given date fields will be blank) Date Medicare IM given:  11/30/2013 Medicare IM given by:  [facility] Date Additional Medicare IM given:   Additional Medicare IM given by:    Discharge Disposition:    Per UR Regulation:  Reviewed for med. necessity/level of care/duration of stay  If discussed at Long Length of Stay Meetings, dates discussed:    Comments:  11/30/13 Nury Nebergall RN,BSN NCM [place] [facility]-PCP NOT IN NETWORK.[facility] WILL TAKE A LOOK-BUT MAY NOT BE APPROPRIATE SINCE NO READMISSIONS,OR ED VISITS-AWAIT RESPONSE.PER CSW-FAXED OUT FOR SNF.  11/29/13 Laurabelle Gorczyca RN,BSN NCM [phone] SPOKE TO PATIENT ABOUT D/C PLANS.[facility] IS CONDEMEND.HAS MEDICARE & MEDICAID.PER NSG-NOT ELIGIBLE FOR FOOD STAMPS.PATIENT STATES HE HAS PCS SERVICES,DME @ HOME.ALREADY APPLIED TO SECTION 8,PUBLIC HOUSING.NEEDS TRANSPORTATION, & HOUSING.WOULD RECOMMEND PT CONS.

## 2013-11-30 DIAGNOSIS — J9620 Acute and chronic respiratory failure, unspecified whether with hypoxia or hypercapnia: Secondary | ICD-10-CM | POA: Diagnosis not present

## 2013-11-30 LAB — BASIC METABOLIC PANEL
Anion gap: 9 (ref 5–15)
BUN: 22 mg/dL (ref 6–23)
CO2: 32 mEq/L (ref 19–32)
Calcium: 9.2 mg/dL (ref 8.4–10.5)
Chloride: 95 mEq/L — ABNORMAL LOW (ref 96–112)
Creatinine, Ser: 0.83 mg/dL (ref 0.50–1.35)
GLUCOSE: 226 mg/dL — AB (ref 70–99)
Potassium: 5.3 mEq/L (ref 3.7–5.3)
SODIUM: 136 meq/L — AB (ref 137–147)

## 2013-11-30 LAB — GLUCOSE, CAPILLARY
Glucose-Capillary: 236 mg/dL — ABNORMAL HIGH (ref 70–99)
Glucose-Capillary: 188 mg/dL — ABNORMAL HIGH (ref 70–99)
Glucose-Capillary: 152 mg/dL — ABNORMAL HIGH (ref 70–99)
Glucose-Capillary: 186 mg/dL — ABNORMAL HIGH (ref 70–99)
Glucose-Capillary: 114 mg/dL — ABNORMAL HIGH (ref 70–99)

## 2013-11-30 LAB — CBC
HEMATOCRIT: 42.1 % (ref 39.0–52.0)
Hemoglobin: 14.5 g/dL (ref 13.0–17.0)
MCH: 25.8 pg — AB (ref 26.0–34.0)
MCHC: 34.4 g/dL (ref 30.0–36.0)
MCV: 75 fL — ABNORMAL LOW (ref 78.0–100.0)
Platelets: 297 10*3/uL (ref 150–400)
RBC: 5.61 MIL/uL (ref 4.22–5.81)
RDW: 16.4 % — ABNORMAL HIGH (ref 11.5–15.5)
WBC: 20.2 10*3/uL — ABNORMAL HIGH (ref 4.0–10.5)

## 2013-11-30 LAB — CULTURE, RESPIRATORY W GRAM STAIN

## 2013-11-30 LAB — CULTURE, RESPIRATORY

## 2013-11-30 MED ORDER — PANTOPRAZOLE SODIUM 40 MG IV SOLR
40.0000 mg | INTRAVENOUS | Status: DC
  Administered 2013-11-30: 40 mg via INTRAVENOUS
  Filled 2013-11-30 (×3): qty 40

## 2013-11-30 MED ORDER — PROMETHAZINE HCL 25 MG/ML IJ SOLN
12.5000 mg | Freq: Once | INTRAMUSCULAR | Status: AC
  Administered 2013-11-30: 12.5 mg via INTRAVENOUS
  Filled 2013-11-30: qty 1

## 2013-11-30 MED ORDER — SODIUM CHLORIDE 0.9 % IV SOLN: INTRAVENOUS | Status: DC

## 2013-11-30 NOTE — Progress Notes (Signed)
1500 - Pt c/o "stomach upset" and "burning" most of the day, questioning whether caused by one of his new medicines.  Somewhat relieved by Maalox PRN.  Called and discussed with pharmacist - apparently Metformin can cause GI upset during the first few days.  PharmD recommended starting Protonix or similar.  Dr. Charlies Silvers notified of the above via text page.  Coolidge Breeze, RN 11/30/2013  1715- Pt vomited large amount.  Notified Dr. Charlies Silvers via text page.  Coolidge Breeze, RN 11/30/2013

## 2013-11-30 NOTE — Evaluation (Signed)
Physical Therapy Evaluation Patient Details Name: [name] MRN: [id number] DOB: April 22, 1965 Today's Date: 11/30/2013   History of Present Illness  48 yo male admitted with acute on chronic resp failure. Hx of trach, CHF, HTN, vent at night. Pt is from home alone.   Clinical Impression  On eval, pt was supervision-mod independent level for mobility-able to take a few steps in room with use of RW. Limited distance due to necessary respiratory equipment not available in room for use with portable O2 tank/trach collar. Will plan to return for 1 more tx session to further assess ambulation over increased distance. However, do not anticipate pt will have any follow up PT needs thereafter. Pt reports he is working with CM/SW to determine d/c plans.     Follow Up Recommendations No PT follow up (pt reports undecided d/c plan at this time due to home living conditons. )    Equipment Recommendations  None recommended by PT    Recommendations for Other Services       Precautions / Restrictions Precautions Precaution Comments: pt reports two falls since December.  Pt has had [facility] 7 days a week until last two weeks when he reports MD did not fill out the paperwork to keep his aid.  One fall happened when he did not have his aide there. Restrictions Weight Bearing Restrictions: No      Mobility  Bed Mobility Overal bed mobility:  (pt in chair on arrival.)             General bed mobility comments: Pt in chair on arrival.  Transfers Overall transfer level: Modified independent Equipment used: Rolling walker (2 wheeled)             General transfer comment: VCs for safety, hand placement howver pt has his own method of transferring sit to stand that he has used for the longterm.  Pt was safe in this technique.  Ambulation/Gait Ambulation/Gait assistance: Supervision Ambulation Distance (Feet): 3 Feet Assistive device: Rolling walker (2 wheeled)       General Gait Details:  only able to assess a few steps in room due to trach O2 setup and did not see O2 tank trach connection available for use in room. pt also was able to march in place in front of recliner while holdng onto walker. Supervision level for monitoring of lines.   Stairs            Wheelchair Mobility    Modified Rankin (Stroke Patients Only)       Balance Overall balance assessment: No apparent balance deficits (not formally assessed)                                           Pertinent Vitals/Pain Pain Assessment: No/denies pain    Home Living Family/patient expects to be discharged to:: Private residence Living Arrangements: Alone   Type of Home: [place] Access: Level entry     Home Layout: One level Home Equipment: Walker - 2 wheels;Hand held shower head Additional Comments: Pt will not have his apartment after Nov 4th. It has been condemed.    Prior Function Level of Independence: Needs assistance   Gait / Transfers Assistance Needed: Pt walks household distances with rolling walker  ADL's / Homemaking Assistance Needed: Pt does own cooking/gets some assist from [facility].  Comments: pt's aid has come for months (until MD  did not fill out paperwork?) and she assists with 80% of bathing, some dressing and some home tasks.     Hand Dominance   Dominant Hand: Right    Extremity/Trunk Assessment   Upper Extremity Assessment: Defer to OT evaluation           Lower Extremity Assessment: Generalized weakness      Cervical / Trunk Assessment: Normal  Communication   Communication: Tracheostomy  Cognition Arousal/Alertness: Awake/alert Behavior During Therapy: WFL for tasks assessed/performed Overall Cognitive Status: Within Functional Limits for tasks assessed                      General Comments    Exercises        Assessment/Plan    PT Assessment Patient needs continued PT services  PT Diagnosis Difficulty walking    PT Problem List Decreased mobility  PT Treatment Interventions Gait training;DME instruction;Functional mobility training;Therapeutic activities;Patient/family education   PT Goals (Current goals can be found in the Care Plan section) Acute Rehab PT Goals Patient Stated Goal: none stated PT Goal Formulation: With patient Time For Goal Achievement: 01/07/14 Potential to Achieve Goals: Good    Frequency Min 2X/week   Barriers to discharge        Co-evaluation   Reason for Co-Treatment: For patient/therapist safety PT goals addressed during session: Mobility/safety with mobility OT goals addressed during session: ADL's and self-care       End of Session   Activity Tolerance: Patient tolerated treatment well Patient left: in chair;with call bell/phone within reach           Time: 1052-1122 PT Time Calculation (min): 30 min   Charges:   PT Evaluation $Initial PT Evaluation Tier I: 1 Procedure     PT G Codes:          [name], MPT Pager: [phone]

## 2013-11-30 NOTE — Progress Notes (Signed)
Clinical Social Work  CSW met with patient at bedside to explain bed offers. CSW explained no firm offers but that two facilities want to come and evaluate patient. [employer] and [employer] [facility] will come to evaluate patient. Patient aware and agreeable to plans. Patient tearful when discussing SNF and reports he wants to have a good experience. CSW encouraged patient to ask questions to representatives when they come to evaluate him. CSW will continue to follow.  Sindy Messing, LCSW (Coverage for [employer])

## 2013-11-30 NOTE — Progress Notes (Signed)
Patient ID: [name], male   DOB: 12/29/1965, 48 y.o.   MRN: [id number] [facility] PROGRESS NOTE  [name] [id number]:[id number] DOB: 15-Mar-1965 DOA: 11/27/2013 PCP: Ricke Hey, MD  Brief narrative: 48 y.o. male with a PMH of chronic respiratory failure s/p trach, OSA/OHS, COPD, CHF/EF 60-65% on last Echo 01/09/10 who was admitted 11/27/13 with a 3 day h/o worsening dypnea, change in sputum (thicker, yellower) and pleuritic type chest pain. CXR clear, afebrile on presentation, but lungs with rhonchi/bronchospasm. Additionally, the patient is in an emotional crisis secondary to multiple losses and poor social circumstance with limited resources/support.  Assessment/Plan:   Principal Problem:  Acute on chronic respiratory failure with acute bronchitis   Admitted for observation. Change to inpatient.   Continue oxygen via trach.   Respiratory status stable. Continue nebulizer treatments QID scheduled and every 2 hours PRN wheezing or shortness of breath.  Chest x-ray clear, but treated with Z-pak considering sputum production..   Continue Solumedrol 40 mg IV Q 12 due to ongoing bronchospasm. Added protonix 40 mg IV daily for GI prophylaxis. Active Problems:  Severe obesity / Type II Diabetes Mellitus   Dietician consult. Note: patient has little resources and often eats Ramen noodles because he cannot afford other sources of nutrition.   Hemoglobin A1c 6.8%. Consistent with new diagnosis of DM. Appreciate diabetic coordinator consult and recommendations.  Poor social situation   SW and CM consultations requested. Obesity hypoventilation syndrome   Tracheostomy dependent. Decubitus ulcer of sacral area   Seen by wound care RN, continue skin care per recommendations. Left anterior fascicular block   Likely benign, but places the patient at risk for developing atrial fibrillation. DVT prophylaxis   Lovenox ordered.  Code Status: Full.  Family Communication: No  family at bedside.  Disposition Plan: SW and CM assisting discharge plan.    IV Access:   Peripheral IV Procedures and diagnostic studies:   Dg Chest Port 1 View 11/27/2013: Study somewhat limited by patient body habitus stable cardiac enlargement and vascular congestion when compared to 11/26/2013, with vascular congestion slightly more prominent when compared to 12/22/2012. There may be minimal interstitial pulmonary edema.  Dg Chest Port 1 View 11/26/2013: Stable cardiomegaly and mild central pulmonary vascular congestion. No significant change compared to prior exam.  Medical Consultants:   None. Anti-Infectives:   Azithromycin 11/27/13--->  Leisa Lenz, MD  [facility] Pager [phone]  If 7PM-7AM, please contact night-coverage [email or web address] Password [email or web address] 11/30/2013, 5:25 PM   LOS: 3 days    HPI/Subjective: No acute overnight events.  Objective: Filed Vitals:   11/30/13 1240 11/30/13 1244 11/30/13 1439 11/30/13 1634  BP:   158/86   Pulse:  90 78 81  Temp:   98 F (36.7 C)   TempSrc:   Oral   Resp:  20 20 20   Height:      Weight:      SpO2: 95% 95% 97% 95%    Intake/Output Summary (Last 24 hours) at 11/30/13 1725 Last data filed at 11/30/13 1658  Gross per 24 hour  Intake   1443 ml  Output   1200 ml  Net    243 ml    Exam:   General:  Pt is not in acute distress  Cardiovascular: Regular rate and rhythm, S1/S2 appreciated   Respiratory: diminished, no wheezing  Abdomen: Soft, non tender, non distended, bowel sounds present  Extremities: Pulses DP and PT palpable bilaterally  Neuro: Grossly nonfocal  Data Reviewed: Basic Metabolic Panel:  Recent Labs Lab 11/26/13 0913 11/27/13 0904 11/30/13 0348  NA 135* 139 136*  K 4.9 4.2 5.3  CL 96 101 95*  CO2 28 27 32  GLUCOSE 130* 143* 226*  BUN 11 14 22   CREATININE 0.85 1.06 0.83  CALCIUM 8.5 8.9 9.2   Liver Function Tests:  Recent Labs Lab 11/27/13 0904  AST 17  ALT 20  ALKPHOS  78  BILITOT 0.5  PROT 7.5  ALBUMIN 3.3*   No results found for this basename: LIPASE, AMYLASE,  in the last 168 hours No results found for this basename: AMMONIA,  in the last 168 hours CBC:  Recent Labs Lab 11/26/13 0913 11/27/13 0904 11/30/13 0348  WBC 15.3* 14.5* 20.2*  NEUTROABS  --  8.7*  --   HGB 13.8 14.3 14.5  HCT 39.7 40.7 42.1  MCV 73.4* 74.5* 75.0*  PLT 303 307 297   Cardiac Enzymes: No results found for this basename: CKTOTAL, CKMB, CKMBINDEX, TROPONINI,  in the last 168 hours BNP: No components found with this basename: POCBNP,  CBG:  Recent Labs Lab 11/29/13 2103 11/30/13 0753 11/30/13 1118 11/30/13 1654  GLUCAP 236* 188* 152* 186*    Recent Results (from the past 240 hour(s))  MRSA PCR SCREENING     Status: None   Collection Time    11/27/13  5:58 PM      Result Value Ref Range Status   MRSA by PCR NEGATIVE  NEGATIVE Final  CULTURE, RESPIRATORY (NON-EXPECTORATED)     Status: None   Collection Time    11/28/13  1:15 PM      Result Value Ref Range Status   Specimen Description TRACHEAL ASPIRATE   Final   Value: Non-Pathogenic Oropharyngeal-type Flora Isolated.     Performed at [facility]   Report Status 11/30/2013 FINAL   Final     Scheduled Meds: . azithromycin  250 mg Oral Daily  . enoxaparin (LOVENOX) injection  0.5 mg/kg Subcutaneous Q24H  . guaiFENesin  600 mg Oral BID  . insulin aspart  0-15 Units Subcutaneous TID WC  . insulin aspart  0-5 Units Subcutaneous QHS  . ipratropium-albuterol  3 mL Nebulization QID  . metFORMIN  750 mg Oral BID WC  . methylPREDNISolone   40 mg Intravenous Q12H  . multivitamin  5 mL Oral Daily  . nutrition supplement (JUVEN)  1 packet Oral BID BM  . pantoprazole (PROTONIX) IV  40 mg Intravenous Q24H

## 2013-11-30 NOTE — Evaluation (Signed)
Occupational Therapy Evaluation and Discharge Summary Patient Details Name: [name] MRN: [id number] DOB: 11-11-1965 Today's Date: 11/30/2013    History of Present Illness Pt is a 48 yo male admitted with chronic respiratory failure with trach.  Pt symptoms on admission were dyspenea and chest pain.     Clinical Impression   Pt admitted with the above diagnosis and appears to be at or close to baseline from ADL standpoint.  Pt has had a lot of OT in the past, has all necessary adaptive equipment and has relied on HHA for some time now.  Unsure of this pt's d/c plan since he does not have an apartment to return to.    Follow Up Recommendations  No OT follow up;Supervision - Intermittent    Equipment Recommendations  None recommended by OT    Recommendations for Other Services       Precautions / Restrictions Precautions Precaution Comments: pt reports two falls since December.  Pt has had HHAID 7 days a week until last two weeks when he reports MD did not fill out the paperwork to keep his aid.  One fall happened when he did not have his aid there. Restrictions Weight Bearing Restrictions: No      Mobility Bed Mobility Overal bed mobility:  (pt in chair on arrival.)             General bed mobility comments: Pt in chair on arrival.  Transfers Overall transfer level: Modified independent Equipment used: Rolling walker (2 wheeled)             General transfer comment: Pt has his own method of transferring sit to stand that he has used for the longterm.  Pt was safe in this technique.    Balance Overall balance assessment: No apparent balance deficits (not formally assessed)                                          ADL Overall ADL's : Needs assistance/impaired Eating/Feeding: Independent;Sitting   Grooming: Oral care;Wash/dry face;Wash/dry hands;Sitting;Set up   Upper Body Bathing: Set up;Sitting   Lower Body Bathing: Maximal  assistance;Sit to/from stand Lower Body Bathing Details (indicate cue type and reason): aid always washes backside. Upper Body Dressing : Minimal assistance;Sitting Upper Body Dressing Details (indicate cue type and reason): min assist to get shirt over head at times. Lower Body Dressing: Maximal assistance;Sit to/from stand Lower Body Dressing Details (indicate cue type and reason): HHA assists  Toilet Transfer: Supervision/safety;Comfort height toilet   Toileting- Clothing Manipulation and Hygiene: Supervision/safety;Sit to/from stand Toileting - Clothing Manipulation Details (indicate cue type and reason): reports long standing ho of having difficulty cleaning self after bowel movements but he is able.     Functional mobility during ADLs: Supervision/safety General ADL Comments: Pt appears to be at his baseline.     Vision                     Perception     Praxis      Pertinent Vitals/Pain Pain Assessment: No/denies pain     Hand Dominance Right   Extremity/Trunk Assessment Upper Extremity Assessment Upper Extremity Assessment: Generalized weakness   Lower Extremity Assessment Lower Extremity Assessment: Defer to PT evaluation   Cervical / Trunk Assessment Cervical / Trunk Assessment: Normal   Communication Communication Communication: Tracheostomy   Cognition Arousal/Alertness: Awake/alert Behavior During  Therapy: WFL for tasks assessed/performed Overall Cognitive Status: Within Functional Limits for tasks assessed                     General Comments       Exercises       Shoulder Instructions      Home Living Family/patient expects to be discharged to:: Private residence Living Arrangements: Alone   Type of Home: Apartment Home Access: Level entry     Home Layout: One level     Bathroom Shower/Tub: Occupational psychologist: Standard     Home Equipment: Environmental consultant - 2 wheels;Hand held shower head   Additional Comments:  Pt will not have his apartment after Nov 4th. It has been condemed.      Prior Functioning/Environment Level of Independence: Needs assistance  Gait / Transfers Assistance Needed: Pt walks household distances with rolling walker ADL's / Homemaking Assistance Needed: Pt does own cooking/gets some assist from [facility].   Comments: pt's aid has come for months (until MD did not fill out paperwork?) and she assists with 80% of bathing, some dressing and some home tasks.    OT Diagnosis:     OT Problem List:     OT Treatment/Interventions:      OT Goals(Current goals can be found in the care plan section)    OT Frequency:     Barriers to D/C:            Co-evaluation PT/OT/SLP Co-Evaluation/Treatment: Yes Reason for Co-Treatment: For patient/therapist safety PT goals addressed during session: Mobility/safety with mobility OT goals addressed during session: ADL's and self-care      End of Session Equipment Utilized During Treatment: Rolling walker;Oxygen Nurse Communication: Mobility status  Activity Tolerance: Patient tolerated treatment well Patient left: in chair;with call bell/phone within reach   Time: 4034-7425 OT Time Calculation (min): 30 min Charges:  OT General Charges $OT Visit: 1 Procedure G-Codes:    Glenford Peers 26-Dec-2013, 11:44 AM [phone]

## 2013-11-30 NOTE — Progress Notes (Signed)
Pt refused his 16:00 neb. Pt in no distress at this time.

## 2013-11-30 NOTE — Progress Notes (Signed)
Received referral from inpatient RNCM for [place] [facility] services. Spoke with patient at bedside to confirm his Primary Care MD is Dr Ricke Hey. However, Dr Alyson Ingles is not a [facility] provider. During conversation, patient became tearful about his sister passing several weeks ago. Patient agreeable to speak with Chaplain service. Paged placed to Chaplain on call. To come up to see patient at some point. Spoke with inpatient RNCM to make aware of the above.  Marthenia Rolling, [facility] [phone]

## 2013-12-01 DIAGNOSIS — L89150 Pressure ulcer of sacral region, unstageable: Secondary | ICD-10-CM

## 2013-12-01 LAB — GLUCOSE, CAPILLARY
GLUCOSE-CAPILLARY: 164 mg/dL — AB (ref 70–99)
Glucose-Capillary: 146 mg/dL — ABNORMAL HIGH (ref 70–99)
Glucose-Capillary: 118 mg/dL — ABNORMAL HIGH (ref 70–99)
Glucose-Capillary: 98 mg/dL (ref 70–99)

## 2013-12-01 MED ORDER — METHYLPREDNISOLONE SODIUM SUCC 40 MG IJ SOLR
40.0000 mg | INTRAMUSCULAR | Status: DC
  Administered 2013-12-01: 40 mg via INTRAVENOUS
  Filled 2013-12-01: qty 1

## 2013-12-01 MED ORDER — PROMETHAZINE HCL 25 MG/ML IJ SOLN
25.0000 mg | Freq: Four times a day (QID) | INTRAMUSCULAR | Status: DC | PRN
  Administered 2013-12-01 – 2013-12-02 (×3): 25 mg via INTRAVENOUS
  Filled 2013-12-01 (×4): qty 1

## 2013-12-01 MED ORDER — PROMETHAZINE HCL 25 MG/ML IJ SOLN
12.5000 mg | Freq: Once | INTRAMUSCULAR | Status: AC
  Administered 2013-12-01: 12.5 mg via INTRAVENOUS
  Filled 2013-12-01: qty 1

## 2013-12-01 NOTE — Progress Notes (Signed)
Pt has a trach Shiley #6 uncuff disposable.

## 2013-12-01 NOTE — Progress Notes (Signed)
Patient ID: [name], male   DOB: 1966/01/06, 48 y.o.   MRN: [id number] [facility] PROGRESS NOTE  TERRY BOLOTIN VQM:[id number] DOB: 03/04/1965 DOA: 11/27/2013 PCP: Ricke Hey, MD  Brief narrative: 48 y.o. male with a PMH of chronic respiratory failure s/p trach, OSA/OHS, COPD, CHF/EF 60-65% on last Echo 01/09/10 who was admitted 11/27/13 with a 3 day h/o worsening dypnea, change in sputum (thicker, yellower) and pleuritic type chest pain. CXR clear, afebrile on presentation, but lungs with rhonchi/bronchospasm. Additionally, the patient is in an emotional crisis secondary to multiple losses and poor social circumstance with limited resources/support.   Assessment/Plan:   Principal Problem:  Acute on chronic respiratory failure with acute bronchitis  Respiratory status is stable. Continue azithromycin.  Continue oxygen support via [place] to keep O2 sats above 90% Continue nebulizer treatments QID scheduled and every 2 hours PRN wheezing or shortness of breath.  Continue Solumedrol 40 mg IV Q 24 due to ongoing bronchospasm. Added protonix 40 mg IV daily for GI prophylaxis. Active Problems:  Severe obesity / Type II Diabetes Mellitus  Dietician consult. Note: patient has little resources and often eats Ramen noodles because he cannot afford other sources of nutrition.  Hemoglobin A1c 6.8%. Consistent with new diagnosis of DM. Started on metformin per DM coordinator recommendations.  Poor social situation  SNF placement. Obesity hypoventilation syndrome  Tracheostomy dependent. Decubitus ulcer of sacral area  Continue skin care per WOC recommendations. Left anterior fascicular block  Likely benign, but places the patient at risk for developing atrial fibrillation. DVT prophylaxis  Lovenox ordered.   Code Status: Full.  Family Communication: No family at bedside.  Disposition Plan: to SNF likely in next 24 hours  IV Access:   Peripheral IV Procedures and diagnostic  studies:   Dg Chest Port 1 View 11/27/2013: Study somewhat limited by patient body habitus stable cardiac enlargement and vascular congestion when compared to 11/26/2013, with vascular congestion slightly more prominent when compared to 12/22/2012. There may be minimal interstitial pulmonary edema.  Dg Chest Port 1 View 11/26/2013: Stable cardiomegaly and mild central pulmonary vascular congestion. No significant change compared to prior exam.  Medical Consultants:   None. Anti-Infectives:   Azithromycin 11/27/13--->   Leisa Lenz, MD  [facility] Pager [phone]  If 7PM-7AM, please contact night-coverage [email or web address] Password [email or web address] 12/01/2013, 4:05 PM   LOS: 4 days    HPI/Subjective: Had episode of non bloody vomiting 11/30/2013. Feels little better this am.  Objective: Filed Vitals:   12/01/13 1119 12/01/13 1441 12/01/13 1600 12/01/13 1601  BP:  148/77    Pulse: 100 85  124  Temp:  97.7 F (36.5 C)    TempSrc:  Axillary    Resp: 20 20  20   Height:      Weight:      SpO2: 95% 95% 100% 100%    Intake/Output Summary (Last 24 hours) at 12/01/13 1605 Last data filed at 12/01/13 1300  Gross per 24 hour  Intake   1320 ml  Output   2250 ml  Net   -930 ml    Exam:   General:  Pt is morbidly obese, no distress  Cardiovascular: Regular rate and rhythm, S1/S2 appreciated   Respiratory: diminished breath sounds, no wheezing, no crackles, no rhonchi  Abdomen: Soft, non tender, non distended, bowel sounds present  Extremities: obese extremities, lymphedema, pulses DP and PT palpable bilaterally  Neuro: Grossly nonfocal  Data Reviewed: Basic Metabolic Panel:  Recent Labs Lab 11/26/13 0913 11/27/13  9924 11/30/13 0348  NA 135* 139 136*  K 4.9 4.2 5.3  CL 96 101 95*  CO2 28 27 32  GLUCOSE 130* 143* 226*  BUN 11 14 22   CREATININE 0.85 1.06 0.83  CALCIUM 8.5 8.9 9.2   Liver Function Tests:  Recent Labs Lab 11/27/13 0904  AST 17  ALT 20  ALKPHOS  78  BILITOT 0.5  PROT 7.5  ALBUMIN 3.3*   No results found for this basename: LIPASE, AMYLASE,  in the last 168 hours No results found for this basename: AMMONIA,  in the last 168 hours CBC:  Recent Labs Lab 11/26/13 0913 11/27/13 0904 11/30/13 0348  WBC 15.3* 14.5* 20.2*  NEUTROABS  --  8.7*  --   HGB 13.8 14.3 14.5  HCT 39.7 40.7 42.1  MCV 73.4* 74.5* 75.0*  PLT 303 307 297   Cardiac Enzymes: No results found for this basename: CKTOTAL, CKMB, CKMBINDEX, TROPONINI,  in the last 168 hours BNP: No components found with this basename: POCBNP,  CBG:  Recent Labs Lab 11/29/13 2103 11/30/13 0753 11/30/13 1118 11/30/13 1654 11/30/13 2235  GLUCAP 236* 188* 152* 186* 114*    MRSA PCR SCREENING     Status: None   Collection Time    11/27/13  5:58 PM      Result Value Ref Range Status   MRSA by PCR NEGATIVE  NEGATIVE Final  CULTURE, RESPIRATORY (NON-EXPECTORATED)     Status: None   Collection Time    11/28/13  1:15 PM      Result Value Ref Range Status   Specimen Description TRACHEAL ASPIRATE   Final   Value: Non-Pathogenic Oropharyngeal-type Flora Isolated.     Performed at [facility]   Report Status 11/30/2013 FINAL   Final     Scheduled Meds: . enoxaparin (LOVENOX) injection  0.5 mg/kg Subcutaneous Q24H  . guaiFENesin  600 mg Oral BID  . insulin aspart  0-15 Units Subcutaneous TID WC  . insulin aspart  0-5 Units Subcutaneous QHS  . ipratropium-albuterol  3 mL Nebulization QID  . metFORMIN  750 mg Oral BID WC  . methylPREDNISolone (SOLU-MEDROL) injection  40 mg Intravenous Q24H  . multivitamin  5 mL Oral Daily  . nutrition supplement (JUVEN)  1 packet Oral BID BM  . pantoprazole (PROTONIX) IV  40 mg Intravenous Q24H   Continuous Infusions: . sodium chloride

## 2013-12-01 NOTE — Progress Notes (Signed)
Trach changed today by Dr. Erik Obey; cuffed, 6XLT shiley. Rt will continue to monitor.

## 2013-12-01 NOTE — Progress Notes (Addendum)
Clinical Social Work  CSW met with patient to provide bed offers. Patient reports he wants to go to [facility]. CSW made SNF aware who is agreeable to accept patient whenever he is medically stable.  Sindy Messing, LCSW  (Coverage for [facility])  Addendum 1218 Per MD, patient should be ready to DC tomorrow. SNF aware and agreeable to admission.

## 2013-12-01 NOTE — Progress Notes (Signed)
Chaplain following pt at referral of [facility] case manager.  Mr. Heffern sister died two weeks ago. Sister died from complications related to diabetes - which he has recently been diagnosed.  States sister "just gave up."  First anniversary of brother's death was recently.  Mr. Westbrooks apartment has also been condemned.  Expresses emotional exhaustion due to grief and litany of unfortunate events.  He is currently without housing after rehab.  Additionally, his clothing and toiletries are at apartment where he resided.  He is concerned about having these items at rehab.    Provided support around grief / loss, emotional support related to diagnosis.  Mr. Vanwagner Identifies prayer as a source of strength and requests prayer with chaplain.    [place], [place]

## 2013-12-01 NOTE — Consult Note (Signed)
[name], [name] 48 y.o., male [id number]     Chief Complaint: trach dependent  HPI: 48 yo bm, obesity hypoventilation.  Morbid obesity.  Well known to me.  Last trach change in [place].  Current respiratory issues with trach soiling and odor.    PMH: Past Medical History  Diagnosis Date  . Hypertension   . CHF (congestive heart failure)   . Ventilator dependent   . Sleep apnea     Surg Hx: Past Surgical History  Procedure Laterality Date  . Incision and drainage of wound  12/22/2010    Procedure: IRRIGATION AND DEBRIDEMENT WOUND;  Surgeon: Harl Bowie, MD;  Location: [facility];  Service: General;  Laterality: N/A;  patient on vent  . Wound debridement  12/21/2010    Procedure: DEBRIDEMENT WOUND;  Surgeon: Harl Bowie, MD;  Location: [facility] OR;  Service: General;  Laterality: N/A;  Sacral  . Tracheostomy tube placement      FHx:   Family History  Problem Relation Age of Onset  . Cancer Brother   . Chronic Renal Failure Sister    SocHx:  reports that he has been smoking Cigarettes.  He has been smoking about 0.00 packs per day. He does not have any smokeless tobacco history on file. He reports that he does not drink alcohol or use illicit drugs.  ALLERGIES:  Allergies  Allergen Reactions  . Tramadol Anaphylaxis and Swelling    Tongue   . Ibuprofen Other (See Comments)    Bloody Stool     Medications Prior to Admission  Medication Sig Dispense Refill  . albuterol (PROVENTIL) (2.5 MG/3ML) 0.083% nebulizer solution Take 2.5 mg by nebulization every 6 (six) hours as needed for wheezing or shortness of breath.      . benzonatate (TESSALON) 100 MG capsule Take 1 capsule (100 mg total) by mouth every 8 (eight) hours.  21 capsule  0  . HYDROcodone-acetaminophen (NORCO) 5-325 MG per tablet Take 1-2 tablets by mouth every 6 (six) hours as needed.  20 tablet  0  . losartan (COZAAR) 100 MG tablet Take 100 mg by mouth daily as needed (for high blood pressure).         Results for  orders placed during the hospital encounter of 11/27/13 (from the past 48 hour(s))  GLUCOSE, CAPILLARY     Status: Abnormal   Collection Time    11/29/13  9:03 PM      Result Value Ref Range   Glucose-Capillary 236 (*) 70 - 99 mg/dL  CBC     Status: Abnormal   Collection Time    11/30/13  3:48 AM      Result Value Ref Range   WBC 20.2 (*) 4.0 - 10.5 K/uL   RBC 5.61  4.22 - 5.81 MIL/uL   Hemoglobin 14.5  13.0 - 17.0 g/dL   HCT 42.1  39.0 - 52.0 %   MCV 75.0 (*) 78.0 - 100.0 fL   MCH 25.8 (*) 26.0 - 34.0 pg   MCHC 34.4  30.0 - 36.0 g/dL   RDW 16.4 (*) 11.5 - 15.5 %   Platelets 297  150 - 400 K/uL  BASIC METABOLIC PANEL     Status: Abnormal   Collection Time    11/30/13  3:48 AM      Result Value Ref Range   Sodium 136 (*) 137 - 147 mEq/L   Potassium 5.3  3.7 - 5.3 mEq/L   Chloride 95 (*) 96 - 112 mEq/L  CO2 32  19 - 32 mEq/L   Glucose, Bld 226 (*) 70 - 99 mg/dL   BUN 22  6 - 23 mg/dL   Creatinine, Ser 0.83  0.50 - 1.35 mg/dL   Calcium 9.2  8.4 - 10.5 mg/dL   GFR calc non Af Amer >90  >90 mL/min   GFR calc Af Amer >90  >90 mL/min   Comment: (NOTE)     The eGFR has been calculated using the CKD EPI equation.     This calculation has not been validated in all clinical situations.     eGFR's persistently <90 mL/min signify possible Chronic Kidney     Disease.   Anion gap 9  5 - 15  GLUCOSE, CAPILLARY     Status: Abnormal   Collection Time    11/30/13  7:53 AM      Result Value Ref Range   Glucose-Capillary 188 (*) 70 - 99 mg/dL  GLUCOSE, CAPILLARY     Status: Abnormal   Collection Time    11/30/13 11:18 AM      Result Value Ref Range   Glucose-Capillary 152 (*) 70 - 99 mg/dL  GLUCOSE, CAPILLARY     Status: Abnormal   Collection Time    11/30/13  4:54 PM      Result Value Ref Range   Glucose-Capillary 186 (*) 70 - 99 mg/dL  GLUCOSE, CAPILLARY     Status: Abnormal   Collection Time    11/30/13 10:35 PM      Result Value Ref Range   Glucose-Capillary 114 (*) 70 - 99  mg/dL   Comment 1 Documented in Chart     Comment 2 Notify RN     No results found.    Blood pressure 148/77, pulse 124, temperature 97.7 F (36.5 C), temperature source Axillary, resp. rate 20, height _0  (1.702 m), weight 182.074 kg (401 lb 6.4 oz), SpO2 100.00%.  PHYSICAL EXAM: Overall appearance:  Still very obese.  Mental status intact Head:  NCAT Ears: not examined Nose:  Not examined Oral Cavity:  Not examined Oral Pharynx/Hypopharynx/Larynx:  Not examined Neck:  Obese.  Trach in place with soiled ties, and strong odor. Neuro:  Grossly intact   Assessment/Plan Trach dependent.  Obesity hypoventilation.  Morbid obesity.  OSA  Trach changed without difficulty.  Recommend repeat change q2 mos.  Jodi Marble 76/54/6503, 5:45 PM

## 2013-12-02 DIAGNOSIS — J9600 Acute respiratory failure, unspecified whether with hypoxia or hypercapnia: Secondary | ICD-10-CM | POA: Diagnosis not present

## 2013-12-02 DIAGNOSIS — R0602 Shortness of breath: Secondary | ICD-10-CM | POA: Diagnosis not present

## 2013-12-02 DIAGNOSIS — Z930 Tracheostomy status: Secondary | ICD-10-CM | POA: Diagnosis not present

## 2013-12-02 DIAGNOSIS — K219 Gastro-esophageal reflux disease without esophagitis: Secondary | ICD-10-CM | POA: Diagnosis not present

## 2013-12-02 DIAGNOSIS — E669 Obesity, unspecified: Secondary | ICD-10-CM | POA: Diagnosis not present

## 2013-12-02 DIAGNOSIS — I444 Left anterior fascicular block: Secondary | ICD-10-CM | POA: Diagnosis not present

## 2013-12-02 DIAGNOSIS — L89150 Pressure ulcer of sacral region, unstageable: Secondary | ICD-10-CM | POA: Diagnosis not present

## 2013-12-02 DIAGNOSIS — J449 Chronic obstructive pulmonary disease, unspecified: Secondary | ICD-10-CM | POA: Diagnosis not present

## 2013-12-02 DIAGNOSIS — J9621 Acute and chronic respiratory failure with hypoxia: Secondary | ICD-10-CM | POA: Diagnosis not present

## 2013-12-02 DIAGNOSIS — M6281 Muscle weakness (generalized): Secondary | ICD-10-CM | POA: Diagnosis not present

## 2013-12-02 DIAGNOSIS — E118 Type 2 diabetes mellitus with unspecified complications: Secondary | ICD-10-CM | POA: Diagnosis not present

## 2013-12-02 DIAGNOSIS — J40 Bronchitis, not specified as acute or chronic: Secondary | ICD-10-CM | POA: Diagnosis not present

## 2013-12-02 DIAGNOSIS — J9611 Chronic respiratory failure with hypoxia: Secondary | ICD-10-CM | POA: Diagnosis not present

## 2013-12-02 DIAGNOSIS — I10 Essential (primary) hypertension: Secondary | ICD-10-CM | POA: Diagnosis not present

## 2013-12-02 LAB — GLUCOSE, CAPILLARY
Glucose-Capillary: 128 mg/dL — ABNORMAL HIGH (ref 70–99)
Glucose-Capillary: 107 mg/dL — ABNORMAL HIGH (ref 70–99)

## 2013-12-02 MED ORDER — PANTOPRAZOLE SODIUM 40 MG PO TBEC: 40.0000 mg | DELAYED_RELEASE_TABLET | Freq: Every day | ORAL | Status: DC

## 2013-12-02 MED ORDER — CETYLPYRIDINIUM CHLORIDE 0.05 % MT LIQD: 7.0000 mL | Freq: Two times a day (BID) | OROMUCOSAL | Status: DC

## 2013-12-02 MED ORDER — CHLORHEXIDINE GLUCONATE 0.12 % MT SOLN: 15.0000 mL | Freq: Two times a day (BID) | OROMUCOSAL | Status: DC

## 2013-12-02 MED ORDER — PREDNISONE 5 MG PO TABS: 50.0000 mg | ORAL_TABLET | Freq: Every day | ORAL | Status: DC

## 2013-12-02 MED ORDER — GUAIFENESIN ER 600 MG PO TB12: 600.0000 mg | ORAL_TABLET | Freq: Two times a day (BID) | ORAL | Status: DC | PRN

## 2013-12-02 MED ORDER — JUVEN PO PACK: 1.0000 | PACK | Freq: Two times a day (BID) | ORAL | Status: DC

## 2013-12-02 MED ORDER — INSULIN ASPART 100 UNIT/ML ~~LOC~~ SOLN: 0.0000 [IU] | Freq: Three times a day (TID) | SUBCUTANEOUS | Status: DC

## 2013-12-02 MED ORDER — HYDROCODONE-ACETAMINOPHEN 5-325 MG PO TABS: 1.0000 | ORAL_TABLET | Freq: Four times a day (QID) | ORAL | Status: DC | PRN

## 2013-12-02 MED ORDER — ONDANSETRON HCL 4 MG PO TABS: 4.0000 mg | ORAL_TABLET | Freq: Three times a day (TID) | ORAL | Status: DC | PRN

## 2013-12-02 MED ORDER — ALUM & MAG HYDROXIDE-SIMETH 200-200-20 MG/5ML PO SUSP: 30.0000 mL | Freq: Four times a day (QID) | ORAL | Status: DC | PRN

## 2013-12-02 MED ORDER — METFORMIN HCL ER 750 MG PO TB24: 750.0000 mg | ORAL_TABLET | Freq: Two times a day (BID) | ORAL | Status: DC

## 2013-12-02 NOTE — Progress Notes (Signed)
RT Note: Pt tolerating well on 28% ATC with #6 XLT cuffless trach, HR 82, RR 20, SpO2 97%. RT will continue to monitor.

## 2013-12-02 NOTE — Discharge Summary (Signed)
Physician Discharge Summary  [name] QMV:[id number] DOB: March 31, 1965 DOA: 11/27/2013  PCP: Ricke Hey, MD  Admit date: 11/27/2013 Discharge date: 12/02/2013  Recommendations for Outpatient Follow-up:  1. Taper down prednisone starting from 50 mg a day, taper down by 5 mg a day down to 0 mg. for ex, today 50 mg, tomorrow 45 mg, then 40 mg the following day and etc... 2. Take metformin for diabetes. May use sliding scale insulin if CBG's uncontrolled since patient will be on tapering dose of steroids.   Wound care: Healed PrU at coccyx, Moisture associated skin damage (MASD), specifically intertriginous dermatitis (ITD)  Wound type:Pressure with Moisture  Pressure Ulcer POA: Yes  Measurement:ITD: 1cm x 8cm x 0.2cm  Wound MWU:XLKG, moist  Drainage (amount, consistency, odor)  Periwound:white/grey macerated tissue  Dressing procedure/placement/frequency: Air mattress to promote airflow while in bed as well as a topical product for moisture management (InterDry Ag+). Bariatric pressure redistribution chair cushion.     Discharge Diagnoses:  Principal Problem:   Acute on chronic respiratory failure Active Problems:   Obesity hypoventilation syndrome   Decubitus ulcer of sacral area   Bronchitis   Left anterior fascicular block   Poor social situation   Severe obesity (BMI >= 40)   Type II diabetes mellitus    Discharge Condition: stable   Diet recommendation: as tolerated   History of present illness:  48 y.o. male with a PMH of chronic respiratory failure s/p trach, OSA/OHS, COPD, CHF/EF 60-65% on last Echo 01/09/10 who was admitted 11/27/13 with a 3 day h/o worsening dypnea, change in sputum (thicker, yellower) and pleuritic type chest pain. CXR clear, afebrile on presentation, but lungs with rhonchi/bronchospasm. Additionally, the patient is in an emotional crisis secondary to multiple losses and poor social circumstance with limited resources/support.    Assessment/Plan:   Principal Problem:  Acute on chronic respiratory failure with acute bronchitis  Respiratory status is stable. Patient has received appropriate course of azithromycin, today would be day 6. No need to continue beyond today.  Continue oxygen support via [place] to keep O2 sats above 90%; trach in place. Continue nebulizer treatments as prescribed if needed every 4-6 hours for shortness of breath of wheezing.  Continue prednisone taper as mentioned above. Added protonix since pt on steroids.  Active Problems:  Severe obesity / Type II Diabetes Mellitus  Dietician consult. Note: patient has little resources and often eats Ramen noodles because he cannot afford other sources of nutrition.  Hemoglobin A1c 6.8%. Consistent with new diagnosis of DM. Started on metformin per DM coordinator recommendations.  Poor social situation  SNF placement. Obesity hypoventilation syndrome  Tracheostomy dependent. Decubitus ulcer of sacral area  Continue skin care per [facility] recommendations. Left anterior fascicular block  Likely benign, but places the patient at risk for developing atrial fibrillation. DVT prophylaxis  Lovenox ordered while pt is in hospital.    Code Status: Full.  Family Communication: No family at bedside.   IV Access:   Peripheral IV Procedures and diagnostic studies:   Dg Chest Port 1 View 11/27/2013: Study somewhat limited by patient body habitus stable cardiac enlargement and vascular congestion when compared to 11/26/2013, with vascular congestion slightly more prominent when compared to 12/22/2012. There may be minimal interstitial pulmonary edema.  Dg Chest Port 1 View 11/26/2013: Stable cardiomegaly and mild central pulmonary vascular congestion. No significant change compared to prior exam.  Medical Consultants:   None. Anti-Infectives:   Azithromycin 11/27/13--->   Signed:  Leisa Lenz, MD  [facility] 12/02/2013, 10:06 AM  Pager #:  [phone]   Discharge Exam: Filed Vitals:   12/02/13 0811  BP:   Pulse: 69  Temp:   Resp: 20   Filed Vitals:   12/01/13 1601 12/01/13 2110 12/02/13 0437 12/02/13 0811  BP:  118/91 131/70   Pulse: 124 88 88 69  Temp:  98.1 F (36.7 C) 98.5 F (36.9 C)   TempSrc:  Oral Oral   Resp: 20 19 19 20   Height:      Weight:      SpO2: 100% 96% 94% 97%    General: Pt is alert, follows commands appropriately, not in acute distress; trach in place, morbidly obese Cardiovascular: Regular rate and rhythm, S1/S2 +, no murmurs Respiratory: diminished breath sounds due to body habitus otherwise no wheezing  Abdominal: Soft, non tender, non distended, bowel sounds +, no guarding Extremities: no cyanosis, pulses palpable bilaterally DP and PT Neuro: Grossly nonfocal  Discharge Instructions  Discharge Instructions   Call MD for:  difficulty breathing, headache or visual disturbances    Complete by:  As directed      Call MD for:  persistant dizziness or light-headedness    Complete by:  As directed      Call MD for:  persistant nausea and vomiting    Complete by:  As directed      Call MD for:  severe uncontrolled pain    Complete by:  As directed      Diet - low sodium heart healthy    Complete by:  As directed      Discharge instructions    Complete by:  As directed   1. Taper down prednisone starting from 50 mg a day, taper down by 5 mg a day down to 0 mg. for ex, today 50 mg, tomorrow 45 mg, then 40 mg the following day and etc... 2. Take metformin for diabetes. May use sliding scale insulin if CBG's uncontrolled since patient will be on tapering dose of steroids.     Increase activity slowly    Complete by:  As directed             Medication List         albuterol (2.5 MG/3ML) 0.083% nebulizer solution  Commonly known as:  PROVENTIL  Take 2.5 mg by nebulization every 6 (six) hours as needed for wheezing or shortness of breath.     alum & mag hydroxide-simeth  200-200-20 MG/5ML suspension  Commonly known as:  MAALOX/MYLANTA  Take 30 mLs by mouth every 6 (six) hours as needed for indigestion or heartburn (dyspepsia).     antiseptic oral rinse 0.05 % Liqd solution  Commonly known as:  CPC / CETYLPYRIDINIUM CHLORIDE 0.05%  7 mLs by Mouth Rinse route 2 times daily at 12 noon and 4 pm.     benzonatate 100 MG capsule  Commonly known as:  TESSALON  Take 1 capsule (100 mg total) by mouth every 8 (eight) hours.     chlorhexidine 0.12 % solution  Commonly known as:  PERIDEX  15 mLs by Mouth Rinse route 2 (two) times daily.     guaiFENesin 600 MG 12 hr tablet  Commonly known as:  MUCINEX  Take 1 tablet (600 mg total) by mouth 2 (two) times daily as needed.     HYDROcodone-acetaminophen 5-325 MG per tablet  Commonly known as:  NORCO  Take 1-2 tablets by mouth every 6 (six) hours as needed.     insulin  aspart 100 UNIT/ML injection  Commonly known as:  novoLOG  Inject 0-15 Units into the skin 3 (three) times daily with meals.     losartan 100 MG tablet  Commonly known as:  COZAAR  Take 100 mg by mouth daily as needed (for high blood pressure).     metFORMIN 750 MG 24 hr tablet  Commonly known as:  GLUCOPHAGE-XR  Take 1 tablet (750 mg total) by mouth 2 (two) times daily with a meal.     nutrition supplement (JUVEN) Pack  Take 1 packet by mouth 2 (two) times daily between meals.     ondansetron 4 MG tablet  Commonly known as:  ZOFRAN  Take 1 tablet (4 mg total) by mouth every 8 (eight) hours as needed for nausea or vomiting.     pantoprazole 40 MG tablet  Commonly known as:  PROTONIX  Take 1 tablet (40 mg total) by mouth daily.     predniSONE 5 MG tablet  Commonly known as:  DELTASONE  Take 10 tablets (50 mg total) by mouth daily with breakfast.           Follow-up Information   Follow up with Ricke Hey, MD. Schedule an appointment as soon as possible for a visit in 2 weeks. (Follow up appt after recent hospitalization)     Specialty:  Family Medicine   Contact information:   [address] [place] [address] [phone]        The results of significant diagnostics from this hospitalization (including imaging, microbiology, ancillary and laboratory) are listed below for reference.    Significant Diagnostic Studies: Dg Chest Port 1 View  11/27/2013   CLINICAL DATA:  Shortness of breath today, initial evaluation, personal history of congestive heart failure and hypertension as well as smoking  EXAM: PORTABLE CHEST - 1 VIEW  COMPARISON:  11/26/2013, 12/22/2012  FINDINGS: Mild stable cardiac enlargement. Mild central pulmonary vascular congestion is stable from 11/26/2013 but slightly more prominent when compared to 12/22/2012. No focal consolidation or effusion. Mild perihilar peribronchial wall thickening. Minimal interstitial changes. Tracheostomy tube unchanged.  IMPRESSION: Study somewhat limited by patient body habitus stable cardiac enlargement and vascular congestion when compared to 11/26/2013, with vascular congestion slightly more prominent when compared to 12/22/2012. There may be minimal interstitial pulmonary edema.   Electronically Signed   By: Skipper Cliche M.D.   On: 11/27/2013 10:01   Dg Chest Port 1 View  11/26/2013   CLINICAL DATA:  Shortness of breath.  EXAM: PORTABLE CHEST - 1 VIEW  COMPARISON:  December 22, 2012.  FINDINGS: Stable cardiomegaly. Tracheostomy is unchanged in position. Stable mild central pulmonary vascular congestion is noted. No pneumothorax or pleural effusion is noted. Bony thorax is intact.  IMPRESSION: Stable cardiomegaly and mild central pulmonary vascular congestion. No significant change compared to prior exam.   Electronically Signed   By: Sabino Dick M.D.   On: 11/26/2013 09:38    Microbiology: Recent Results (from the past 240 hour(s))  MRSA PCR SCREENING     Status: None   Collection Time    11/27/13  5:58 PM      Result Value Ref Range Status   MRSA  by PCR NEGATIVE  NEGATIVE Final   Comment:            The GeneXpert MRSA Assay (FDA     approved for NASAL specimens     only), is one component of a     comprehensive MRSA colonization  surveillance program. It is not     intended to diagnose MRSA     infection nor to guide or     monitor treatment for     MRSA infections.  CULTURE, RESPIRATORY (NON-EXPECTORATED)     Status: None   Collection Time    11/28/13  1:15 PM      Result Value Ref Range Status   Specimen Description TRACHEAL ASPIRATE   Final   Special Requests NONE   Final   Gram Stain     Final   Value: FEW WBC PRESENT,BOTH PMN AND MONONUCLEAR     RARE SQUAMOUS EPITHELIAL CELLS PRESENT     FEW GRAM POSITIVE COCCI     IN PAIRS IN CHAINS FEW GRAM POSITIVE RODS     Performed at [facility]   Culture     Final   Value: Non-Pathogenic Oropharyngeal-type Flora Isolated.     Performed at [facility]   Report Status 11/30/2013 FINAL   Final     Labs: Basic Metabolic Panel:  Recent Labs Lab 11/26/13 0913 11/27/13 0904 11/30/13 0348  NA 135* 139 136*  K 4.9 4.2 5.3  CL 96 101 95*  CO2 28 27 32  GLUCOSE 130* 143* 226*  BUN 11 14 22   CREATININE 0.85 1.06 0.83  CALCIUM 8.5 8.9 9.2   Liver Function Tests:  Recent Labs Lab 11/27/13 0904  AST 17  ALT 20  ALKPHOS 78  BILITOT 0.5  PROT 7.5  ALBUMIN 3.3*   No results found for this basename: LIPASE, AMYLASE,  in the last 168 hours No results found for this basename: AMMONIA,  in the last 168 hours CBC:  Recent Labs Lab 11/26/13 0913 11/27/13 0904 11/30/13 0348  WBC 15.3* 14.5* 20.2*  NEUTROABS  --  8.7*  --   HGB 13.8 14.3 14.5  HCT 39.7 40.7 42.1  MCV 73.4* 74.5* 75.0*  PLT 303 307 297   Cardiac Enzymes: No results found for this basename: CKTOTAL, CKMB, CKMBINDEX, TROPONINI,  in the last 168 hours BNP: BNP (last 3 results)  Recent Labs  11/26/13 0913 11/27/13 0904  PROBNP 37.9 41.6   CBG:  Recent Labs Lab  12/01/13 0811 12/01/13 1157 12/01/13 1712 12/01/13 2140 12/02/13 0740  GLUCAP 146* 118* 98 164* 128*    Time coordinating discharge: Over 30 minutes

## 2013-12-02 NOTE — Discharge Instructions (Signed)
Acute Respiratory Failure °Respiratory failure is when your lungs are not working well and your breathing (respiratory) system fails. When respiratory failure occurs, it is difficult for your lungs to get enough oxygen, get rid of carbon dioxide, or both. Respiratory failure can be life threatening.  °Respiratory failure can be acute or chronic. Acute respiratory failure is sudden, severe, and requires emergency medical treatment. Chronic respiratory failure is less severe, happens over time, and requires ongoing treatment.  °WHAT ARE THE CAUSES OF ACUTE RESPIRATORY FAILURE?  °Any problem affecting the heart or lungs can cause acute respiratory failure. Some of these causes include the following: °· Chronic bronchitis and emphysema (COPD).   °· Blood clot going to a lung (pulmonary embolism).   °· Having water in the lungs caused by heart failure, lung injury, or infection (pulmonary edema).   °· Collapsed lung (pneumothorax).   °· Pneumonia.   °· Pulmonary fibrosis.   °· Obesity.   °· Asthma.   °· Heart failure.   °· Any type of trauma to the chest that can make breathing difficult.   °· Nerve or muscle diseases making chest movements difficult. °WHAT SYMPTOMS SHOULD YOU WATCH FOR?  °If you have any of these signs or symptoms, you should seek immediate medical care:  °· You have shortness of breath (dyspnea) with or without activity.   °· You have rapid, fast breathing (tachypnea).   °· You are wheezing. °· You are unable to say more than a few words without having to catch your breath. °· You find it very difficult to function normally. °· You have a fast heart rate.   °· You have a bluish color to your finger or toe nail beds.   °· You have confusion or drowsiness or both.   °HOW WILL MY ACUTE RESPIRATORY FAILURE BE TREATED?  °Treatment of acute respiratory failure depends on the cause of the respiratory failure. Usually, you will stay in the intensive care unit so your breathing can be watched closely. Treatment  can include the following: °· Oxygen. Oxygen can be delivered through the following: °¨ Nasal cannula. This is small tubing that goes in your nose to give you oxygen. °¨ Face mask. A face mask covers your nose and mouth to give you oxygen. °· Medicine. Different medicines can be given to help with breathing. These can include: °¨ Nebulizers. Nebulizers deliver medicines to open the air passages (bronchodilators). These medicines help to open or relax the airways in the lungs so you can breathe better. They can also help loosen mucus from your lungs. °¨ Diuretics. Diuretic medicines can help you breathe better by getting rid of extra water in your body. °¨ Steroids. Steroid medicines can help decrease swelling (inflammation) in your lungs. °¨ Antibiotics. °· Chest tube. If you have a collapsed lung (pneumothorax), a chest tube is placed to help reinflate the lung. °· Non-invasive positive pressure ventilation (NPPV). This is a tight-fitting mask that goes over your nose and mouth. The mask has tubing that is attached to a machine. The machine blows air into the tubing, which helps to keep the tiny air sacs (alveoli) in your lungs open. This machine allows you to breathe on your own. °· Ventilator. A ventilator is a breathing machine. When on a ventilator, a breathing tube is put into the lungs. A ventilator is used when you can no longer breathe well enough on your own. You may have low oxygen levels or high carbon dioxide (CO2) levels in your blood. When you are on a ventilator, sedation and pain medicines are given to make you sleep   so your lungs can heal. °Document Released: 01/26/2013 Document Revised: 06/07/2013 Document Reviewed: 01/26/2013 °ExitCare® Patient Information ©2015 [employer]. This information is not intended to replace advice given to you by your health care provider. Make sure you discuss any questions you have with your health care provider. ° °

## 2013-12-02 NOTE — Progress Notes (Signed)
Clinical Social Work  CSW faxed DC summary to [employer] and respiratory note and SNF is agreeable to accept patient today. CSW let patient know of DC plans and patient was appreciative of CSW assistance but reports no further needs at this time. Patient reports CSW does not need to call anyone and that he will let friends know of DC plans. CSW prepared DC packet with FL2, DC summary and hard scripts included. RN to call report. PTAR #: [id number].  CSW is signing off but available if needed.  Sindy Messing, LCSW (Coverage for [employer])

## 2013-12-27 DIAGNOSIS — J9500 Unspecified tracheostomy complication: Secondary | ICD-10-CM | POA: Diagnosis not present

## 2013-12-27 DIAGNOSIS — K5900 Constipation, unspecified: Secondary | ICD-10-CM | POA: Diagnosis not present

## 2013-12-27 DIAGNOSIS — I509 Heart failure, unspecified: Secondary | ICD-10-CM | POA: Diagnosis not present

## 2014-01-26 DIAGNOSIS — M25559 Pain in unspecified hip: Secondary | ICD-10-CM | POA: Diagnosis not present

## 2014-01-26 DIAGNOSIS — J9500 Unspecified tracheostomy complication: Secondary | ICD-10-CM | POA: Diagnosis not present

## 2014-02-15 ENCOUNTER — Encounter (HOSPITAL_COMMUNITY): Payer: Self-pay | Admitting: *Deleted

## 2014-02-15 ENCOUNTER — Emergency Department (INDEPENDENT_AMBULATORY_CARE_PROVIDER_SITE_OTHER)
Admission: EM | Admit: 2014-02-15 | Discharge: 2014-02-15 | Disposition: A | Discharge status: Home / Self Care | Payer: Medicare Other | Source: Home / Self Care | Attending: Family Medicine | Admitting: Family Medicine

## 2014-02-15 DIAGNOSIS — S20221A Contusion of right back wall of thorax, initial encounter: Secondary | ICD-10-CM | POA: Diagnosis not present

## 2014-02-15 MED ORDER — METHOCARBAMOL 500 MG PO TABS: 500.0000 mg | ORAL_TABLET | Freq: Three times a day (TID) | ORAL | Status: DC
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
## 2014-02-15 NOTE — Discharge Instructions (Signed)
Heat and medicine for soreness as needed, see your doctor if further problems.

## 2014-02-17 ENCOUNTER — Encounter (HOSPITAL_COMMUNITY): Payer: Self-pay | Admitting: Surgery

## 2014-02-22 DIAGNOSIS — E662 Morbid (severe) obesity with alveolar hypoventilation: Secondary | ICD-10-CM | POA: Diagnosis not present

## 2014-02-22 DIAGNOSIS — Z430 Encounter for attention to tracheostomy: Secondary | ICD-10-CM | POA: Diagnosis not present

## 2014-02-25 DIAGNOSIS — I509 Heart failure, unspecified: Secondary | ICD-10-CM | POA: Diagnosis not present

## 2014-02-25 DIAGNOSIS — J9500 Unspecified tracheostomy complication: Secondary | ICD-10-CM | POA: Diagnosis not present

## 2014-03-29 DIAGNOSIS — R0789 Other chest pain: Secondary | ICD-10-CM | POA: Diagnosis not present

## 2014-03-29 DIAGNOSIS — J122 Parainfluenza virus pneumonia: Secondary | ICD-10-CM | POA: Diagnosis not present

## 2014-03-29 DIAGNOSIS — R531 Weakness: Secondary | ICD-10-CM | POA: Diagnosis not present

## 2014-03-29 DIAGNOSIS — G4483 Primary cough headache: Secondary | ICD-10-CM | POA: Diagnosis not present

## 2014-05-02 ENCOUNTER — Ambulatory Visit: Payer: Medicare Other | Attending: Family Medicine | Admitting: Family Medicine

## 2014-05-02 ENCOUNTER — Encounter: Payer: Self-pay | Admitting: Family Medicine

## 2014-05-02 VITALS — BP 109/70 | HR 84 | Temp 98.7°F | Resp 18 | Ht 67.5 in | Wt >= 6400 oz

## 2014-05-02 DIAGNOSIS — Z6841 Body Mass Index (BMI) 40.0 and over, adult: Secondary | ICD-10-CM | POA: Diagnosis not present

## 2014-05-02 DIAGNOSIS — E662 Morbid (severe) obesity with alveolar hypoventilation: Secondary | ICD-10-CM | POA: Diagnosis not present

## 2014-05-02 DIAGNOSIS — R0602 Shortness of breath: Secondary | ICD-10-CM | POA: Insufficient documentation

## 2014-05-02 DIAGNOSIS — F17210 Nicotine dependence, cigarettes, uncomplicated: Secondary | ICD-10-CM | POA: Diagnosis not present

## 2014-05-02 DIAGNOSIS — Z930 Tracheostomy status: Secondary | ICD-10-CM | POA: Insufficient documentation

## 2014-05-02 DIAGNOSIS — E119 Type 2 diabetes mellitus without complications: Secondary | ICD-10-CM | POA: Diagnosis not present

## 2014-05-02 DIAGNOSIS — E785 Hyperlipidemia, unspecified: Secondary | ICD-10-CM

## 2014-05-02 DIAGNOSIS — R0981 Nasal congestion: Secondary | ICD-10-CM

## 2014-05-02 DIAGNOSIS — L89153 Pressure ulcer of sacral region, stage 3: Secondary | ICD-10-CM | POA: Diagnosis not present

## 2014-05-02 DIAGNOSIS — E1169 Type 2 diabetes mellitus with other specified complication: Secondary | ICD-10-CM

## 2014-05-02 DIAGNOSIS — I517 Cardiomegaly: Secondary | ICD-10-CM | POA: Diagnosis not present

## 2014-05-02 DIAGNOSIS — L89159 Pressure ulcer of sacral region, unspecified stage: Secondary | ICD-10-CM | POA: Insufficient documentation

## 2014-05-02 DIAGNOSIS — F17200 Nicotine dependence, unspecified, uncomplicated: Secondary | ICD-10-CM | POA: Insufficient documentation

## 2014-05-02 DIAGNOSIS — Z720 Tobacco use: Secondary | ICD-10-CM

## 2014-05-02 LAB — LIPID PANEL
CHOLESTEROL: 187 mg/dL (ref 0–200)
HDL: 36 mg/dL — AB (ref >=40)
LDL Cholesterol: 130 mg/dL — ABNORMAL HIGH (ref 0–99)
Total CHOL/HDL Ratio: 5.2 Ratio
Triglycerides: 106 mg/dL (ref <150)
VLDL: 21 mg/dL (ref 0–40)

## 2014-05-02 LAB — POCT GLYCOSYLATED HEMOGLOBIN (HGB A1C): Hemoglobin A1C: 6.9

## 2014-05-02 LAB — GLUCOSE, POCT (MANUAL RESULT ENTRY): POC GLUCOSE: 195 mg/dL — AB (ref 70–99)

## 2014-05-02 LAB — VITAMIN B12: VITAMIN B 12: 574 pg/mL (ref 211–911)

## 2014-05-02 MED ORDER — ACCU-CHEK NANO SMARTVIEW W/DEVICE KIT: 1.0000 | PACK | Status: DC | PRN

## 2014-05-02 MED ORDER — GLUCOSE BLOOD VI STRP: 1.0000 | ORAL_STRIP | Freq: Three times a day (TID) | Status: DC

## 2014-05-02 MED ORDER — LOSARTAN POTASSIUM 100 MG PO TABS: 100.0000 mg | ORAL_TABLET | Freq: Every day | ORAL | Status: DC

## 2014-05-02 MED ORDER — METFORMIN HCL 1000 MG PO TABS: 1000.0000 mg | ORAL_TABLET | Freq: Two times a day (BID) | ORAL | Status: DC

## 2014-05-02 MED ORDER — ACCU-CHEK FASTCLIX LANCETS MISC: 1.0000 | Freq: Three times a day (TID) | Status: DC

## 2014-05-02 NOTE — Assessment & Plan Note (Signed)
A: not ready to quit P: counseling provided

## 2014-05-02 NOTE — Patient Instructions (Addendum)
[name],  Thank you for coming in today. It was a pleasure meeting you. I look forward to being your primary doctor.   1. Diabetes: A1c at goal if < 7 CBG goals Fasting (in AM before breakfast) 90-110 2 hrs after meals , 160 No low blood sugars, sugar < 70  How to reach goals? Metformin 1000 mg twice daily after meals. Low carb diet Referral to diabetes education.   Diet Recommendations for Diabetes   Starchy (carb) foods include: Bread, rice, pasta, potatoes, corn, crackers, bagels, muffins, all baked goods.   Protein foods include: Meat, fish, poultry, eggs, dairy foods, and beans such as pinto and kidney beans (beans also provide carbohydrate).   1. Eat at least 3 meals and 1-2 snacks per day. Never go more than 4-5 hours while awake without eating.  2. Limit starchy foods to TWO per meal and ONE per snack. ONE portion of a starchy  food is equal to the following:   - ONE slice of bread (or its equivalent, such as half of a hamburger bun).   - 1/2 cup of a "scoopable" starchy food such as potatoes or rice.   - 1 OUNCE (28 grams) of starchy snack foods such as crackers or pretzels (look on label).   - 15 grams of carbohydrate as shown on food label.  3. Both lunch and dinner should include a protein food, a carb food, and vegetables.   - Obtain twice as many veg's as protein or carbohydrate foods for both lunch and dinner.   - Try to keep frozen veg's on hand for a quick vegetable serving.     - Fresh or frozen veg's are best.  4. Breakfast should always include protein.     2. Sacral ulcer: referral back to wound care center due to ulcer size, 13 cm w  x 2.5 cm l x 3 cm d,  and the amount of time it's been present, 4 years. I strongly believe you need regular care at a wound care center.   3. Compensated congestive heart failure: referral to cardiology, [facility] placed today.  4. Smoking: Smoking cessation support: smoking cessation hotline: 1-800-QUIT-NOW.   Smoking cessation classes are available through [facility]. Call [phone] or visit our website at [email or web address]  F/u in 4 weeks with RN for CBG review. F/u in 3 months with me for diabetes.   Dr. Adrian Blackwater

## 2014-05-02 NOTE — Assessment & Plan Note (Signed)
1. Diabetes: A1c at goal if < 7 CBG goals Fasting (in AM before breakfast) 90-110 2 hrs after meals , 160 No low blood sugars, sugar < 70  How to reach goals? Metformin 1000 mg twice daily after meals. Low carb diet Referral to diabetes education.

## 2014-05-02 NOTE — Progress Notes (Signed)
Establish Care  Bed sores, Stated need home health Care  Stated has constantly pain

## 2014-05-02 NOTE — Assessment & Plan Note (Addendum)
?   congestive heart failure: referral to cardiology, [facility] placed today.

## 2014-05-02 NOTE — Progress Notes (Signed)
   Subjective:    Patient ID: [name], male    DOB: 1965/04/01, 49 y.o.   MRN: [id number] CC: sacral decubitus ulcer x 4 years, diabetes  HPI   1. Sacral decub: since 2012. Treated in wound care center on [address]. Patient stopped going due to feeling offended. Wound has never completely healed. No drainage. No fever. Mild odor. Treating at home with rubbing alcohol.   2. DM2: diagnosed in 12/2013. No insulin. Intermittent metformin use. Does not check CBGs.   3. Cardiomegaly: noted on CXR with vascular congestion on most recent CXR 12/2013. Reportedly dx in 2012, per patient.  No lasix, BB, cardiologist. Always SOB. Weight stable. Previous ECHOs, poor studies.   Soc Hx: smoking 2 cigs per day Med Hx:  Reports CHF dx in 2012, does not have a cardiologist. ECHO x 2 in chart, '09 and 2011, poor quality studies with normal EF  Surg Hx: s/p perm trach   Review of Systems  Constitutional: Negative for fever, chills and unexpected weight change.  HENT: Positive for hearing loss.   Respiratory: Positive for cough, shortness of breath and stridor.   Cardiovascular: Negative for chest pain.  Genitourinary: Negative for frequency.  Musculoskeletal: Positive for myalgias.  Skin: Positive for wound.  Allergic/Immunologic: Negative for immunocompromised state.  Neurological: Negative for syncope and weakness.  Hematological: Negative.   Psychiatric/Behavioral: The patient is nervous/anxious.        Objective:   Physical Exam BP 109/70 mmHg  Pulse 84  Temp(Src) 98.7 F (37.1 C) (Oral)  Resp 18  Ht 5' 7.5" (1.715 m)  Wt 400 lb (181.439 kg)  BMI 61.69 kg/m2  SpO2 95%  Wt Readings from Last 3 Encounters:  05/02/14 400 lb (181.439 kg)  11/28/13 401 lb 6.4 oz (182.074 kg)  12/25/10 396 lb 13.3 oz (180 kg)  General appearance: alert, cooperative, no distress and morbidly obese Ears: normal TM's and external ear canals both ears  Nose: swollen nasal turbinates Throat: poor dentition,  normal oropharynx  Neck: trach in place  Lungs: clear to auscultation bilaterally Heart: regular rate and rhythm, S1, S2 normal, no murmur, click, rub or gallop Skin: 13 cm wide, 2.5 cm length, 3 cm depth sacral decubitus ulcer Psych: intermittently tearful during exam   Lab Results  Component Value Date   HGBA1C 6.90 05/02/2014   CBG 195      Assessment & Plan:

## 2014-05-02 NOTE — Assessment & Plan Note (Signed)
Sacral ulcer: referral back to wound care center due to ulcer size, 13 cm w  x 2.5 cm l x 3 cm d,  and the amount of time it's been present, 4 years. I strongly believe you need regular care at a wound care center.

## 2014-05-03 DIAGNOSIS — E119 Type 2 diabetes mellitus without complications: Secondary | ICD-10-CM | POA: Diagnosis not present

## 2014-05-03 LAB — MICROALBUMIN / CREATININE URINE RATIO
Creatinine, Urine: 171 mg/dL
MICROALB UR: 21.9 mg/dL — AB (ref <2.0)
Microalb Creat Ratio: 128.1 mg/g — ABNORMAL HIGH (ref 0.0–30.0)

## 2014-05-05 DIAGNOSIS — E785 Hyperlipidemia, unspecified: Secondary | ICD-10-CM

## 2014-05-05 DIAGNOSIS — E1169 Type 2 diabetes mellitus with other specified complication: Secondary | ICD-10-CM | POA: Insufficient documentation

## 2014-05-05 MED ORDER — ATORVASTATIN CALCIUM 20 MG PO TABS: 20.0000 mg | ORAL_TABLET | Freq: Every day | ORAL | Status: AC

## 2014-05-05 NOTE — Addendum Note (Signed)
Addended by: Boykin Nearing on: 05/05/2014 04:21 PM   Modules accepted: Orders

## 2014-05-05 NOTE — Assessment & Plan Note (Signed)
A; HLD with DM2 P: lipitor 20 mg daily

## 2014-05-18 ENCOUNTER — Telehealth: Payer: Self-pay | Admitting: Family Medicine

## 2014-05-18 NOTE — Telephone Encounter (Signed)
[name], please call patinet. I recommend f/u visit preferably this week.  If he cannot tolerate

## 2014-05-18 NOTE — Telephone Encounter (Signed)
Patient is calling stating that losartan (COZAAR) 100 MG tablet is making him sick. He states that he has been dizzy and has been having stomach cramps as well as vomiting. Please f/u with pt.

## 2014-05-18 NOTE — Telephone Encounter (Signed)
Expand All Collapse All   Junious Dresser, please call patinet.  I recommend f/u visit preferably this week.  If he cannot tolerate the ARB, I recommend a switch to a thiazide diuretic Avoid CCB due to obesity

## 2014-05-19 NOTE — Telephone Encounter (Signed)
Unable to contact Pt at both number listed

## 2014-05-24 DIAGNOSIS — E662 Morbid (severe) obesity with alveolar hypoventilation: Secondary | ICD-10-CM | POA: Diagnosis not present

## 2014-05-24 DIAGNOSIS — G4733 Obstructive sleep apnea (adult) (pediatric): Secondary | ICD-10-CM | POA: Diagnosis not present

## 2014-05-24 DIAGNOSIS — Z930 Tracheostomy status: Secondary | ICD-10-CM | POA: Diagnosis not present

## 2014-06-08 ENCOUNTER — Telehealth: Payer: Self-pay | Admitting: Family Medicine

## 2014-06-08 ENCOUNTER — Encounter (HOSPITAL_BASED_OUTPATIENT_CLINIC_OR_DEPARTMENT_OTHER): Payer: Medicare Other

## 2014-06-08 NOTE — Telephone Encounter (Signed)
Patient called to request to speak to his PCP, patient states that he was not advised about his appointment with the Wound Care clinic. Please f/u with pt.

## 2014-06-10 ENCOUNTER — Ambulatory Visit: Payer: Medicare Other | Admitting: *Deleted

## 2014-06-13 NOTE — Telephone Encounter (Signed)
Pt stated was not aware of appointment with Wound Care. Advised to call [facility] to schedule appointment if any problems with referral please call back

## 2014-06-14 ENCOUNTER — Telehealth: Payer: Self-pay | Admitting: Family Medicine
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
## 2014-06-14 NOTE — Telephone Encounter (Signed)
Pam from [facility] called to speak to his PCP about the patient, she stated that he was very upset about not being informed of his appointment. Please f/u

## 2014-06-15 ENCOUNTER — Ambulatory Visit (INDEPENDENT_AMBULATORY_CARE_PROVIDER_SITE_OTHER): Payer: Medicare Other | Admitting: Cardiology

## 2014-06-15 ENCOUNTER — Encounter: Payer: Self-pay | Admitting: Cardiology

## 2014-06-15 VITALS — BP 144/72 | HR 91 | Ht 67.5 in | Wt 399.0 lb

## 2014-06-15 DIAGNOSIS — I10 Essential (primary) hypertension: Secondary | ICD-10-CM

## 2014-06-15 DIAGNOSIS — I517 Cardiomegaly: Secondary | ICD-10-CM

## 2014-06-15 DIAGNOSIS — Z930 Tracheostomy status: Secondary | ICD-10-CM

## 2014-06-15 DIAGNOSIS — E662 Morbid (severe) obesity with alveolar hypoventilation: Secondary | ICD-10-CM

## 2014-06-15 DIAGNOSIS — I5033 Acute on chronic diastolic (congestive) heart failure: Secondary | ICD-10-CM | POA: Diagnosis not present

## 2014-06-15 MED ORDER — FUROSEMIDE 40 MG PO TABS: 40.0000 mg | ORAL_TABLET | Freq: Every day | ORAL | Status: DC

## 2014-06-15 NOTE — Patient Instructions (Signed)
Medication Instructions:   START LASIX 40 MG ONCE DAILY    Labwork:  PRIOR TO YOUR 2 MONTH FOLLOW-UP APPOINTMENT WITH DR NELSON---CHECK A BMET    Testing/Procedures:  Your physician has requested that you have an echocardiogram. Echocardiography is a painless test that uses sound waves to create images of your heart. It provides your doctor with information about the size and shape of your heart and how well your heart's chambers and valves are working. This procedure takes approximately one hour. There are no restrictions for this procedure.    Follow-Up:  2 MONTHS WITH DR NELSON----OK TO USE August 04, 2014 IN THE MORNING SLOT

## 2014-06-15 NOTE — Progress Notes (Signed)
Patient ID: [name], male   DOB: 12-Nov-1965, 49 y.o.   MRN: [id number]      Cardiology Office Note   Date:  06/15/2014   ID:  [name], DOB [birth year], MRN [id number]  PCP:  Minerva Ends, MD  Cardiologist:  Dorothy Spark, MD   Chief complain: DOE   History of Present Illness: [name] is a 49 y.o. male who presents for evaluation of SOB. The patient is morbidly obese (BMI 62), with obesity hypoventilation syndrome, with tracheostomy since 2013, hypertension, OSA, CHF (no echocardiogram in our system), COPD. The patient is severely deconditioned, sleeps in sitting position, otherwise wouldn't be able to breath. He can walk minimal distances. He denies any chest pain, palpitations, or syncope. No claudications.   Past Medical History  Diagnosis Date  . Ventilator dependent   . Sleep apnea Dx Oct 2011  . Hypertension Dx Oct 2012  . CHF (congestive heart failure) Dx Oct 2012  . COPD (chronic obstructive pulmonary disease) Dx 2013  . Diabetes mellitus without complication Dx Nov 7416   Past Surgical History  Procedure Laterality Date  . Wound debridement  12/21/2010    Procedure: DEBRIDEMENT WOUND;  Surgeon: Harl Bowie, MD;  Location: [facility];  Service: General;  Laterality: N/A;  Sacral  . Tracheostomy tube placement     Current Outpatient Prescriptions  Medication Sig Dispense Refill  . ACCU-CHEK FASTCLIX LANCETS MISC 1 each by Does not apply route 3 (three) times daily. E11.9 102 each 11  . albuterol (PROVENTIL) (2.5 MG/3ML) 0.083% nebulizer solution Take 2.5 mg by nebulization every 6 (six) hours as needed for wheezing or shortness of breath.    Marland Kitchen alum & mag hydroxide-simeth (MAALOX/MYLANTA) 200-200-20 MG/5ML suspension Take 30 mLs by mouth every 6 (six) hours as needed for indigestion or heartburn (dyspepsia). 355 mL 0  . antiseptic oral rinse (CPC / CETYLPYRIDINIUM CHLORIDE 0.05%) 0.05 % LIQD solution 7 mLs by Mouth Rinse route 2 times daily at 12  noon and 4 pm. 44 mL 0  . atorvastatin (LIPITOR) 20 MG tablet Take 1 tablet (20 mg total) by mouth daily. 30 tablet 11  . benzonatate (TESSALON) 100 MG capsule Take 1 capsule (100 mg total) by mouth every 8 (eight) hours. 21 capsule 0  . Blood Glucose Monitoring Suppl (ACCU-CHEK NANO SMARTVIEW) W/DEVICE KIT 1 Device by Does not apply route as needed. E 11.9 1 kit 0  . glucose blood (ACCU-CHEK SMARTVIEW) test strip 1 each by Other route 3 (three) times daily. E11.9 100 each 11  . losartan (COZAAR) 100 MG tablet Take 1 tablet (100 mg total) by mouth daily. 90 tablet 3  . metFORMIN (GLUCOPHAGE) 1000 MG tablet Take 1 tablet (1,000 mg total) by mouth 2 (two) times daily with a meal. 180 tablet 3  . furosemide (LASIX) 40 MG tablet Take 1 tablet (40 mg total) by mouth daily. 90 tablet 3   No current facility-administered medications for this visit.   Allergies:   Tramadol and Ibuprofen   Social History:  The patient  reports that he has been smoking Cigarettes.  He does not have any smokeless tobacco history on file. He reports that he uses illicit drugs (Marijuana). He reports that he does not drink alcohol.   Family History:  The patient's family history includes Cancer in his brother; Chronic Renal Failure in his sister.   ROS:  Please see the history of present illness.   Otherwise, review of systems are  positive for none.   All other systems are reviewed and negative.   PHYSICAL EXAM: VS:  BP 144/72 mmHg  Pulse 91  Ht 5' 7.5" (1.715 m)  Wt 399 lb (180.985 kg)  BMI 61.53 kg/m2 , BMI Body mass index is 61.53 kg/(m^2). GEN: morbidly obese, cought and gasps for breath while talking HEENT: tracheostomy in place Neck: no JVD, carotid bruits, or masses Cardiac: RRR; no murmurs, rubs, or gallops, lymphedema  Respiratory:  clear to auscultation bilaterally, normal work of breathing GI: soft, nontender, nondistended, + BS MS: no deformity or atrophy Skin: warm and dry, no rash Neuro:  Strength  and sensation are intact Psych: euthymic mood, full affect  EKG:  EKG is ordered today. The ekg ordered today demonstrates NSR, Right superior axis deviation, low voltage ECG  Recent Labs: 11/27/2013: ALT 20; Pro B Natriuretic peptide (BNP) 41.6 11/30/2013: BUN 22; Creatinine 0.83; Hemoglobin 14.5; Platelets 297; Potassium 5.3; Sodium 136*   Lipid Panel    Component Value Date/Time   CHOL 187 05/02/2014 1115   TRIG 106 05/02/2014 1115   HDL 36* 05/02/2014 1115   CHOLHDL 5.2 05/02/2014 1115   VLDL 21 05/02/2014 1115   LDLCALC 130* 05/02/2014 1115   Wt Readings from Last 3 Encounters:  06/15/14 399 lb (180.985 kg)  05/02/14 400 lb (181.439 kg)  11/28/13 401 lb 6.4 oz (182.074 kg)      ASSESSMENT AND PLAN:  49 year old male   1. DOE - multifactorial, chronic tracheostomy, obesity hypoventilation syndrome, but appears olume overloaded as well.  We will oredr echocardiogram to evaluate for systolic and diastolic function, for now we will start lasix 40 mg po daily.  2. Hypertension - mildly elevated, starting lasix 40 mg po daily.  3. Obesity - the patient started nutrition class and has lost 5 lbs so far.     Labs/ tests ordered today include:   Orders Placed This Encounter  Procedures  . Basic Metabolic Panel (BMET)  . EKG 12-Lead  . Echocardiogram   Disposition:   Follow up in 2 months.  Signed, Dorothy Spark, MD  06/15/2014 4:50 PM    [facility] [address], [place], [place]  [address] Phone: [phone]; Fax: [phone]

## 2014-06-16 ENCOUNTER — Telehealth: Payer: Self-pay | Admitting: *Deleted

## 2014-06-16 DIAGNOSIS — L89153 Pressure ulcer of sacral region, stage 3: Secondary | ICD-10-CM

## 2014-06-16 NOTE — Telephone Encounter (Signed)
Patient called in to say he was in extreme pain from his "bed sores"  I looked in Epic for a referral to the wound clinic and Nora's notes said he had an appointment on 06/08/14.  Patient said that nobody from this office had let him know he had an appointment and he had called the wound clinic and they could not see him until next month.  Patient states his pain is 10/10 and he has nothing for pain and would like to know what he should do.

## 2014-06-16 NOTE — Telephone Encounter (Signed)
Pt in severe pain and would requesting to speak to PCP/clinician asap.  Please f/u with pt.

## 2014-06-17 ENCOUNTER — Encounter: Payer: Self-pay | Admitting: *Deleted

## 2014-06-17 ENCOUNTER — Encounter: Payer: Medicare Other | Attending: Family Medicine | Admitting: *Deleted

## 2014-06-17 VITALS — Ht 67.0 in | Wt 398.8 lb

## 2014-06-17 DIAGNOSIS — E119 Type 2 diabetes mellitus without complications: Secondary | ICD-10-CM | POA: Insufficient documentation

## 2014-06-17 DIAGNOSIS — Z713 Dietary counseling and surveillance: Secondary | ICD-10-CM | POA: Diagnosis not present

## 2014-06-17 MED ORDER — ACETAMINOPHEN 500 MG PO TABS: 1000.0000 mg | ORAL_TABLET | Freq: Three times a day (TID) | ORAL | Status: DC | PRN

## 2014-06-17 NOTE — Progress Notes (Signed)
Diabetes Self-Management Education  Visit Type:  Initial  Appt. Start Time: 0830 Appt. End Time: 1000  06/17/2014  [name], identified by name and date of birth, is a 49 y.o. male with a diagnosis of Diabetes: Type 2.  Other people present during visit:  Patient   ASSESSMENT  Height 5\' 7"  (1.702 m), weight 398 lb 12.8 oz (180.894 kg). Body mass index is 62.45 kg/(m^2).  Initial Visit Information:  Are you currently following a meal plan?: Yes, within what I can afford to buy What type of meal plan do you follow?: healthy habits class at church, learnimg more about healthy eating Are you taking your medications as prescribed?: No Are you checking your feet?: Yes How many days per week are you checking your feet?: 4   What is the last grade level you completed in school?: some college  Psychosocial:     Patient Belief/Attitude about Diabetes: Motivated to manage diabetes Self-care barriers: Lack of material resources Self-management support: Doctor's office Other persons present: Patient Patient Concerns: Nutrition/Meal planning Preferred Learning Style: Auditory, Visual, Hands on Learning Readiness: Ready  Complications:   Last HgB A1C per patient/outside source: 6.9 % How often do you check your blood sugar?: 1-2 times/day Fasting Blood glucose range (mg/dL): 70-129 Number of hypoglycemic episodes per month: 0 Have you had a dilated eye exam in the past 12 months?: Yes Have you had a dental exam in the past 12 months?: No  Diet Intake:     Exercise:  Exercise: ADL's (Limited movement with morbid obesity and sacral bed sores.)  Individualized Plan for Diabetes Self-Management Training:   Learning Objective:  Patient will have a greater understanding of diabetes self-management.  Patient education plan per assessed needs and concerns is to attend individual sessions for     Education Topics Reviewed with Patient Today:  Definition of diabetes, type 1  and 2, and the diagnosis of diabetes       Identified appropriate SMBG and/or A1C goals.            PATIENTS GOALS/Plan (Developed by the patient):  Nutrition: Other (comment) (Due to food insecurity and limited income, we did not address meal planning today. Plan to at next visit.) Monitoring : test my blood glucose as discussed (note x per day with comment)  Plan:   Patient Instructions  Plan: We discussed what your BG target ranges are pre and post meal and how they relate to your A1c goals for good control of your diabetes Continue checking your BG as able daily. We will discuss other aspects of Diabetes management at future visits   Expected Outcomes:  Demonstrated interest in learning. Expect positive outcomes  Education material provided: Living Well with Diabetes and A1C conversion sheet  If problems or questions, patient to contact team via:  Phone and Email  Future DSME appointment: 4-6 wks

## 2014-06-17 NOTE — Telephone Encounter (Signed)
Extra strength tylenol sent in for pain.  If pain is that severe patient should schedule f/u here with me or come in to see walk-in provider or go to ED for evaluation in that order, based on availability.

## 2014-06-17 NOTE — Patient Instructions (Signed)
Plan: We discussed what your BG target ranges are pre and post meal and how they relate to your A1c goals for good control of your diabetes Continue checking your BG as able daily. We will discuss other aspects of Diabetes management at future visits

## 2014-06-20 ENCOUNTER — Encounter (HOSPITAL_COMMUNITY): Payer: Self-pay | Admitting: Emergency Medicine

## 2014-06-20 ENCOUNTER — Emergency Department (INDEPENDENT_AMBULATORY_CARE_PROVIDER_SITE_OTHER)
Admission: EM | Admit: 2014-06-20 | Discharge: 2014-06-20 | Disposition: A | Discharge status: Home / Self Care | Payer: Medicare Other | Source: Home / Self Care | Attending: Family Medicine | Admitting: Family Medicine

## 2014-06-20 DIAGNOSIS — L8990 Pressure ulcer of unspecified site, unspecified stage: Secondary | ICD-10-CM | POA: Diagnosis not present

## 2014-06-20 MED ORDER — HYDROCODONE-ACETAMINOPHEN 5-325 MG PO TABS: 1.0000 | ORAL_TABLET | Freq: Four times a day (QID) | ORAL | Status: DC | PRN

## 2014-06-20 NOTE — Discharge Instructions (Signed)
Thank you for coming in today. Keep the wound covered with ointment.  Return as needed.  Pressure Ulcer A pressure ulcer is a sore that has formed from the breakdown of skin and exposure of deeper layers of tissue. It develops in areas of the body where there is unrelieved pressure. Pressure ulcers are usually found over a bony area, such as the shoulder blades, spine, lower back, hips, knees, ankles, and heels. Pressure ulcers vary in severity. Your health care provider may determine the severity (stage) of your pressure ulcer. The stages include:  Stage I--The skin is red, and when the skin is pressed, it stays red.  Stage II--The top layer of skin is gone, and there is a shallow, pink ulcer.  Stage III--The ulcer becomes deeper, and it is more difficult to see the whole wound. Also, there may be yellow or brown parts, as well as pink and red parts.  Stage IV--The ulcer may be deep and red, pink, brown, white, or yellow. Bone or muscle may be seen.  Unstageable pressure ulcer--The ulcer is covered almost completely with black, brown, or yellow tissue. It is not known how deep the ulcer is or what stage it is until this covering comes off.  Suspected deep tissue injury--A person's skin can be injured from pressure or pulling on the skin when his or her position is changed. The skin appears purple or maroon. There may not be an opening in the skin, but there could be a blood-filled blister. This deep tissue injury is often difficult to see in people with darker skin tones. The site may open and become deeper in time. However, early interventions will help the area heal and may prevent the area from opening. CAUSES  Pressure ulcers are caused by pressure against the skin that limits the flow of blood to the skin and nearby tissues. There are many risk factors that can lead to pressure sores. RISK FACTORS  Decreased ability to move.  Decreased ability to feel pain or discomfort.  Excessive  skin moisture from urine, stool, sweat, or secretions.  Poor nutrition.  Dehydration.  Tobacco, drug, or alcohol abuse.  Having someone pull on bedsheets that are under you, such as when health care workers are changing your position in a hospital bed.  Obesity.  Increased adult age.  Hospitalization in a critical care unit for longer than 4 days with use of medical devices.  Prolonged use of medical devices.  Critical illness.  Anemia.  Traumatic brain injury.  Spinal cord injury.  Stroke.  Diabetes.  Poor blood glucose control.  Low blood pressure (hypotension).  Low oxygen levels.  Medicines that reduce blood flow.  Infection. DIAGNOSIS  Your health care provider will diagnose your pressure ulcer based on its appearance. The health care provider may determine the stage of your pressure ulcer as well. Tests may be done to check for infection, to assess your circulation, or to check for other diseases, such as diabetes. TREATMENT  Treatment of your pressure ulcer begins with determining what stage the ulcer is in. Your treatment team may include your health care provider, a wound care specialist, a nutritionist, a physical therapist, and a Psychologist, sport and exercise. Possible treatments may include:   Moving or repositioning every 1-2 hours.  Using beds or mattresses to shift your body weight and pressure points frequently.  Improving your diet.  Cleaning and bandaging (dressing) the open wound.  Giving antibiotic medicines.  Removing damaged tissue.  Surgery and sometimes skin grafts. HOME CARE  INSTRUCTIONS  If you were hospitalized, follow the care plan that was started in the hospital.  Avoid staying in the same position for more than 2 hours. Use padding, devices, or mattresses to cushion your pressure points as directed by your health care provider.  Eat a well-balanced diet. Take nutritional supplements and vitamins as directed by your health care provider.  Keep  all follow-up appointments.  Only take over-the-counter or prescription medicines for pain, fever, or discomfort as directed by your health care provider. SEEK MEDICAL CARE IF:   Your pressure ulcer is not improving.  You do not know how to care for your pressure ulcer.  You notice other areas of redness on your skin.  You have a fever. SEEK IMMEDIATE MEDICAL CARE IF:   You have increasing redness, swelling, or pain in your pressure ulcer.  You notice pus coming from your pressure ulcer.  You notice a bad smell coming from the wound or dressing.  Your pressure ulcer opens up again. Document Released: 01/21/2005 Document Revised: 01/26/2013 Document Reviewed: 09/28/2012 [facility] Patient Information 2015 [place], [place]. This information is not intended to replace advice given to you by your health care provider. Make sure you discuss any questions you have with your health care provider.

## 2014-06-20 NOTE — ED Provider Notes (Signed)
[name] is a 49 y.o. male who presents to Urgent Care today for Pressure sore on buttocks. Patient has a chronic decubitus pressure ulcer. He missed his wound care appointment last week because he did not know about it. He notes the wound on his sacrum seems to be slightly worse than usual. It is mildly painful. No fevers or chills nausea vomiting or diarrhea.   Past Medical History  Diagnosis Date  . Ventilator dependent   . Sleep apnea Dx Oct 2011  . Hypertension Dx Oct 2012  . CHF (congestive heart failure) Dx Oct 2012  . COPD (chronic obstructive pulmonary disease) Dx 2013  . Diabetes mellitus without complication Dx Nov 0865   Past Surgical History  Procedure Laterality Date  . Wound debridement  12/21/2010    Procedure: DEBRIDEMENT WOUND;  Surgeon: Harl Bowie, MD;  Location: [facility];  Service: General;  Laterality: N/A;  Sacral  . Tracheostomy tube placement     History  Substance Use Topics  . Smoking status: Current Every Day Smoker    Types: Cigarettes  . Smokeless tobacco: Not on file  . Alcohol Use: No   ROS as above Medications: No current facility-administered medications for this encounter.   Current Outpatient Prescriptions  Medication Sig Dispense Refill  . ACCU-CHEK FASTCLIX LANCETS MISC 1 each by Does not apply route 3 (three) times daily. E11.9 102 each 11  . acetaminophen (TYLENOL) 500 MG tablet Take 2 tablets (1,000 mg total) by mouth every 8 (eight) hours as needed. 30 tablet 1  . albuterol (PROVENTIL) (2.5 MG/3ML) 0.083% nebulizer solution Take 2.5 mg by nebulization every 6 (six) hours as needed for wheezing or shortness of breath.    Marland Kitchen alum & mag hydroxide-simeth (MAALOX/MYLANTA) 200-200-20 MG/5ML suspension Take 30 mLs by mouth every 6 (six) hours as needed for indigestion or heartburn (dyspepsia). (Patient not taking: Reported on 06/17/2014) 355 mL 0  . antiseptic oral rinse (CPC / CETYLPYRIDINIUM CHLORIDE 0.05%) 0.05 % LIQD solution 7 mLs by  Mouth Rinse route 2 times daily at 12 noon and 4 pm. (Patient not taking: Reported on 06/17/2014) 44 mL 0  . atorvastatin (LIPITOR) 20 MG tablet Take 1 tablet (20 mg total) by mouth daily. (Patient not taking: Reported on 06/17/2014) 30 tablet 11  . benzonatate (TESSALON) 100 MG capsule Take 1 capsule (100 mg total) by mouth every 8 (eight) hours. 21 capsule 0  . Blood Glucose Monitoring Suppl (ACCU-CHEK NANO SMARTVIEW) W/DEVICE KIT 1 Device by Does not apply route as needed. E 11.9 1 kit 0  . furosemide (LASIX) 40 MG tablet Take 1 tablet (40 mg total) by mouth daily. 90 tablet 3  . glucose blood (ACCU-CHEK SMARTVIEW) test strip 1 each by Other route 3 (three) times daily. E11.9 100 each 11  . HYDROcodone-acetaminophen (NORCO/VICODIN) 5-325 MG per tablet Take 1 tablet by mouth every 6 (six) hours as needed. 9 tablet 0  . losartan (COZAAR) 100 MG tablet Take 1 tablet (100 mg total) by mouth daily. 90 tablet 3  . metFORMIN (GLUCOPHAGE) 1000 MG tablet Take 1 tablet (1,000 mg total) by mouth 2 (two) times daily with a meal. 180 tablet 3   Allergies  Allergen Reactions  . Tramadol Anaphylaxis and Swelling    Tongue   . Ibuprofen Other (See Comments)    Bloody Stool      Exam:  BP 131/87 mmHg  Pulse 87  Temp(Src) 98.7 F (37.1 C) (Oral)  Resp 24  SpO2 97%  Gen: Well NAD morbidly obese HEENT: EOMI,  MMM tracheostomy is well-appearing Lungs: Normal work of breathing. CTABL Heart: RRR no MRG Abd: NABS, Soft. Nondistended, Nontender Exts: Brisk capillary refill, warm and well perfused.  Skin: cratered scar decubitus ulcer present. Appears to be mature no discharge or significant erythema. Mildly tender.  No results found for this or any previous visit (from the past 24 hour(s)). No results found.  Assessment and Plan: 49 y.o. male with decubitus ulcer chronic well-appearing continue barrier ointment. Control mild pain with very limited amount of Norco follow-up with PCP  Discussed warning  signs or symptoms. Please see discharge instructions. Patient expresses understanding.     Gregor Hams, MD 06/20/14 [id number]

## 2014-06-20 NOTE — ED Notes (Signed)
Pt states that he missed his wound care appointment for a decubitus ulcer on his bottom that they are following

## 2014-06-22 ENCOUNTER — Other Ambulatory Visit: Payer: Self-pay

## 2014-06-22 ENCOUNTER — Ambulatory Visit (HOSPITAL_COMMUNITY): Payer: Medicare Other | Attending: Cardiology

## 2014-06-22 DIAGNOSIS — I10 Essential (primary) hypertension: Secondary | ICD-10-CM | POA: Insufficient documentation

## 2014-06-22 DIAGNOSIS — I517 Cardiomegaly: Secondary | ICD-10-CM | POA: Diagnosis not present

## 2014-06-22 DIAGNOSIS — E119 Type 2 diabetes mellitus without complications: Secondary | ICD-10-CM | POA: Diagnosis not present

## 2014-06-23 ENCOUNTER — Telehealth: Payer: Self-pay | Admitting: Clinical

## 2014-06-23 ENCOUNTER — Telehealth: Payer: Self-pay | Admitting: Family Medicine

## 2014-06-23 NOTE — Telephone Encounter (Signed)
See above

## 2014-06-23 NOTE — Telephone Encounter (Signed)
Pt called to return call from [facility]. 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 He says that yes, he does need help with food resources, and that yes, he does have the [place] of meals in [place] area. Pt does not have [place] [facility] food pantries. Pt states he was turned down for food stamps. Pt had to get off the phone quickly, and call ended.

## 2014-06-23 NOTE — Telephone Encounter (Signed)
Pt returning call regarding referral °

## 2014-07-05 ENCOUNTER — Telehealth: Payer: Self-pay | Admitting: *Deleted

## 2014-07-05 NOTE — Telephone Encounter (Signed)
Patient called in after being seen at Urgent Care 5/16 for his decubitus ulcers.  He as given Norco and told to follow up with PCP.  Patient called in today to say he was in pain.  Advised patient that he needs to make a f/u appointment to see Dr. Adrian Blackwater.  Transferred to front desk, [place] to set up appointment.

## 2014-07-11 NOTE — Telephone Encounter (Signed)
Called patient. Tylenol #3 for chronic pain will be available for tomorrow. Patient has an appt tomorrow and will pick up rx.

## 2014-07-12 ENCOUNTER — Ambulatory Visit: Payer: Medicare Other | Attending: Family Medicine | Admitting: Family Medicine

## 2014-07-12 ENCOUNTER — Encounter: Payer: Self-pay | Admitting: Family Medicine

## 2014-07-12 VITALS — BP 146/76 | HR 86 | Temp 98.9°F | Resp 20 | Ht 67.0 in | Wt >= 6400 oz

## 2014-07-12 DIAGNOSIS — Z6841 Body Mass Index (BMI) 40.0 and over, adult: Secondary | ICD-10-CM | POA: Diagnosis not present

## 2014-07-12 DIAGNOSIS — E119 Type 2 diabetes mellitus without complications: Secondary | ICD-10-CM | POA: Diagnosis not present

## 2014-07-12 DIAGNOSIS — L89153 Pressure ulcer of sacral region, stage 3: Secondary | ICD-10-CM

## 2014-07-12 DIAGNOSIS — L89159 Pressure ulcer of sacral region, unspecified stage: Secondary | ICD-10-CM | POA: Insufficient documentation

## 2014-07-12 DIAGNOSIS — E11621 Type 2 diabetes mellitus with foot ulcer: Secondary | ICD-10-CM | POA: Diagnosis not present

## 2014-07-12 DIAGNOSIS — B351 Tinea unguium: Secondary | ICD-10-CM | POA: Diagnosis not present

## 2014-07-12 DIAGNOSIS — F17200 Nicotine dependence, unspecified, uncomplicated: Secondary | ICD-10-CM | POA: Diagnosis not present

## 2014-07-12 LAB — GLUCOSE, POCT (MANUAL RESULT ENTRY): POC GLUCOSE: 98 mg/dL (ref 70–99)

## 2014-07-12 LAB — POCT GLYCOSYLATED HEMOGLOBIN (HGB A1C): Hemoglobin A1C: 7.5

## 2014-07-12 MED ORDER — ACETAMINOPHEN-CODEINE #3 300-30 MG PO TABS: 1.0000 | ORAL_TABLET | Freq: Two times a day (BID) | ORAL | Status: DC | PRN

## 2014-07-12 NOTE — Progress Notes (Signed)
   Subjective:    Patient ID: [name], male    DOB: 05-17-1965, 49 y.o.   MRN: [id number] CC: pain management for sacral decub, diabetes  HPI  1. Sacral decub: chronic pain. No fever or chills. Went to UC and got a few vicodin. Now out. Amenable to tylenol #3. Has wound care visit next week.  2. CHRONIC DIABETES  Disease Monitoring  Blood Sugar Ranges: 90-160  Polyuria: no   Visual problems: no   Medication Compliance: yes  Medication Side Effects  Hypoglycemia: no   Preventitive Health Care  Eye Exam: due   Foot Exam: done today    Soc Hx: current smoker  Review of Systems  Constitutional: Negative for fever and chills.  Endocrine: Negative for polydipsia and polyphagia.  Skin: Positive for wound.       Objective:   Physical Exam BP 146/76 mmHg  Pulse 86  Temp(Src) 98.9 F (37.2 C) (Oral)  Resp 20  Ht 5\' 7"  (1.702 m)  Wt 403 lb (182.8 kg)  BMI 63.10 kg/m2  SpO2 94% General appearance: alert, cooperative, no distress and morbidly obese Extremities: no edema, obese, diabetic foot exam done  Thickened and yellow great and 5th toe L foot    Lab Results  Component Value Date   HGBA1C 7.50 07/12/2014   CBG  98    Assessment & Plan:

## 2014-07-12 NOTE — Patient Instructions (Addendum)
Mr. Karbowski,   Thank you for coming in today.  1. Decubitus ulcer: Tylenol #3 for pain control Keep appt with wound care  2. Diabetes: Well controlled Continue metformin Low carb diet  3. Toenail fungus L foot: CMP at next visit Plan for lamisil   F/u in 3 months for diabetes   Dr. Adrian Blackwater

## 2014-07-12 NOTE — Assessment & Plan Note (Signed)
Decubitus ulcer: Tylenol #3 for pain control Keep appt with wound care

## 2014-07-12 NOTE — Progress Notes (Signed)
F/U Bed sore  Pt unsure of medication intake

## 2014-07-12 NOTE — Assessment & Plan Note (Signed)
.   Diabetes: Well controlled Continue metformin Low carb diet

## 2014-07-12 NOTE — Assessment & Plan Note (Signed)
Toenail fungus L foot: CMP at next visit Plan for lamisil

## 2014-07-18 ENCOUNTER — Encounter (HOSPITAL_BASED_OUTPATIENT_CLINIC_OR_DEPARTMENT_OTHER): Payer: Medicare Other | Attending: Plastic Surgery

## 2014-07-18 DIAGNOSIS — Z6841 Body Mass Index (BMI) 40.0 and over, adult: Secondary | ICD-10-CM | POA: Diagnosis not present

## 2014-07-18 DIAGNOSIS — F17210 Nicotine dependence, cigarettes, uncomplicated: Secondary | ICD-10-CM | POA: Diagnosis not present

## 2014-07-18 DIAGNOSIS — Z872 Personal history of diseases of the skin and subcutaneous tissue: Secondary | ICD-10-CM | POA: Diagnosis not present

## 2014-07-18 DIAGNOSIS — E119 Type 2 diabetes mellitus without complications: Secondary | ICD-10-CM | POA: Insufficient documentation

## 2014-07-18 DIAGNOSIS — G4730 Sleep apnea, unspecified: Secondary | ICD-10-CM | POA: Diagnosis not present

## 2014-07-18 DIAGNOSIS — I517 Cardiomegaly: Secondary | ICD-10-CM | POA: Insufficient documentation

## 2014-07-18 DIAGNOSIS — E785 Hyperlipidemia, unspecified: Secondary | ICD-10-CM | POA: Diagnosis not present

## 2014-07-18 DIAGNOSIS — Z930 Tracheostomy status: Secondary | ICD-10-CM | POA: Insufficient documentation

## 2014-07-18 DIAGNOSIS — I509 Heart failure, unspecified: Secondary | ICD-10-CM | POA: Diagnosis not present

## 2014-07-18 DIAGNOSIS — Z79899 Other long term (current) drug therapy: Secondary | ICD-10-CM | POA: Diagnosis not present

## 2014-07-22 ENCOUNTER — Telehealth: Payer: Self-pay | Admitting: Family Medicine

## 2014-07-22 NOTE — Telephone Encounter (Signed)
Pt called requesting to speak to nurse, regarding  acetaminophen-codeine (TYLENOL #3) 300-30 MG per tablet is causing blood in stool and patient is concerned. Please f/u with patient

## 2014-07-26 ENCOUNTER — Telehealth: Payer: Self-pay | Admitting: Clinical

## 2014-07-26 NOTE — Telephone Encounter (Signed)
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 He is welcome to check at his next visit at [facility] to see if more bus passes are available, but at this time, we have none available.

## 2014-07-29 ENCOUNTER — Encounter: Payer: Self-pay | Admitting: *Deleted

## 2014-07-29 ENCOUNTER — Encounter: Payer: Medicare Other | Attending: Family Medicine | Admitting: *Deleted

## 2014-07-29 VITALS — Ht 67.0 in | Wt >= 6400 oz

## 2014-07-29 DIAGNOSIS — E119 Type 2 diabetes mellitus without complications: Secondary | ICD-10-CM | POA: Insufficient documentation

## 2014-07-29 DIAGNOSIS — Z713 Dietary counseling and surveillance: Secondary | ICD-10-CM | POA: Diagnosis not present

## 2014-07-29 NOTE — Patient Instructions (Signed)
Plan:  Aim for 4 Carb Choices per meal (60 grams) +/- 1 either way  Aim for 0-2 Carbs per snack if hungry  Include protein in moderation with your meals and snacks Continue checking BG at alternate times per day  Continue taking medication Metformin as directed by MD

## 2014-07-29 NOTE — Progress Notes (Signed)
Diabetes Self-Management Education  Visit Type:  Follow-up  Appt. Start Time: 1145 Appt. End Time: 7341  07/29/2014  [name], identified by name and date of birth, is a 49 y.o. male with a diagnosis of Diabetes: Type 2.  Other people present during visit:  Patient   ASSESSMENT  Height 5\' 7"  (1.702 m), weight 402 lb 4.8 oz (182.482 kg). Body mass index is 62.99 kg/(m^2).    Subsequent Visit Information:  Since your last visit, have you continued or began the use of a meal plan?: Yes How many days a week are you following a meal plan?: 2 Since your last visit, have you continued or began to exercise on a consistent basis?: No Since your last visit have you continued or begun to take your medications as prescribed?: Yes Since your last visit have you experienced any weight changes?: Gain Weight Gain (lbs): 3 Since your last visit, are you checking your blood glucose at least once a day?: Yes  Psychosocial:     Patient Belief/Attitude about Diabetes: Defeat/Burnout Self-care barriers: Lack of transportation, Lack of material resources Self-management support: Doctor's office Other persons present: Patient Patient Concerns: Nutrition/Meal planning Preferred Learning Style: Hands on Learning Readiness: Change in progress  Complications:   Last HgB A1C per patient/outside source: 6.9 % How often do you check your blood sugar?: 1-2 times/day Fasting Blood glucose range (mg/dL): 70-129 Postprandial Blood glucose range (mg/dL): 70-129 Number of hypoglycemic episodes per month: 0 Have you had a dilated eye exam in the past 12 months?: Yes Have you had a dental exam in the past 12 months?: No  Diet Intake:     Exercise:  Exercise: ADL's   Individualized Plan for Diabetes Self-Management Training:   Learning Objective:  Patient will have a greater understanding of diabetes self-management.  Patient education plan per assessed needs and concerns is to attend  individual sessions for    Education Topics Reviewed with Patient Today:    Role of diet in the treatment of diabetes and the relationship between the three main macronutrients and blood glucose level, Carbohydrate counting                  PATIENTS GOALS/Plan (Developed by the patient):  Nutrition: Follow meal plan discussed, Other (comment) (Concentrate on buying less expensive starchy foods that can be used as carb choices, and spread them more evenly throughout the day.) Medications: take my medication as prescribed Monitoring : test blood glucose pre and post meals as discussed  Patient Self Evaluation of Goals - Patient rates self as meeting previously set goals:   Nutrition: 25 - 50% Physical Activity: < 25% Medications: 50 - 75 % Monitoring: 50 - 75 % Reducing Risk: 25 - 50% Health Coping: 25 - 50%   Plan:   Patient Instructions  Plan:  Aim for 4 Carb Choices per meal (60 grams) +/- 1 either way  Aim for 0-2 Carbs per snack if hungry  Include protein in moderation with your meals and snacks Continue checking BG at alternate times per day  Continue taking medication Metformin as directed by MD        Expected Outcomes:  Demonstrated interest in learning. Expect positive outcomes  Education material provided: Meal plan card and Carbohydrate counting sheet  If problems or questions, patient to contact team via:  Phone and Email  Future DSME appointment: - 4-6 wks

## 2014-08-04 ENCOUNTER — Other Ambulatory Visit (INDEPENDENT_AMBULATORY_CARE_PROVIDER_SITE_OTHER): Payer: Medicare Other

## 2014-08-04 ENCOUNTER — Ambulatory Visit (INDEPENDENT_AMBULATORY_CARE_PROVIDER_SITE_OTHER): Payer: Medicare Other | Admitting: Cardiology

## 2014-08-04 DIAGNOSIS — I5033 Acute on chronic diastolic (congestive) heart failure: Secondary | ICD-10-CM | POA: Diagnosis not present

## 2014-08-04 DIAGNOSIS — I517 Cardiomegaly: Secondary | ICD-10-CM

## 2014-08-04 DIAGNOSIS — I10 Essential (primary) hypertension: Secondary | ICD-10-CM | POA: Diagnosis not present

## 2014-08-04 DIAGNOSIS — E662 Morbid (severe) obesity with alveolar hypoventilation: Secondary | ICD-10-CM | POA: Diagnosis not present

## 2014-08-04 MED ORDER — ATENOLOL 25 MG PO TABS
25.0000 mg | ORAL_TABLET | Freq: Every day | ORAL | Status: DC
Start: 2014-08-04 — End: 2016-06-21

## 2014-08-04 MED ORDER — POTASSIUM CHLORIDE CRYS ER 20 MEQ PO TBCR: 20.0000 meq | EXTENDED_RELEASE_TABLET | Freq: Every day | ORAL | Status: AC

## 2014-08-04 MED ORDER — FUROSEMIDE 40 MG PO TABS: 40.0000 mg | ORAL_TABLET | Freq: Two times a day (BID) | ORAL | Status: DC

## 2014-08-04 NOTE — Addendum Note (Signed)
Addended by: Eulis Foster on: 08/04/2014 10:46 AM   Modules accepted: Orders

## 2014-08-04 NOTE — Patient Instructions (Signed)
Medication Instructions:   INCREASE YOUR LASIX TO 40 MG TWICE DAILY--TAKE ONE TAB IN THE MORNING AND ONE TAB AT 2 PM  START TAKING POTASSIUM CHLORIDE 20 mEq ONCE DAILY  START TAKING ATENOLOL 25 MG ONCE DAILY    Labwork:  BMET IN 4 MONTHS ---SCHEDULE THIS PRIOR TO YOUR 4 MONTH OFFICE VISIT WITH DR Meda Coffee     Follow-Up:  4 MONTHS WITH DR NELSON---PLEASE HAVE YOUR LAB APPOINTMENT SCHEDULED PRIOR TO THIS VISIT TO CHECK A BMET

## 2014-08-04 NOTE — Progress Notes (Signed)
Patient ID: [name], male   DOB: Jun 09, 1965, 49 y.o.   MRN: [id number]      Cardiology Office Note   Date:  08/04/2014   ID:  [name], DOB 05/30/1965, MRN [id number]  PCP:  Minerva Ends, MD  Cardiologist:  Dorothy Spark, MD   Chief complain: DOE   History of Present Illness: [name] is a 49 y.o. male who presents for evaluation of SOB. The patient is morbidly obese (BMI 62), with obesity hypoventilation syndrome, with tracheostomy since 2013, hypertension, OSA, CHF (no echocardiogram in our system), COPD. The patient is severely deconditioned, sleeps in sitting position, otherwise wouldn't be able to breath. He can walk minimal distances. He denies any chest pain, palpitations, or syncope. No claudications.   08/04/2014 - the patient is coming after 1 month, he underwent an echocardiogram that showed normal systolic and diastolic function.  No significant improvement with lasix, he in fact gained 6 lbs, he continues to eat very unhealthy diet - rahmen noodles and a lot of salt. Minimal activity. He is complaining about not being able to afford healthy diet.  Past Medical History  Diagnosis Date  . Ventilator dependent   . Sleep apnea Dx Oct 2011  . Hypertension Dx Oct 2012  . CHF (congestive heart failure) Dx Oct 2012  . COPD (chronic obstructive pulmonary disease) Dx 2013  . Diabetes mellitus without complication Dx Nov 3500   Past Surgical History  Procedure Laterality Date  . Wound debridement  12/21/2010    Procedure: DEBRIDEMENT WOUND;  Surgeon: Harl Bowie, MD;  Location: [facility];  Service: General;  Laterality: N/A;  Sacral  . Tracheostomy tube placement     Current Outpatient Prescriptions  Medication Sig Dispense Refill  . ACCU-CHEK FASTCLIX LANCETS MISC 1 each by Does not apply route 3 (three) times daily. E11.9 102 each 11  . acetaminophen-codeine (TYLENOL #3) 300-30 MG per tablet Take 1 tablet by mouth 2 (two) times daily as needed for  moderate pain. 60 tablet 2  . albuterol (PROVENTIL) (2.5 MG/3ML) 0.083% nebulizer solution Take 2.5 mg by nebulization every 6 (six) hours as needed for wheezing or shortness of breath.    Marland Kitchen alum & mag hydroxide-simeth (MAALOX/MYLANTA) 200-200-20 MG/5ML suspension Take 30 mLs by mouth every 6 (six) hours as needed for indigestion or heartburn (dyspepsia). (Patient not taking: Reported on 06/17/2014) 355 mL 0  . antiseptic oral rinse (CPC / CETYLPYRIDINIUM CHLORIDE 0.05%) 0.05 % LIQD solution 7 mLs by Mouth Rinse route 2 times daily at 12 noon and 4 pm. (Patient not taking: Reported on 06/17/2014) 44 mL 0  . atenolol (TENORMIN) 25 MG tablet Take 1 tablet (25 mg total) by mouth daily. 90 tablet 3  . atorvastatin (LIPITOR) 20 MG tablet Take 1 tablet (20 mg total) by mouth daily. 30 tablet 11  . Blood Glucose Monitoring Suppl (ACCU-CHEK NANO SMARTVIEW) W/DEVICE KIT 1 Device by Does not apply route as needed. E 11.9 1 kit 0  . furosemide (LASIX) 40 MG tablet Take 1 tablet (40 mg total) by mouth 2 (two) times daily. Take 1 tab in the morning and 1 tab at 2 pm 180 tablet 3  . glucose blood (ACCU-CHEK SMARTVIEW) test strip 1 each by Other route 3 (three) times daily. E11.9 100 each 11  . HYDROcodone-acetaminophen (NORCO/VICODIN) 5-325 MG per tablet     . losartan (COZAAR) 100 MG tablet Take 1 tablet (100 mg total) by mouth daily. 90 tablet 3  .  metFORMIN (GLUCOPHAGE) 1000 MG tablet Take 1 tablet (1,000 mg total) by mouth 2 (two) times daily with a meal. 180 tablet 3  . potassium chloride SA (K-DUR,KLOR-CON) 20 MEQ tablet Take 1 tablet (20 mEq total) by mouth daily. 90 tablet 3   No current facility-administered medications for this visit.   Allergies:   Tramadol and Ibuprofen   Social History:  The patient  reports that he has been smoking Cigarettes.  He does not have any smokeless tobacco history on file. He reports that he uses illicit drugs (Marijuana). He reports that he does not drink alcohol.    Family History:  The patient's family history includes Cancer in his brother; Chronic Renal Failure in his sister.   ROS:  Please see the history of present illness.   Otherwise, review of systems are positive for none.   All other systems are reviewed and negative.   PHYSICAL EXAM: VS: BP: 154/62 mmHg, Weight: 405 lbs, SpO2 95% , BMI There is no weight on file to calculate BMI. GEN: morbidly obese, cought and gasps for breath while talking HEENT: tracheostomy in place Neck: no JVD, carotid bruits, or masses Cardiac: RRR; no murmurs, rubs, or gallops, lymphedema  Respiratory:  clear to auscultation bilaterally, normal work of breathing GI: soft, nontender, nondistended, + BS MS: no deformity or atrophy Skin: warm and dry, no rash Neuro:  Strength and sensation are intact Psych: euthymic mood, full affect  EKG:  EKG is ordered today. The ekg ordered today demonstrates NSR, Right superior axis deviation, low voltage ECG  Recent Labs: 11/27/2013: ALT 20; Pro B Natriuretic peptide (BNP) 41.6 11/30/2013: BUN 22; Creatinine, Ser 0.83; Hemoglobin 14.5; Platelets 297; Potassium 5.3; Sodium 136*   Lipid Panel    Component Value Date/Time   CHOL 187 05/02/2014 1115   TRIG 106 05/02/2014 1115   HDL 36* 05/02/2014 1115   CHOLHDL 5.2 05/02/2014 1115   VLDL 21 05/02/2014 1115   LDLCALC 130* 05/02/2014 1115   Wt Readings from Last 3 Encounters:  07/29/14 402 lb 4.8 oz (182.482 kg)  07/12/14 403 lb (182.8 kg)  06/17/14 398 lb 12.8 oz (180.894 kg)    TTE: 06/2014 Left ventricle: The cavity size was normal. There was mild focal basal hypertrophy of the septum. Systolic function was normal. The estimated ejection fraction was in the range of 55% to 60%. Wall motion was normal; there were no regional wall motion abnormalities. Left ventricular diastolic function parameters were normal. - Left atrium: The atrium was mildly dilated. - Right ventricle: The cavity size was mildly  dilated. - Right atrium: The atrium was mildly dilated.   ASSESSMENT AND PLAN:  49 year old male   1. DOE - multifactorial, chronic tracheostomy, obesity hypoventilation syndrome, his systolic and diastolic function is normal, we will increase lasix to 40 mg po bid, however mainstay would be adherence to low salt diet. Add KCl 20 mEq daily.  2. Hypertension - elevated, add atenolol 25 mg po daily.  3. Obesity - the patient started nutrition class , no success yet   Follow up in 4 months with BMP ( he refused today.  Signed, Dorothy Spark, MD  08/04/2014 11:25 AM    [facility] [place], [place], [place]  [address] Phone: [phone]; Fax: [phone]

## 2014-08-15 ENCOUNTER — Encounter (HOSPITAL_BASED_OUTPATIENT_CLINIC_OR_DEPARTMENT_OTHER): Payer: Medicare Other | Attending: Plastic Surgery

## 2014-08-25 DIAGNOSIS — E662 Morbid (severe) obesity with alveolar hypoventilation: Secondary | ICD-10-CM | POA: Diagnosis not present

## 2014-08-25 DIAGNOSIS — Z930 Tracheostomy status: Secondary | ICD-10-CM | POA: Diagnosis not present

## 2014-08-25 DIAGNOSIS — Z430 Encounter for attention to tracheostomy: Secondary | ICD-10-CM | POA: Diagnosis not present

## 2014-08-26 ENCOUNTER — Encounter: Payer: Medicare Other | Attending: Family Medicine | Admitting: *Deleted

## 2014-08-26 VITALS — Ht 67.0 in | Wt >= 6400 oz

## 2014-08-26 DIAGNOSIS — E119 Type 2 diabetes mellitus without complications: Secondary | ICD-10-CM | POA: Diagnosis not present

## 2014-08-26 DIAGNOSIS — Z713 Dietary counseling and surveillance: Secondary | ICD-10-CM | POA: Insufficient documentation

## 2014-08-26 NOTE — Patient Instructions (Addendum)
Plan:  Continue to aim for 2-4 Carb Choices per meal (30-60 grams) +/- 1 either way  Aim for 0-2 Carbs per snack if hungry  Include protein in moderation with your meals and snacks Continue checking BG at alternate times per day  Continue taking medication Metformin as directed by MD Consider the Arm Chair Exercises we talked about today to do each day in addition to the trainer exercises you do twice a week

## 2014-09-02 ENCOUNTER — Encounter: Payer: Self-pay | Admitting: *Deleted

## 2014-09-02 NOTE — Progress Notes (Signed)
Diabetes Self-Management Education  Visit Type:  Follow-up  Appt. Start Time: 1130 Appt. End Time: 1200  09/02/2014  [name], identified by name and date of birth, is a 49 y.o. male with a diagnosis of Diabetes: Type 2.  Other people present during visit:  Patient   ASSESSMENT  Height 5\' 7"  (1.702 m), weight 402 lb (182.346 kg). Body mass index is 62.95 kg/(m^2).    Subsequent Visit Information:  Since your last visit, have you continued or began the use of a meal plan?: Yes (He tries to buy inexpensive carb choices with meat when able) How many days a week are you following a meal plan?: 2 Since your last visit, have you continued or began to exercise on a consistent basis?: Yes (he is working with a Clinical research associate from his church for 30 minutes twice a week. Also doing Arm Chair type exercises at home now) How many days per week are you exercising or participating in a physicial activity for more than 20 minutes?: 4 Since your last visit have you continued or begun to take your medications as prescribed?: Yes Since your last visit have you experienced any weight changes?: No change  Psychosocial:     Patient Belief/Attitude about Diabetes: Other (comment) (working towards motivation, money limitations still a major problem) Self-care barriers: Lack of material resources, Lack of transportation Self-management support: Doctor's office Other persons present: Patient Patient Concerns: Nutrition/Meal planning Learning Readiness: Change in progress  Complications:      Diet Intake:     Exercise:  Exercise: ADL's, Light (walking / raking leaves) (training with friend from his church and exercising at home more now) Light Exercise amount of time (min / week): 60   Individualized Plan for Diabetes Self-Management Training:   Learning Objective:  Patient will have a greater understanding of diabetes self-management.  Patient education plan per assessed needs and  concerns is to attend individual sessions for    Education Topics Reviewed with Patient Today:    Carbohydrate counting Role of exercise on diabetes management, blood pressure control and cardiac health., Helped patient identify appropriate exercises in relation to his/her diabetes, diabetes complications and other health issue.         Brainstormed with patient on coping mechanisms for social situations, getting support from significant others, dealing with feelings about diabetes      PATIENTS GOALS/Plan (Developed by the patient):  Nutrition: Follow meal plan discussed Physical Activity: Exercise 3-5 times per week Medications: take my medication as prescribed Monitoring : test blood glucose pre and post meals as discussed  Patient Self Evaluation of Goals - Patient rates self as meeting previously set goals:   Nutrition: 25 - 50% Physical Activity: 25 - 50% Medications: 50 - 75 % Monitoring: 50 - 75 % Reducing Risk: 25 - 50% Health Coping: 25 - 50%   Plan:   Patient Instructions  Plan:  Continue to aim for 2-4 Carb Choices per meal (30-60 grams) +/- 1 either way  Aim for 0-2 Carbs per snack if hungry  Include protein in moderation with your meals and snacks Continue checking BG at alternate times per day  Continue taking medication Metformin as directed by MD Consider the Arm Chair Exercises we talked about today to do each day in addition to the trainer exercises you do twice a week        Expected Outcomes:  Demonstrated interest in learning. Expect positive outcomes  Education material provided: Exercise handout  If problems or  questions, patient to contact team via:  Phone and Email  Future DSME appointment: - 4-6 wks

## 2014-10-25 DIAGNOSIS — E662 Morbid (severe) obesity with alveolar hypoventilation: Secondary | ICD-10-CM | POA: Diagnosis not present

## 2014-10-25 DIAGNOSIS — Z930 Tracheostomy status: Secondary | ICD-10-CM | POA: Diagnosis not present

## 2014-10-25 DIAGNOSIS — G4733 Obstructive sleep apnea (adult) (pediatric): Secondary | ICD-10-CM | POA: Diagnosis not present

## 2014-10-25 DIAGNOSIS — Z430 Encounter for attention to tracheostomy: Secondary | ICD-10-CM | POA: Diagnosis not present

## 2014-11-09 ENCOUNTER — Ambulatory Visit: Payer: Medicare Other | Attending: Family Medicine | Admitting: Family Medicine

## 2014-11-09 ENCOUNTER — Encounter: Payer: Self-pay | Admitting: Family Medicine

## 2014-11-09 VITALS — BP 127/74 | HR 86 | Temp 98.6°F | Resp 20 | Ht 67.0 in | Wt >= 6400 oz

## 2014-11-09 DIAGNOSIS — Z6841 Body Mass Index (BMI) 40.0 and over, adult: Secondary | ICD-10-CM | POA: Diagnosis not present

## 2014-11-09 DIAGNOSIS — L89153 Pressure ulcer of sacral region, stage 3: Secondary | ICD-10-CM | POA: Diagnosis not present

## 2014-11-09 DIAGNOSIS — L89159 Pressure ulcer of sacral region, unspecified stage: Secondary | ICD-10-CM | POA: Insufficient documentation

## 2014-11-09 DIAGNOSIS — Z794 Long term (current) use of insulin: Secondary | ICD-10-CM | POA: Diagnosis not present

## 2014-11-09 DIAGNOSIS — E119 Type 2 diabetes mellitus without complications: Secondary | ICD-10-CM | POA: Diagnosis not present

## 2014-11-09 DIAGNOSIS — Z7984 Long term (current) use of oral hypoglycemic drugs: Secondary | ICD-10-CM | POA: Insufficient documentation

## 2014-11-09 DIAGNOSIS — F17210 Nicotine dependence, cigarettes, uncomplicated: Secondary | ICD-10-CM | POA: Diagnosis not present

## 2014-11-09 DIAGNOSIS — E118 Type 2 diabetes mellitus with unspecified complications: Secondary | ICD-10-CM

## 2014-11-09 LAB — GLUCOSE, POCT (MANUAL RESULT ENTRY)
POC Glucose: 223 mg/dl — AB (ref 70–99)
POC Glucose: 286 mg/dl — AB (ref 70–99)

## 2014-11-09 LAB — POCT GLYCOSYLATED HEMOGLOBIN (HGB A1C): Hemoglobin A1C: 7.6

## 2014-11-09 MED ORDER — ACETAMINOPHEN-CODEINE #3 300-30 MG PO TABS: 1.0000 | ORAL_TABLET | Freq: Two times a day (BID) | ORAL | Status: DC | PRN

## 2014-11-09 NOTE — Progress Notes (Signed)
Patient ID: [name], male   DOB: 25-Sep-1965, 49 y.o.   MRN: [id number]   Subjective:  Patient ID: [name], male    DOB: 1966-01-22  Age: 49 y.o. MRN: [id number]  CC: Diabetes and Wound Check   HPI [name] presents for    1. Chronic sacral wound: patient reports that he went to wound care after our last OV. However, there are no notes in the chart to confirm this. He reports chronic sacral pain with prolonged sitting and standing. He reports that he was informed at his last wound care visit that vaseline and gauze was the only thing needed and he could f/u as needed. He reports that he has been dressing his wound at home with vaseline and gauze. He is out of tylenol #3 for one month. He reports trying to call for a refill but being unable to leave a message. He request stronger pain medicine. He informs me that pain management is not an option for him due to a "drug charge" in the 80s.   2. DM2: compliant with insulin. No low sugars. Blood sugars "normal".   Social History  Substance Use Topics  . Smoking status: Current Every Day Smoker    Types: Cigarettes  . Smokeless tobacco: Not on file  . Alcohol Use: No   Outpatient Prescriptions Prior to Visit  Medication Sig Dispense Refill  . ACCU-CHEK FASTCLIX LANCETS MISC 1 each by Does not apply route 3 (three) times daily. E11.9 102 each 11  . acetaminophen-codeine (TYLENOL #3) 300-30 MG per tablet Take 1 tablet by mouth 2 (two) times daily as needed for moderate pain. 60 tablet 2  . albuterol (PROVENTIL) (2.5 MG/3ML) 0.083% nebulizer solution Take 2.5 mg by nebulization every 6 (six) hours as needed for wheezing or shortness of breath.    Marland Kitchen alum & mag hydroxide-simeth (MAALOX/MYLANTA) 200-200-20 MG/5ML suspension Take 30 mLs by mouth every 6 (six) hours as needed for indigestion or heartburn (dyspepsia). 355 mL 0  . antiseptic oral rinse (CPC / CETYLPYRIDINIUM CHLORIDE 0.05%) 0.05 % LIQD solution 7 mLs by Mouth Rinse route 2  times daily at 12 noon and 4 pm. 44 mL 0  . atenolol (TENORMIN) 25 MG tablet Take 1 tablet (25 mg total) by mouth daily. 90 tablet 3  . atorvastatin (LIPITOR) 20 MG tablet Take 1 tablet (20 mg total) by mouth daily. 30 tablet 11  . Blood Glucose Monitoring Suppl (ACCU-CHEK NANO SMARTVIEW) W/DEVICE KIT 1 Device by Does not apply route as needed. E 11.9 1 kit 0  . furosemide (LASIX) 40 MG tablet Take 1 tablet (40 mg total) by mouth 2 (two) times daily. Take 1 tab in the morning and 1 tab at 2 pm 180 tablet 3  . glucose blood (ACCU-CHEK SMARTVIEW) test strip 1 each by Other route 3 (three) times daily. E11.9 100 each 11  . HYDROcodone-acetaminophen (NORCO/VICODIN) 5-325 MG per tablet     . losartan (COZAAR) 100 MG tablet Take 1 tablet (100 mg total) by mouth daily. 90 tablet 3  . metFORMIN (GLUCOPHAGE) 1000 MG tablet Take 1 tablet (1,000 mg total) by mouth 2 (two) times daily with a meal. 180 tablet 3  . potassium chloride SA (K-DUR,KLOR-CON) 20 MEQ tablet Take 1 tablet (20 mEq total) by mouth daily. 90 tablet 3   No facility-administered medications prior to visit.    ROS Review of Systems  Constitutional: Negative for fever, chills, fatigue and unexpected weight change.  Musculoskeletal: Positive  for back pain. Negative for myalgias, arthralgias, gait problem and neck pain.  Skin: Positive for wound. Negative for rash.       Chronic sacral wound   Psychiatric/Behavioral: Positive for dysphoric mood.    Objective:  BP 127/74 mmHg  Pulse 86  Temp(Src) 98.6 F (37 C) (Oral)  Resp 20  Ht 5' 7"  (1.702 m)  Wt 407 lb (184.614 kg)  BMI 63.73 kg/m2  SpO2 96%  BP/Weight 11/09/2014 08/26/2014 1/57/2620  Systolic BP 355 - -  Diastolic BP 74 - -  Wt. (Lbs) 407 402 402.3  BMI 63.73 62.95 62.99   Physical Exam  Constitutional: He appears well-developed and well-nourished. No distress.  Morbidly obese   HENT:  Head: Normocephalic and atraumatic.  Neck: Normal range of motion. Neck supple.    Pulmonary/Chest: Effort normal.  Neurological: He is alert.  Skin: Skin is warm and dry. No rash noted. No erythema.     Psychiatric: His speech is normal and behavior is normal. Judgment and thought content normal. His affect is angry. Cognition and memory are normal.    Lab Results  Component Value Date   HGBA1C 7.60 11/09/2014   CBG 223  Assessment & Plan:   Problem List Items Addressed This Visit    Decubitus ulcer of sacral area (Chronic)   Relevant Medications   acetaminophen-codeine (TYLENOL #3) 300-30 MG tablet   Type II diabetes mellitus (HCC) - Primary (Chronic)   Relevant Orders   POCT glycosylated hemoglobin (Hb A1C) (Completed)   POCT glucose (manual entry) (Completed)   POCT glucose (manual entry) (Completed)      No orders of the defined types were placed in this encounter.    Follow-up: No Follow-up on file.   Boykin Nearing MD

## 2014-11-09 NOTE — Patient Instructions (Signed)
[name],  You diabetes is well controlled Your sacral ulcer is improved but not completely healed  F/u in 3-4 months for diabetes   Dr. Roney Marion was seen today for diabetes and wound check.  Diagnoses and all orders for this visit:  Type 2 diabetes mellitus with complication, unspecified long term insulin use status (HCC) -     POCT glycosylated hemoglobin (Hb A1C) -     POCT glucose (manual entry) -     POCT glucose (manual entry)  Decubitus ulcer of sacral area, stage III (HCC) -     acetaminophen-codeine (TYLENOL #3) 300-30 MG tablet; Take 1 tablet by mouth 2 (two) times daily as needed for moderate pain.

## 2014-11-09 NOTE — Progress Notes (Signed)
F/U DM, Wound care  Pain scale #9  Glucose been running "All Right" No changes on medication per pt   Hx tobacco- 3 cigarette per day  Pt refused, depression screen form

## 2014-11-09 NOTE — Assessment & Plan Note (Addendum)
Chronic ulcer w/o infection  Patient refuses wound care f/u  Pain control with tylenol #3

## 2014-11-09 NOTE — Assessment & Plan Note (Signed)
Fair control Continue current regimen

## 2014-12-02 ENCOUNTER — Other Ambulatory Visit (INDEPENDENT_AMBULATORY_CARE_PROVIDER_SITE_OTHER): Payer: Medicare Other

## 2014-12-02 DIAGNOSIS — I509 Heart failure, unspecified: Secondary | ICD-10-CM | POA: Diagnosis not present

## 2014-12-02 DIAGNOSIS — I5033 Acute on chronic diastolic (congestive) heart failure: Secondary | ICD-10-CM

## 2014-12-02 LAB — BASIC METABOLIC PANEL
BUN: 15 mg/dL (ref 7–25)
CO2: 30 mmol/L (ref 20–31)
Calcium: 9 mg/dL (ref 8.6–10.3)
Chloride: 102 mmol/L (ref 98–110)
Creat: 0.9 mg/dL (ref 0.60–1.35)
Glucose, Bld: 146 mg/dL — ABNORMAL HIGH (ref 65–99)
Potassium: 4 mmol/L (ref 3.5–5.3)
Sodium: 140 mmol/L (ref 135–146)

## 2014-12-07 ENCOUNTER — Ambulatory Visit: Payer: Medicare Other | Admitting: Cardiology

## 2014-12-15 ENCOUNTER — Emergency Department (HOSPITAL_COMMUNITY)
Admission: EM | Admit: 2014-12-15 | Discharge: 2014-12-15 | Disposition: A | Discharge status: Home / Self Care | Payer: Medicare Other | Attending: Emergency Medicine | Admitting: Emergency Medicine

## 2014-12-15 ENCOUNTER — Encounter (HOSPITAL_COMMUNITY): Payer: Self-pay | Admitting: Emergency Medicine

## 2014-12-15 ENCOUNTER — Emergency Department (HOSPITAL_COMMUNITY): Payer: Medicare Other

## 2014-12-15 DIAGNOSIS — M25561 Pain in right knee: Secondary | ICD-10-CM | POA: Diagnosis not present

## 2014-12-15 DIAGNOSIS — L89320 Pressure ulcer of left buttock, unstageable: Secondary | ICD-10-CM | POA: Insufficient documentation

## 2014-12-15 DIAGNOSIS — Z720 Tobacco use: Secondary | ICD-10-CM | POA: Insufficient documentation

## 2014-12-15 DIAGNOSIS — Z79899 Other long term (current) drug therapy: Secondary | ICD-10-CM | POA: Insufficient documentation

## 2014-12-15 DIAGNOSIS — J449 Chronic obstructive pulmonary disease, unspecified: Secondary | ICD-10-CM | POA: Insufficient documentation

## 2014-12-15 DIAGNOSIS — G4730 Sleep apnea, unspecified: Secondary | ICD-10-CM | POA: Diagnosis not present

## 2014-12-15 DIAGNOSIS — E119 Type 2 diabetes mellitus without complications: Secondary | ICD-10-CM | POA: Insufficient documentation

## 2014-12-15 DIAGNOSIS — Z9981 Dependence on supplemental oxygen: Secondary | ICD-10-CM | POA: Insufficient documentation

## 2014-12-15 DIAGNOSIS — L89319 Pressure ulcer of right buttock, unspecified stage: Secondary | ICD-10-CM | POA: Diagnosis not present

## 2014-12-15 DIAGNOSIS — Y998 Other external cause status: Secondary | ICD-10-CM | POA: Diagnosis not present

## 2014-12-15 DIAGNOSIS — Y92002 Bathroom of unspecified non-institutional (private) residence single-family (private) house as the place of occurrence of the external cause: Secondary | ICD-10-CM | POA: Insufficient documentation

## 2014-12-15 DIAGNOSIS — S8991XA Unspecified injury of right lower leg, initial encounter: Secondary | ICD-10-CM | POA: Insufficient documentation

## 2014-12-15 DIAGNOSIS — W010XXA Fall on same level from slipping, tripping and stumbling without subsequent striking against object, initial encounter: Secondary | ICD-10-CM | POA: Insufficient documentation

## 2014-12-15 DIAGNOSIS — I10 Essential (primary) hypertension: Secondary | ICD-10-CM | POA: Insufficient documentation

## 2014-12-15 DIAGNOSIS — Y9389 Activity, other specified: Secondary | ICD-10-CM | POA: Diagnosis not present

## 2014-12-15 NOTE — ED Notes (Signed)
Per pt, states he slipped getting out of tub-fell onto right knee-thinks he "stretched" his bed sore that is across his buttocks

## 2014-12-15 NOTE — Discharge Instructions (Signed)
There is not appear to be an emergent cause for your knee pain at this time. Please use your Ace wrap during activity. Follow-up with your doctor in 1 week for reevaluation. Continue taking ibuprofen and/or Tylenol for your discomfort. Return to ED for any new or worsening symptoms.  Joint Pain Joint pain, which is also called arthralgia, can be caused by many things. Joint pain often goes away when you follow your health care provider's instructions for relieving pain at home. However, joint pain can also be caused by conditions that require further treatment. Common causes of joint pain include:  Bruising in the area of the joint.  Overuse of the joint.  Wear and tear on the joints that occur with aging (osteoarthritis).  Various other forms of arthritis.  A buildup of a crystal form of uric acid in the joint (gout).  Infections of the joint (septic arthritis) or of the bone (osteomyelitis). Your health care provider may recommend medicine to help with the pain. If your joint pain continues, additional tests may be needed to diagnose your condition. HOME CARE INSTRUCTIONS Watch your condition for any changes. Follow these instructions as directed to lessen the pain that you are feeling.  Take medicines only as directed by your health care provider.  Rest the affected area for as long as your health care provider says that you should. If directed to do so, raise the painful joint above the level of your heart while you are sitting or lying down.  Do not do things that cause or worsen pain.  If directed, apply ice to the painful area:  Put ice in a plastic bag.  Place a towel between your skin and the bag.  Leave the ice on for 20 minutes, 2-3 times per day.  Wear an elastic bandage, splint, or sling as directed by your health care provider. Loosen the elastic bandage or splint if your fingers or toes become numb and tingle, or if they turn cold and blue.  Begin exercising or  stretching the affected area as directed by your health care provider. Ask your health care provider what types of exercise are safe for you.  Keep all follow-up visits as directed by your health care provider. This is important. SEEK MEDICAL CARE IF:  Your pain increases, and medicine does not help.  Your joint pain does not improve within 3 days.  You have increased bruising or swelling.  You have a fever.  You lose 10 lb (4.5 kg) or more without trying. SEEK IMMEDIATE MEDICAL CARE IF:  You are not able to move the joint.  Your fingers or toes become numb or they turn cold and blue.   This information is not intended to replace advice given to you by your health care provider. Make sure you discuss any questions you have with your health care provider.   Document Released: 01/21/2005 Document Revised: 02/11/2014 Document Reviewed: 11/02/2013 Elsevier Interactive Patient Education [employer].

## 2014-12-15 NOTE — ED Provider Notes (Signed)
CSN: [id number]     Arrival date & time 12/15/14  1149 History   First MD Initiated Contact with Patient 12/15/14 1241     Chief Complaint  Patient presents with  . fall/knee pain      (Consider location/radiation/quality/duration/timing/severity/associated sxs/prior Treatment) HPI [name] is a 49 y.o. male who comes in for evaluation of a fall. Patient reports he was getting out of the bathtub yesterday when he slipped and fell on his right knee. He reports difficulties walking since that time. He feels like he may have stretched a bedsore that is on his buttock. He has not tried anything to improve his symptoms. Certainly movements make it worse. He denies any head trauma, loss of consciousness, numbness or weakness, chest pain, shortness of breath, abdominal pain, nausea or vomiting, diarrhea or constipation, urinary symptoms. Rates pain now in the ED is moderate. No other aggravating or modifying factors  Past Medical History  Diagnosis Date  . Ventilator dependent ([place])   . Sleep apnea Dx Oct 2011  . Hypertension Dx Oct 2012  . CHF (congestive heart failure) [employer]) Dx Oct 2012  . COPD (chronic obstructive pulmonary disease) ([place]) Dx 2013  . Diabetes mellitus without complication [facility]) Dx Nov 2015   Past Surgical History  Procedure Laterality Date  . Wound debridement  12/21/2010    Procedure: DEBRIDEMENT WOUND;  Surgeon: Harl Bowie, MD;  Location: [facility] OR;  Service: General;  Laterality: N/A;  Sacral  . Tracheostomy tube placement     Family History  Problem Relation Age of Onset  . Cancer Brother   . Chronic Renal Failure Sister    Social History  Substance Use Topics  . Smoking status: Current Every Day Smoker    Types: Cigarettes  . Smokeless tobacco: None  . Alcohol Use: No    Review of Systems A 10 point review of systems was completed and was negative except for pertinent positives and negatives as mentioned in the history of present illness      Allergies  Tramadol; Ibuprofen; and Robaxin  Home Medications   Prior to Admission medications   Medication Sig Start Date End Date Taking? Authorizing Provider  acetaminophen-codeine (TYLENOL #3) 300-30 MG tablet Take 1 tablet by mouth 2 (two) times daily as needed for moderate pain. 11/09/14  Yes Josalyn Funches, MD  albuterol (PROVENTIL) (2.5 MG/3ML) 0.083% nebulizer solution Take 2.5 mg by nebulization every 6 (six) hours as needed for wheezing or shortness of breath.   Yes Historical Provider, MD  atenolol (TENORMIN) 25 MG tablet Take 1 tablet (25 mg total) by mouth daily. 08/04/14  Yes Dorothy Spark, MD  atorvastatin (LIPITOR) 20 MG tablet Take 1 tablet (20 mg total) by mouth daily. 05/05/14  Yes Josalyn Funches, MD  furosemide (LASIX) 40 MG tablet Take 1 tablet (40 mg total) by mouth 2 (two) times daily. Take 1 tab in the morning and 1 tab at 2 pm 08/04/14  Yes Dorothy Spark, MD  HYDROcodone-acetaminophen (NORCO/VICODIN) 5-325 MG per tablet Take 1 tablet by mouth every 8 (eight) hours as needed for moderate pain.  06/22/14  Yes Historical Provider, MD  losartan (COZAAR) 100 MG tablet Take 1 tablet (100 mg total) by mouth daily. 05/02/14  Yes Boykin Nearing, MD  metFORMIN (GLUCOPHAGE) 1000 MG tablet Take 1 tablet (1,000 mg total) by mouth 2 (two) times daily with a meal. 05/02/14  Yes Josalyn Funches, MD  polyethylene glycol (MIRALAX / GLYCOLAX) packet Take 17 g by  mouth 2 (two) times daily as needed for mild constipation.   Yes Historical Provider, MD  potassium chloride SA (K-DUR,KLOR-CON) 20 MEQ tablet Take 1 tablet (20 mEq total) by mouth daily. 08/04/14  Yes Dorothy Spark, MD  ACCU-CHEK FASTCLIX LANCETS MISC 1 each by Does not apply route 3 (three) times daily. E11.9 05/02/14   Boykin Nearing, MD  alum & mag hydroxide-simeth (MAALOX/MYLANTA) 200-200-20 MG/5ML suspension Take 30 mLs by mouth every 6 (six) hours as needed for indigestion or heartburn (dyspepsia). 12/02/13   Robbie Lis, MD  antiseptic oral rinse (CPC / CETYLPYRIDINIUM CHLORIDE 0.05%) 0.05 % LIQD solution 7 mLs by Mouth Rinse route 2 times daily at 12 noon and 4 pm. 12/02/13   Robbie Lis, MD  Blood Glucose Monitoring Suppl (ACCU-CHEK NANO SMARTVIEW) W/DEVICE KIT 1 Device by Does not apply route as needed. E 11.9 05/02/14   Josalyn Funches, MD  glucose blood (ACCU-CHEK SMARTVIEW) test strip 1 each by Other route 3 (three) times daily. E11.9 05/02/14   Josalyn Funches, MD   BP 142/76 mmHg  Pulse 96  Temp(Src) 98.4 F (36.9 C) (Oral)  Resp 20  SpO2 99% Physical Exam  Constitutional: He is oriented to person, place, and time. He appears well-developed and well-nourished.  Morbidly obese African-American male  HENT:  Head: Normocephalic and atraumatic.  Mouth/Throat: Oropharynx is clear and moist.  Eyes: Conjunctivae are normal. Pupils are equal, round, and reactive to light. Right eye exhibits no discharge. Left eye exhibits no discharge. No scleral icterus.  Neck: Neck supple.  Cardiovascular: Normal rate, regular rhythm and normal heart sounds.   Pulmonary/Chest: Effort normal and breath sounds normal. No respiratory distress. He has no wheezes. He has no rales.  Abdominal: Soft. There is no tenderness.  Genitourinary:  Chronic decubitus ulcer across both buttocks. Mostly scarring with no evidence of new skin breakdown No evidence of acute injury or worsening  Musculoskeletal: He exhibits no tenderness.  Neurological: He is alert and oriented to person, place, and time.  Cranial Nerves II-XII grossly intact. Motor strength appears to be baseline. Sensation intact. Gait is somewhat antalgic but without any ataxia  Skin: Skin is warm and dry. No rash noted.  Psychiatric: He has a normal mood and affect.  Nursing note and vitals reviewed.   ED Course  Procedures (including critical care time) Labs Review Labs Reviewed - No data to display  Imaging Review Dg Knee Complete 4 Views  Right  12/15/2014  CLINICAL DATA:  Status post fall with right knee pain EXAM: RIGHT KNEE - COMPLETE 4+ VIEW COMPARISON:  None. FINDINGS: There is no evidence of fracture, dislocation, or joint effusion. There is minimal tibial spine osteophytosis. Soft tissues are unremarkable. IMPRESSION: No acute fracture or dislocation. Electronically Signed   By: Abelardo Diesel M.D.   On: 12/15/2014 13:48   I have personally reviewed and evaluated these images and lab results as part of my medical decision-making.   EKG Interpretation None     Meds given in ED:  Medications - No data to display  Discharge Medication List as of 12/15/2014  2:37 PM     Filed Vitals:   12/15/14 1200 12/15/14 1512  BP: 141/77 142/76  Pulse: 95 96  Temp: 98.4 F (36.9 C)   TempSrc: Oral   Resp: 20   SpO2: 91% 99%    MDM  Vitals stable - WNL -afebrile Pt resting comfortably in ED. PE--full active range of motion of right knee, right  hip and ankle, no ligament laxity. No focal bony tenderness. Imaging--plain films of right knee negative.  Patient with right knee after slipping fall. No evidence of acute or emergent pathology. Will DC with Ace wrap, Rice therapy and have patient follow-up with PCP I discussed all relevant lab findings and imaging results with pt and they verbalized understanding. Discussed f/u with PCP within 48 hrs and return precautions, pt very amenable to plan.  Final diagnoses:  Right knee pain        Comer Locket, PA-C 12/15/14 [place], MD 12/16/14 [phone]

## 2014-12-22 DIAGNOSIS — Z430 Encounter for attention to tracheostomy: Secondary | ICD-10-CM | POA: Diagnosis not present

## 2014-12-22 DIAGNOSIS — E662 Morbid (severe) obesity with alveolar hypoventilation: Secondary | ICD-10-CM | POA: Diagnosis not present

## 2014-12-22 DIAGNOSIS — L309 Dermatitis, unspecified: Secondary | ICD-10-CM | POA: Diagnosis not present

## 2014-12-22 DIAGNOSIS — Z930 Tracheostomy status: Secondary | ICD-10-CM | POA: Diagnosis not present

## 2015-01-09 ENCOUNTER — Ambulatory Visit: Payer: Medicare Other | Admitting: Cardiology

## 2015-01-17 ENCOUNTER — Telehealth: Payer: Self-pay | Admitting: Family Medicine

## 2015-01-17 NOTE — Telephone Encounter (Signed)
Delrae Rend, from [place], needs forms completed for the patient's care. Please contact Alisa at [phone] once completed and ready for pick up. Thank you, Fonda Kinder, ASA

## 2015-01-18 NOTE — Telephone Encounter (Signed)
Home health form completed Junious Dresser, please call Alisa for pick up

## 2015-01-18 NOTE — Telephone Encounter (Signed)
LVM to Alisha form [facility]    Form completed, form at front office ready to be pickup

## 2015-01-20 ENCOUNTER — Ambulatory Visit (INDEPENDENT_AMBULATORY_CARE_PROVIDER_SITE_OTHER): Payer: Medicare Other | Admitting: Cardiology

## 2015-01-20 ENCOUNTER — Encounter: Payer: Self-pay | Admitting: Cardiology

## 2015-01-20 VITALS — BP 124/82 | HR 88 | Ht 67.0 in | Wt >= 6400 oz

## 2015-01-20 DIAGNOSIS — R0609 Other forms of dyspnea: Secondary | ICD-10-CM

## 2015-01-20 DIAGNOSIS — E662 Morbid (severe) obesity with alveolar hypoventilation: Secondary | ICD-10-CM | POA: Diagnosis not present

## 2015-01-20 DIAGNOSIS — I517 Cardiomegaly: Secondary | ICD-10-CM | POA: Diagnosis not present

## 2015-01-20 DIAGNOSIS — R0600 Dyspnea, unspecified: Secondary | ICD-10-CM | POA: Insufficient documentation

## 2015-01-20 DIAGNOSIS — I10 Essential (primary) hypertension: Secondary | ICD-10-CM | POA: Diagnosis not present

## 2015-01-20 MED ORDER — FUROSEMIDE 40 MG PO TABS: 40.0000 mg | ORAL_TABLET | Freq: Two times a day (BID) | ORAL | Status: DC

## 2015-01-20 NOTE — Patient Instructions (Signed)
Your physician recommends that you continue on your current medications as directed. Please refer to the Current Medication list given to you today.    Your physician recommends that you schedule a follow-up appointment in: AS NEEDED WITH DR NELSON  

## 2015-01-20 NOTE — Progress Notes (Signed)
Patient ID: [name], male   DOB: Jan 29, 1966, 49 y.o.   MRN: [id number]      Cardiology Office Note   Date:  01/20/2015   ID:  [name], DOB [birth year], MRN [id number]  PCP:  Minerva Ends, MD  Cardiologist:  Dorothy Spark, MD   Chief complain: DOE   History of Present Illness: [name] is a 49 y.o. male who presents for evaluation of SOB. The patient is morbidly obese (BMI 62), with obesity hypoventilation syndrome, with tracheostomy since 2013, hypertension, OSA, CHF (no echocardiogram in our system), COPD. The patient is severely deconditioned, sleeps in sitting position, otherwise wouldn't be able to breath. He can walk minimal distances. He denies any chest pain, palpitations, or syncope. No claudications.   08/04/2014 - the patient is coming after 1 month, he underwent an echocardiogram that showed normal systolic and diastolic function.  No significant improvement with lasix, he in fact gained 6 lbs, he continues to eat very unhealthy diet - rahmen noodles and a lot of salt. Minimal activity. He is complaining about not being able to afford healthy diet.  01/20/2015 - the patient is coming after 6 months, he admits to not taking his furosemide, and not being compliant to his low salt diet, he was taken away his food stamps and is going through different shelters/soup kitchen in order to get food. He has hard time breathing and is coughing frequently, non-productive.  Past Medical History  Diagnosis Date  . Ventilator dependent ([place])   . Sleep apnea Dx Oct 2011  . Hypertension Dx Oct 2012  . CHF (congestive heart failure) [facility]) Dx Oct 2012  . COPD (chronic obstructive pulmonary disease) ([place]) Dx 2013  . Diabetes mellitus without complication [facility]) Dx Nov 2015   Past Surgical History  Procedure Laterality Date  . Wound debridement  12/21/2010    Procedure: DEBRIDEMENT WOUND;  Surgeon: Harl Bowie, MD;  Location: [place];  Service: General;   Laterality: N/A;  Sacral  . Tracheostomy tube placement     Current Outpatient Prescriptions  Medication Sig Dispense Refill  . ACCU-CHEK FASTCLIX LANCETS MISC 1 each by Does not apply route 3 (three) times daily. E11.9 102 each 11  . acetaminophen-codeine (TYLENOL #3) 300-30 MG tablet Take 1 tablet by mouth 2 (two) times daily as needed for moderate pain. 60 tablet 2  . albuterol (PROVENTIL) (2.5 MG/3ML) 0.083% nebulizer solution Take 2.5 mg by nebulization every 6 (six) hours as needed for wheezing or shortness of breath.    Marland Kitchen alum & mag hydroxide-simeth (MAALOX/MYLANTA) 200-200-20 MG/5ML suspension Take 30 mLs by mouth every 6 (six) hours as needed for indigestion or heartburn (dyspepsia). 355 mL 0  . antiseptic oral rinse (CPC / CETYLPYRIDINIUM CHLORIDE 0.05%) 0.05 % LIQD solution 7 mLs by Mouth Rinse route 2 times daily at 12 noon and 4 pm. 44 mL 0  . atenolol (TENORMIN) 25 MG tablet Take 1 tablet (25 mg total) by mouth daily. 90 tablet 3  . atorvastatin (LIPITOR) 20 MG tablet Take 1 tablet (20 mg total) by mouth daily. 30 tablet 11  . Blood Glucose Monitoring Suppl (ACCU-CHEK NANO SMARTVIEW) W/DEVICE KIT 1 Device by Does not apply route as needed. E 11.9 1 kit 0  . furosemide (LASIX) 40 MG tablet Take 1 tablet (40 mg total) by mouth 2 (two) times daily. Take 1 tab in the morning and 1 tab at 2 pm 180 tablet 3  . glucose blood (ACCU-CHEK  SMARTVIEW) test strip 1 each by Other route 3 (three) times daily. E11.9 100 each 11  . HYDROcodone-acetaminophen (NORCO/VICODIN) 5-325 MG per tablet Take 1 tablet by mouth every 8 (eight) hours as needed for moderate pain.     Marland Kitchen losartan (COZAAR) 100 MG tablet Take 1 tablet (100 mg total) by mouth daily. 90 tablet 3  . metFORMIN (GLUCOPHAGE) 1000 MG tablet Take 1 tablet (1,000 mg total) by mouth 2 (two) times daily with a meal. 180 tablet 3  . polyethylene glycol (MIRALAX / GLYCOLAX) packet Take 17 g by mouth 2 (two) times daily as needed for mild  constipation.    . potassium chloride SA (K-DUR,KLOR-CON) 20 MEQ tablet Take 1 tablet (20 mEq total) by mouth daily. 90 tablet 3   No current facility-administered medications for this visit.   Allergies:   Tramadol; Ibuprofen; and Robaxin   Social History:  The patient  reports that he has been smoking Cigarettes.  He does not have any smokeless tobacco history on file. He reports that he uses illicit drugs (Marijuana). He reports that he does not drink alcohol.   Family History:  The patient's family history includes Cancer in his brother; Chronic Renal Failure in his sister.   ROS:  Please see the history of present illness.   Otherwise, review of systems are positive for none.   All other systems are reviewed and negative.   PHYSICAL EXAM: VS: BP: 154/62 mmHg, Weight: 405 lbs, SpO2 95% , BMI There is no weight on file to calculate BMI. GEN: morbidly obese, cought and gasps for breath while talking HEENT: tracheostomy in place Neck: no JVD, carotid bruits, or masses Cardiac: RRR; no murmurs, rubs, or gallops, lymphedema  Respiratory:  clear to auscultation bilaterally, normal work of breathing GI: soft, nontender, nondistended, + BS MS: no deformity or atrophy Skin: warm and dry, no rash Neuro:  Strength and sensation are intact Psych: euthymic mood, full affect  EKG:  EKG is ordered today. The ekg ordered today demonstrates NSR, Right superior axis deviation, low voltage ECG  Recent Labs: 12/02/2014: BUN 15; Creat 0.90; Potassium 4.0; Sodium 140   Lipid Panel    Component Value Date/Time   CHOL 187 05/02/2014 1115   TRIG 106 05/02/2014 1115   HDL 36* 05/02/2014 1115   CHOLHDL 5.2 05/02/2014 1115   VLDL 21 05/02/2014 1115   LDLCALC 130* 05/02/2014 1115   Wt Readings from Last 3 Encounters:  11/09/14 407 lb (184.614 kg)  08/26/14 402 lb (182.346 kg)  07/29/14 402 lb 4.8 oz (182.482 kg)    TTE: 06/2014 Left ventricle: The cavity size was normal. There was mild  focal basal hypertrophy of the septum. Systolic function was normal. The estimated ejection fraction was in the range of 55% to 60%. Wall motion was normal; there were no regional wall motion abnormalities. Left ventricular diastolic function parameters were normal. - Left atrium: The atrium was mildly dilated. - Right ventricle: The cavity size was mildly dilated. - Right atrium: The atrium was mildly dilated.   ASSESSMENT AND PLAN:  49 year old male   1. DOE - multifactorial, chronic tracheostomy, obesity hypoventilation syndrome, his systolic and diastolic function is normal, we will increase lasix to 40 mg po bid, however mainstay would be adherence to low salt diet. Restart/refill furosemide.  2. Hypertension - controlled after adding atenolol 25 mg po daily.  3. Obesity - the patient started is struggling to get his food, unable to buy nutritious food.  Follow up in 4 months with BMP (he refused today).  Signed, Dorothy Spark, MD  01/20/2015 3:17 PM    [facility] [address], [place], [place]  [address] Phone: [phone]; Fax: [phone]

## 2015-01-26 ENCOUNTER — Emergency Department (INDEPENDENT_AMBULATORY_CARE_PROVIDER_SITE_OTHER): Payer: Medicare Other

## 2015-01-26 ENCOUNTER — Encounter (HOSPITAL_COMMUNITY): Payer: Self-pay | Admitting: Emergency Medicine

## 2015-01-26 ENCOUNTER — Emergency Department (INDEPENDENT_AMBULATORY_CARE_PROVIDER_SITE_OTHER)
Admission: EM | Admit: 2015-01-26 | Discharge: 2015-01-26 | Disposition: A | Discharge status: Home / Self Care | Payer: Medicare Other | Source: Home / Self Care | Attending: Family Medicine | Admitting: Family Medicine

## 2015-01-26 DIAGNOSIS — J449 Chronic obstructive pulmonary disease, unspecified: Secondary | ICD-10-CM

## 2015-01-26 DIAGNOSIS — J40 Bronchitis, not specified as acute or chronic: Secondary | ICD-10-CM

## 2015-01-26 DIAGNOSIS — R0982 Postnasal drip: Secondary | ICD-10-CM | POA: Diagnosis not present

## 2015-01-26 DIAGNOSIS — E662 Morbid (severe) obesity with alveolar hypoventilation: Secondary | ICD-10-CM

## 2015-01-26 DIAGNOSIS — Z8679 Personal history of other diseases of the circulatory system: Secondary | ICD-10-CM | POA: Diagnosis not present

## 2015-01-26 DIAGNOSIS — R0602 Shortness of breath: Secondary | ICD-10-CM | POA: Diagnosis not present

## 2015-01-26 DIAGNOSIS — R05 Cough: Secondary | ICD-10-CM | POA: Diagnosis not present

## 2015-01-26 MED ORDER — CEFDINIR 300 MG PO CAPS: 300.0000 mg | ORAL_CAPSULE | Freq: Two times a day (BID) | ORAL | Status: DC

## 2015-01-26 NOTE — Discharge Instructions (Signed)
Chronic Bronchitis Chronic bronchitis is a lasting inflammation of the bronchial tubes, which are the tubes that carry air into your lungs. This is inflammation that occurs:   On most days of the week.   For at least three months at a time.   Over a period of two years in a row. When the bronchial tubes are inflamed, they start to produce mucus. The inflammation and buildup of mucus make it more difficult to breathe. Chronic bronchitis is usually a permanent problem and is one type of chronic obstructive pulmonary disease (COPD). People with chronic bronchitis are at greater risk for getting repeated colds, or respiratory infections. CAUSES  Chronic bronchitis most often occurs in people who have:  Long-standing, severe asthma.  A history of smoking.  Asthma and who also smoke. SIGNS AND SYMPTOMS  Chronic bronchitis may cause the following:   A cough that brings up mucus (productive cough).  Shortness of breath.  Early morning headache.  Wheezing.  Chest discomfort.   Recurring respiratory infections. DIAGNOSIS  Your health care provider may confirm the diagnosis by:  Taking your medical history.  Performing a physical exam.  Taking a chest X-ray.   Performing pulmonary function tests. TREATMENT  Treatment involves controlling symptoms with medicines, oxygen therapy, or making lifestyle changes, such as exercising and eating a healthy, well-balanced diet. Medicines could include:  Inhalers to improve air flow in and out of your lungs.  Antibiotics to treat bacterial infections, such as pneumonia, sinus infections, and acute bronchitis. As a preventative measure, your health care provider may recommend routine vaccinations for influenza and pneumonia. This is to prevent infection and hospitalization since you may be more at risk for these types of infections.  HOME CARE INSTRUCTIONS  Take medicines only as directed by your health care provider.   If you smoke  cigarettes, chew tobacco, or use electronic cigarettes, quit. If you need help quitting, ask your health care provider.  Avoid pollen, dust, animal dander, molds, smoke, and other things that cause shortness of breath or wheezing attacks.  Talk to your health care provider about possible exercise routines. Regular exercise is very important to help you feel better.  If you are prescribed oxygen use at home follow these guidelines:  Never smoke while using oxygen. Oxygen does not burn or explode, but flammable materials will burn faster in the presence of oxygen.  Keep a Data processing manager close by. Let your fire department know that you have oxygen in your home.  Warn visitors not to smoke near you when you are using oxygen. Put up "no smoking" signs in your home where you most often use the oxygen.  Regularly test your smoke detectors at home to make sure they work. If you receive care in your home from a nurse or other health care provider, he or she may also check to make sure your smoke detectors work.  Ask your health care provider whether you would benefit from a pulmonary rehabilitation program.  Do not wait to get medical care if you have any concerning symptoms. Delays could cause permanent injury and may be life threatening. SEEK MEDICAL CARE IF:  You have increased coughing or shortness of breath or both.  You have muscle aches.  You have chest pain.  Your mucus gets thicker.  Your mucus changes from clear or white to yellow, green, gray, or bloody. SEEK IMMEDIATE MEDICAL CARE IF:  Your usual medicines do not stop your wheezing.   You have increased difficulty breathing.  You have any problems with the medicine you are taking, such as a rash, itching, swelling, or trouble breathing. MAKE SURE YOU:   Understand these instructions.  Will watch your condition.  Will get help right away if you are not doing well or get worse.   This information is not intended to  replace advice given to you by your health care provider. Make sure you discuss any questions you have with your health care provider.   Document Released: 11/08/2005 Document Revised: 02/11/2014 Document Reviewed: 03/01/2013 Elsevier Interactive Patient Education 2016 Elsevier Inc.  Chronic Obstructive Pulmonary Disease Exacerbation Chronic obstructive pulmonary disease (COPD) is a common lung problem. In COPD, the flow of air from the lungs is limited. COPD exacerbations are times that breathing gets worse and you need extra treatment. Without treatment they can be life threatening. If they happen often, your lungs can become more damaged. If your COPD gets worse, your doctor may treat you with:  Medicines.  Oxygen.  Different ways to clear your airway, such as using a mask. HOME CARE  Do not smoke.  Avoid tobacco smoke and other things that bother your lungs.  If given, take your antibiotic medicine as told. Finish the medicine even if you start to feel better.  Only take medicines as told by your doctor.  Drink enough fluids to keep your pee (urine) clear or pale yellow (unless your doctor has told you not to).  Use a cool mist machine (vaporizer).  If you use oxygen or a machine that turns liquid medicine into a mist (nebulizer), continue to use them as told.  Keep up with shots (vaccinations) as told by your doctor.  Exercise regularly.  Eat healthy foods.  Keep all doctor visits as told. GET HELP RIGHT AWAY IF:  You are very short of breath and it gets worse.  You have trouble talking.  You have bad chest pain.  You have blood in your spit (sputum).  You have a fever.  You keep throwing up (vomiting).  You feel weak, or you pass out (faint).  You feel confused.  You keep getting worse. MAKE SURE YOU:  Understand these instructions.  Will watch your condition.  Will get help right away if you are not doing well or get worse.   This information is  not intended to replace advice given to you by your health care provider. Make sure you discuss any questions you have with your health care provider.   Document Released: 01/10/2011 Document Revised: 02/11/2014 Document Reviewed: 09/25/2012 Elsevier Interactive Patient Education 2016 [place].  Obesity Obesity is having too much body fat and a body mass index (BMI) of 30 or more. BMI is a number that is based on your height and weight. BMI is usually figured out by your doctor during regular wellness visits. The number is an estimate of how much body fat you have. Obesity can happen if you eat more calories than you can burn with exercise or other activity. It can cause major health problems or emergencies.  HOME CARE  Exercise and be active as told by your doctor. Try:  Using stairs when you can.  Parking farther away from store doors.  Gardening, biking, or walking.  Eat healthy foods and drinks that are low in calories. Eat more fruits and vegetables.  Limit fast food, sweets, and snack foods that are made with ingredients that are not natural (processed food).  Eat smaller amounts of food.  Keep a journal and  write down what you eat every day. Websites can help with this.  Avoid drinking alcohol. Drink more water and drinks that have no calories.  Take vitamins and dietary pills (supplements) only as told by your doctor.  Try going to weight-loss support groups or classes. These can help to lessen stress. Dietitians and counselors may also help. GET HELP RIGHT AWAY IF:  You have pain or tightness in your chest.  You have trouble breathing or feel short of breath.  You feel weak or have loss of feeling (numbness) in your legs.  You feel confused or have trouble talking.  You have sudden changes in your vision.   This information is not intended to replace advice given to you by your health care provider. Make sure you discuss any questions you have with your health  care provider.   Document Released: 04/15/2011 Document Revised: 02/11/2014 Document Reviewed: 04/15/2011 Elsevier Interactive Patient Education 2016 Elsevier Inc.  Upper Respiratory Infection, Adult Most upper respiratory infections (URIs) are caused by a virus. A URI affects the nose, throat, and upper air passages. The most common type of URI is often called "the common cold." HOME CARE   Take medicines only as told by your doctor.  Gargle warm saltwater or take cough drops to comfort your throat as told by your doctor.  Use a warm mist humidifier or inhale steam from a shower to increase air moisture. This may make it easier to breathe.  Drink enough fluid to keep your pee (urine) clear or pale yellow.  Eat soups and other clear broths.  Have a healthy diet.  Rest as needed.  Go back to work when your fever is gone or your doctor says it is okay.  You may need to stay home longer to avoid giving your URI to others.  You can also wear a face mask and wash your hands often to prevent spread of the virus.  Use your inhaler more if you have asthma.  Do not use any tobacco products, including cigarettes, chewing tobacco, or electronic cigarettes. If you need help quitting, ask your doctor. GET HELP IF:  You are getting worse, not better.  Your symptoms are not helped by medicine.  You have chills.  You are getting more short of breath.  You have brown or red mucus.  You have yellow or brown discharge from your nose.  You have pain in your face, especially when you bend forward.  You have a fever.  You have puffy (swollen) neck glands.  You have pain while swallowing.  You have white areas in the back of your throat. GET HELP RIGHT AWAY IF:   You have very bad or constant:  Headache.  Ear pain.  Pain in your forehead, behind your eyes, and over your cheekbones (sinus pain).  Chest pain.  You have long-lasting (chronic) lung disease and any of the  following:  Wheezing.  Long-lasting cough.  Coughing up blood.  A change in your usual mucus.  You have a stiff neck.  You have changes in your:  Vision.  Hearing.  Thinking.  Mood. MAKE SURE YOU:   Understand these instructions.  Will watch your condition.  Will get help right away if you are not doing well or get worse.   This information is not intended to replace advice given to you by your health care provider. Make sure you discuss any questions you have with your health care provider.   Document Released: 07/10/2007 Document Revised:  06/07/2014 Document Reviewed: 04/28/2013 Elsevier Interactive Patient Education [employer].

## 2015-01-26 NOTE — ED Notes (Signed)
Cough, congestion for 3-4 days.  Reports increase in congestion and reports phlegm is dark yellow.

## 2015-01-26 NOTE — ED Provider Notes (Signed)
CSN: [id number]     Arrival date & time 01/26/15  1315 History   First MD Initiated Contact with Patient 01/26/15 1351     Chief Complaint  Patient presents with  . URI   (Consider location/radiation/quality/duration/timing/severity/associated sxs/prior Treatment) HPI Comments: Severely and morbidly obese 49 year old male with a history of obstructive sleep apnea, CHF, COPD, type 2 diabetes mellitus and tracheostomy presents to the urgent care with shortness of breath and increased bronchial phlegm and cough. Denies fever. A recent echocardiogram revealed normal ejection fraction 55-60%. He is not having chest pain. He is a smoker. His pulse oximetry today is 92%. A review of his last 2 visits to his PCP and the ER with unrelated pulmonary complaints revealed saturations of 95-99%.     Past Medical History  Diagnosis Date  . Ventilator dependent ([place])   . Sleep apnea Dx Oct 2011  . Hypertension Dx Oct 2012  . CHF (congestive heart failure) [facility]) Dx Oct 2012  . COPD (chronic obstructive pulmonary disease) ([place]) Dx 2013  . Diabetes mellitus without complication [facility]) Dx Nov 2015   Past Surgical History  Procedure Laterality Date  . Wound debridement  12/21/2010    Procedure: DEBRIDEMENT WOUND;  Surgeon: Harl Bowie, MD;  Location: [facility] OR;  Service: General;  Laterality: N/A;  Sacral  . Tracheostomy tube placement     Family History  Problem Relation Age of Onset  . Cancer Brother   . Chronic Renal Failure Sister    Social History  Substance Use Topics  . Smoking status: Current Every Day Smoker    Types: Cigarettes  . Smokeless tobacco: None  . Alcohol Use: No    Review of Systems  Constitutional: Positive for activity change. Negative for fever.  HENT: Positive for congestion and sore throat. Negative for postnasal drip.   Respiratory: Positive for cough and shortness of breath. Negative for chest tightness.   Cardiovascular: Negative for chest pain.   Gastrointestinal: Negative.   Genitourinary: Negative.   Skin: Negative.   Neurological: Negative.     Allergies  Tramadol; Ibuprofen; and Robaxin  Home Medications   Prior to Admission medications   Medication Sig Start Date End Date Taking? Authorizing Provider  ACCU-CHEK FASTCLIX LANCETS MISC 1 each by Does not apply route 3 (three) times daily. E11.9 05/02/14   Josalyn Funches, MD  acetaminophen-codeine (TYLENOL #3) 300-30 MG tablet Take 1 tablet by mouth 2 (two) times daily as needed for moderate pain. 11/09/14   Josalyn Funches, MD  albuterol (PROVENTIL) (2.5 MG/3ML) 0.083% nebulizer solution Take 2.5 mg by nebulization every 6 (six) hours as needed for wheezing or shortness of breath.    Historical Provider, MD  alum & mag hydroxide-simeth (MAALOX/MYLANTA) 200-200-20 MG/5ML suspension Take 30 mLs by mouth every 6 (six) hours as needed for indigestion or heartburn (dyspepsia). 12/02/13   Robbie Lis, MD  antiseptic oral rinse (CPC / CETYLPYRIDINIUM CHLORIDE 0.05%) 0.05 % LIQD solution 7 mLs by Mouth Rinse route 2 times daily at 12 noon and 4 pm. 12/02/13   Robbie Lis, MD  atenolol (TENORMIN) 25 MG tablet Take 1 tablet (25 mg total) by mouth daily. 08/04/14   Dorothy Spark, MD  atorvastatin (LIPITOR) 20 MG tablet Take 1 tablet (20 mg total) by mouth daily. 05/05/14   Josalyn Funches, MD  Blood Glucose Monitoring Suppl (ACCU-CHEK NANO SMARTVIEW) W/DEVICE KIT 1 Device by Does not apply route as needed. E 11.9 05/02/14   Boykin Nearing, MD  cefdinir (  OMNICEF) 300 MG capsule Take 1 capsule (300 mg total) by mouth 2 (two) times daily. 01/26/15   Janne Napoleon, NP  furosemide (LASIX) 40 MG tablet Take 1 tablet (40 mg total) by mouth 2 (two) times daily. Take 1 tab in the morning and 1 tab at 2 pm 01/20/15   Dorothy Spark, MD  glucose blood (ACCU-CHEK SMARTVIEW) test strip 1 each by Other route 3 (three) times daily. E11.9 05/02/14   Boykin Nearing, MD  HYDROcodone-acetaminophen  (NORCO/VICODIN) 5-325 MG per tablet Take 1 tablet by mouth every 8 (eight) hours as needed for moderate pain.  06/22/14   Historical Provider, MD  losartan (COZAAR) 100 MG tablet Take 1 tablet (100 mg total) by mouth daily. 05/02/14   Boykin Nearing, MD  metFORMIN (GLUCOPHAGE) 1000 MG tablet Take 1 tablet (1,000 mg total) by mouth 2 (two) times daily with a meal. 05/02/14   Josalyn Funches, MD  polyethylene glycol (MIRALAX / GLYCOLAX) packet Take 17 g by mouth 2 (two) times daily as needed for mild constipation.    Historical Provider, MD  potassium chloride SA (K-DUR,KLOR-CON) 20 MEQ tablet Take 1 tablet (20 mEq total) by mouth daily. 08/04/14   Dorothy Spark, MD   Meds Ordered and Administered this Visit  Medications - No data to display  BP 133/80 mmHg  Pulse 93  Temp(Src) 98.6 F (37 C) (Oral)  Resp 26  SpO2 92% No data found.   Physical Exam  Constitutional:  Severely morbidly obese.  Currently sitting in his chair and unable to lie down. This is chronic as he has been unable to breathe in a supine position for some years.   Neck:  Trach tube in place.  Cardiovascular: Normal rate and regular rhythm.   Pulmonary/Chest:  Increased respiratory effort. With cough there is expectorated through the tracheostomy tube. Trach tube is clear, no sounds of obstruction. Lungs with no wheeze. Fair air movement.  Neurological: He is alert. He exhibits normal muscle tone. Coordination normal.  Skin: Skin is warm and dry. No rash noted.  Nursing note and vitals reviewed.   ED Course  Procedures (including critical care time)  Labs Review Labs Reviewed - No data to display  Imaging Review Dg Chest 2 View  01/26/2015  CLINICAL DATA:  Shortness of breath and cough EXAM: CHEST - 2 VIEW COMPARISON:  11/27/2013 FINDINGS: Cardiac shadow is enlarged. Tracheostomy catheter is seen. The lungs are well aerated bilaterally with persistent stable vascular congestion. No focal infiltrate is seen no  sizable effusion is noted. IMPRESSION: Stable vascular congestion. Electronically Signed   By: Inez Catalina M.D.   On: 01/26/2015 14:23     Visual Acuity Review  Right Eye Distance:   Left Eye Distance:   Bilateral Distance:    Right Eye Near:   Left Eye Near:    Bilateral Near:         MDM   1. Bronchitis   2. Chronic obstructive pulmonary disease, unspecified COPD type ([place])   3. PND (post-nasal drip)   4. History of CHF (congestive heart failure)   5. Morbid obesity with alveolar hypoventilation (HCC)    Chest x-ray as noted above. No new findings. Auscultation reveals no wheezes or crackles. Repeat saturation is 95-97%. He remains afebrile. Stable. St wants to go home. Trach tube is clear. Suspect he may have bronchitis and PND. We will treat with Omnicef 300 mg twice a day. Patient is instructed that if he develops increasing shortness of  breath, fever, increase in sputum, chest pain or any other worsening or new symptoms he is to call EMS or go directly to the emergency department. Patient states he understands.     Janne Napoleon, NP 01/26/15 [phone]

## 2015-02-08 NOTE — Telephone Encounter (Signed)
Pt. Called requesting to speak to the nurse. Pt. Stated its personal. Please f/u with pt.

## 2015-02-09 NOTE — Telephone Encounter (Signed)
Returned pt call Pt stated everything is good now

## 2015-02-18 ENCOUNTER — Encounter (HOSPITAL_COMMUNITY): Payer: Self-pay | Admitting: Emergency Medicine

## 2015-02-18 ENCOUNTER — Emergency Department (HOSPITAL_COMMUNITY)
Admission: EM | Admit: 2015-02-18 | Discharge: 2015-02-18 | Disposition: A | Discharge status: Home / Self Care | Payer: Medicare Other | Attending: Emergency Medicine | Admitting: Emergency Medicine

## 2015-02-18 ENCOUNTER — Emergency Department (HOSPITAL_COMMUNITY): Payer: Medicare Other

## 2015-02-18 DIAGNOSIS — J449 Chronic obstructive pulmonary disease, unspecified: Secondary | ICD-10-CM | POA: Diagnosis not present

## 2015-02-18 DIAGNOSIS — F17210 Nicotine dependence, cigarettes, uncomplicated: Secondary | ICD-10-CM | POA: Insufficient documentation

## 2015-02-18 DIAGNOSIS — Y998 Other external cause status: Secondary | ICD-10-CM | POA: Insufficient documentation

## 2015-02-18 DIAGNOSIS — I10 Essential (primary) hypertension: Secondary | ICD-10-CM | POA: Diagnosis not present

## 2015-02-18 DIAGNOSIS — S5001XA Contusion of right elbow, initial encounter: Secondary | ICD-10-CM | POA: Diagnosis not present

## 2015-02-18 DIAGNOSIS — I509 Heart failure, unspecified: Secondary | ICD-10-CM | POA: Diagnosis not present

## 2015-02-18 DIAGNOSIS — M7989 Other specified soft tissue disorders: Secondary | ICD-10-CM | POA: Diagnosis not present

## 2015-02-18 DIAGNOSIS — Y9289 Other specified places as the place of occurrence of the external cause: Secondary | ICD-10-CM | POA: Insufficient documentation

## 2015-02-18 DIAGNOSIS — E119 Type 2 diabetes mellitus without complications: Secondary | ICD-10-CM | POA: Insufficient documentation

## 2015-02-18 DIAGNOSIS — Z9911 Dependence on respirator [ventilator] status: Secondary | ICD-10-CM | POA: Diagnosis not present

## 2015-02-18 DIAGNOSIS — Z79899 Other long term (current) drug therapy: Secondary | ICD-10-CM | POA: Diagnosis not present

## 2015-02-18 DIAGNOSIS — W01198A Fall on same level from slipping, tripping and stumbling with subsequent striking against other object, initial encounter: Secondary | ICD-10-CM | POA: Diagnosis not present

## 2015-02-18 DIAGNOSIS — Y9301 Activity, walking, marching and hiking: Secondary | ICD-10-CM | POA: Diagnosis not present

## 2015-02-18 DIAGNOSIS — Z8669 Personal history of other diseases of the nervous system and sense organs: Secondary | ICD-10-CM | POA: Diagnosis not present

## 2015-02-18 DIAGNOSIS — S59901A Unspecified injury of right elbow, initial encounter: Secondary | ICD-10-CM | POA: Diagnosis present

## 2015-02-18 DIAGNOSIS — E669 Obesity, unspecified: Secondary | ICD-10-CM | POA: Diagnosis not present

## 2015-02-18 DIAGNOSIS — S40021A Contusion of right upper arm, initial encounter: Secondary | ICD-10-CM

## 2015-02-18 DIAGNOSIS — Z872 Personal history of diseases of the skin and subcutaneous tissue: Secondary | ICD-10-CM | POA: Insufficient documentation

## 2015-02-18 MED ORDER — HYDROCODONE-ACETAMINOPHEN 5-325 MG PO TABS
1.0000 | ORAL_TABLET | Freq: Once | ORAL | Status: AC
  Administered 2015-02-18: 1 via ORAL
  Filled 2015-02-18: qty 1

## 2015-02-18 NOTE — Discharge Instructions (Signed)

## 2015-02-18 NOTE — ED Notes (Signed)
[place] steps on [place]. States right wrist and right elbow are hurting 7/10-- swelling to right elbow area. Pt has a trach-- requesting it to be suctioned. Thick secretions -- white -- suctioned. Pt has black thick material built up around plate of trach-- states has an appt with Dr. Erik Obey on Tuesday for same. Pt changed inner cannula of trach while resp therapy at bedside.

## 2015-02-18 NOTE — Progress Notes (Signed)
Orthopedic Tech Progress Note Patient Details:  [name] [birth year] [id number]  Ortho Devices Type of Ortho Device: Arm sling Ortho Device/Splint Interventions: Application   [place] Pink 02/18/2015, 12:33 PM

## 2015-02-18 NOTE — ED Provider Notes (Signed)
CSN: [id number]     Arrival date & time 02/18/15  3818 History   First MD Initiated Contact with Patient 02/18/15 1006     Chief Complaint  Patient presents with  . Fall     (Consider location/radiation/quality/duration/timing/severity/associated sxs/prior Treatment) HPI   50 year old male with right elbow pain. Patient reports she was walking up the steps on the city bus when he lost his balance and fell forward. He struck his right elbow. His had persistent pain since then. Presenting today because he feels like it should be improving more than it is. Denies any significant acute pain anywhere else. No numbness or tingling. Has not tried taking anything for his pain.  Past Medical History  Diagnosis Date  . Ventilator dependent ([place])   . Sleep apnea Dx Oct 2011  . Hypertension Dx Oct 2012  . CHF (congestive heart failure) [facility]) Dx Oct 2012  . COPD (chronic obstructive pulmonary disease) ([place]) Dx 2013  . Diabetes mellitus without complication [facility]) Dx Nov 2015   Past Surgical History  Procedure Laterality Date  . Wound debridement  12/21/2010    Procedure: DEBRIDEMENT WOUND;  Surgeon: Harl Bowie, MD;  Location: [facility] OR;  Service: General;  Laterality: N/A;  Sacral  . Tracheostomy tube placement     Family History  Problem Relation Age of Onset  . Cancer Brother   . Chronic Renal Failure Sister    Social History  Substance Use Topics  . Smoking status: Current Every Day Smoker    Types: Cigarettes  . Smokeless tobacco: None  . Alcohol Use: No    Review of Systems  All systems reviewed and negative, other than as noted in HPI.   Allergies  Tramadol; Ibuprofen; and Robaxin  Home Medications   Prior to Admission medications   Medication Sig Start Date End Date Taking? Authorizing Provider  ACCU-CHEK FASTCLIX LANCETS MISC 1 each by Does not apply route 3 (three) times daily. E11.9 05/02/14   Josalyn Funches, MD  acetaminophen-codeine (TYLENOL #3) 300-30 MG  tablet Take 1 tablet by mouth 2 (two) times daily as needed for moderate pain. 11/09/14   Josalyn Funches, MD  albuterol (PROVENTIL) (2.5 MG/3ML) 0.083% nebulizer solution Take 2.5 mg by nebulization every 6 (six) hours as needed for wheezing or shortness of breath.    Historical Provider, MD  alum & mag hydroxide-simeth (MAALOX/MYLANTA) 200-200-20 MG/5ML suspension Take 30 mLs by mouth every 6 (six) hours as needed for indigestion or heartburn (dyspepsia). 12/02/13   Robbie Lis, MD  antiseptic oral rinse (CPC / CETYLPYRIDINIUM CHLORIDE 0.05%) 0.05 % LIQD solution 7 mLs by Mouth Rinse route 2 times daily at 12 noon and 4 pm. 12/02/13   Robbie Lis, MD  atenolol (TENORMIN) 25 MG tablet Take 1 tablet (25 mg total) by mouth daily. 08/04/14   Dorothy Spark, MD  atorvastatin (LIPITOR) 20 MG tablet Take 1 tablet (20 mg total) by mouth daily. 05/05/14   Josalyn Funches, MD  Blood Glucose Monitoring Suppl (ACCU-CHEK NANO SMARTVIEW) W/DEVICE KIT 1 Device by Does not apply route as needed. E 11.9 05/02/14   Josalyn Funches, MD  cefdinir (OMNICEF) 300 MG capsule Take 1 capsule (300 mg total) by mouth 2 (two) times daily. 01/26/15   Janne Napoleon, NP  furosemide (LASIX) 40 MG tablet Take 1 tablet (40 mg total) by mouth 2 (two) times daily. Take 1 tab in the morning and 1 tab at 2 pm 01/20/15   Dorothy Spark, MD  glucose blood (ACCU-CHEK SMARTVIEW) test strip 1 each by Other route 3 (three) times daily. E11.9 05/02/14   Boykin Nearing, MD  HYDROcodone-acetaminophen (NORCO/VICODIN) 5-325 MG per tablet Take 1 tablet by mouth every 8 (eight) hours as needed for moderate pain.  06/22/14   Historical Provider, MD  losartan (COZAAR) 100 MG tablet Take 1 tablet (100 mg total) by mouth daily. 05/02/14   Boykin Nearing, MD  metFORMIN (GLUCOPHAGE) 1000 MG tablet Take 1 tablet (1,000 mg total) by mouth 2 (two) times daily with a meal. 05/02/14   Josalyn Funches, MD  polyethylene glycol (MIRALAX / GLYCOLAX) packet Take 17 g  by mouth 2 (two) times daily as needed for mild constipation.    Historical Provider, MD  potassium chloride SA (K-DUR,KLOR-CON) 20 MEQ tablet Take 1 tablet (20 mEq total) by mouth daily. 08/04/14   Dorothy Spark, MD   BP 122/82 mmHg  Pulse 100  Temp(Src) 98.4 F (36.9 C) (Oral)  Resp 26  Ht 5' 6"  (1.676 m)  Wt 415 lb (188.243 kg)  BMI 67.01 kg/m2  SpO2 93% Physical Exam  Constitutional: He appears well-developed and well-nourished. No distress.  Sitting on edge of bed. Obese. No acute distress.  HENT:  Head: Normocephalic and atraumatic.  Trach  Eyes: Conjunctivae are normal. Right eye exhibits no discharge. Left eye exhibits no discharge.  Neck: Neck supple.  Cardiovascular: Normal rate, regular rhythm and normal heart sounds.  Exam reveals no gallop and no friction rub.   No murmur heard. Pulmonary/Chest: Effort normal and breath sounds normal. No respiratory distress.  Abdominal: Soft. He exhibits no distension. There is no tenderness.  Musculoskeletal: He exhibits no edema.  Exam is somewhat limited by body habitus. Right elbow generally appears normal to inspection though. Symmetric as compared to left. Palpation over the olecranon. Patient can fully flex and extend at the elbow actively although he does report some increased pain with and extension. Does not seem sniffily worse with pronation/supination. No concerning skin changes. Neurovascular intact distally.  Neurological: He is alert.  Skin: Skin is warm and dry.  Psychiatric: He has a normal mood and affect. His behavior is normal. Thought content normal.  Nursing note and vitals reviewed.   ED Course  Procedures (including critical care time) Labs Review Labs Reviewed - No data to display  Imaging Review No results found. I have personally reviewed and evaluated these images and lab results as part of my medical decision-making.   EKG Interpretation None      MDM   Final diagnoses:  Contusion, upper  extremity, right, initial encounter     49yM with R elbow pain after fall. Some tenderness to palpation over the right elbow. Patient can fully extend his elbow actively. Although he does report some increased pain. He is neurovascularly intact. Homogeneous without acute abnormality. Likely contusion. Plan symptomatic treatment. Return precautions were discussed. Sling for comfort.    Virgel Manifold, MD 03/01/15 [phone]

## 2015-02-21 ENCOUNTER — Ambulatory Visit: Payer: Medicare Other | Admitting: Family Medicine

## 2015-02-21 ENCOUNTER — Encounter: Payer: Self-pay | Admitting: Family Medicine

## 2015-02-21 DIAGNOSIS — E662 Morbid (severe) obesity with alveolar hypoventilation: Secondary | ICD-10-CM | POA: Diagnosis not present

## 2015-02-21 DIAGNOSIS — Z430 Encounter for attention to tracheostomy: Secondary | ICD-10-CM | POA: Diagnosis not present

## 2015-02-21 DIAGNOSIS — Z930 Tracheostomy status: Secondary | ICD-10-CM | POA: Diagnosis not present

## 2015-02-23 ENCOUNTER — Telehealth: Payer: Self-pay

## 2015-02-23 NOTE — Telephone Encounter (Signed)
[place] office staff contacted nurse regarding follow up appointment for patient. Patient reported falling on the bus and going to ED to front office staff. Nurse spoke with Dr. Adrian Blackwater. Per Dr. Adrian Blackwater patient does need follow up appointment for ED visit regarding falls.  Nurse called patient, reached voicemail. Left message for patient to call [name] with [facility], at [phone]. Nurse called to check on patient and schedule appointment for Ed follow up.

## 2015-02-24 NOTE — Telephone Encounter (Signed)
Nurse called patient, patient verified date of birth. Patient has appointment on March 10, 2015 with Dr. Adrian Blackwater at 10:00 am.  Patient does not express any needs at this time.

## 2015-03-10 ENCOUNTER — Encounter: Payer: Self-pay | Admitting: Family Medicine

## 2015-03-10 ENCOUNTER — Ambulatory Visit: Payer: Medicare Other | Attending: Family Medicine | Admitting: Family Medicine

## 2015-03-10 VITALS — BP 123/75 | HR 105 | Temp 98.6°F | Resp 16 | Ht 67.0 in | Wt >= 6400 oz

## 2015-03-10 DIAGNOSIS — E662 Morbid (severe) obesity with alveolar hypoventilation: Secondary | ICD-10-CM | POA: Insufficient documentation

## 2015-03-10 DIAGNOSIS — E119 Type 2 diabetes mellitus without complications: Secondary | ICD-10-CM | POA: Diagnosis not present

## 2015-03-10 DIAGNOSIS — Z6841 Body Mass Index (BMI) 40.0 and over, adult: Secondary | ICD-10-CM | POA: Diagnosis not present

## 2015-03-10 DIAGNOSIS — J449 Chronic obstructive pulmonary disease, unspecified: Secondary | ICD-10-CM | POA: Insufficient documentation

## 2015-03-10 DIAGNOSIS — F17200 Nicotine dependence, unspecified, uncomplicated: Secondary | ICD-10-CM | POA: Insufficient documentation

## 2015-03-10 DIAGNOSIS — S60211A Contusion of right wrist, initial encounter: Secondary | ICD-10-CM | POA: Insufficient documentation

## 2015-03-10 DIAGNOSIS — S5001XA Contusion of right elbow, initial encounter: Secondary | ICD-10-CM | POA: Insufficient documentation

## 2015-03-10 DIAGNOSIS — S5001XD Contusion of right elbow, subsequent encounter: Secondary | ICD-10-CM | POA: Diagnosis not present

## 2015-03-10 DIAGNOSIS — S60211D Contusion of right wrist, subsequent encounter: Secondary | ICD-10-CM

## 2015-03-10 DIAGNOSIS — Z7984 Long term (current) use of oral hypoglycemic drugs: Secondary | ICD-10-CM | POA: Insufficient documentation

## 2015-03-10 DIAGNOSIS — E118 Type 2 diabetes mellitus with unspecified complications: Secondary | ICD-10-CM | POA: Diagnosis not present

## 2015-03-10 DIAGNOSIS — Z79899 Other long term (current) drug therapy: Secondary | ICD-10-CM | POA: Diagnosis not present

## 2015-03-10 DIAGNOSIS — Z930 Tracheostomy status: Secondary | ICD-10-CM | POA: Insufficient documentation

## 2015-03-10 DIAGNOSIS — L89153 Pressure ulcer of sacral region, stage 3: Secondary | ICD-10-CM | POA: Diagnosis not present

## 2015-03-10 DIAGNOSIS — L89159 Pressure ulcer of sacral region, unspecified stage: Secondary | ICD-10-CM | POA: Diagnosis not present

## 2015-03-10 LAB — GLUCOSE, POCT (MANUAL RESULT ENTRY): POC GLUCOSE: 187 mg/dL — AB (ref 70–99)

## 2015-03-10 LAB — POCT GLYCOSYLATED HEMOGLOBIN (HGB A1C): HEMOGLOBIN A1C: 8.1

## 2015-03-10 MED ORDER — SITAGLIPTIN PHOSPHATE 100 MG PO TABS: 100.0000 mg | ORAL_TABLET | Freq: Every day | ORAL | Status: DC

## 2015-03-10 MED ORDER — ACETAMINOPHEN-CODEINE #3 300-30 MG PO TABS: 1.0000 | ORAL_TABLET | Freq: Three times a day (TID) | ORAL | Status: DC | PRN

## 2015-03-10 MED ORDER — ALBUTEROL SULFATE (2.5 MG/3ML) 0.083% IN NEBU: 2.5000 mg | INHALATION_SOLUTION | Freq: Four times a day (QID) | RESPIRATORY_TRACT | Status: AC | PRN

## 2015-03-10 NOTE — Progress Notes (Signed)
Subjective:  Patient ID: [name], male    DOB: [birth year]  Age: 50 y.o. MRN: [id number]  CC: Hospitalization Follow-up and Diabetes   HPI [name]  Has DM2, COPD, obesity hypoventilation, trach dependent    1. ED f/u contusion: patient seen in ED following fall while on the city bus. He fell onto his outstretched R arm. He landed on his R radial wrist and elbow. He still has pain in these area. No bruising or swelling. Pain is worse with movement. He is R handed. X-rays done in ED were negative for fracture.    2. CHRONIC DIABETES  Disease Monitoring  Blood Sugar Ranges: not checking   Polyuria: no   Visual problems: no   Medication Compliance: yes  Medication Side Effects  Hypoglycemia: no   Preventitive Health Care  Eye Exam: due   Diet pattern: eating ramen noodles, cannot afford food, not eligible for food stamps, gets food from food pantry   Exercise: minimal   3. Requesting DME: requesting new nebulizer machine and trach suction machine. He gets DME for [place] care currently (adult diapers).   Social History  Substance Use Topics  . Smoking status: Current Every Day Smoker    Types: Cigarettes  . Smokeless tobacco: Not on file  . Alcohol Use: No     Outpatient Prescriptions Prior to Visit  Medication Sig Dispense Refill  . ACCU-CHEK FASTCLIX LANCETS MISC 1 each by Does not apply route 3 (three) times daily. E11.9 102 each 11  . acetaminophen-codeine (TYLENOL #3) 300-30 MG tablet Take 1 tablet by mouth 2 (two) times daily as needed for moderate pain. 60 tablet 2  . albuterol (PROVENTIL) (2.5 MG/3ML) 0.083% nebulizer solution Take 2.5 mg by nebulization every 6 (six) hours as needed for wheezing or shortness of breath.    Marland Kitchen alum & mag hydroxide-simeth (MAALOX/MYLANTA) 200-200-20 MG/5ML suspension Take 30 mLs by mouth every 6 (six) hours as needed for indigestion or heartburn (dyspepsia). 355 mL 0  . atenolol (TENORMIN) 25 MG tablet Take 1  tablet (25 mg total) by mouth daily. 90 tablet 3  . atorvastatin (LIPITOR) 20 MG tablet Take 1 tablet (20 mg total) by mouth daily. 30 tablet 11  . Blood Glucose Monitoring Suppl (ACCU-CHEK NANO SMARTVIEW) W/DEVICE KIT 1 Device by Does not apply route as needed. E 11.9 1 kit 0  . furosemide (LASIX) 40 MG tablet Take 1 tablet (40 mg total) by mouth 2 (two) times daily. Take 1 tab in the morning and 1 tab at 2 pm 180 tablet 11  . glucose blood (ACCU-CHEK SMARTVIEW) test strip 1 each by Other route 3 (three) times daily. E11.9 100 each 11  . HYDROcodone-acetaminophen (NORCO/VICODIN) 5-325 MG per tablet Take 1 tablet by mouth every 8 (eight) hours as needed for moderate pain.     Marland Kitchen losartan (COZAAR) 100 MG tablet Take 1 tablet (100 mg total) by mouth daily. 90 tablet 3  . metFORMIN (GLUCOPHAGE) 1000 MG tablet Take 1 tablet (1,000 mg total) by mouth 2 (two) times daily with a meal. 180 tablet 3  . polyethylene glycol (MIRALAX / GLYCOLAX) packet Take 17 g by mouth 2 (two) times daily as needed for mild constipation.    . potassium chloride SA (K-DUR,KLOR-CON) 20 MEQ tablet Take 1 tablet (20 mEq total) by mouth daily. 90 tablet 3  . antiseptic oral rinse (CPC / CETYLPYRIDINIUM CHLORIDE 0.05%) 0.05 % LIQD solution 7 mLs by Mouth Rinse route 2 times  daily at 12 noon and 4 pm. (Patient not taking: Reported on 03/10/2015) 44 mL 0  . cefdinir (OMNICEF) 300 MG capsule Take 1 capsule (300 mg total) by mouth 2 (two) times daily. (Patient not taking: Reported on 03/10/2015) 14 capsule 0   No facility-administered medications prior to visit.    ROS Review of Systems  Constitutional: Negative for fever, chills, fatigue and unexpected weight change.  Eyes: Negative for visual disturbance.  Respiratory: Negative for cough and shortness of breath.   Cardiovascular: Negative for chest pain, palpitations and leg swelling.  Gastrointestinal: Negative for nausea, vomiting, abdominal pain, diarrhea, constipation and blood  in stool.  Endocrine: Negative for polydipsia, polyphagia and polyuria.  Musculoskeletal: Positive for arthralgias. Negative for myalgias, back pain, gait problem and neck pain.  Skin: Positive for wound (chronic sacarl ulcer ). Negative for rash.  Allergic/Immunologic: Negative for immunocompromised state.  Hematological: Negative for adenopathy. Does not bruise/bleed easily.  Psychiatric/Behavioral: Negative for suicidal ideas, sleep disturbance and dysphoric mood. The patient is not nervous/anxious.     Objective:  BP 123/75 mmHg  Pulse 105  Temp(Src) 98.6 F (37 C) (Oral)  Resp 16  Ht 5' 7"  (1.702 m)  Wt 405 lb (183.707 kg)  BMI 63.42 kg/m2  SpO2 94%  BP/Weight 03/10/2015 02/18/2015 20/80/2233  Systolic BP 612 244 975  Diastolic BP 75 82 80  Wt. (Lbs) 405 415 -  BMI 63.42 67.01 -    Physical Exam  Constitutional: He appears well-developed and well-nourished. No distress.  Obese   HENT:  Head: Normocephalic and atraumatic.  Neck: Normal range of motion. Neck supple.    Cardiovascular: Normal rate, regular rhythm, normal heart sounds and intact distal pulses.   Pulmonary/Chest: Effort normal and breath sounds normal.  Musculoskeletal: He exhibits no edema.       Right elbow: He exhibits normal range of motion, no swelling, no effusion, no deformity and no laceration. Tenderness found. Radial head tenderness noted. No medial epicondyle, no lateral epicondyle and no olecranon process tenderness noted.       Right wrist: He exhibits tenderness and bony tenderness. He exhibits normal range of motion, no swelling, no effusion, no crepitus, no deformity and no laceration.       Arms: Neurological: He is alert.  Skin: Skin is warm and dry. No rash noted. No erythema.  Psychiatric: He has a normal mood and affect.   Lab Results  Component Value Date   HGBA1C 8.10 03/10/2015   CBG 187   Assessment & Plan:   [name] was seen today for hospitalization follow-up and  diabetes.  Diagnoses and all orders for this visit:  Type 2 diabetes mellitus with complication, without long-term current use of insulin (HCC) -     HgB A1c -     Glucose (CBG) -     sitaGLIPtin (JANUVIA) 100 MG tablet; Take 1 tablet (100 mg total) by mouth daily.  Decubitus ulcer of sacral area, stage III (HCC) -     acetaminophen-codeine (TYLENOL #3) 300-30 MG tablet; Take 1 tablet by mouth every 8 (eight) hours as needed for moderate pain.  Contusion of wrist, right, subsequent encounter -     acetaminophen-codeine (TYLENOL #3) 300-30 MG tablet; Take 1 tablet by mouth every 8 (eight) hours as needed for moderate pain. -     DG Wrist Navic Only Right; Future -     Ambulatory referral to Home Health  Contusion of elbow, right, subsequent encounter -  acetaminophen-codeine (TYLENOL #3) 300-30 MG tablet; Take 1 tablet by mouth every 8 (eight) hours as needed for moderate pain. -     Ambulatory referral to Home Health  Obesity hypoventilation syndrome (HCC) -     albuterol (PROVENTIL) (2.5 MG/3ML) 0.083% nebulizer solution; Take 3 mLs (2.5 mg total) by nebulization every 6 (six) hours as needed for wheezing or shortness of breath. -     Ambulatory referral to [facility]  Tracheostomy dependent [facility])    Hand written scripts for nebulizer machine and tracheostomy suction machine with tubing faxed to [facility]   Follow-up: No Follow-up on file.   Boykin Nearing MD

## 2015-03-10 NOTE — Assessment & Plan Note (Signed)
There is tenderness w/o deformity Repeat x-ray ordered to r/o scaphoid fracture Tylenol #3 for pain control

## 2015-03-10 NOTE — Assessment & Plan Note (Signed)
A; declined with rise in A1c P: Add januvia 100 mg daily Continue metformin 1000 mg BID Emphasized low carb diet as able

## 2015-03-10 NOTE — Patient Instructions (Addendum)
[name] was seen today for hospitalization follow-up and diabetes.  Diagnoses and all orders for this visit:  Type 2 diabetes mellitus with complication, without long-term current use of insulin (HCC) -     HgB A1c -     Glucose (CBG) -     sitaGLIPtin (JANUVIA) 100 MG tablet; Take 1 tablet (100 mg total) by mouth daily.  Decubitus ulcer of sacral area, stage III (HCC) -     acetaminophen-codeine (TYLENOL #3) 300-30 MG tablet; Take 1 tablet by mouth every 8 (eight) hours as needed for moderate pain.  Contusion of wrist, right, subsequent encounter -     acetaminophen-codeine (TYLENOL #3) 300-30 MG tablet; Take 1 tablet by mouth every 8 (eight) hours as needed for moderate pain. -     DG Wrist Navic Only Right; Future -     Ambulatory referral to Home Health  Contusion of elbow, right, subsequent encounter -     acetaminophen-codeine (TYLENOL #3) 300-30 MG tablet; Take 1 tablet by mouth every 8 (eight) hours as needed for moderate pain. -     Ambulatory referral to Home Health  Obesity hypoventilation syndrome (HCC) -     albuterol (PROVENTIL) (2.5 MG/3ML) 0.083% nebulizer solution; Take 3 mLs (2.5 mg total) by nebulization every 6 (six) hours as needed for wheezing or shortness of breath. -     Ambulatory referral to [place]   Please have x-ray done at earliest convenience Home health ordered Nebulizer machine and suction will [facility]   You will be called with results. You can also set up mychart.  F/u in 3 months   Dr. Adrian Blackwater

## 2015-03-10 NOTE — Progress Notes (Signed)
HFU elbow and wrist pain due to injury  Pain scale #8 Tobacco user 3 cigarette per day  No suicidal thought in the past two weeks

## 2015-03-10 NOTE — Assessment & Plan Note (Signed)
  Hand written scripts for tracheostomy suction machine with tubing faxed to [place]

## 2015-03-22 ENCOUNTER — Telehealth: Payer: Self-pay | Admitting: Family Medicine

## 2015-03-22 NOTE — Telephone Encounter (Signed)
Pt. Needs to know if his suction machine to be ordered, his nebulizer was ordered  .Marland Kitchen..please follow up with patient

## 2015-03-23 NOTE — Telephone Encounter (Signed)
Both were ordered on the same day, patient's last OV,  and orders sent to [facility].  Junious Dresser, please call [facility] to inquire.

## 2015-03-29 NOTE — Telephone Encounter (Signed)
Call [facility] spoke with [facility]  Per [place] Rx was received  Nebulizer was deliver to pt  Suction machine was Rx to him in [employer] will not pay new one till 2019  If Machine not working pt need to call [facility] for service   Pt notified,  Pt rude and hung the phone before given [facility] phone number.

## 2015-04-20 DIAGNOSIS — E662 Morbid (severe) obesity with alveolar hypoventilation: Secondary | ICD-10-CM | POA: Diagnosis not present

## 2015-04-20 DIAGNOSIS — Z930 Tracheostomy status: Secondary | ICD-10-CM | POA: Diagnosis not present

## 2015-04-20 DIAGNOSIS — Z430 Encounter for attention to tracheostomy: Secondary | ICD-10-CM | POA: Diagnosis not present

## 2015-06-06 DIAGNOSIS — Z430 Encounter for attention to tracheostomy: Secondary | ICD-10-CM | POA: Diagnosis not present

## 2015-07-25 ENCOUNTER — Encounter: Payer: Self-pay | Admitting: Family Medicine

## 2015-07-25 ENCOUNTER — Ambulatory Visit: Payer: Medicare Other | Attending: Family Medicine | Admitting: Family Medicine

## 2015-07-25 VITALS — BP 143/81 | HR 92 | Temp 99.1°F | Resp 20 | Ht 67.0 in | Wt >= 6400 oz

## 2015-07-25 DIAGNOSIS — G8929 Other chronic pain: Secondary | ICD-10-CM

## 2015-07-25 DIAGNOSIS — Z7984 Long term (current) use of oral hypoglycemic drugs: Secondary | ICD-10-CM | POA: Diagnosis not present

## 2015-07-25 DIAGNOSIS — I110 Hypertensive heart disease with heart failure: Secondary | ICD-10-CM | POA: Insufficient documentation

## 2015-07-25 DIAGNOSIS — E118 Type 2 diabetes mellitus with unspecified complications: Secondary | ICD-10-CM | POA: Diagnosis not present

## 2015-07-25 DIAGNOSIS — L89153 Pressure ulcer of sacral region, stage 3: Secondary | ICD-10-CM | POA: Insufficient documentation

## 2015-07-25 DIAGNOSIS — M25561 Pain in right knee: Secondary | ICD-10-CM | POA: Insufficient documentation

## 2015-07-25 DIAGNOSIS — Z791 Long term (current) use of non-steroidal anti-inflammatories (NSAID): Secondary | ICD-10-CM | POA: Insufficient documentation

## 2015-07-25 DIAGNOSIS — F17200 Nicotine dependence, unspecified, uncomplicated: Secondary | ICD-10-CM | POA: Insufficient documentation

## 2015-07-25 DIAGNOSIS — I509 Heart failure, unspecified: Secondary | ICD-10-CM | POA: Insufficient documentation

## 2015-07-25 DIAGNOSIS — Z79899 Other long term (current) drug therapy: Secondary | ICD-10-CM | POA: Diagnosis not present

## 2015-07-25 DIAGNOSIS — J449 Chronic obstructive pulmonary disease, unspecified: Secondary | ICD-10-CM | POA: Insufficient documentation

## 2015-07-25 DIAGNOSIS — M25562 Pain in left knee: Secondary | ICD-10-CM | POA: Insufficient documentation

## 2015-07-25 LAB — GLUCOSE, POCT (MANUAL RESULT ENTRY): POC GLUCOSE: 145 mg/dL — AB (ref 70–99)

## 2015-07-25 MED ORDER — METHYLPREDNISOLONE ACETATE 40 MG/ML IJ SUSP: 40.0000 mg | Freq: Once | INTRAMUSCULAR | Status: DC

## 2015-07-25 MED ORDER — METHYLPREDNISOLONE ACETATE 40 MG/ML IJ SUSP
40.0000 mg | Freq: Once | INTRAMUSCULAR | Status: AC
  Administered 2015-07-25: 40 mg via INTRA_ARTICULAR

## 2015-07-25 MED ORDER — ACETAMINOPHEN-CODEINE #3 300-30 MG PO TABS: 1.0000 | ORAL_TABLET | Freq: Three times a day (TID) | ORAL | Status: DC | PRN

## 2015-07-25 NOTE — Patient Instructions (Addendum)
[name] was seen today for knee pain.  Diagnoses and all orders for this visit:  Type 2 diabetes mellitus with complication, without long-term current use of insulin (HCC) -     POCT glycosylated hemoglobin (Hb A1C) -     POCT glucose (manual entry)  Bilateral chronic knee pain -     DG Knee AP/LAT W/Sunrise Left; Future -     DG Knee AP/LAT W/Sunrise Right; Future -     methylPREDNISolone acetate (DEPO-MEDROL) injection 40 mg; Inject 1 mL (40 mg total) into the articular space once. -     methylPREDNISolone acetate (DEPO-MEDROL) injection 40 mg; Inject 1 mL (40 mg total) into the articular space once. -     acetaminophen-codeine (TYLENOL #3) 300-30 MG tablet; Take 1 tablet by mouth every 8 (eight) hours as needed for moderate pain.  Decubitus ulcer of sacral area, stage III (HCC) -     acetaminophen-codeine (TYLENOL #3) 300-30 MG tablet; Take 1 tablet by mouth every 8 (eight) hours as needed for moderate pain.   You have received a shot of steroid in your joint today. Rest and ice knee today. Regular activity tomorrow. Look out for redness, swelling, fever,severe pain in joint and call if you experience these symptoms.  Please sign in to mychart to give an update regarding response to injection   F/u in 2 weeks for L knee injection   Dr. Adrian Blackwater   Generic Knee Exercises EXERCISES RANGE OF MOTION (ROM) AND STRETCHING EXERCISES These exercises may help you when beginning to rehabilitate your injury. Your symptoms may resolve with or without further involvement from your physician, physical therapist, or athletic trainer. While completing these exercises, remember:   Restoring tissue flexibility helps normal motion to return to the joints. This allows healthier, less painful movement and activity.  An effective stretch should be held for at least 30 seconds.  A stretch should never be painful. You should only feel a gentle lengthening or release in the stretched tissue. STRETCH -  Knee Extension, Prone  Lie on your stomach on a firm surface, such as a bed or countertop. Place your right / left knee and leg just beyond the edge of the surface. You may wish to place a towel under the far end of your right / left thigh for comfort.  Relax your leg muscles and allow gravity to straighten your knee. Your clinician may advise you to add an ankle weight if more resistance is helpful for you.  You should feel a stretch in the back of your right / left knee. Hold this position for __________ seconds. Repeat __________ times. Complete this stretch __________ times per day. * Your physician, physical therapist, or athletic trainer may ask you to add ankle weight to enhance your stretch.  RANGE OF MOTION - Knee Flexion, Active  Lie on your back with both knees straight. (If this causes back discomfort, bend your opposite knee, placing your foot flat on the floor.)  Slowly slide your heel back toward your buttocks until you feel a gentle stretch in the front of your knee or thigh.  Hold for __________ seconds. Slowly slide your heel back to the starting position. Repeat __________ times. Complete this exercise __________ times per day.  STRETCH - Quadriceps, Prone   Lie on your stomach on a firm surface, such as a bed or padded floor.  Bend your right / left knee and grasp your ankle. If you are unable to reach your ankle or pant leg,  use a belt around your foot to lengthen your reach.  Gently pull your heel toward your buttocks. Your knee should not slide out to the side. You should feel a stretch in the front of your thigh and/or knee.  Hold this position for __________ seconds. Repeat __________ times. Complete this stretch __________ times per day.  STRETCH - Hamstrings, Supine   Lie on your back. Loop a belt or towel over the ball of your right / left foot.  Straighten your right / left knee and slowly pull on the belt to raise your leg. Do not allow the right / left  knee to bend. Keep your opposite leg flat on the floor.  Raise the leg until you feel a gentle stretch behind your right / left knee or thigh. Hold this position for __________ seconds. Repeat __________ times. Complete this stretch __________ times per day.  STRENGTHENING EXERCISES These exercises may help you when beginning to rehabilitate your injury. They may resolve your symptoms with or without further involvement from your physician, physical therapist, or athletic trainer. While completing these exercises, remember:   Muscles can gain both the endurance and the strength needed for everyday activities through controlled exercises.  Complete these exercises as instructed by your physician, physical therapist, or athletic trainer. Progress the resistance and repetitions only as guided.  You may experience muscle soreness or fatigue, but the pain or discomfort you are trying to eliminate should never worsen during these exercises. If this pain does worsen, stop and make certain you are following the directions exactly. If the pain is still present after adjustments, discontinue the exercise until you can discuss the trouble with your clinician. STRENGTH - Quadriceps, Isometrics  Lie on your back with your right / left leg extended and your opposite knee bent.  Gradually tense the muscles in the front of your right / left thigh. You should see either your knee cap slide up toward your hip or increased dimpling just above the knee. This motion will push the back of the knee down toward the floor/mat/bed on which you are lying.  Hold the muscle as tight as you can without increasing your pain for __________ seconds.  Relax the muscles slowly and completely in between each repetition. Repeat __________ times. Complete this exercise __________ times per day.  STRENGTH - Quadriceps, Short Arcs   Lie on your back. Place a __________ inch towel roll under your knee so that the knee slightly  bends.  Raise only your lower leg by tightening the muscles in the front of your thigh. Do not allow your thigh to rise.  Hold this position for __________ seconds. Repeat __________ times. Complete this exercise __________ times per day.  OPTIONAL ANKLE WEIGHTS: Begin with ____________________, but DO NOT exceed ____________________. Increase in 1 pound/0.5 kilogram increments.  STRENGTH - Quadriceps, Straight Leg Raises  Quality counts! Watch for signs that the quadriceps muscle is working to insure you are strengthening the correct muscles and not "cheating" by substituting with healthier muscles.  Lay on your back with your right / left leg extended and your opposite knee bent.  Tense the muscles in the front of your right / left thigh. You should see either your knee cap slide up or increased dimpling just above the knee. Your thigh may even quiver.  Tighten these muscles even more and raise your leg 4 to 6 inches off the floor. Hold for __________ seconds.  Keeping these muscles tense, lower your leg.  Relax  the muscles slowly and completely in between each repetition. Repeat __________ times. Complete this exercise __________ times per day.  STRENGTH - Hamstring, Curls  Lay on your stomach with your legs extended. (If you lay on a bed, your feet may hang over the edge.)  Tighten the muscles in the back of your thigh to bend your right / left knee up to 90 degrees. Keep your hips flat on the bed/floor.  Hold this position for __________ seconds.  Slowly lower your leg back to the starting position. Repeat __________ times. Complete this exercise __________ times per day.  OPTIONAL ANKLE WEIGHTS: Begin with ____________________, but DO NOT exceed ____________________. Increase in 1 pound/0.5 kilogram increments.  STRENGTH - Quadriceps, Squats  Stand in a door frame so that your feet and knees are in line with the frame.  Use your hands for balance, not support, on the  frame.  Slowly lower your weight, bending at the hips and knees. Keep your lower legs upright so that they are parallel with the door frame. Squat only within the range that does not increase your knee pain. Never let your hips drop below your knees.  Slowly return upright, pushing with your legs, not pulling with your hands. Repeat __________ times. Complete this exercise __________ times per day.  STRENGTH - Quadriceps, Wall Slides  Follow guidelines for form closely. Increased knee pain often results from poorly placed feet or knees.  Lean against a smooth wall or door and walk your feet out 18-24 inches. Place your feet hip-width apart.  Slowly slide down the wall or door until your knees bend __________ degrees.* Keep your knees over your heels, not your toes, and in line with your hips, not falling to either side.  Hold for __________ seconds. Stand up to rest for __________ seconds in between each repetition. Repeat __________ times. Complete this exercise __________ times per day. * Your physician, physical therapist, or athletic trainer will alter this angle based on your symptoms and progress.   This information is not intended to replace advice given to you by your health care provider. Make sure you discuss any questions you have with your health care provider.   Document Released: 12/05/2004 Document Revised: 02/11/2014 Document Reviewed: 05/05/2008 Elsevier Interactive Patient Education [employer].

## 2015-07-25 NOTE — Progress Notes (Signed)
C/C knee pain bilateral. Stated Lt knee gave up two weeks ago  No hx injury  Pain scale #8 No suicidal thought in the past two week Former smoker, no cigarette x 3 weeks

## 2015-07-25 NOTE — Assessment & Plan Note (Signed)
A: chronic knee pain, suspect meniscal injury P: R knee injection done today B.l knee x-ray Will f/u response in 2 weeks and likely inject L knee Tylenol #3 refilled, patient signed controlled substance contract

## 2015-07-25 NOTE — Progress Notes (Signed)
Subjective:  Patient ID: [name], male    DOB: April 13, 1965  Age: 50 y.o. MRN: [id number]  CC: Knee Pain   HPI [name] has diabetes, HTN, morbid obesity, obesity hypoventilation syndrome  presents for   1. Knee pain: bilateral knee pain.  He has a fall in 12/2014 onto his R knee. His L knee gave out 2 weeks ago. Pain is worse in his R knee. Pain in 6-7/10. Achy pain. Tylenol #3 and compression help a bit. He has mild swelling. No redness. He walks unassisted.   Past Medical History  Diagnosis Date  . Ventilator dependent ([place])   . Sleep apnea Dx Oct 2011  . Hypertension Dx Oct 2012  . CHF (congestive heart failure) [facility]) Dx Oct 2012  . COPD (chronic obstructive pulmonary disease) ([place]) Dx 2013  . Diabetes mellitus without complication [facility]) Dx Nov 2015  . Chronic ulcer of sacral region [facility]) 2012   Social History  Substance Use Topics  . Smoking status: Current Every Day Smoker    Types: Cigarettes  . Smokeless tobacco: Not on file  . Alcohol Use: No   Outpatient Prescriptions Prior to Visit  Medication Sig Dispense Refill  . ACCU-CHEK FASTCLIX LANCETS MISC 1 each by Does not apply route 3 (three) times daily. E11.9 102 each 11  . acetaminophen-codeine (TYLENOL #3) 300-30 MG tablet Take 1 tablet by mouth every 8 (eight) hours as needed for moderate pain. 90 tablet 2  . albuterol (PROVENTIL) (2.5 MG/3ML) 0.083% nebulizer solution Take 3 mLs (2.5 mg total) by nebulization every 6 (six) hours as needed for wheezing or shortness of breath. 75 mL 5  . atenolol (TENORMIN) 25 MG tablet Take 1 tablet (25 mg total) by mouth daily. 90 tablet 3  . atorvastatin (LIPITOR) 20 MG tablet Take 1 tablet (20 mg total) by mouth daily. 30 tablet 11  . Blood Glucose Monitoring Suppl (ACCU-CHEK NANO SMARTVIEW) W/DEVICE KIT 1 Device by Does not apply route as needed. E 11.9 1 kit 0  . furosemide (LASIX) 40 MG tablet Take 1 tablet (40 mg total) by mouth 2 (two) times daily. Take 1 tab in the  morning and 1 tab at 2 pm 180 tablet 11  . losartan (COZAAR) 100 MG tablet Take 1 tablet (100 mg total) by mouth daily. 90 tablet 3  . metFORMIN (GLUCOPHAGE) 1000 MG tablet Take 1 tablet (1,000 mg total) by mouth 2 (two) times daily with a meal. 180 tablet 3  . potassium chloride SA (K-DUR,KLOR-CON) 20 MEQ tablet Take 1 tablet (20 mEq total) by mouth daily. 90 tablet 3  . sitaGLIPtin (JANUVIA) 100 MG tablet Take 1 tablet (100 mg total) by mouth daily. 90 tablet 3  . alum & mag hydroxide-simeth (MAALOX/MYLANTA) 200-200-20 MG/5ML suspension Take 30 mLs by mouth every 6 (six) hours as needed for indigestion or heartburn (dyspepsia). (Patient not taking: Reported on 07/25/2015) 355 mL 0  . glucose blood (ACCU-CHEK SMARTVIEW) test strip 1 each by Other route 3 (three) times daily. E11.9 100 each 11  . polyethylene glycol (MIRALAX / GLYCOLAX) packet Take 17 g by mouth 2 (two) times daily as needed for mild constipation. Reported on 07/25/2015     No facility-administered medications prior to visit.    ROS Review of Systems  Constitutional: Negative for fever, chills, fatigue and unexpected weight change.  Eyes: Negative for visual disturbance.  Respiratory: Negative for cough and shortness of breath.   Cardiovascular: Negative for chest pain, palpitations and  leg swelling.  Gastrointestinal: Negative for nausea, vomiting, abdominal pain, diarrhea, constipation and blood in stool.  Endocrine: Negative for polydipsia, polyphagia and polyuria.  Musculoskeletal: Positive for arthralgias. Negative for myalgias, back pain, gait problem and neck pain.  Skin: Positive for wound (chronic sacarl ulcer ). Negative for rash.  Allergic/Immunologic: Negative for immunocompromised state.  Hematological: Negative for adenopathy. Does not bruise/bleed easily.  Psychiatric/Behavioral: Negative for suicidal ideas, sleep disturbance and dysphoric mood. The patient is not nervous/anxious.     Objective:  BP 143/81  mmHg  Pulse 92  Temp(Src) 99.1 F (37.3 C) (Oral)  Resp 20  Ht 5' 7"  (1.702 m)  Wt 405 lb (183.707 kg)  BMI 63.42 kg/m2  SpO2 95%  BP/Weight 07/25/2015 03/10/2015 0/76/2263  Systolic BP 335 456 256  Diastolic BP 81 75 82  Wt. (Lbs) 405 405 415  BMI 63.42 63.42 67.01   Physical Exam  Constitutional: He appears well-developed and well-nourished. No distress.  Obese   HENT:  Head: Normocephalic and atraumatic.  Neck: Normal range of motion. Neck supple.    Cardiovascular: Normal rate, regular rhythm, normal heart sounds and intact distal pulses.   Pulmonary/Chest: Effort normal and breath sounds normal.  Musculoskeletal: He exhibits no edema.       Right knee: He exhibits decreased range of motion, swelling and effusion. He exhibits no ecchymosis, no deformity, no laceration, no erythema, normal alignment, no LCL laxity and normal patellar mobility. Tenderness found. Medial joint line, lateral joint line and patellar tendon tenderness noted.       Left knee: He exhibits decreased range of motion, swelling, effusion and abnormal patellar mobility. He exhibits no ecchymosis, no deformity, no laceration, no erythema, normal alignment and no LCL laxity. Tenderness found. Medial joint line and lateral joint line tenderness noted. No patellar tendon tenderness noted.  Neurological: He is alert.  Skin: Skin is warm and dry. No rash noted. No erythema.  Psychiatric: He has a normal mood and affect.    After obtaining informed consent and cleaning the skin using iodine and alcohol a  steroid injection was performed at R  knee using a lateral approach.  Using 1:1 , 1 ml of  1% plain Lidocaine and 40 mg/ml of Depo Medrol. This was well tolerated.   Lab Results  Component Value Date   HGBA1C 8.10 03/10/2015   CBG 145  Repeat A1c 7.4   Assessment & Plan:   [name] was seen today for knee pain.  Diagnoses and all orders for this visit:  Type 2 diabetes mellitus with complication, without  long-term current use of insulin (HCC) -     POCT glycosylated hemoglobin (Hb A1C) -     POCT glucose (manual entry)  Bilateral chronic knee pain -     DG Knee AP/LAT W/Sunrise Left; Future -     DG Knee AP/LAT W/Sunrise Right; Future -     methylPREDNISolone acetate (DEPO-MEDROL) injection 40 mg; Inject 1 mL (40 mg total) into the articular space once. -     methylPREDNISolone acetate (DEPO-MEDROL) injection 40 mg; Inject 1 mL (40 mg total) into the articular space once. -     acetaminophen-codeine (TYLENOL #3) 300-30 MG tablet; Take 1 tablet by mouth every 8 (eight) hours as needed for moderate pain.  Decubitus ulcer of sacral area, stage III (HCC) -     acetaminophen-codeine (TYLENOL #3) 300-30 MG tablet; Take 1 tablet by mouth every 8 (eight) hours as needed for moderate pain.  No orders of the defined types were placed in this encounter.    Follow-up: Return in about 6 weeks (around 09/05/2015) for knee pain .   Boykin Nearing MD

## 2015-08-03 ENCOUNTER — Emergency Department (HOSPITAL_COMMUNITY)
Admission: EM | Admit: 2015-08-03 | Discharge: 2015-08-03 | Disposition: A | Discharge status: Home / Self Care | Payer: Medicare Other | Attending: Emergency Medicine | Admitting: Emergency Medicine

## 2015-08-03 ENCOUNTER — Encounter (HOSPITAL_COMMUNITY): Payer: Self-pay | Admitting: Emergency Medicine

## 2015-08-03 ENCOUNTER — Emergency Department (HOSPITAL_COMMUNITY): Payer: Medicare Other

## 2015-08-03 DIAGNOSIS — M545 Low back pain: Secondary | ICD-10-CM | POA: Diagnosis present

## 2015-08-03 DIAGNOSIS — T148 Other injury of unspecified body region: Secondary | ICD-10-CM | POA: Diagnosis not present

## 2015-08-03 DIAGNOSIS — I110 Hypertensive heart disease with heart failure: Secondary | ICD-10-CM | POA: Insufficient documentation

## 2015-08-03 DIAGNOSIS — Y92511 Restaurant or cafe as the place of occurrence of the external cause: Secondary | ICD-10-CM | POA: Insufficient documentation

## 2015-08-03 DIAGNOSIS — Z7984 Long term (current) use of oral hypoglycemic drugs: Secondary | ICD-10-CM | POA: Diagnosis not present

## 2015-08-03 DIAGNOSIS — J449 Chronic obstructive pulmonary disease, unspecified: Secondary | ICD-10-CM | POA: Diagnosis not present

## 2015-08-03 DIAGNOSIS — Y999 Unspecified external cause status: Secondary | ICD-10-CM | POA: Diagnosis not present

## 2015-08-03 DIAGNOSIS — W19XXXA Unspecified fall, initial encounter: Secondary | ICD-10-CM

## 2015-08-03 DIAGNOSIS — W010XXA Fall on same level from slipping, tripping and stumbling without subsequent striking against object, initial encounter: Secondary | ICD-10-CM | POA: Insufficient documentation

## 2015-08-03 DIAGNOSIS — Z79899 Other long term (current) drug therapy: Secondary | ICD-10-CM | POA: Diagnosis not present

## 2015-08-03 DIAGNOSIS — F17210 Nicotine dependence, cigarettes, uncomplicated: Secondary | ICD-10-CM | POA: Diagnosis not present

## 2015-08-03 DIAGNOSIS — E119 Type 2 diabetes mellitus without complications: Secondary | ICD-10-CM | POA: Diagnosis not present

## 2015-08-03 DIAGNOSIS — I509 Heart failure, unspecified: Secondary | ICD-10-CM | POA: Diagnosis not present

## 2015-08-03 DIAGNOSIS — S300XXA Contusion of lower back and pelvis, initial encounter: Secondary | ICD-10-CM | POA: Insufficient documentation

## 2015-08-03 DIAGNOSIS — M5489 Other dorsalgia: Secondary | ICD-10-CM | POA: Diagnosis not present

## 2015-08-03 DIAGNOSIS — Y939 Activity, unspecified: Secondary | ICD-10-CM | POA: Diagnosis not present

## 2015-08-03 DIAGNOSIS — S3992XA Unspecified injury of lower back, initial encounter: Secondary | ICD-10-CM | POA: Diagnosis not present

## 2015-08-03 MED ORDER — HYDROCODONE-ACETAMINOPHEN 5-325 MG PO TABS: 1.0000 | ORAL_TABLET | Freq: Four times a day (QID) | ORAL | Status: DC | PRN

## 2015-08-03 MED ORDER — OXYCODONE-ACETAMINOPHEN 5-325 MG PO TABS
1.0000 | ORAL_TABLET | Freq: Once | ORAL | Status: AC
  Administered 2015-08-03: 1 via ORAL
  Filled 2015-08-03: qty 1

## 2015-08-03 NOTE — ED Notes (Signed)
Patient presents from [facility] after falling on a wet floor.  Patient c/o low lumbar/sacral pain after the fall.  Patient also endorses some numbness/tingling in his low back.  Patient denies urinary or fecal incontinence.  Patient's has a trach in place since 2012 because of sleep apnea.  Patient had mucus plug in trach and required suction on arrival.  Patient's lung sounds are diminished bilaterally.  Patient is morbidly obese.

## 2015-08-03 NOTE — Discharge Instructions (Signed)
Take Norco as prescribed as needed for severe pain. Do not take Tylenol No. 3 while taking this medication. Please follow-up with primary care doctor.    Contusion A contusion is a deep bruise. Contusions happen when an injury causes bleeding under the skin. Symptoms of bruising include pain, swelling, and discolored skin. The skin may turn blue, purple, or yellow. HOME CARE   Rest the injured area.  If told, put ice on the injured area.  Put ice in a plastic bag.  Place a towel between your skin and the bag.  Leave the ice on for 20 minutes, 2-3 times per day.  If told, put light pressure (compression) on the injured area using an elastic bandage. Make sure the bandage is not too tight. Remove it and put it back on as told by your doctor.  If possible, raise (elevate) the injured area above the level of your heart while you are sitting or lying down.  Take over-the-counter and prescription medicines only as told by your doctor. GET HELP IF:  Your symptoms do not get better after several days of treatment.  Your symptoms get worse.  You have trouble moving the injured area. GET HELP RIGHT AWAY IF:   You have very bad pain.  You have a loss of feeling (numbness) in a hand or foot.  Your hand or foot turns pale or cold.   This information is not intended to replace advice given to you by your health care provider. Make sure you discuss any questions you have with your health care provider.   Document Released: 07/10/2007 Document Revised: 10/12/2014 Document Reviewed: 06/08/2014 Elsevier Interactive Patient Education [employer].

## 2015-08-03 NOTE — ED Notes (Signed)
Bed: WA14 Expected date:  Expected time:  Means of arrival:  Comments: EMS-fall 

## 2015-08-03 NOTE — ED Provider Notes (Signed)
CSN: [id number]     Arrival date & time 08/03/15  1247 History   First MD Initiated Contact with Patient 08/03/15 1342     Chief Complaint  Patient presents with  . Fall     (Consider location/radiation/quality/duration/timing/severity/associated sxs/prior Treatment) HPI [name] is a 50 y.o. male with history of morbid obesity, CHF, COPD, diabetes, tracheostomy due to severe sleep apnea, presents to emergency department complaining of a fall. Patient states he was at her disease, slipped on what he thinks was urine on the bathroom floor, and fell backwards onto his buttock. He is reporting pain in his sacral area. He states he has a decubitus ulcer which he has had since 2012 when he was bedridden and in a coma. He reports this sacral ulcer used to be stage IV, but states now it is stage II and is improving. He usually applies Vaseline to that area. He denies any pain radiating down his legs. No numbness or weakness in his legs. Denies hitting his head or loss of consciousness. Has no other complaints  Past Medical History  Diagnosis Date  . Ventilator dependent ([place])   . Sleep apnea Dx Oct 2011  . Hypertension Dx Oct 2012  . CHF (congestive heart failure) [facility]) Dx Oct 2012  . COPD (chronic obstructive pulmonary disease) ([place]) Dx 2013  . Diabetes mellitus without complication [facility]) Dx Nov 2015  . Chronic ulcer of sacral region [facility]) 2012   Past Surgical History  Procedure Laterality Date  . Wound debridement  12/21/2010    Procedure: DEBRIDEMENT WOUND;  Surgeon: Harl Bowie, MD;  Location: [facility] OR;  Service: General;  Laterality: N/A;  Sacral  . Tracheostomy tube placement     Family History  Problem Relation Age of Onset  . Cancer Brother   . Chronic Renal Failure Sister    Social History  Substance Use Topics  . Smoking status: Current Every Day Smoker    Types: Cigarettes  . Smokeless tobacco: None  . Alcohol Use: No    Review of Systems  Constitutional:  Negative for fever and chills.  Respiratory: Negative for cough, chest tightness and shortness of breath.   Cardiovascular: Negative for chest pain, palpitations and leg swelling.  Gastrointestinal: Negative for nausea, vomiting, abdominal pain, diarrhea and abdominal distention.  Genitourinary: Negative for dysuria, urgency, frequency and hematuria.  Musculoskeletal: Positive for back pain and arthralgias. Negative for neck pain and neck stiffness.  Skin: Negative for rash.  Allergic/Immunologic: Negative for immunocompromised state.  Neurological: Negative for dizziness, weakness, light-headedness, numbness and headaches.  All other systems reviewed and are negative.     Allergies  Tramadol; Ibuprofen; and Robaxin  Home Medications   Prior to Admission medications   Medication Sig Start Date End Date Taking? Authorizing Provider  ACCU-CHEK FASTCLIX LANCETS MISC 1 each by Does not apply route 3 (three) times daily. E11.9 05/02/14  Yes Josalyn Funches, MD  acetaminophen-codeine (TYLENOL #3) 300-30 MG tablet Take 1 tablet by mouth every 8 (eight) hours as needed for moderate pain. 07/25/15  Yes Josalyn Funches, MD  albuterol (PROVENTIL) (2.5 MG/3ML) 0.083% nebulizer solution Take 3 mLs (2.5 mg total) by nebulization every 6 (six) hours as needed for wheezing or shortness of breath. 03/10/15  Yes Josalyn Funches, MD  atenolol (TENORMIN) 25 MG tablet Take 1 tablet (25 mg total) by mouth daily. 08/04/14  Yes Dorothy Spark, MD  atorvastatin (LIPITOR) 20 MG tablet Take 1 tablet (20 mg total) by mouth daily. 05/05/14  Yes Boykin Nearing, MD  Blood Glucose Monitoring Suppl (ACCU-CHEK NANO SMARTVIEW) W/DEVICE KIT 1 Device by Does not apply route as needed. E 11.9 05/02/14  Yes Josalyn Funches, MD  furosemide (LASIX) 40 MG tablet Take 1 tablet (40 mg total) by mouth 2 (two) times daily. Take 1 tab in the morning and 1 tab at 2 pm 01/20/15  Yes Dorothy Spark, MD  glucose blood (ACCU-CHEK  SMARTVIEW) test strip 1 each by Other route 3 (three) times daily. E11.9 05/02/14  Yes Josalyn Funches, MD  metFORMIN (GLUCOPHAGE) 1000 MG tablet Take 1 tablet (1,000 mg total) by mouth 2 (two) times daily with a meal. 05/02/14  Yes Josalyn Funches, MD  potassium chloride SA (K-DUR,KLOR-CON) 20 MEQ tablet Take 1 tablet (20 mEq total) by mouth daily. 08/04/14  Yes Dorothy Spark, MD  alum & mag hydroxide-simeth (MAALOX/MYLANTA) 200-200-20 MG/5ML suspension Take 30 mLs by mouth every 6 (six) hours as needed for indigestion or heartburn (dyspepsia). Patient not taking: Reported on 07/25/2015 12/02/13   Robbie Lis, MD  losartan (COZAAR) 100 MG tablet Take 1 tablet (100 mg total) by mouth daily. Patient not taking: Reported on 08/03/2015 05/02/14   Boykin Nearing, MD  polyethylene glycol (MIRALAX / GLYCOLAX) packet Take 17 g by mouth 2 (two) times daily as needed for mild constipation. Reported on 08/03/2015    Historical Provider, MD  sitaGLIPtin (JANUVIA) 100 MG tablet Take 1 tablet (100 mg total) by mouth daily. Patient not taking: Reported on 08/03/2015 03/10/15   Boykin Nearing, MD   BP 126/83 mmHg  Pulse 91  Temp(Src) 98.8 F (37.1 C) (Oral)  Resp 20  SpO2 100% Physical Exam  Constitutional: He is oriented to person, place, and time. He appears well-developed and well-nourished. No distress.  Morbidly obese  HENT:  Head: Normocephalic and atraumatic.  Eyes: Conjunctivae are normal.  Neck: Neck supple.  Tracheostomy in place  Cardiovascular: Normal rate, regular rhythm and normal heart sounds.   Pulmonary/Chest: Effort normal. No respiratory distress. He has no wheezes. He has no rales.  Musculoskeletal: He exhibits no edema.  Degenerative palpation over midline lumbar spine and sacral area.  Neurological: He is alert and oriented to person, place, and time.  Skin: Skin is warm and dry.  There is a skin ulceration deep in the fold of upper gluteal cleft. No bleeding. No drainage. Tender  to palpation. No erythema.  Nursing note and vitals reviewed.   ED Course  Procedures (including critical care time) Labs Review Labs Reviewed - No data to display  Imaging Review Dg Lumbar Spine Complete  08/03/2015  CLINICAL DATA:  Low back pain running down the coccyx post fall today, slipped and fell backwards onto floor and bath EXAM: LUMBAR SPINE - COMPLETE 4+ VIEW COMPARISON:  CT abdomen and pelvis 12/06/2010 FINDINGS: Five non-rib-bearing lumbar vertebra. Osseous mineralization normal. Vertebral body heights maintained without fracture or subluxation. Few tiny scattered endplate spurs at lower thoracic and upper lumbar spine. No spondylolysis. Chronic deformity of the distal sacrum and coccyx unchanged from prior CT. Mild sclerosis at both SI joints question sacroiliitis. IMPRESSION: No acute bony abnormalities. Chronic distal sacral deformity. Question sacroiliitis bilaterally. Electronically Signed   By: Lavonia Dana M.D.   On: 08/03/2015 16:09   Dg Sacrum/coccyx  08/03/2015  CLINICAL DATA:  Low back pain that runs down to coccyx.  Fall today. EXAM: SACRUM AND COCCYX - 2+ VIEW COMPARISON:  None. FINDINGS: The patient could not tolerate a lateral view which  severely limits evaluation for fracture. No fractures are identified. IMPRESSION: No fractures are identified. However, the study is significantly limited for fracture evaluation due to lack of a lateral view which cannot be performed due to patient condition. Electronically Signed   By: Dorise Bullion III M.D   On: 08/03/2015 16:07   I have personally reviewed and evaluated these images and lab results as part of my medical decision-making.   EKG Interpretation None      MDM   Final diagnoses:  Fall, initial encounter  Sacral contusion, initial encounter    Patient with pain to the sacrum and lower back after falling. He does have a decubitus ulcer including all cleft and sacrum, does not appear to be infected, no trauma or  bleeding since the fall. X-rays are limited due to body habitus, however no acute findings. Patient was able to ambulate up and down the hallway. We'll discharge home with Norco for pain, follow-up as needed.  Filed Vitals:   08/03/15 1259 08/03/15 1300 08/03/15 1456  BP: 126/83  137/98  Pulse: 91  91  Temp: 98.8 F (37.1 C)  98.8 F (37.1 C)  TempSrc: Oral  Oral  Resp: 20  20  SpO2: 96% 100% 94%     Jeannett Senior, PA-C 08/03/15 2026  Quintella Reichert, MD 08/05/15 1208

## 2015-08-03 NOTE — ED Notes (Signed)
Per EMS-states he was in bathroom at [employer]-slipped on some water and fell on buttocks-trach has a large amount of mucus-suctioned in route-patient has bedsores on buttocks-fall caused increased pain

## 2015-08-09 DIAGNOSIS — Z430 Encounter for attention to tracheostomy: Secondary | ICD-10-CM | POA: Diagnosis not present

## 2015-08-09 DIAGNOSIS — G4733 Obstructive sleep apnea (adult) (pediatric): Secondary | ICD-10-CM | POA: Diagnosis not present

## 2015-08-09 DIAGNOSIS — Z6841 Body Mass Index (BMI) 40.0 and over, adult: Secondary | ICD-10-CM | POA: Diagnosis not present

## 2015-08-11 ENCOUNTER — Ambulatory Visit: Payer: Medicare Other | Admitting: Family Medicine

## 2015-09-04 ENCOUNTER — Telehealth: Payer: Self-pay | Admitting: Family Medicine

## 2015-09-04 NOTE — Telephone Encounter (Signed)
Medication Refill: Tylenol #3  Pt called [facility] to refill but pharmacy states that there are no more refills

## 2015-09-05 NOTE — Telephone Encounter (Signed)
Pt. Called requesting a refill on Tylenol # 3. Please f/u °

## 2015-09-05 NOTE — Telephone Encounter (Signed)
He received 80 tablets of Norco from ENT on 08/09/15. His request will have to wait until his PCP returns.

## 2015-09-21 DIAGNOSIS — Z430 Encounter for attention to tracheostomy: Secondary | ICD-10-CM | POA: Diagnosis not present

## 2015-09-21 DIAGNOSIS — Z6841 Body Mass Index (BMI) 40.0 and over, adult: Secondary | ICD-10-CM | POA: Diagnosis not present

## 2015-09-21 DIAGNOSIS — G4733 Obstructive sleep apnea (adult) (pediatric): Secondary | ICD-10-CM | POA: Diagnosis not present

## 2015-10-21 ENCOUNTER — Emergency Department (HOSPITAL_COMMUNITY)
Admission: EM | Admit: 2015-10-21 | Discharge: 2015-10-21 | Disposition: A | Discharge status: Home / Self Care | Payer: Medicare Other | Attending: Emergency Medicine | Admitting: Emergency Medicine

## 2015-10-21 ENCOUNTER — Emergency Department (HOSPITAL_COMMUNITY): Payer: Medicare Other

## 2015-10-21 ENCOUNTER — Encounter (HOSPITAL_COMMUNITY): Payer: Self-pay | Admitting: Nurse Practitioner

## 2015-10-21 DIAGNOSIS — I110 Hypertensive heart disease with heart failure: Secondary | ICD-10-CM | POA: Diagnosis not present

## 2015-10-21 DIAGNOSIS — F17210 Nicotine dependence, cigarettes, uncomplicated: Secondary | ICD-10-CM | POA: Insufficient documentation

## 2015-10-21 DIAGNOSIS — Y929 Unspecified place or not applicable: Secondary | ICD-10-CM | POA: Insufficient documentation

## 2015-10-21 DIAGNOSIS — M25552 Pain in left hip: Secondary | ICD-10-CM | POA: Insufficient documentation

## 2015-10-21 DIAGNOSIS — J449 Chronic obstructive pulmonary disease, unspecified: Secondary | ICD-10-CM | POA: Diagnosis not present

## 2015-10-21 DIAGNOSIS — S79912A Unspecified injury of left hip, initial encounter: Secondary | ICD-10-CM | POA: Diagnosis not present

## 2015-10-21 DIAGNOSIS — W19XXXA Unspecified fall, initial encounter: Secondary | ICD-10-CM

## 2015-10-21 DIAGNOSIS — E119 Type 2 diabetes mellitus without complications: Secondary | ICD-10-CM | POA: Insufficient documentation

## 2015-10-21 DIAGNOSIS — W1839XA Other fall on same level, initial encounter: Secondary | ICD-10-CM | POA: Diagnosis not present

## 2015-10-21 DIAGNOSIS — Y999 Unspecified external cause status: Secondary | ICD-10-CM | POA: Diagnosis not present

## 2015-10-21 DIAGNOSIS — I5033 Acute on chronic diastolic (congestive) heart failure: Secondary | ICD-10-CM | POA: Insufficient documentation

## 2015-10-21 DIAGNOSIS — Z7984 Long term (current) use of oral hypoglycemic drugs: Secondary | ICD-10-CM | POA: Diagnosis not present

## 2015-10-21 DIAGNOSIS — Y939 Activity, unspecified: Secondary | ICD-10-CM | POA: Insufficient documentation

## 2015-10-21 DIAGNOSIS — Z79899 Other long term (current) drug therapy: Secondary | ICD-10-CM | POA: Diagnosis not present

## 2015-10-21 MED ORDER — ACETAMINOPHEN 500 MG PO TABS
1000.0000 mg | ORAL_TABLET | Freq: Once | ORAL | Status: AC
  Administered 2015-10-21: 1000 mg via ORAL
  Filled 2015-10-21: qty 2

## 2015-10-21 NOTE — Discharge Instructions (Signed)
You have been seen today for hip pain following a fall. Your imaging showed no abnormalities. Follow up with PCP as soon as possible should symptoms fail to resolve. Return to ED should symptoms worsen.

## 2015-10-21 NOTE — ED Notes (Signed)
Registration called, pt needs to file under [employer].

## 2015-10-21 NOTE — ED Triage Notes (Signed)
Pt presents with c/o L buttock pain since he fell last Friday. He tried to see PCP this week but was unable to get appt. He is concerned bc he has hx pressure ulcer to L buttock and is worried it may have reopened or irritated this area. He denies other complaints

## 2015-10-21 NOTE — ED Notes (Signed)
Pt clears secretions with cough.

## 2015-10-21 NOTE — ED Notes (Signed)
Pt dressed and ready for discharge.  

## 2015-10-21 NOTE — ED Provider Notes (Signed)
[facility] DEPT Provider Note   CSN: [id number] Arrival date & time: 10/21/15  1146     History   Chief Complaint Chief Complaint  Patient presents with  . Fall    HPI [name] is a 50 y.o. male.  HPI   [name] is a 50 y.o. male, with a history of CHF, COPD, chronic sacral ulcer, DM, HTN, and tracheostomy, presenting to the ED with Left hip pain following a fall on September 7. Patient states he lost his balance and fell onto the left hip. Patient adds that he has unknown pressure ulcer in the central sacral region and he is afraid this may have broken open. Patient was alerted to some discharge by the home health CNA who visited him today. Pain is achy, rated 7 out of 10, located in the left posterior hip, nonradiating. Patient cannot take most pain medications due to allergies. Patient denies fever/chills, neuro deficits, back/neck pain, or any other complaints.      Past Medical History:  Diagnosis Date  . CHF (congestive heart failure) [facility]) Dx Oct 2012  . Chronic ulcer of sacral region ([place]) 2012  . COPD (chronic obstructive pulmonary disease) ([place]) Dx 2013  . Diabetes mellitus without complication [facility]) Dx Nov 2015  . Hypertension Dx Oct 2012  . Sleep apnea Dx Oct 2011  . Ventilator dependent [facility])     Patient Active Problem List   Diagnosis Date Noted  . Bilateral chronic knee pain 07/25/2015  . Tracheostomy dependent ([place]) 03/10/2015  . COPD (chronic obstructive pulmonary disease) ([place])   . Essential hypertension 01/20/2015  . DOE (dyspnea on exertion) 01/20/2015  . Toenail fungus 07/12/2014  . Acute on chronic diastolic CHF (congestive heart failure), NYHA class 1 ([place]) 06/15/2014  . Hyperlipidemia associated with type 2 diabetes mellitus ([place]) 05/05/2014  . Cardiomegaly 05/02/2014  . Nasal congestion 05/02/2014  . Current smoker 05/02/2014  . Severe obesity (BMI >= 40) ([place]) 11/28/2013  . Type II diabetes mellitus ([place]) 11/28/2013  . Left  anterior fascicular block 11/27/2013  . Poor social situation 11/27/2013  . Monoclonal gammopathy 12/14/2010  . Decubitus ulcer of sacral area 12/10/2010  . Obesity hypoventilation syndrome ([place]) 12/09/2010    Past Surgical History:  Procedure Laterality Date  . TRACHEOSTOMY TUBE PLACEMENT    . WOUND DEBRIDEMENT  12/21/2010   Procedure: DEBRIDEMENT WOUND;  Surgeon: Harl Bowie, MD;  Location: [place];  Service: General;  Laterality: N/A;  Sacral       Home Medications    Prior to Admission medications   Medication Sig Start Date End Date Taking? Authorizing Provider  ACCU-CHEK FASTCLIX LANCETS MISC 1 each by Does not apply route 3 (three) times daily. E11.9 05/02/14   Josalyn Funches, MD  acetaminophen-codeine (TYLENOL #3) 300-30 MG tablet Take 1 tablet by mouth every 8 (eight) hours as needed for moderate pain. 07/25/15   Josalyn Funches, MD  albuterol (PROVENTIL) (2.5 MG/3ML) 0.083% nebulizer solution Take 3 mLs (2.5 mg total) by nebulization every 6 (six) hours as needed for wheezing or shortness of breath. 03/10/15   Boykin Nearing, MD  alum & mag hydroxide-simeth (MAALOX/MYLANTA) 200-200-20 MG/5ML suspension Take 30 mLs by mouth every 6 (six) hours as needed for indigestion or heartburn (dyspepsia). Patient not taking: Reported on 07/25/2015 12/02/13   Robbie Lis, MD  atenolol (TENORMIN) 25 MG tablet Take 1 tablet (25 mg total) by mouth daily. 08/04/14   Dorothy Spark, MD  atorvastatin (LIPITOR) 20 MG  tablet Take 1 tablet (20 mg total) by mouth daily. 05/05/14   Josalyn Funches, MD  Blood Glucose Monitoring Suppl (ACCU-CHEK NANO SMARTVIEW) W/DEVICE KIT 1 Device by Does not apply route as needed. E 11.9 05/02/14   Josalyn Funches, MD  furosemide (LASIX) 40 MG tablet Take 1 tablet (40 mg total) by mouth 2 (two) times daily. Take 1 tab in the morning and 1 tab at 2 pm 01/20/15   Dorothy Spark, MD  glucose blood (ACCU-CHEK SMARTVIEW) test strip 1 each by Other route 3 (three)  times daily. E11.9 05/02/14   Boykin Nearing, MD  HYDROcodone-acetaminophen (NORCO) 5-325 MG tablet Take 1-2 tablets by mouth every 6 (six) hours as needed for moderate pain. 08/03/15   Tatyana Kirichenko, PA-C  losartan (COZAAR) 100 MG tablet Take 1 tablet (100 mg total) by mouth daily. Patient not taking: Reported on 08/03/2015 05/02/14   Boykin Nearing, MD  metFORMIN (GLUCOPHAGE) 1000 MG tablet Take 1 tablet (1,000 mg total) by mouth 2 (two) times daily with a meal. 05/02/14   Boykin Nearing, MD  polyethylene glycol (MIRALAX / GLYCOLAX) packet Take 17 g by mouth 2 (two) times daily as needed for mild constipation. Reported on 08/03/2015    Historical Provider, MD  potassium chloride SA (K-DUR,KLOR-CON) 20 MEQ tablet Take 1 tablet (20 mEq total) by mouth daily. 08/04/14   Dorothy Spark, MD  sitaGLIPtin (JANUVIA) 100 MG tablet Take 1 tablet (100 mg total) by mouth daily. Patient not taking: Reported on 08/03/2015 03/10/15   Boykin Nearing, MD    Family History Family History  Problem Relation Age of Onset  . Cancer Brother   . Chronic Renal Failure Sister     Social History Social History  Substance Use Topics  . Smoking status: Current Every Day Smoker    Types: Cigarettes  . Smokeless tobacco: Not on file  . Alcohol use No     Allergies   Tramadol; Ibuprofen; and Robaxin [methocarbamol]   Review of Systems Review of Systems  Gastrointestinal: Negative for nausea and vomiting.  Musculoskeletal: Positive for arthralgias.  Neurological: Negative for weakness and numbness.  All other systems reviewed and are negative.    Physical Exam Updated Vital Signs BP 125/85 (BP Location: Left Arm)   Pulse 96   Temp 98.7 F (37.1 C) (Oral)   Resp 24   Ht _0  (1.702 m)   Wt (!) 184.2 kg   SpO2 98%   BMI 63.59 kg/m   Physical Exam  Constitutional: He appears well-developed and well-nourished. No distress.  HENT:  Head: Normocephalic and atraumatic.  Eyes: Conjunctivae are  normal.  Neck: Neck supple.  Cardiovascular: Normal rate, regular rhythm and intact distal pulses.   Pulmonary/Chest: Effort normal. No respiratory distress.  Abdominal: Soft. There is no tenderness. There is no guarding.  Musculoskeletal: He exhibits tenderness. He exhibits no edema.  Tenderness to the left posterior hip. Patient is weightbearing and range of motion is intact in this extremity. No midline spinal tenderness.  Lymphadenopathy:    He has no cervical adenopathy.  Neurological: He is alert.  Skin: Skin is warm and dry. He is not diaphoretic.  Patient has a chronic appearing, sacral ulcer with no noted discharge. No inflammation, active bleeding, or other abnormalities.  Psychiatric: He has a normal mood and affect. His behavior is normal.  Nursing note and vitals reviewed.    ED Treatments / Results  Labs (all labs ordered are listed, but only abnormal results are displayed)  Labs Reviewed - No data to display  EKG  EKG Interpretation None       Radiology Dg Hip Unilat W Or Wo Pelvis 2-3 Views Left  Result Date: 10/21/2015 CLINICAL DATA:  Left hip pain following fall 2 days ago. Initial encounter. EXAM: DG HIP (WITH OR WITHOUT PELVIS) 2-3V LEFT COMPARISON:  None. FINDINGS: This is a very limited study secondary to patient body habitus. No gross abnormalities are identified. IMPRESSION: Very limited evaluation of the bony structures secondary to body habitus. If there is strong clinical suspicion of acute bony injury, recommend CT. Electronically Signed   By: Margarette Canada M.D.   On: 10/21/2015 13:44    Procedures Procedures (including critical care time)  Medications Ordered in ED Medications  acetaminophen (TYLENOL) tablet 1,000 mg (1,000 mg Oral Given 10/21/15 1339)     Initial Impression / Assessment and Plan / ED Course  I have reviewed the triage vital signs and the nursing notes.  Pertinent labs & imaging results that were available during my care of the  patient were reviewed by me and considered in my medical decision making (see chart for details).  Clinical Course    Patient with left hip pain following a fall over a week ago. No acute abnormalities noted to the patient's ulcer. Range of motion is intact and no neuro deficits are noted. Xray negative for acute abnormalities. Patient is ambulatory to his normal level. Patient to follow-up with his PCP for repeat evaluation and chronic management of his ulcer. Return precautions discussed.   Vitals:   10/21/15 1151 10/21/15 1230 10/21/15 1345  BP: 125/85 144/92 126/86  Pulse: 96 85 89  Resp: _0 Temp: 98.7 F (37.1 C)    TempSrc: Oral    SpO2: 98% 95% 98%  Weight: (!) 184.2 kg    Height: _1  (1.702 m)       Final Clinical Impressions(s) / ED Diagnoses   Final diagnoses:  Fall, initial encounter  Hip pain, left    New Prescriptions New Prescriptions   No medications on file     Lorayne Bender, Hershal Coria 10/21/15 [place], MD 10/22/15 [phone]

## 2015-11-07 DIAGNOSIS — Z6841 Body Mass Index (BMI) 40.0 and over, adult: Secondary | ICD-10-CM | POA: Diagnosis not present

## 2015-11-07 DIAGNOSIS — Z430 Encounter for attention to tracheostomy: Secondary | ICD-10-CM | POA: Diagnosis not present

## 2015-11-07 DIAGNOSIS — G4733 Obstructive sleep apnea (adult) (pediatric): Secondary | ICD-10-CM | POA: Diagnosis not present

## 2015-11-16 ENCOUNTER — Ambulatory Visit: Payer: Medicare Other | Attending: Family Medicine | Admitting: Family Medicine

## 2015-11-16 ENCOUNTER — Encounter: Payer: Self-pay | Admitting: Family Medicine

## 2015-11-16 VITALS — BP 119/80 | HR 93 | Temp 98.7°F | Ht 67.0 in | Wt >= 6400 oz

## 2015-11-16 DIAGNOSIS — Z7984 Long term (current) use of oral hypoglycemic drugs: Secondary | ICD-10-CM | POA: Diagnosis not present

## 2015-11-16 DIAGNOSIS — E785 Hyperlipidemia, unspecified: Secondary | ICD-10-CM | POA: Insufficient documentation

## 2015-11-16 DIAGNOSIS — M25552 Pain in left hip: Secondary | ICD-10-CM | POA: Diagnosis not present

## 2015-11-16 DIAGNOSIS — M25561 Pain in right knee: Secondary | ICD-10-CM | POA: Diagnosis not present

## 2015-11-16 DIAGNOSIS — I10 Essential (primary) hypertension: Secondary | ICD-10-CM | POA: Insufficient documentation

## 2015-11-16 DIAGNOSIS — Z6841 Body Mass Index (BMI) 40.0 and over, adult: Secondary | ICD-10-CM | POA: Insufficient documentation

## 2015-11-16 DIAGNOSIS — B351 Tinea unguium: Secondary | ICD-10-CM

## 2015-11-16 DIAGNOSIS — E118 Type 2 diabetes mellitus with unspecified complications: Secondary | ICD-10-CM

## 2015-11-16 DIAGNOSIS — G8929 Other chronic pain: Secondary | ICD-10-CM | POA: Diagnosis not present

## 2015-11-16 DIAGNOSIS — M25562 Pain in left knee: Secondary | ICD-10-CM | POA: Diagnosis not present

## 2015-11-16 DIAGNOSIS — Z79899 Other long term (current) drug therapy: Secondary | ICD-10-CM | POA: Insufficient documentation

## 2015-11-16 DIAGNOSIS — F17210 Nicotine dependence, cigarettes, uncomplicated: Secondary | ICD-10-CM | POA: Diagnosis not present

## 2015-11-16 DIAGNOSIS — E662 Morbid (severe) obesity with alveolar hypoventilation: Secondary | ICD-10-CM | POA: Diagnosis not present

## 2015-11-16 DIAGNOSIS — J449 Chronic obstructive pulmonary disease, unspecified: Secondary | ICD-10-CM | POA: Insufficient documentation

## 2015-11-16 LAB — POCT GLYCOSYLATED HEMOGLOBIN (HGB A1C): HEMOGLOBIN A1C: 7.4

## 2015-11-16 LAB — GLUCOSE, POCT (MANUAL RESULT ENTRY): POC Glucose: 149 mg/dl — AB (ref 70–99)

## 2015-11-16 MED ORDER — ACETAMINOPHEN-CODEINE #4 300-60 MG PO TABS: 1.0000 | ORAL_TABLET | Freq: Four times a day (QID) | ORAL | 2 refills | Status: DC | PRN

## 2015-11-16 MED ORDER — ACETAMINOPHEN-CODEINE #4 300-60 MG PO TABS: 1.0000 | ORAL_TABLET | ORAL | 2 refills | Status: DC | PRN

## 2015-11-16 NOTE — Progress Notes (Signed)
Pt want information about a bath chair, detachable shower head.

## 2015-11-16 NOTE — Progress Notes (Signed)
Subjective:  Patient ID: [name], male    DOB: March 16, 1965  Age: 50 y.o. MRN: [id number]  CC: Follow-up (fall)   HPI Undra RAYFIELD BEEM has morbid obesity, diastolic CHF, COPD, HTN, HLD, DM2, obesity hypoventilation syndrome, trach dependent. Hx of large sacral decubitus ulcer, chronic bilateral knee pain,   he presents for   1. ED f/u fall: he had a fall while out shopping on 10/12/2015 onto his left hip. He presented to ED on 10/21/15 for pain and concern about reopening his decubitus ulcer. His pain is achy L sided and posterior hip 7/10. He had a few vicodin from the ED that he took. He takes tylenol # 3 for chronic pain which he reports dose not control his pain. He reports feeling unstable when standing at times. He request an order for bath chair and detachable shower head.   Social History  Substance Use Topics  . Smoking status: Current Every Day Smoker    Types: Cigarettes  . Smokeless tobacco: Not on file  . Alcohol use No    Outpatient Medications Prior to Visit  Medication Sig Dispense Refill  . ACCU-CHEK FASTCLIX LANCETS MISC 1 each by Does not apply route 3 (three) times daily. E11.9 102 each 11  . acetaminophen-codeine (TYLENOL #3) 300-30 MG tablet Take 1 tablet by mouth every 8 (eight) hours as needed for moderate pain. 90 tablet 2  . albuterol (PROVENTIL) (2.5 MG/3ML) 0.083% nebulizer solution Take 3 mLs (2.5 mg total) by nebulization every 6 (six) hours as needed for wheezing or shortness of breath. 75 mL 5  . atenolol (TENORMIN) 25 MG tablet Take 1 tablet (25 mg total) by mouth daily. 90 tablet 3  . atorvastatin (LIPITOR) 20 MG tablet Take 1 tablet (20 mg total) by mouth daily. 30 tablet 11  . Blood Glucose Monitoring Suppl (ACCU-CHEK NANO SMARTVIEW) W/DEVICE KIT 1 Device by Does not apply route as needed. E 11.9 1 kit 0  . furosemide (LASIX) 40 MG tablet Take 1 tablet (40 mg total) by mouth 2 (two) times daily. Take 1 tab in the morning and 1 tab at 2 pm 180 tablet 11    . glucose blood (ACCU-CHEK SMARTVIEW) test strip 1 each by Other route 3 (three) times daily. E11.9 100 each 11  . metFORMIN (GLUCOPHAGE) 1000 MG tablet Take 1 tablet (1,000 mg total) by mouth 2 (two) times daily with a meal. 180 tablet 3  . potassium chloride SA (K-DUR,KLOR-CON) 20 MEQ tablet Take 1 tablet (20 mEq total) by mouth daily. 90 tablet 3  . alum & mag hydroxide-simeth (MAALOX/MYLANTA) 200-200-20 MG/5ML suspension Take 30 mLs by mouth every 6 (six) hours as needed for indigestion or heartburn (dyspepsia). (Patient not taking: Reported on 11/16/2015) 355 mL 0  . HYDROcodone-acetaminophen (NORCO) 5-325 MG tablet Take 1-2 tablets by mouth every 6 (six) hours as needed for moderate pain. (Patient not taking: Reported on 11/16/2015) 15 tablet 0  . losartan (COZAAR) 100 MG tablet Take 1 tablet (100 mg total) by mouth daily. (Patient not taking: Reported on 11/16/2015) 90 tablet 3  . polyethylene glycol (MIRALAX / GLYCOLAX) packet Take 17 g by mouth 2 (two) times daily as needed for mild constipation. Reported on 08/03/2015    . sitaGLIPtin (JANUVIA) 100 MG tablet Take 1 tablet (100 mg total) by mouth daily. (Patient not taking: Reported on 11/16/2015) 90 tablet 3   No facility-administered medications prior to visit.     ROS Review of Systems  Constitutional: Negative  for chills, fatigue, fever and unexpected weight change.  Eyes: Negative for visual disturbance.  Respiratory: Negative for cough and shortness of breath.   Cardiovascular: Negative for chest pain, palpitations and leg swelling.  Gastrointestinal: Negative for abdominal pain, blood in stool, constipation, diarrhea, nausea and vomiting.  Endocrine: Negative for polydipsia, polyphagia and polyuria.  Musculoskeletal: Positive for arthralgias. Negative for back pain, gait problem, myalgias and neck pain.  Skin: Negative for rash.  Allergic/Immunologic: Negative for immunocompromised state.  Hematological: Negative for  adenopathy. Does not bruise/bleed easily.  Psychiatric/Behavioral: Negative for dysphoric mood, sleep disturbance and suicidal ideas. The patient is not nervous/anxious.     Objective:  BP 119/80 (BP Location: Right Wrist, Patient Position: Sitting, Cuff Size: Large)   Pulse 93   Temp 98.7 F (37.1 C) (Oral)   Ht 5' 7"  (1.702 m)   Wt (!) 404 lb (183.3 kg)   SpO2 95%   BMI 63.28 kg/m   BP/Weight 11/16/2015 10/21/2015 5/40/9811  Systolic BP 914 782 956  Diastolic BP 80 89 98  Wt. (Lbs) 404 406 -  BMI 63.28 63.59 -   Wt Readings from Last 3 Encounters:  11/16/15 (!) 404 lb (183.3 kg)  10/21/15 (!) 406 lb (184.2 kg)  07/25/15 (!) 405 lb (183.7 kg)    Physical Exam  Constitutional: He appears well-developed and well-nourished. No distress.  HENT:  Head: Normocephalic and atraumatic.  Neck: Normal range of motion. Neck supple.    Cardiovascular: Normal rate, regular rhythm, normal heart sounds and intact distal pulses.   Pulmonary/Chest: Effort normal and breath sounds normal.  Musculoskeletal: He exhibits no edema.       Back:  Neurological: He is alert.  Skin: Skin is warm and dry. No rash noted. No erythema.  Psychiatric: His speech is normal and behavior is normal. Thought content normal. His affect is angry.   Lab Results  Component Value Date   HGBA1C 7.4 11/16/2015   CBG 149   Assessment & Plan:   Talib was seen today for follow-up.  Diagnoses and all orders for this visit:  Type 2 diabetes mellitus with complication, without long-term current use of insulin (HCC) -     POCT glucose (manual entry) -     POCT glycosylated hemoglobin (Hb A1C) -     COMPLETE METABOLIC PANEL WITH GFR; Future -     Lipid Panel; Future  Toenail fungus -     Ambulatory referral to Podiatry  Bilateral chronic knee pain -     Discontinue: acetaminophen-codeine (TYLENOL #4) 300-60 MG tablet; Take 1 tablet by mouth every 4 (four) hours as needed for moderate pain. -      Ambulatory referral to Orthopedic Surgery -     acetaminophen-codeine (TYLENOL #4) 300-60 MG tablet; Take 1 tablet by mouth every 6 (six) hours as needed for moderate pain.  Left hip pain -     Discontinue: acetaminophen-codeine (TYLENOL #4) 300-60 MG tablet; Take 1 tablet by mouth every 4 (four) hours as needed for moderate pain. -     CT HIP LEFT WO CONTRAST; Future -     Ambulatory referral to Orthopedic Surgery -     acetaminophen-codeine (TYLENOL #4) 300-60 MG tablet; Take 1 tablet by mouth every 6 (six) hours as needed for moderate pain.  Onychomycosis -     Ambulatory referral to Podiatry    No orders of the defined types were placed in this encounter.   Follow-up: Return in about 6 weeks (around 12/28/2015)  for L hip pain .   Boykin Nearing MD

## 2015-11-16 NOTE — Patient Instructions (Addendum)
Clerance was seen today for follow-up.  Diagnoses and all orders for this visit:  Type 2 diabetes mellitus with complication, without long-term current use of insulin (HCC) -     POCT glucose (manual entry) -     POCT glycosylated hemoglobin (Hb A1C) -     COMPLETE METABOLIC PANEL WITH GFR; Future -     Lipid Panel; Future  Toenail fungus -     Ambulatory referral to Podiatry  Bilateral chronic knee pain -     Discontinue: acetaminophen-codeine (TYLENOL #4) 300-60 MG tablet; Take 1 tablet by mouth every 4 (four) hours as needed for moderate pain. -     Ambulatory referral to Orthopedic Surgery -     acetaminophen-codeine (TYLENOL #4) 300-60 MG tablet; Take 1 tablet by mouth every 6 (six) hours as needed for moderate pain.  Left hip pain -     Discontinue: acetaminophen-codeine (TYLENOL #4) 300-60 MG tablet; Take 1 tablet by mouth every 4 (four) hours as needed for moderate pain. -     CT HIP LEFT WO CONTRAST; Future -     Ambulatory referral to Orthopedic Surgery -     acetaminophen-codeine (TYLENOL #4) 300-60 MG tablet; Take 1 tablet by mouth every 6 (six) hours as needed for moderate pain.  you will be called with referrals  F/u in 6 weeks for left hip pain   Dr. Adrian Blackwater

## 2015-11-21 DIAGNOSIS — M25552 Pain in left hip: Secondary | ICD-10-CM | POA: Insufficient documentation

## 2015-11-21 NOTE — Assessment & Plan Note (Signed)
Obese patient with b/l knee pain and L hip pain, recent fall. Pain is not improving. Exam is difficult due to habitus.  Plan: CT L hip w.o contrast Referral to ortho Tylenol #4 to replace tylenol #3 for pain control Order for shower chair and detachable shower head will be faxed to home health agency

## 2015-11-23 ENCOUNTER — Ambulatory Visit (HOSPITAL_COMMUNITY): Admission: RE | Admit: 2015-11-23 | Payer: Medicare Other | Source: Ambulatory Visit

## 2015-11-30 ENCOUNTER — Encounter: Payer: Self-pay | Admitting: Podiatry

## 2015-11-30 ENCOUNTER — Ambulatory Visit (INDEPENDENT_AMBULATORY_CARE_PROVIDER_SITE_OTHER): Payer: Medicare Other | Admitting: Podiatry

## 2015-11-30 VITALS — Wt >= 6400 oz

## 2015-11-30 DIAGNOSIS — L608 Other nail disorders: Secondary | ICD-10-CM

## 2015-11-30 DIAGNOSIS — L603 Nail dystrophy: Secondary | ICD-10-CM | POA: Diagnosis not present

## 2015-11-30 DIAGNOSIS — B351 Tinea unguium: Secondary | ICD-10-CM

## 2015-11-30 NOTE — Progress Notes (Signed)
   Subjective:    Patient ID: [name], male    DOB: 11-29-1965, 50 y.o.   MRN: [id number]  HPI    Review of Systems  Constitutional: Positive for activity change.  HENT: Positive for sore throat.   Respiratory: Positive for shortness of breath.        Objective:   Physical Exam        Assessment & Plan:

## 2015-12-05 ENCOUNTER — Ambulatory Visit (INDEPENDENT_AMBULATORY_CARE_PROVIDER_SITE_OTHER): Payer: Self-pay | Admitting: Orthopaedic Surgery

## 2015-12-05 ENCOUNTER — Encounter (INDEPENDENT_AMBULATORY_CARE_PROVIDER_SITE_OTHER): Payer: Self-pay | Admitting: Orthopaedic Surgery

## 2015-12-05 ENCOUNTER — Ambulatory Visit (INDEPENDENT_AMBULATORY_CARE_PROVIDER_SITE_OTHER): Payer: Medicare Other | Admitting: Orthopaedic Surgery

## 2015-12-05 DIAGNOSIS — M25552 Pain in left hip: Secondary | ICD-10-CM

## 2015-12-05 DIAGNOSIS — L89150 Pressure ulcer of sacral region, unstageable: Secondary | ICD-10-CM | POA: Diagnosis not present

## 2015-12-05 NOTE — Progress Notes (Signed)
Office Visit Note   Patient: [name]           Date of Birth: 1965/09/01           MRN: [id number] Visit Date: 12/05/2015              Requested by: Boykin Nearing, MD [address] [place], [place] [address] PCP: Minerva Ends, MD   Assessment & Plan: Visit Diagnoses:  1. Pain of left hip joint   2. Severe obesity (BMI >= 40) (HCC)   3. Decubitus ulcer of sacral region, unstageable [facility])     Plan:  - xrays reviewed which shows no significant DJD - urged patient to lose weight - patient is a poor surgical candidate - pain management per PCP - f/u prn  Follow-Up Instructions: No Follow-up on file.   Orders:  No orders of the defined types were placed in this encounter.  No orders of the defined types were placed in this encounter.     Procedures: No procedures performed   Clinical Data: No additional findings.   Subjective: No chief complaint on file.   50 yo morbidly obese very pleasant gentleman comes in with left hip pain.  Has decub ulcer over left buttock where his pain is located.  Denies groin pain or radic pain.  Endorses throbbing pain.  Takes tylenol #3.  Recently had bloody stools but has ceased.  [place] a couple of weeks ago at the store.  Denies any f/c/n/v.    Review of Systems  Constitutional: Negative.   HENT: Negative.   Eyes: Negative.   Respiratory: Negative.   Cardiovascular: Negative.   Gastrointestinal: Negative.   Endocrine: Negative.   Genitourinary: Negative.   Musculoskeletal: Negative.   Skin: Negative.   Allergic/Immunologic: Negative.   Neurological: Negative.   Hematological: Negative.   Psychiatric/Behavioral: Negative.      Objective: Vital Signs: There were no vitals taken for this visit.  Physical Exam  Constitutional: He is oriented to person, place, and time. He appears well-developed and well-nourished.  obese  HENT:  Head: Normocephalic and atraumatic.  Eyes: EOM are normal.  Neck:  Has  trach  Cardiovascular: Intact distal pulses.   Pulmonary/Chest:  Has trach  Abdominal: Soft.  Musculoskeletal:       Left knee: He exhibits no effusion.  Neurological: He is alert and oriented to person, place, and time.  Skin: Skin is warm.  Psychiatric: He has a normal mood and affect. His behavior is normal. Judgment and thought content normal.  Nursing note and vitals reviewed.   Left Knee Exam   Other  Effusion: no effusion present   Left Hip Exam   Comments:  Hip exam is limited due to body habitus.  No obvious pain with hip ROM.  Slight ttp over decub ulcer.        Specialty Comments:  No specialty comments available.  Imaging: No results found.   PMFS History: Patient Active Problem List   Diagnosis Date Noted  . Pain of left hip joint 11/21/2015  . Bilateral chronic knee pain 07/25/2015  . Tracheostomy dependent ([place]) 03/10/2015  . COPD (chronic obstructive pulmonary disease) ([place])   . Essential hypertension 01/20/2015  . DOE (dyspnea on exertion) 01/20/2015  . Toenail fungus 07/12/2014  . Acute on chronic diastolic CHF (congestive heart failure), NYHA class 1 ([place]) 06/15/2014  . Hyperlipidemia associated with type 2 diabetes mellitus ([place]) 05/05/2014  . Cardiomegaly 05/02/2014  . Nasal congestion 05/02/2014  .  Current smoker 05/02/2014  . Severe obesity (BMI >= 40) ([place]) 11/28/2013  . Type II diabetes mellitus ([place]) 11/28/2013  . Left anterior fascicular block 11/27/2013  . Poor social situation 11/27/2013  . Monoclonal gammopathy 12/14/2010  . Decubitus ulcer of sacral area 12/10/2010  . Obesity hypoventilation syndrome ([place]) 12/09/2010   Past Medical History:  Diagnosis Date  . CHF (congestive heart failure) [facility]) Dx Oct 2012  . Chronic ulcer of sacral region ([place]) 2012  . COPD (chronic obstructive pulmonary disease) ([place]) Dx 2013  . Diabetes mellitus without complication [facility]) Dx Nov 2015  . Hypertension Dx Oct 2012  . Sleep apnea Dx Oct  2011  . Ventilator dependent (HCC)     Family History  Problem Relation Age of Onset  . Cancer Brother   . Chronic Renal Failure Sister     Past Surgical History:  Procedure Laterality Date  . TRACHEOSTOMY TUBE PLACEMENT    . WOUND DEBRIDEMENT  12/21/2010   Procedure: DEBRIDEMENT WOUND;  Surgeon: Harl Bowie, MD;  Location: [facility];  Service: General;  Laterality: N/A;  Sacral   Social History   Occupational History  . Disabled Unemployed   Social History Main Topics  . Smoking status: Current Every Day Smoker    Types: Cigarettes  . Smokeless tobacco: Not on file  . Alcohol use No  . Drug use:     Types: Marijuana  . Sexual activity: Yes    Partners: Male

## 2015-12-10 NOTE — Progress Notes (Signed)
Patient ID: RAFAN MIAL, male   DOB: 1965/06/16, 50 y.o.   MRN: [id number] SUBJECTIVE Patient  presents to office today complaining of elongated, thickened nails. Pain while ambulating in shoes. Patient is unable to trim their own nails.   OBJECTIVE General Patient is awake, alert, and oriented x 3 and in no acute distress. Derm Skin is dry and supple bilateral. Negative open lesions or macerations. Remaining integument unremarkable. Nails are tender, long, thickened and dystrophic with subungual debris, consistent with onychomycosis, 1-5 bilateral. No signs of infection noted. Vasc  DP and PT pedal pulses palpable bilaterally. Temperature gradient within normal limits.  Neuro Epicritic and protective threshold sensation diminished bilaterally.  Musculoskeletal Exam No symptomatic pedal deformities noted bilateral. Muscular strength within normal limits.  ASSESSMENT 1. Onychodystrophic nails 1-5 bilateral with hyperkeratosis of nails.  2. Onychomycosis of nail due to dermatophyte bilateral 3. Pain in foot bilateral  PLAN OF CARE 1. Patient evaluated today.  2. Instructed to maintain good pedal hygiene and foot care.  3. Mechanical debridement of nails 1-5 bilaterally performed using a nail nipper. Filed with dremel without incident.  4. Return to clinic in 3 mos.    Edrick Kins, DPM

## 2016-01-10 DIAGNOSIS — R042 Hemoptysis: Secondary | ICD-10-CM | POA: Diagnosis not present

## 2016-01-10 DIAGNOSIS — G4733 Obstructive sleep apnea (adult) (pediatric): Secondary | ICD-10-CM | POA: Diagnosis not present

## 2016-01-10 DIAGNOSIS — Z430 Encounter for attention to tracheostomy: Secondary | ICD-10-CM | POA: Diagnosis not present

## 2016-01-10 DIAGNOSIS — Z6841 Body Mass Index (BMI) 40.0 and over, adult: Secondary | ICD-10-CM | POA: Diagnosis not present

## 2016-02-20 ENCOUNTER — Telehealth: Payer: Self-pay | Admitting: Family Medicine

## 2016-02-20 NOTE — Telephone Encounter (Signed)
Patient would like to know the status for a shower chair that was ordered in December from [facility]. Patient said it was supposed to be mailed to home Please follow up.

## 2016-03-04 ENCOUNTER — Ambulatory Visit (INDEPENDENT_AMBULATORY_CARE_PROVIDER_SITE_OTHER): Payer: Medicare Other | Admitting: Podiatry

## 2016-03-04 ENCOUNTER — Encounter: Payer: Self-pay | Admitting: Podiatry

## 2016-03-04 DIAGNOSIS — B351 Tinea unguium: Secondary | ICD-10-CM | POA: Diagnosis not present

## 2016-03-04 DIAGNOSIS — M79676 Pain in unspecified toe(s): Secondary | ICD-10-CM

## 2016-03-04 DIAGNOSIS — M79609 Pain in unspecified limb: Secondary | ICD-10-CM | POA: Diagnosis not present

## 2016-03-04 DIAGNOSIS — L608 Other nail disorders: Secondary | ICD-10-CM

## 2016-03-04 DIAGNOSIS — L603 Nail dystrophy: Secondary | ICD-10-CM

## 2016-03-14 DIAGNOSIS — F17210 Nicotine dependence, cigarettes, uncomplicated: Secondary | ICD-10-CM | POA: Diagnosis not present

## 2016-03-14 DIAGNOSIS — E662 Morbid (severe) obesity with alveolar hypoventilation: Secondary | ICD-10-CM | POA: Diagnosis not present

## 2016-03-14 DIAGNOSIS — Z6841 Body Mass Index (BMI) 40.0 and over, adult: Secondary | ICD-10-CM | POA: Diagnosis not present

## 2016-03-14 DIAGNOSIS — Z930 Tracheostomy status: Secondary | ICD-10-CM | POA: Diagnosis not present

## 2016-03-14 DIAGNOSIS — H6123 Impacted cerumen, bilateral: Secondary | ICD-10-CM | POA: Diagnosis not present

## 2016-03-14 DIAGNOSIS — Z430 Encounter for attention to tracheostomy: Secondary | ICD-10-CM | POA: Diagnosis not present

## 2016-03-14 DIAGNOSIS — G4733 Obstructive sleep apnea (adult) (pediatric): Secondary | ICD-10-CM | POA: Diagnosis not present

## 2016-03-15 NOTE — Progress Notes (Signed)
   SUBJECTIVE Patient  presents to office today complaining of elongated, thickened nails. Pain while ambulating in shoes. Patient is unable to trim their own nails.  Patient also complains that his feet give out on him.  OBJECTIVE General Patient is awake, alert, and oriented x 3 and in no acute distress. Derm Skin is dry and supple bilateral. Negative open lesions or macerations. Remaining integument unremarkable. Nails are tender, long, thickened and dystrophic with subungual debris, consistent with onychomycosis, 1-5 bilateral. No signs of infection noted. Vasc  DP and PT pedal pulses palpable bilaterally. Temperature gradient within normal limits.  Neuro Epicritic and protective threshold sensation diminished bilaterally.  Musculoskeletal Exam No symptomatic pedal deformities noted bilateral. Muscular strength within normal limits.  ASSESSMENT 1. Onychodystrophic nails 1-5 bilateral with hyperkeratosis of nails.  2. Onychomycosis of nail due to dermatophyte bilateral 3. Pain in foot bilateral 4. Generalized right foot pain secondary to shoe gear  PLAN OF CARE 1. Patient evaluated today.  2. Instructed to maintain good pedal hygiene and foot care.  3. Mechanical debridement of nails 1-5 bilaterally performed using a nail nipper. Filed with dremel without incident.  4. Recommend new shoes. 5. Return to clinic in 3 mos.    Edrick Kins, DPM [facility]  Dr. Edrick Kins, [address]                                        [place], [place] [address]                Office [phone]  Fax [phone]

## 2016-04-09 ENCOUNTER — Telehealth: Payer: Self-pay | Admitting: Family Medicine

## 2016-04-09 NOTE — Telephone Encounter (Signed)
Will route to PCP 

## 2016-04-09 NOTE — Telephone Encounter (Signed)
Patient called the office to speak with PCP in regards to having documents (copy of TB and a FL2) sent over to [facility] facility. Please fax it to [phone]. Attention: Damien Fusi. Pt needs these docs sent as soon as possible. They only have one spot left (for males).   Thank you.

## 2016-04-10 NOTE — Telephone Encounter (Signed)
Patient is following up regarding call from yesterday. Patient is in urgent need of paperwork to be sent over today if not he will be homeless...  Please follow up with patient

## 2016-04-10 NOTE — Telephone Encounter (Signed)
Noted waiting to get more information from pt to complete form.

## 2016-04-11 ENCOUNTER — Other Ambulatory Visit: Payer: Medicare Other

## 2016-04-12 NOTE — Telephone Encounter (Signed)
Reviewed Patient will need to come to clinic for PPD

## 2016-04-15 ENCOUNTER — Other Ambulatory Visit: Payer: Medicare Other

## 2016-04-24 DIAGNOSIS — E118 Type 2 diabetes mellitus with unspecified complications: Secondary | ICD-10-CM | POA: Diagnosis not present

## 2016-04-24 DIAGNOSIS — R609 Edema, unspecified: Secondary | ICD-10-CM | POA: Diagnosis not present

## 2016-04-24 DIAGNOSIS — I509 Heart failure, unspecified: Secondary | ICD-10-CM | POA: Diagnosis not present

## 2016-04-24 DIAGNOSIS — Z9114 Patient's other noncompliance with medication regimen: Secondary | ICD-10-CM | POA: Diagnosis not present

## 2016-04-24 DIAGNOSIS — Z930 Tracheostomy status: Secondary | ICD-10-CM | POA: Diagnosis not present

## 2016-04-24 DIAGNOSIS — Z9111 Patient's noncompliance with dietary regimen: Secondary | ICD-10-CM | POA: Diagnosis not present

## 2016-05-05 ENCOUNTER — Emergency Department (HOSPITAL_COMMUNITY)
Admission: EM | Admit: 2016-05-05 | Discharge: 2016-05-05 | Disposition: A | Discharge status: Home / Self Care | Payer: Medicare Other | Attending: Emergency Medicine | Admitting: Emergency Medicine

## 2016-05-05 ENCOUNTER — Encounter (HOSPITAL_COMMUNITY): Payer: Self-pay

## 2016-05-05 DIAGNOSIS — I110 Hypertensive heart disease with heart failure: Secondary | ICD-10-CM | POA: Insufficient documentation

## 2016-05-05 DIAGNOSIS — Z79899 Other long term (current) drug therapy: Secondary | ICD-10-CM | POA: Insufficient documentation

## 2016-05-05 DIAGNOSIS — J449 Chronic obstructive pulmonary disease, unspecified: Secondary | ICD-10-CM | POA: Insufficient documentation

## 2016-05-05 DIAGNOSIS — R030 Elevated blood-pressure reading, without diagnosis of hypertension: Secondary | ICD-10-CM | POA: Diagnosis not present

## 2016-05-05 DIAGNOSIS — R51 Headache: Secondary | ICD-10-CM | POA: Diagnosis not present

## 2016-05-05 DIAGNOSIS — Z7984 Long term (current) use of oral hypoglycemic drugs: Secondary | ICD-10-CM | POA: Diagnosis not present

## 2016-05-05 DIAGNOSIS — I10 Essential (primary) hypertension: Secondary | ICD-10-CM | POA: Diagnosis not present

## 2016-05-05 DIAGNOSIS — E669 Obesity, unspecified: Secondary | ICD-10-CM | POA: Diagnosis not present

## 2016-05-05 DIAGNOSIS — E119 Type 2 diabetes mellitus without complications: Secondary | ICD-10-CM | POA: Insufficient documentation

## 2016-05-05 DIAGNOSIS — F17210 Nicotine dependence, cigarettes, uncomplicated: Secondary | ICD-10-CM | POA: Diagnosis not present

## 2016-05-05 DIAGNOSIS — I5033 Acute on chronic diastolic (congestive) heart failure: Secondary | ICD-10-CM | POA: Insufficient documentation

## 2016-05-05 DIAGNOSIS — G4489 Other headache syndrome: Secondary | ICD-10-CM | POA: Diagnosis not present

## 2016-05-05 NOTE — ED Provider Notes (Signed)
[facility] DEPT Provider Note   CSN: [id number] Arrival date & time: 05/05/16  1544     History   Chief Complaint Chief Complaint  Patient presents with  . Hypertension    HPI Scot BREYLAN LEFEVERS is a 51 y.o. male.  Patient sent in from [facility] manner staff reports patient has a headache and has elevated blood pressure. Patient has a history of hypertension. EMS noted blood pressures to be systolic 782. Patient denies any complaints upon arrival here. And blood pressure here was in the 956 systolic. Patient denies headache short chest pain shortness of breath abdominal pain or any strokelike symptoms. Patient had a long-standing tracheostomy. He is requesting to have it suctioned.      Past Medical History:  Diagnosis Date  . CHF (congestive heart failure) [facility]) Dx Oct 2012  . Chronic ulcer of sacral region ([place]) 2012  . COPD (chronic obstructive pulmonary disease) ([place]) Dx 2013  . Diabetes mellitus without complication [facility]) Dx Nov 2015  . Hypertension Dx Oct 2012  . Sleep apnea Dx Oct 2011  . Ventilator dependent [facility])     Patient Active Problem List   Diagnosis Date Noted  . Pain of left hip joint 11/21/2015  . Bilateral chronic knee pain 07/25/2015  . Tracheostomy dependent ([place]) 03/10/2015  . COPD (chronic obstructive pulmonary disease) ([place])   . Essential hypertension 01/20/2015  . DOE (dyspnea on exertion) 01/20/2015  . Toenail fungus 07/12/2014  . Acute on chronic diastolic CHF (congestive heart failure), NYHA class 1 ([place]) 06/15/2014  . Hyperlipidemia associated with type 2 diabetes mellitus ([place]) 05/05/2014  . Cardiomegaly 05/02/2014  . Nasal congestion 05/02/2014  . Current smoker 05/02/2014  . Severe obesity (BMI >= 40) ([place]) 11/28/2013  . Type II diabetes mellitus ([place]) 11/28/2013  . Left anterior fascicular block 11/27/2013  . Poor social situation 11/27/2013  . Monoclonal gammopathy 12/14/2010  . Decubitus ulcer of sacral area 12/10/2010  .  Obesity hypoventilation syndrome ([place]) 12/09/2010    Past Surgical History:  Procedure Laterality Date  . TRACHEOSTOMY TUBE PLACEMENT    . WOUND DEBRIDEMENT  12/21/2010   Procedure: DEBRIDEMENT WOUND;  Surgeon: Harl Bowie, MD;  Location: [place];  Service: General;  Laterality: N/A;  Sacral       Home Medications    Prior to Admission medications   Medication Sig Start Date End Date Taking? Authorizing Provider  ACCU-CHEK FASTCLIX LANCETS MISC 1 each by Does not apply route 3 (three) times daily. E11.9 05/02/14   Josalyn Funches, MD  acetaminophen-codeine (TYLENOL #4) 300-60 MG tablet Take 1 tablet by mouth every 6 (six) hours as needed for moderate pain. 11/16/15   Josalyn Funches, MD  albuterol (PROVENTIL) (2.5 MG/3ML) 0.083% nebulizer solution Take 3 mLs (2.5 mg total) by nebulization every 6 (six) hours as needed for wheezing or shortness of breath. 03/10/15   Josalyn Funches, MD  atenolol (TENORMIN) 25 MG tablet Take 1 tablet (25 mg total) by mouth daily. 08/04/14   Dorothy Spark, MD  atorvastatin (LIPITOR) 20 MG tablet Take 1 tablet (20 mg total) by mouth daily. 05/05/14   Josalyn Funches, MD  Blood Glucose Monitoring Suppl (ACCU-CHEK NANO SMARTVIEW) W/DEVICE KIT 1 Device by Does not apply route as needed. E 11.9 05/02/14   Josalyn Funches, MD  furosemide (LASIX) 40 MG tablet Take 1 tablet (40 mg total) by mouth 2 (two) times daily. Take 1 tab in the morning and 1 tab at 2 pm 01/20/15   Houston Siren  Hazel Sams, MD  glucose blood (ACCU-CHEK SMARTVIEW) test strip 1 each by Other route 3 (three) times daily. E11.9 05/02/14   Boykin Nearing, MD  metFORMIN (GLUCOPHAGE) 1000 MG tablet Take 1 tablet (1,000 mg total) by mouth 2 (two) times daily with a meal. Patient not taking: Reported on 11/30/2015 05/02/14   Boykin Nearing, MD  polyethylene glycol (MIRALAX / GLYCOLAX) packet Take 17 g by mouth 2 (two) times daily as needed for mild constipation. Reported on 08/03/2015    Historical Provider,  MD  potassium chloride SA (K-DUR,KLOR-CON) 20 MEQ tablet Take 1 tablet (20 mEq total) by mouth daily. 08/04/14   Dorothy Spark, MD    Family History Family History  Problem Relation Age of Onset  . Cancer Brother   . Chronic Renal Failure Sister     Social History Social History  Substance Use Topics  . Smoking status: Current Every Day Smoker    Types: Cigarettes  . Smokeless tobacco: Never Used  . Alcohol use No     Allergies   Tramadol; Ibuprofen; and Robaxin [methocarbamol]   Review of Systems Review of Systems  Constitutional: Negative for fever.  HENT: Negative for congestion.   Eyes: Negative for visual disturbance.  Respiratory: Negative for shortness of breath.   Cardiovascular: Negative for chest pain.  Gastrointestinal: Negative for abdominal pain.  Musculoskeletal: Negative for myalgias.  Skin: Positive for wound.  Neurological: Negative for headaches.  Psychiatric/Behavioral: Negative for confusion.     Physical Exam Updated Vital Signs BP (!) 156/90   Pulse 80   Temp 99 F (37.2 C)   SpO2 96%   Physical Exam  Constitutional: He is oriented to person, place, and time. He appears well-developed and well-nourished. No distress.  HENT:  Head: Normocephalic and atraumatic.  Mouth/Throat: Oropharynx is clear and moist.  Eyes: EOM are normal. Pupils are equal, round, and reactive to light.  Neck: Neck supple.  Tracheostomy and tube in place.  Cardiovascular: Normal rate, regular rhythm and normal heart sounds.   Pulmonary/Chest: Effort normal and breath sounds normal.  Abdominal: Soft. Bowel sounds are normal. There is no tenderness.  Musculoskeletal: Normal range of motion.  Neurological: He is alert and oriented to person, place, and time. No cranial nerve deficit or sensory deficit. He exhibits normal muscle tone. Coordination normal.  Skin: Skin is warm.  Nursing note and vitals reviewed.    ED Treatments / Results  Labs (all labs  ordered are listed, but only abnormal results are displayed) Labs Reviewed - No data to display  EKG  EKG Interpretation None       Radiology No results found.  Procedures Procedures (including critical care time)  Medications Ordered in ED Medications - No data to display   Initial Impression / Assessment and Plan / ED Course  I have reviewed the triage vital signs and the nursing notes.  Pertinent labs & imaging results that were available during my care of the patient were reviewed by me and considered in my medical decision making (see chart for details).    Patient sent in from nursing facility for concern of hypertension. Patient felt as if they were using the wrong cuff. EMS brought him in blood pressures were systolic in the 962 range. On arrival here 150. Patient states this is close to his normal numbers. Patient has no significant complaints. Lungs are clear bilaterally. No chest pain. Patient did request to have his trach suctioned with a respiratory therapy help him with. Patient  now wants to go back to facility.  Final Clinical Impressions(s) / ED Diagnoses   Final diagnoses:  Essential hypertension    New Prescriptions New Prescriptions   No medications on file     Fredia Sorrow, MD 05/05/16 1640

## 2016-05-05 NOTE — Discharge Instructions (Signed)
Blood pressure not significantly elevated here. Patient without any significant complaints. Patient stable for discharge back home. We did assist him with suctioning his trach which he requested. Patient the ready to return back to urgent facility.

## 2016-05-05 NOTE — ED Triage Notes (Signed)
Per EMS, patient from [facility], staff reports patient has headache r/t hypertension. Upon EMS arrival patient has no complaints. Patient requesting trach suction. EMS BP 130/80

## 2016-05-05 NOTE — ED Notes (Signed)
Bed: WA07 Expected date:  Expected time:  Means of arrival:  Comments: hpertension

## 2016-05-08 DIAGNOSIS — J9509 Other tracheostomy complication: Secondary | ICD-10-CM | POA: Diagnosis not present

## 2016-05-08 DIAGNOSIS — Z930 Tracheostomy status: Secondary | ICD-10-CM | POA: Diagnosis not present

## 2016-05-08 DIAGNOSIS — F17210 Nicotine dependence, cigarettes, uncomplicated: Secondary | ICD-10-CM | POA: Diagnosis not present

## 2016-05-29 DIAGNOSIS — L8992 Pressure ulcer of unspecified site, stage 2: Secondary | ICD-10-CM | POA: Diagnosis not present

## 2016-05-29 DIAGNOSIS — L89159 Pressure ulcer of sacral region, unspecified stage: Secondary | ICD-10-CM | POA: Diagnosis not present

## 2016-05-30 DIAGNOSIS — I739 Peripheral vascular disease, unspecified: Secondary | ICD-10-CM | POA: Diagnosis not present

## 2016-05-30 DIAGNOSIS — I7389 Other specified peripheral vascular diseases: Secondary | ICD-10-CM | POA: Diagnosis not present

## 2016-05-30 DIAGNOSIS — M216X9 Other acquired deformities of unspecified foot: Secondary | ICD-10-CM | POA: Diagnosis not present

## 2016-05-30 DIAGNOSIS — E118 Type 2 diabetes mellitus with unspecified complications: Secondary | ICD-10-CM | POA: Diagnosis not present

## 2016-05-30 DIAGNOSIS — J449 Chronic obstructive pulmonary disease, unspecified: Secondary | ICD-10-CM | POA: Diagnosis not present

## 2016-05-30 DIAGNOSIS — B351 Tinea unguium: Secondary | ICD-10-CM | POA: Diagnosis not present

## 2016-05-30 DIAGNOSIS — I5033 Acute on chronic diastolic (congestive) heart failure: Secondary | ICD-10-CM | POA: Diagnosis not present

## 2016-05-30 DIAGNOSIS — L89150 Pressure ulcer of sacral region, unstageable: Secondary | ICD-10-CM | POA: Diagnosis not present

## 2016-05-30 DIAGNOSIS — E1159 Type 2 diabetes mellitus with other circulatory complications: Secondary | ICD-10-CM | POA: Diagnosis not present

## 2016-05-30 DIAGNOSIS — Z7984 Long term (current) use of oral hypoglycemic drugs: Secondary | ICD-10-CM | POA: Diagnosis not present

## 2016-05-30 DIAGNOSIS — I890 Lymphedema, not elsewhere classified: Secondary | ICD-10-CM | POA: Diagnosis not present

## 2016-05-30 DIAGNOSIS — I509 Heart failure, unspecified: Secondary | ICD-10-CM | POA: Diagnosis not present

## 2016-05-30 DIAGNOSIS — E119 Type 2 diabetes mellitus without complications: Secondary | ICD-10-CM | POA: Diagnosis not present

## 2016-05-30 DIAGNOSIS — Z930 Tracheostomy status: Secondary | ICD-10-CM | POA: Diagnosis not present

## 2016-06-03 ENCOUNTER — Ambulatory Visit: Payer: Medicare Other | Admitting: Podiatry

## 2016-06-04 DIAGNOSIS — E119 Type 2 diabetes mellitus without complications: Secondary | ICD-10-CM | POA: Diagnosis not present

## 2016-06-04 DIAGNOSIS — L89150 Pressure ulcer of sacral region, unstageable: Secondary | ICD-10-CM | POA: Diagnosis not present

## 2016-06-04 DIAGNOSIS — I5033 Acute on chronic diastolic (congestive) heart failure: Secondary | ICD-10-CM | POA: Diagnosis not present

## 2016-06-04 DIAGNOSIS — Z7984 Long term (current) use of oral hypoglycemic drugs: Secondary | ICD-10-CM | POA: Diagnosis not present

## 2016-06-04 DIAGNOSIS — J449 Chronic obstructive pulmonary disease, unspecified: Secondary | ICD-10-CM | POA: Diagnosis not present

## 2016-06-04 DIAGNOSIS — Z930 Tracheostomy status: Secondary | ICD-10-CM | POA: Diagnosis not present

## 2016-06-06 DIAGNOSIS — I5033 Acute on chronic diastolic (congestive) heart failure: Secondary | ICD-10-CM | POA: Diagnosis not present

## 2016-06-06 DIAGNOSIS — E119 Type 2 diabetes mellitus without complications: Secondary | ICD-10-CM | POA: Diagnosis not present

## 2016-06-06 DIAGNOSIS — Z7984 Long term (current) use of oral hypoglycemic drugs: Secondary | ICD-10-CM | POA: Diagnosis not present

## 2016-06-06 DIAGNOSIS — J449 Chronic obstructive pulmonary disease, unspecified: Secondary | ICD-10-CM | POA: Diagnosis not present

## 2016-06-06 DIAGNOSIS — Z930 Tracheostomy status: Secondary | ICD-10-CM | POA: Diagnosis not present

## 2016-06-06 DIAGNOSIS — L89150 Pressure ulcer of sacral region, unstageable: Secondary | ICD-10-CM | POA: Diagnosis not present

## 2016-06-10 ENCOUNTER — Encounter (HOSPITAL_COMMUNITY): Payer: Self-pay | Admitting: Emergency Medicine

## 2016-06-10 ENCOUNTER — Emergency Department (HOSPITAL_COMMUNITY): Payer: Medicare Other

## 2016-06-10 ENCOUNTER — Emergency Department (HOSPITAL_BASED_OUTPATIENT_CLINIC_OR_DEPARTMENT_OTHER)
Admit: 2016-06-10 | Discharge: 2016-06-10 | Disposition: A | Discharge status: Home / Self Care | Payer: Medicare Other | Attending: Emergency Medicine | Admitting: Emergency Medicine

## 2016-06-10 ENCOUNTER — Inpatient Hospital Stay (HOSPITAL_COMMUNITY)
Admission: EM | Admit: 2016-06-10 | Discharge: 2016-06-21 | DRG: 291 | Payer: Medicare Other | Attending: Internal Medicine | Admitting: Internal Medicine

## 2016-06-10 DIAGNOSIS — I110 Hypertensive heart disease with heart failure: Principal | ICD-10-CM | POA: Diagnosis present

## 2016-06-10 DIAGNOSIS — R0902 Hypoxemia: Secondary | ICD-10-CM

## 2016-06-10 DIAGNOSIS — G4733 Obstructive sleep apnea (adult) (pediatric): Secondary | ICD-10-CM | POA: Diagnosis present

## 2016-06-10 DIAGNOSIS — E1159 Type 2 diabetes mellitus with other circulatory complications: Secondary | ICD-10-CM | POA: Diagnosis not present

## 2016-06-10 DIAGNOSIS — E876 Hypokalemia: Secondary | ICD-10-CM | POA: Diagnosis not present

## 2016-06-10 DIAGNOSIS — Z888 Allergy status to other drugs, medicaments and biological substances status: Secondary | ICD-10-CM

## 2016-06-10 DIAGNOSIS — R609 Edema, unspecified: Secondary | ICD-10-CM | POA: Diagnosis not present

## 2016-06-10 DIAGNOSIS — L03115 Cellulitis of right lower limb: Secondary | ICD-10-CM | POA: Diagnosis not present

## 2016-06-10 DIAGNOSIS — L89150 Pressure ulcer of sacral region, unstageable: Secondary | ICD-10-CM | POA: Diagnosis not present

## 2016-06-10 DIAGNOSIS — I5033 Acute on chronic diastolic (congestive) heart failure: Secondary | ICD-10-CM | POA: Diagnosis present

## 2016-06-10 DIAGNOSIS — I517 Cardiomegaly: Secondary | ICD-10-CM

## 2016-06-10 DIAGNOSIS — R0600 Dyspnea, unspecified: Secondary | ICD-10-CM | POA: Diagnosis not present

## 2016-06-10 DIAGNOSIS — M79672 Pain in left foot: Secondary | ICD-10-CM | POA: Diagnosis present

## 2016-06-10 DIAGNOSIS — L8990 Pressure ulcer of unspecified site, unspecified stage: Secondary | ICD-10-CM | POA: Insufficient documentation

## 2016-06-10 DIAGNOSIS — M79609 Pain in unspecified limb: Secondary | ICD-10-CM

## 2016-06-10 DIAGNOSIS — Z794 Long term (current) use of insulin: Secondary | ICD-10-CM

## 2016-06-10 DIAGNOSIS — Z7951 Long term (current) use of inhaled steroids: Secondary | ICD-10-CM

## 2016-06-10 DIAGNOSIS — S90519A Abrasion, unspecified ankle, initial encounter: Secondary | ICD-10-CM

## 2016-06-10 DIAGNOSIS — J449 Chronic obstructive pulmonary disease, unspecified: Secondary | ICD-10-CM | POA: Diagnosis not present

## 2016-06-10 DIAGNOSIS — L03119 Cellulitis of unspecified part of limb: Secondary | ICD-10-CM | POA: Diagnosis not present

## 2016-06-10 DIAGNOSIS — Z930 Tracheostomy status: Secondary | ICD-10-CM | POA: Diagnosis not present

## 2016-06-10 DIAGNOSIS — R52 Pain, unspecified: Secondary | ICD-10-CM

## 2016-06-10 DIAGNOSIS — J9621 Acute and chronic respiratory failure with hypoxia: Secondary | ICD-10-CM | POA: Diagnosis not present

## 2016-06-10 DIAGNOSIS — L03116 Cellulitis of left lower limb: Secondary | ICD-10-CM | POA: Diagnosis not present

## 2016-06-10 DIAGNOSIS — I10 Essential (primary) hypertension: Secondary | ICD-10-CM | POA: Diagnosis not present

## 2016-06-10 DIAGNOSIS — E119 Type 2 diabetes mellitus without complications: Secondary | ICD-10-CM | POA: Diagnosis not present

## 2016-06-10 DIAGNOSIS — I5031 Acute diastolic (congestive) heart failure: Secondary | ICD-10-CM

## 2016-06-10 DIAGNOSIS — Z7982 Long term (current) use of aspirin: Secondary | ICD-10-CM

## 2016-06-10 DIAGNOSIS — Z7984 Long term (current) use of oral hypoglycemic drugs: Secondary | ICD-10-CM

## 2016-06-10 DIAGNOSIS — Z6841 Body Mass Index (BMI) 40.0 and over, adult: Secondary | ICD-10-CM

## 2016-06-10 DIAGNOSIS — D472 Monoclonal gammopathy: Secondary | ICD-10-CM | POA: Diagnosis present

## 2016-06-10 DIAGNOSIS — I509 Heart failure, unspecified: Secondary | ICD-10-CM | POA: Diagnosis not present

## 2016-06-10 DIAGNOSIS — L89154 Pressure ulcer of sacral region, stage 4: Secondary | ICD-10-CM | POA: Diagnosis not present

## 2016-06-10 DIAGNOSIS — E662 Morbid (severe) obesity with alveolar hypoventilation: Secondary | ICD-10-CM | POA: Diagnosis present

## 2016-06-10 DIAGNOSIS — R0602 Shortness of breath: Secondary | ICD-10-CM | POA: Diagnosis present

## 2016-06-10 DIAGNOSIS — M21969 Unspecified acquired deformity of unspecified lower leg: Secondary | ICD-10-CM

## 2016-06-10 DIAGNOSIS — Z79899 Other long term (current) drug therapy: Secondary | ICD-10-CM

## 2016-06-10 DIAGNOSIS — E785 Hyperlipidemia, unspecified: Secondary | ICD-10-CM | POA: Diagnosis not present

## 2016-06-10 DIAGNOSIS — Z9119 Patient's noncompliance with other medical treatment and regimen: Secondary | ICD-10-CM

## 2016-06-10 LAB — CBC WITH DIFFERENTIAL/PLATELET
BASOS PCT: 0 %
Basophils Absolute: 0 10*3/uL (ref 0.0–0.1)
Eosinophils Absolute: 0.3 10*3/uL (ref 0.0–0.7)
Eosinophils Relative: 2 %
HEMATOCRIT: 39.9 % (ref 39.0–52.0)
HEMOGLOBIN: 13.8 g/dL (ref 13.0–17.0)
Lymphocytes Relative: 18 %
Lymphs Abs: 2.7 10*3/uL (ref 0.7–4.0)
MCH: 26.2 pg (ref 26.0–34.0)
MCHC: 34.6 g/dL (ref 30.0–36.0)
MCV: 75.7 fL — ABNORMAL LOW (ref 78.0–100.0)
MONO ABS: 1.4 10*3/uL — AB (ref 0.1–1.0)
Monocytes Relative: 9 %
NEUTROS ABS: 10.9 10*3/uL — AB (ref 1.7–7.7)
NEUTROS PCT: 71 %
Platelets: 264 10*3/uL (ref 150–400)
RBC: 5.27 MIL/uL (ref 4.22–5.81)
RDW: 16.1 % — ABNORMAL HIGH (ref 11.5–15.5)
WBC: 15.3 10*3/uL — ABNORMAL HIGH (ref 4.0–10.5)

## 2016-06-10 LAB — URINALYSIS, ROUTINE W REFLEX MICROSCOPIC
Bilirubin Urine: NEGATIVE
Glucose, UA: NEGATIVE mg/dL
HGB URINE DIPSTICK: NEGATIVE
KETONES UR: NEGATIVE mg/dL
NITRITE: NEGATIVE
PROTEIN: 100 mg/dL — AB
Specific Gravity, Urine: 1.013 (ref 1.005–1.030)
pH: 7 (ref 5.0–8.0)

## 2016-06-10 LAB — COMPREHENSIVE METABOLIC PANEL
ALK PHOS: 76 U/L (ref 38–126)
ALT: 23 U/L (ref 17–63)
ANION GAP: 7 (ref 5–15)
AST: 18 U/L (ref 15–41)
Albumin: 3.7 g/dL (ref 3.5–5.0)
BILIRUBIN TOTAL: 1 mg/dL (ref 0.3–1.2)
BUN: 12 mg/dL (ref 6–20)
CALCIUM: 9.1 mg/dL (ref 8.9–10.3)
CO2: 33 mmol/L — ABNORMAL HIGH (ref 22–32)
Chloride: 98 mmol/L — ABNORMAL LOW (ref 101–111)
Creatinine, Ser: 0.79 mg/dL (ref 0.61–1.24)
GFR calc non Af Amer: >60 mL/min (ref >60)
Glucose, Bld: 149 mg/dL — ABNORMAL HIGH (ref 65–99)
Potassium: 4.5 mmol/L (ref 3.5–5.1)
Sodium: 138 mmol/L (ref 135–145)
Total Protein: 7.7 g/dL (ref 6.5–8.1)

## 2016-06-10 LAB — BRAIN NATRIURETIC PEPTIDE: B Natriuretic Peptide: 31 pg/mL (ref 0.0–100.0)

## 2016-06-10 LAB — I-STAT CG4 LACTIC ACID, ED
LACTIC ACID, VENOUS: 0.41 mmol/L — AB (ref 0.5–1.9)
Lactic Acid, Venous: 0.88 mmol/L (ref 0.5–1.9)

## 2016-06-10 LAB — TROPONIN I: Troponin I: <0.03 ng/mL (ref <0.03)

## 2016-06-10 LAB — GLUCOSE, CAPILLARY: GLUCOSE-CAPILLARY: 174 mg/dL — AB (ref 65–99)

## 2016-06-10 MED ORDER — SODIUM CHLORIDE 0.9% FLUSH
3.0000 mL | Freq: Two times a day (BID) | INTRAVENOUS | Status: DC
  Administered 2016-06-10 – 2016-06-21 (×20): 3 mL via INTRAVENOUS

## 2016-06-10 MED ORDER — IPRATROPIUM-ALBUTEROL 0.5-2.5 (3) MG/3ML IN SOLN
5.0000 mL | Freq: Once | RESPIRATORY_TRACT | Status: AC
  Administered 2016-06-10: 5 mL via RESPIRATORY_TRACT
  Filled 2016-06-10: qty 3

## 2016-06-10 MED ORDER — CARVEDILOL 3.125 MG PO TABS
3.1250 mg | ORAL_TABLET | Freq: Two times a day (BID) | ORAL | Status: DC
  Filled 2016-06-10: qty 1

## 2016-06-10 MED ORDER — ATORVASTATIN CALCIUM 20 MG PO TABS
20.0000 mg | ORAL_TABLET | Freq: Every day | ORAL | Status: DC
  Administered 2016-06-11 – 2016-06-21 (×11): 20 mg via ORAL
  Filled 2016-06-10 (×11): qty 1

## 2016-06-10 MED ORDER — ONDANSETRON HCL 4 MG/2ML IJ SOLN
4.0000 mg | Freq: Four times a day (QID) | INTRAMUSCULAR | Status: DC | PRN
  Administered 2016-06-15: 4 mg via INTRAVENOUS
  Filled 2016-06-10 (×2): qty 2

## 2016-06-10 MED ORDER — ASPIRIN EC 81 MG PO TBEC
81.0000 mg | DELAYED_RELEASE_TABLET | Freq: Every day | ORAL | Status: DC
  Administered 2016-06-10 – 2016-06-21 (×12): 81 mg via ORAL
  Filled 2016-06-10 (×12): qty 1

## 2016-06-10 MED ORDER — FUROSEMIDE 10 MG/ML IJ SOLN
40.0000 mg | Freq: Once | INTRAMUSCULAR | Status: AC
  Administered 2016-06-10: 40 mg via INTRAVENOUS
  Filled 2016-06-10: qty 4

## 2016-06-10 MED ORDER — OXYCODONE-ACETAMINOPHEN 5-325 MG PO TABS
1.0000 | ORAL_TABLET | Freq: Once | ORAL | Status: AC
  Administered 2016-06-10: 1 via ORAL
  Filled 2016-06-10: qty 1

## 2016-06-10 MED ORDER — SODIUM CHLORIDE 0.9 % IV SOLN: 250.0000 mL | INTRAVENOUS | Status: DC | PRN

## 2016-06-10 MED ORDER — ACETAMINOPHEN 325 MG PO TABS
650.0000 mg | ORAL_TABLET | ORAL | Status: DC | PRN
  Administered 2016-06-10 – 2016-06-17 (×20): 650 mg via ORAL
  Filled 2016-06-10 (×20): qty 2

## 2016-06-10 MED ORDER — ENOXAPARIN SODIUM 40 MG/0.4ML ~~LOC~~ SOLN
40.0000 mg | SUBCUTANEOUS | Status: DC
  Administered 2016-06-11: 40 mg via SUBCUTANEOUS
  Filled 2016-06-10 (×2): qty 0.4

## 2016-06-10 MED ORDER — POTASSIUM CHLORIDE CRYS ER 20 MEQ PO TBCR
20.0000 meq | EXTENDED_RELEASE_TABLET | Freq: Every day | ORAL | Status: DC
  Administered 2016-06-11 – 2016-06-15 (×5): 20 meq via ORAL
  Filled 2016-06-10 (×5): qty 1

## 2016-06-10 MED ORDER — INSULIN ASPART 100 UNIT/ML ~~LOC~~ SOLN
0.0000 [IU] | Freq: Three times a day (TID) | SUBCUTANEOUS | Status: DC
  Administered 2016-06-11 (×2): 3 [IU] via SUBCUTANEOUS
  Administered 2016-06-11 – 2016-06-12 (×3): 4 [IU] via SUBCUTANEOUS
  Administered 2016-06-12: 3 [IU] via SUBCUTANEOUS
  Administered 2016-06-13 (×2): 4 [IU] via SUBCUTANEOUS
  Administered 2016-06-14 (×2): 3 [IU] via SUBCUTANEOUS
  Administered 2016-06-14: 4 [IU] via SUBCUTANEOUS
  Administered 2016-06-15: 11 [IU] via SUBCUTANEOUS
  Administered 2016-06-16: 3 [IU] via SUBCUTANEOUS
  Administered 2016-06-16: 4 [IU] via SUBCUTANEOUS
  Administered 2016-06-17 (×2): 3 [IU] via SUBCUTANEOUS
  Administered 2016-06-18: 4 [IU] via SUBCUTANEOUS
  Administered 2016-06-19: 11 [IU] via SUBCUTANEOUS
  Administered 2016-06-19: 4 [IU] via SUBCUTANEOUS
  Administered 2016-06-19: 7 [IU] via SUBCUTANEOUS
  Administered 2016-06-20 (×2): 4 [IU] via SUBCUTANEOUS
  Administered 2016-06-20: 7 [IU] via SUBCUTANEOUS
  Administered 2016-06-21 (×2): 4 [IU] via SUBCUTANEOUS

## 2016-06-10 MED ORDER — CEPHALEXIN 500 MG PO CAPS
500.0000 mg | ORAL_CAPSULE | Freq: Two times a day (BID) | ORAL | Status: DC
  Administered 2016-06-10 – 2016-06-11 (×3): 500 mg via ORAL
  Filled 2016-06-10 (×3): qty 1

## 2016-06-10 MED ORDER — POLYETHYLENE GLYCOL 3350 17 G PO PACK
17.0000 g | PACK | Freq: Two times a day (BID) | ORAL | Status: DC | PRN
  Administered 2016-06-11: 17 g via ORAL
  Filled 2016-06-10 (×2): qty 1

## 2016-06-10 MED ORDER — METFORMIN HCL 500 MG PO TABS
1000.0000 mg | ORAL_TABLET | Freq: Two times a day (BID) | ORAL | Status: DC
  Administered 2016-06-11 – 2016-06-21 (×21): 1000 mg via ORAL
  Filled 2016-06-10 (×22): qty 2

## 2016-06-10 MED ORDER — SULFAMETHOXAZOLE-TRIMETHOPRIM 800-160 MG PO TABS: 1.0000 | ORAL_TABLET | Freq: Two times a day (BID) | ORAL | 0 refills | Status: AC

## 2016-06-10 MED ORDER — SODIUM CHLORIDE 0.9% FLUSH: 3.0000 mL | INTRAVENOUS | Status: DC | PRN

## 2016-06-10 MED ORDER — ATENOLOL 25 MG PO TABS: 25.0000 mg | ORAL_TABLET | Freq: Every day | ORAL | Status: DC

## 2016-06-10 MED ORDER — FUROSEMIDE 10 MG/ML IJ SOLN
40.0000 mg | Freq: Two times a day (BID) | INTRAMUSCULAR | Status: DC
  Administered 2016-06-11 – 2016-06-13 (×5): 40 mg via INTRAVENOUS
  Filled 2016-06-10 (×5): qty 4

## 2016-06-10 NOTE — H&P (Signed)
History and Physical    [name] OBS:[id number] DOB: 12/05/1965  DOA: 06/10/2016 PCP: Boykin Nearing, MD  Patient coming from: ALF  Chief Complaint: Shortness of breast  HPI: [name] is a 51 y.o. male with medical history significant of hypertension, morbidity obese, diabetes mellitus, with trach, and questionable history of congestive heart failure. Presented to the emergency department complaining of progressive shortness of breath for the past 5 days associated symptoms include decreased urinary frequency and some chest discomfort. Patient denies any recent illness, recent travel, palpitations and dizziness. Patient also have history of COPD but denies wheezing or coughing at the time. Patient also report increasing swelling in his lower extremity and mild redness in both extremities that started about 2 days ago.  ED Course: Patient arrived with mild hypoxia, chest x-ray found to be with vascular congestion IV Lasix were given with good diuresis. Patient was going to be discharged home with cardiology follow-up but remain with hypoxia on room air. Patient is not oxygen dependent so [facility] hospitalist was consulted for admission.  Review of Systems:   General: no changes in body weight, no fever chills or decrease in energy.  HEENT: no blurry vision, hearing changes or sore throat Respiratory:  see history of present illness  CV: no chest pain, no palpitations GI: no nausea, vomiting, abdominal pain, diarrhea, constipation GU: Positive for decreased urine output. No dysuria, burning on urination, or hematuria  Ext:. No deformities,  Neuro: no unilateral weakness, numbness, or tingling, no vision change or hearing loss Skin: Positive for mild erythema in the lower extremities MSK: No muscle spasm, no deformity, no limitation of range of movement in spin Heme: No easy bruising.  Travel history: No recent long distant travel.   Past Medical History:  Diagnosis Date  . CHF  (congestive heart failure) [facility]) Dx Oct 2012  . Chronic ulcer of sacral region ([place]) 2012  . COPD (chronic obstructive pulmonary disease) ([place]) Dx 2013  . Diabetes mellitus without complication [facility]) Dx Nov 2015  . Hypertension Dx Oct 2012  . Sleep apnea Dx Oct 2011  . Ventilator dependent [facility])     Past Surgical History:  Procedure Laterality Date  . TRACHEOSTOMY TUBE PLACEMENT    . WOUND DEBRIDEMENT  12/21/2010   Procedure: DEBRIDEMENT WOUND;  Surgeon: Harl Bowie, MD;  Location: [place];  Service: General;  Laterality: N/A;  Sacral     reports that he has been smoking Cigarettes.  He has never used smokeless tobacco. He reports that he uses drugs, including Marijuana. He reports that he does not drink alcohol.  Allergies  Allergen Reactions  . Tramadol Anaphylaxis and Swelling    Tongue   . Ibuprofen Other (See Comments)    Bloody Stool   . Robaxin [Methocarbamol]     Bloody stool    Family History  Problem Relation Age of Onset  . Cancer Brother   . Chronic Renal Failure Sister     Prior to Admission medications   Medication Sig Start Date End Date Taking? Authorizing Provider  albuterol (PROVENTIL) (2.5 MG/3ML) 0.083% nebulizer solution Take 3 mLs (2.5 mg total) by nebulization every 6 (six) hours as needed for wheezing or shortness of breath. 03/10/15  Yes Funches, Josalyn, MD  atenolol (TENORMIN) 25 MG tablet Take 1 tablet (25 mg total) by mouth daily. 08/04/14  Yes Dorothy Spark, MD  atorvastatin (LIPITOR) 20 MG tablet Take 1 tablet (20 mg total) by mouth daily. 05/05/14  Yes Funches,  Josalyn, MD  furosemide (LASIX) 40 MG tablet Take 1 tablet (40 mg total) by mouth 2 (two) times daily. Take 1 tab in the morning and 1 tab at 2 pm 01/20/15  Yes Dorothy Spark, MD  metFORMIN (GLUCOPHAGE) 1000 MG tablet Take 1 tablet (1,000 mg total) by mouth 2 (two) times daily with a meal. 05/02/14  Yes Funches, Josalyn, MD  polyethylene glycol (MIRALAX / GLYCOLAX) packet Take  17 g by mouth 2 (two) times daily as needed for mild constipation. Reported on 08/03/2015   Yes [provider]  potassium chloride SA (K-DUR,KLOR-CON) 20 MEQ tablet Take 1 tablet (20 mEq total) by mouth daily. 08/04/14  Yes Dorothy Spark, MD  ACCU-CHEK FASTCLIX LANCETS MISC 1 each by Does not apply route 3 (three) times daily. E11.9 05/02/14   Funches, Adriana Mccallum, MD  acetaminophen-codeine (TYLENOL #4) 300-60 MG tablet Take 1 tablet by mouth every 6 (six) hours as needed for moderate pain. Patient not taking: Reported on 06/10/2016 11/16/15   Boykin Nearing, MD  Blood Glucose Monitoring Suppl (ACCU-CHEK NANO SMARTVIEW) W/DEVICE KIT 1 Device by Does not apply route as needed. E 11.9 05/02/14   Funches, Josalyn, MD  glucose blood (ACCU-CHEK SMARTVIEW) test strip 1 each by Other route 3 (three) times daily. E11.9 05/02/14   Funches, Adriana Mccallum, MD  sulfamethoxazole-trimethoprim (BACTRIM DS,SEPTRA DS) 800-160 MG tablet Take 1 tablet by mouth 2 (two) times daily. 06/10/16 06/17/16  Varney Biles, MD    Physical Exam: Vitals:   06/10/16 1700 06/10/16 1728 06/10/16 1809 06/10/16 1831  BP:  (!) 153/93  (!) 154/90  Pulse:  75  75  Resp:  16  20  Temp:      TempSrc:      SpO2: 91% 91% 92% 96%  Weight:      Height:         Constitutional: NAD, calm, comfortable Eyes: PERRL, lids and conjunctivae normal ENMT:Mucous membranes moist, oropharynx clear Neck: trach in place with no secretions Respiratory: Difficult auscultation due to body habitus although mild crackles at the mid fields  Cardiovascular: S1-S2 RRR, unable to be swollen a JVD due to body habitus, 1+ pitting edema bilaterally Abdomen: Abdomen obese, soft mild epigastric tenderness. Musculoskeletal: Good range of motion, no deformities. Skin: Mild erythema bilaterally in the left lower extremity right more than left nontender no warmness Neurologic: CN 2-12 grossly intact.  Psychiatric: Normal judgment and insight. Alert and oriented  x 3. Normal mood.    Labs on Admission: I have personally reviewed following labs and imaging studies  CBC:  Recent Labs Lab 06/10/16 1307  WBC 15.3*  NEUTROABS 10.9*  HGB 13.8  HCT 39.9  MCV 75.7*  PLT 295   Basic Metabolic Panel:  Recent Labs Lab 06/10/16 1307  NA 138  K 4.5  CL 98*  CO2 33*  GLUCOSE 149*  BUN 12  CREATININE 0.79  CALCIUM 9.1   GFR: Estimated Creatinine Clearance: 176.7 mL/min (by C-G formula based on SCr of 0.79 mg/dL). Liver Function Tests:  Recent Labs Lab 06/10/16 1307  AST 18  ALT 23  ALKPHOS 76  BILITOT 1.0  PROT 7.7  ALBUMIN 3.7   No results for input(s): LIPASE, AMYLASE in the last 168 hours. No results for input(s): AMMONIA in the last 168 hours. Coagulation Profile: No results for input(s): INR, PROTIME in the last 168 hours. Cardiac Enzymes: No results for input(s): CKTOTAL, CKMB, CKMBINDEX, TROPONINI in the last 168 hours. BNP (last 3 results) No results  for input(s): PROBNP in the last 8760 hours. HbA1C: No results for input(s): HGBA1C in the last 72 hours. CBG: No results for input(s): GLUCAP in the last 168 hours. Lipid Profile: No results for input(s): CHOL, HDL, LDLCALC, TRIG, CHOLHDL, LDLDIRECT in the last 72 hours. Thyroid Function Tests: No results for input(s): TSH, T4TOTAL, FREET4, T3FREE, THYROIDAB in the last 72 hours. Anemia Panel: No results for input(s): VITAMINB12, FOLATE, FERRITIN, TIBC, IRON, RETICCTPCT in the last 72 hours. Urine analysis:    Component Value Date/Time   COLORURINE YELLOW 06/10/2016 1254   APPEARANCEUR CLEAR 06/10/2016 1254   LABSPEC 1.013 06/10/2016 1254   PHURINE 7.0 06/10/2016 1254   GLUCOSEU NEGATIVE 06/10/2016 1254   HGBUR NEGATIVE 06/10/2016 1254   BILIRUBINUR NEGATIVE 06/10/2016 1254   KETONESUR NEGATIVE 06/10/2016 1254   PROTEINUR 100 (A) 06/10/2016 1254   UROBILINOGEN 2.0 (H) 12/13/2010 0430   NITRITE NEGATIVE 06/10/2016 1254   LEUKOCYTESUR TRACE (A) 06/10/2016 1254     Sepsis Labs: !!!!!!!!!!!!!!!!!!!!!!!!!!!!!!!!!!!!!!!!!!!! _0 (procalcitonin:4,lacticidven:4) )No results found for this or any previous visit (from the past 240 hour(s)).   Radiological Exams on Admission: Dg Chest Port 1 View  Result Date: 06/10/2016 CLINICAL DATA:  Hypoxia, dyspnea EXAM: PORTABLE CHEST 1 VIEW COMPARISON:  01/26/2015 chest radiograph. FINDINGS: Tracheostomy tube tip overlies the tracheal air column just below the thoracic inlet. Stable cardiomediastinal silhouette with mild cardiomegaly. No pneumothorax. No pleural effusion. Mild-to-moderate pulmonary edema. IMPRESSION: Mild-to-moderate congestive heart failure. Electronically Signed   By: Ilona Sorrel M.D.   On: 06/10/2016 15:27    Chest x-ray - Images independently reviewed by me: Increased pulmonary congestion and cardiomegaly  Assessment/Plan Acute respiratory failure due to acute congestive heart failure, unclear if diastolic or systolic this time. Patient had echo done on 2016 which showed normal EF with normal diastolic function.  Clinically presenting with symptoms of congestive heart failure shortness of breath, chest x-ray with pulmonary congestion and lower extremity edema. BNP unaccurate due to morbid obesity. Admitted to Obs/telemetry Lasix 40 mg IV twice a day Will obtain echo, EKG, TNI's Low-salt diet, strict I&O's, daily weights. BP control patient on atenolol at home will switch to Coreg titrate as tolerated  O2 supplement as needed, keep saturation above 92% If patient diuresed well and maintain saturations can be discharged home in the morning.  Lower extremity erythema, there was concern for cellulitis although likely no warm or tenderness Mild elevated WBC, there is maybe a superimpose cellulitis on top of venous insufficiency We'll continue Keflex for now Lower extremity Doppler, negative for DVT  Type 2 diabetes mellitus Continue metformin SSI and monitor CBGs  Essential  hypertension On Tenormin at home will switch for Coreg given CHF exacerbation Monitor BP  COPD - doesn't seem to be acute exacerbation, now significant wheezing. Patient is trach, without oxygen dependence We'll keep DuoNeb's when necessary O2 as needed and wean as tolerated. Trache care per nursing protocol   Morbid obesity Dietitian consult  DVT prophylaxis: Lovenox Code Status: Full code Family Communication: None at bedside Disposition Plan: Anticipate discharge to previous home environment.  Consults called: None  Admission status: Obs/Tele    Chipper Oman MD [facility] Pager: Text Page via [email or web address]  [phone]  If 7PM-7AM, please contact night-coverage [email or web address] Password [facility]  06/10/2016, 6:52 PM

## 2016-06-10 NOTE — ED Triage Notes (Addendum)
Pt verbalizes increased leg swelling and skin darkening since Friday; pain worse to LLE. Pt oxygen saturation high 80s low 90s on RA and by dyspnea at rest.

## 2016-06-10 NOTE — ED Provider Notes (Addendum)
[facility] DEPT Provider Note   CSN: [id number] Arrival date & time: 06/10/16  1233     History   Chief Complaint Chief Complaint  Patient presents with  . Leg Swelling    HPI [name] is a 51 y.o. male.  HPI Pt with morbid obesity, diastolic CHF, COPD, HTN, HLD, DM2, obesity hypoventilation syndrome s/p trach .presents to the ER with cc of leg swelling. Pt reports that over the past 3 days, his legs has had increased swelling and redness and pain. No n/v/f/c. Pt has no hx of PE, DVT and denies any exogenous hormone (testosterone / estrogen) use, long distance travels or surgery in the past 6 weeks, active cancer, recent immobilization.  Pt noted to be having mild dib and was hypoxic. He denies any new cough, wheezing. Pt reports that his exertional dib is nor worse than before and there is no chest pain. He does feel it is hard to get air in.   Past Medical History:  Diagnosis Date  . CHF (congestive heart failure) [facility]) Dx Oct 2012  . Chronic ulcer of sacral region ([place]) 2012  . COPD (chronic obstructive pulmonary disease) ([place]) Dx 2013  . Diabetes mellitus without complication [facility]) Dx Nov 2015  . Hypertension Dx Oct 2012  . Sleep apnea Dx Oct 2011  . Ventilator dependent [facility])     Patient Active Problem List   Diagnosis Date Noted  . Pain of left hip joint 11/21/2015  . Bilateral chronic knee pain 07/25/2015  . Tracheostomy dependent ([place]) 03/10/2015  . COPD (chronic obstructive pulmonary disease) ([place])   . Essential hypertension 01/20/2015  . DOE (dyspnea on exertion) 01/20/2015  . Toenail fungus 07/12/2014  . Acute on chronic diastolic CHF (congestive heart failure), NYHA class 1 ([place]) 06/15/2014  . Hyperlipidemia associated with type 2 diabetes mellitus ([place]) 05/05/2014  . Cardiomegaly 05/02/2014  . Nasal congestion 05/02/2014  . Current smoker 05/02/2014  . Severe obesity (BMI >= 40) ([place]) 11/28/2013  . Type II diabetes mellitus ([place]) 11/28/2013  .  Left anterior fascicular block 11/27/2013  . Poor social situation 11/27/2013  . Monoclonal gammopathy 12/14/2010  . Decubitus ulcer of sacral area 12/10/2010  . Obesity hypoventilation syndrome ([place]) 12/09/2010    Past Surgical History:  Procedure Laterality Date  . TRACHEOSTOMY TUBE PLACEMENT    . WOUND DEBRIDEMENT  12/21/2010   Procedure: DEBRIDEMENT WOUND;  Surgeon: Harl Bowie, MD;  Location: [place];  Service: General;  Laterality: N/A;  Sacral       Home Medications    Prior to Admission medications   Medication Sig Start Date End Date Taking? Authorizing Provider  albuterol (PROVENTIL) (2.5 MG/3ML) 0.083% nebulizer solution Take 3 mLs (2.5 mg total) by nebulization every 6 (six) hours as needed for wheezing or shortness of breath. 03/10/15  Yes Funches, Josalyn, MD  atenolol (TENORMIN) 25 MG tablet Take 1 tablet (25 mg total) by mouth daily. 08/04/14  Yes Dorothy Spark, MD  atorvastatin (LIPITOR) 20 MG tablet Take 1 tablet (20 mg total) by mouth daily. 05/05/14  Yes Funches, Josalyn, MD  furosemide (LASIX) 40 MG tablet Take 1 tablet (40 mg total) by mouth 2 (two) times daily. Take 1 tab in the morning and 1 tab at 2 pm 01/20/15  Yes Dorothy Spark, MD  metFORMIN (GLUCOPHAGE) 1000 MG tablet Take 1 tablet (1,000 mg total) by mouth 2 (two) times daily with a meal. 05/02/14  Yes Funches, Adriana Mccallum, MD  polyethylene glycol (MIRALAX /  GLYCOLAX) packet Take 17 g by mouth 2 (two) times daily as needed for mild constipation. Reported on 08/03/2015   Yes [provider]  potassium chloride SA (K-DUR,KLOR-CON) 20 MEQ tablet Take 1 tablet (20 mEq total) by mouth daily. 08/04/14  Yes Dorothy Spark, MD  ACCU-CHEK FASTCLIX LANCETS MISC 1 each by Does not apply route 3 (three) times daily. E11.9 05/02/14   Funches, Adriana Mccallum, MD  acetaminophen-codeine (TYLENOL #4) 300-60 MG tablet Take 1 tablet by mouth every 6 (six) hours as needed for moderate pain. Patient not taking:  Reported on 06/10/2016 11/16/15   Boykin Nearing, MD  Blood Glucose Monitoring Suppl (ACCU-CHEK NANO SMARTVIEW) W/DEVICE KIT 1 Device by Does not apply route as needed. E 11.9 05/02/14   Funches, Josalyn, MD  glucose blood (ACCU-CHEK SMARTVIEW) test strip 1 each by Other route 3 (three) times daily. E11.9 05/02/14   Funches, Adriana Mccallum, MD  sulfamethoxazole-trimethoprim (BACTRIM DS,SEPTRA DS) 800-160 MG tablet Take 1 tablet by mouth 2 (two) times daily. 06/10/16 06/17/16  Varney Biles, MD    Family History Family History  Problem Relation Age of Onset  . Cancer Brother   . Chronic Renal Failure Sister     Social History Social History  Substance Use Topics  . Smoking status: Current Every Day Smoker    Types: Cigarettes  . Smokeless tobacco: Never Used  . Alcohol use No     Allergies   Tramadol; Ibuprofen; and Robaxin [methocarbamol]   Review of Systems Review of Systems  All other systems reviewed and are negative.    Physical Exam Updated Vital Signs BP (!) 153/93   Pulse 75   Temp 99.7 F (37.6 C) (Oral)   Resp 16   Ht 5' 7"  (1.702 m)   Wt (!) 405 lb (183.7 kg)   SpO2 91%   BMI 63.43 kg/m   Physical Exam  Constitutional: He is oriented to person, place, and time. He appears well-developed.  HENT:  Head: Normocephalic and atraumatic.  Eyes: Conjunctivae and EOM are normal. Pupils are equal, round, and reactive to light.  Neck: Normal range of motion. Neck supple. No JVD present.  Cardiovascular: Normal rate and regular rhythm.   Pulmonary/Chest: Effort normal and breath sounds normal. He has no wheezes.  Abdominal: Soft. Bowel sounds are normal. He exhibits no distension and no mass. There is no tenderness. There is no rebound and no guarding.  Musculoskeletal: He exhibits no deformity.  Neurological: He is alert and oriented to person, place, and time.  Skin: Skin is warm. There is erythema.  Pitting edema 1+ bilaterally  Nursing note and vitals  reviewed.    ED Treatments / Results  Labs (all labs ordered are listed, but only abnormal results are displayed) Labs Reviewed  COMPREHENSIVE METABOLIC PANEL - Abnormal; Notable for the following:       Result Value   Chloride 98 (*)    CO2 33 (*)    Glucose, Bld 149 (*)    All other components within normal limits  CBC WITH DIFFERENTIAL/PLATELET - Abnormal; Notable for the following:    WBC 15.3 (*)    MCV 75.7 (*)    RDW 16.1 (*)    Neutro Abs 10.9 (*)    Monocytes Absolute 1.4 (*)    All other components within normal limits  URINALYSIS, ROUTINE W REFLEX MICROSCOPIC - Abnormal; Notable for the following:    Protein, ur 100 (*)    Leukocytes, UA TRACE (*)    Bacteria, UA  FEW (*)    Squamous Epithelial / LPF 0-5 (*)    All other components within normal limits  I-STAT CG4 LACTIC ACID, ED - Abnormal; Notable for the following:    Lactic Acid, Venous 0.41 (*)    All other components within normal limits  BRAIN NATRIURETIC PEPTIDE  I-STAT CG4 LACTIC ACID, ED    EKG  EKG Interpretation None       Radiology Dg Chest Port 1 View  Result Date: 06/10/2016 CLINICAL DATA:  Hypoxia, dyspnea EXAM: PORTABLE CHEST 1 VIEW COMPARISON:  01/26/2015 chest radiograph. FINDINGS: Tracheostomy tube tip overlies the tracheal air column just below the thoracic inlet. Stable cardiomediastinal silhouette with mild cardiomegaly. No pneumothorax. No pleural effusion. Mild-to-moderate pulmonary edema. IMPRESSION: Mild-to-moderate congestive heart failure. Electronically Signed   By: Ilona Sorrel M.D.   On: 06/10/2016 15:27    Procedures Procedures (including critical care time)  Medications Ordered in ED Medications  ipratropium-albuterol (DUONEB) 0.5-2.5 (3) MG/3ML nebulizer solution 5 mL (not administered)  furosemide (LASIX) injection 40 mg (40 mg Intravenous Given 06/10/16 1606)  oxyCODONE-acetaminophen (PERCOCET/ROXICET) 5-325 MG per tablet 1 tablet (1 tablet Oral Given 06/10/16 1708)      Initial Impression / Assessment and Plan / ED Course  I have reviewed the triage vital signs and the nursing notes.  Pertinent labs & imaging results that were available during my care of the patient were reviewed by me and considered in my medical decision making (see chart for details).  Clinical Course as of Jun 10 1728  Mon Jun 10, 2016  1500 RT gave nebulized air treatment, and pt felt a lot better. His O2 sats improved and his RR went down to < 20.  [AN]  1638 Post lasix, patient has had 600 cc of urine output. We called Cardiology, and they will provide close follow up.  Pt also has leg redness, elevated WC - so possibly there is a cellulitis. Bactrim and keflex given.  [place] is limited, but didn't show any proximal DVT.  [AN]  1729 At the time of d/c - pt reports that his breathing still doesn't feel baseline. He is noted to be 92% on 2 liters. He normally is not on any O2. We will give him nebs. Admit to obs.  [AN]    Clinical Course User Index [AN] Varney Biles, MD    Pt comes in with cc of leg swelling and redness. I did a DVT scan, and it is neg for acute DVT. Exam limited due to the body habitus, however, the pretest probability for bilateral DVT is already low, so I am comfortable with the study results of no DVT> We will tx the swelling as cellulitis.   Pt had some dib, which improved with nebulized air treatment. CXR does show some pulm congestion, and there is mild swelling in the leg, so CHF also possible. BNP ordered however, and is neg. Pt therefore given a dose of lasix, and he responded well. Pt is not in resp failure, and so we decided to manage him as an outpatient cards patient. I spoke with cardiology, and they will set up a close f/u.  Strict ER return precautions have been discussed, and patient is agreeing with the plan and is comfortable with the workup done and the recommendations from the ER.   Final Clinical Impressions(s) / ED Diagnoses    Final diagnoses:  Cellulitis of lower extremity, unspecified laterality  Acute diastolic CHF (congestive heart failure) ([place])    New Prescriptions  New Prescriptions   SULFAMETHOXAZOLE-TRIMETHOPRIM (BACTRIM DS,SEPTRA DS) 800-160 MG TABLET    Take 1 tablet by mouth 2 (two) times daily.     Varney Biles, MD 06/10/16 [place], Yuridia Couts, MD 06/10/16 1730

## 2016-06-10 NOTE — ED Notes (Signed)
Bed: EF20 Expected date:  Expected time:  Means of arrival:  Comments: RES  b

## 2016-06-10 NOTE — ED Notes (Signed)
Call to 4E bed not assigned yet per secreatary

## 2016-06-10 NOTE — Discharge Instructions (Signed)
Expect the Cardiologist to see call you for an appointment. It seems like the leg swelling is from CHF.  Take the medicine prescribed for possible infection.

## 2016-06-10 NOTE — Progress Notes (Signed)
*  PRELIMINARY RESULTS* Vascular Ultrasound Bilateral lower extremity venous duplex has been completed.  Preliminary findings: No evidence of deep vein thrombosis in the visualized veins of the lower extremities.  Difficult exam due to patient body habitus and upright position (difficulty breathing).   Preliminary results given to Dr. Thelma Comp @ 14:55  [name] 06/10/2016, 2:56 PM

## 2016-06-11 ENCOUNTER — Observation Stay (HOSPITAL_BASED_OUTPATIENT_CLINIC_OR_DEPARTMENT_OTHER): Payer: Medicare Other

## 2016-06-11 DIAGNOSIS — L89154 Pressure ulcer of sacral region, stage 4: Secondary | ICD-10-CM | POA: Diagnosis present

## 2016-06-11 DIAGNOSIS — Z79899 Other long term (current) drug therapy: Secondary | ICD-10-CM | POA: Diagnosis not present

## 2016-06-11 DIAGNOSIS — M19072 Primary osteoarthritis, left ankle and foot: Secondary | ICD-10-CM | POA: Diagnosis not present

## 2016-06-11 DIAGNOSIS — I5031 Acute diastolic (congestive) heart failure: Secondary | ICD-10-CM

## 2016-06-11 DIAGNOSIS — E662 Morbid (severe) obesity with alveolar hypoventilation: Secondary | ICD-10-CM | POA: Diagnosis present

## 2016-06-11 DIAGNOSIS — Z7984 Long term (current) use of oral hypoglycemic drugs: Secondary | ICD-10-CM | POA: Diagnosis not present

## 2016-06-11 DIAGNOSIS — R0902 Hypoxemia: Secondary | ICD-10-CM | POA: Diagnosis not present

## 2016-06-11 DIAGNOSIS — L8994 Pressure ulcer of unspecified site, stage 4: Secondary | ICD-10-CM | POA: Diagnosis not present

## 2016-06-11 DIAGNOSIS — Z794 Long term (current) use of insulin: Secondary | ICD-10-CM | POA: Diagnosis not present

## 2016-06-11 DIAGNOSIS — M79672 Pain in left foot: Secondary | ICD-10-CM | POA: Diagnosis not present

## 2016-06-11 DIAGNOSIS — Z6841 Body Mass Index (BMI) 40.0 and over, adult: Secondary | ICD-10-CM | POA: Diagnosis not present

## 2016-06-11 DIAGNOSIS — J9621 Acute and chronic respiratory failure with hypoxia: Secondary | ICD-10-CM | POA: Diagnosis not present

## 2016-06-11 DIAGNOSIS — Z888 Allergy status to other drugs, medicaments and biological substances status: Secondary | ICD-10-CM | POA: Diagnosis not present

## 2016-06-11 DIAGNOSIS — Z7951 Long term (current) use of inhaled steroids: Secondary | ICD-10-CM | POA: Diagnosis not present

## 2016-06-11 DIAGNOSIS — L03119 Cellulitis of unspecified part of limb: Secondary | ICD-10-CM | POA: Diagnosis not present

## 2016-06-11 DIAGNOSIS — I5033 Acute on chronic diastolic (congestive) heart failure: Secondary | ICD-10-CM | POA: Diagnosis not present

## 2016-06-11 DIAGNOSIS — J9611 Chronic respiratory failure with hypoxia: Secondary | ICD-10-CM | POA: Diagnosis not present

## 2016-06-11 DIAGNOSIS — Z7982 Long term (current) use of aspirin: Secondary | ICD-10-CM | POA: Diagnosis not present

## 2016-06-11 DIAGNOSIS — E118 Type 2 diabetes mellitus with unspecified complications: Secondary | ICD-10-CM

## 2016-06-11 DIAGNOSIS — D472 Monoclonal gammopathy: Secondary | ICD-10-CM | POA: Diagnosis present

## 2016-06-11 DIAGNOSIS — Z930 Tracheostomy status: Secondary | ICD-10-CM

## 2016-06-11 DIAGNOSIS — Z9119 Patient's noncompliance with other medical treatment and regimen: Secondary | ICD-10-CM | POA: Diagnosis not present

## 2016-06-11 DIAGNOSIS — E119 Type 2 diabetes mellitus without complications: Secondary | ICD-10-CM | POA: Diagnosis present

## 2016-06-11 DIAGNOSIS — J449 Chronic obstructive pulmonary disease, unspecified: Secondary | ICD-10-CM | POA: Diagnosis present

## 2016-06-11 DIAGNOSIS — M7989 Other specified soft tissue disorders: Secondary | ICD-10-CM | POA: Diagnosis not present

## 2016-06-11 DIAGNOSIS — G4733 Obstructive sleep apnea (adult) (pediatric): Secondary | ICD-10-CM | POA: Diagnosis present

## 2016-06-11 DIAGNOSIS — I110 Hypertensive heart disease with heart failure: Secondary | ICD-10-CM | POA: Diagnosis present

## 2016-06-11 DIAGNOSIS — I10 Essential (primary) hypertension: Secondary | ICD-10-CM | POA: Diagnosis not present

## 2016-06-11 LAB — BASIC METABOLIC PANEL
Anion gap: 6 (ref 5–15)
BUN: 14 mg/dL (ref 6–20)
CHLORIDE: 94 mmol/L — AB (ref 101–111)
CO2: 37 mmol/L — ABNORMAL HIGH (ref 22–32)
Calcium: 9.4 mg/dL (ref 8.9–10.3)
Creatinine, Ser: 0.8 mg/dL (ref 0.61–1.24)
GFR calc non Af Amer: >60 mL/min (ref >60)
Glucose, Bld: 172 mg/dL — ABNORMAL HIGH (ref 65–99)
POTASSIUM: 4.3 mmol/L (ref 3.5–5.1)
SODIUM: 137 mmol/L (ref 135–145)

## 2016-06-11 LAB — TROPONIN I

## 2016-06-11 LAB — GLUCOSE, CAPILLARY
Glucose-Capillary: 145 mg/dL — ABNORMAL HIGH (ref 65–99)
Glucose-Capillary: 164 mg/dL — ABNORMAL HIGH (ref 65–99)
Glucose-Capillary: 137 mg/dL — ABNORMAL HIGH (ref 65–99)
Glucose-Capillary: 152 mg/dL — ABNORMAL HIGH (ref 65–99)

## 2016-06-11 LAB — ECHOCARDIOGRAM COMPLETE
HEIGHTINCHES: 67 in
Weight: 6908.8 oz

## 2016-06-11 LAB — MRSA PCR SCREENING: MRSA by PCR: NEGATIVE

## 2016-06-11 LAB — HIV ANTIBODY (ROUTINE TESTING W REFLEX): HIV Screen 4th Generation wRfx: NONREACTIVE

## 2016-06-11 MED ORDER — ADULT MULTIVITAMIN W/MINERALS CH
1.0000 | ORAL_TABLET | Freq: Every day | ORAL | Status: DC
  Administered 2016-06-11 – 2016-06-21 (×11): 1 via ORAL
  Filled 2016-06-11 (×11): qty 1

## 2016-06-11 MED ORDER — PREMIER PROTEIN SHAKE
11.0000 [oz_av] | Freq: Three times a day (TID) | ORAL | Status: DC
  Administered 2016-06-11 – 2016-06-21 (×21): 11 [oz_av] via ORAL
  Filled 2016-06-11 (×32): qty 325.31

## 2016-06-11 MED ORDER — PERFLUTREN LIPID MICROSPHERE
1.0000 mL | INTRAVENOUS | Status: AC | PRN
  Administered 2016-06-11: 2 mL via INTRAVENOUS
  Filled 2016-06-11: qty 10

## 2016-06-11 MED ORDER — PREMIER PROTEIN SHAKE
11.0000 [oz_av] | Freq: Two times a day (BID) | ORAL | Status: DC
  Filled 2016-06-11: qty 325.31

## 2016-06-11 MED ORDER — CARVEDILOL 6.25 MG PO TABS
6.2500 mg | ORAL_TABLET | Freq: Two times a day (BID) | ORAL | Status: DC
  Administered 2016-06-11 – 2016-06-21 (×21): 6.25 mg via ORAL
  Filled 2016-06-11 (×21): qty 1

## 2016-06-11 MED ORDER — CARVEDILOL 6.25 MG PO TABS: 6.2500 mg | ORAL_TABLET | Freq: Two times a day (BID) | ORAL | Status: DC

## 2016-06-11 NOTE — Progress Notes (Signed)
Upon discussing trach care with patient, he stated he performs all of his care himself. He refused to allow me to perform any care. All emergency equipment set up at bedside by this RN and respiratory therapist. Will continue to monitor patient.

## 2016-06-11 NOTE — Progress Notes (Signed)
PROGRESS NOTE [facility]   [name]   ZCH:[id number] DOB: 26-Oct-1965  DOA: 06/10/2016 PCP: Boykin Nearing, MD   Brief Narrative:  [name] is a 51 y.o. male with medical history significant of hypertension, morbidity obese, diabetes mellitus, with trach, and questionable history of congestive heart failure. Presented to the emergency department complaining of progressive shortness of breath for the past 5 days PTA associated symptoms include decreased urinary frequency and some chest discomfort. Patient also have history of COPD but denies wheezing or coughing at the time. Patient also report increasing swelling in his lower extremity and mild redness in both extremities that started about 2 days PTA. Patient admitted with concerns of CHF exacerbation and ? Cellulitis.  Subjective: Patient seen and examined doing well, although continues with increase in O2 requirements. Patient did not received Lasix overnight. Weight is up. He report breathing is better. No acute events overnight.   Assessment & Plan: Acute respiratory failure due to ? acute congestive heart failure, unclear if diastolic or systolic this time. Patient had echo done on 2016 which showed normal EF with normal diastolic function.  Clinically presenting with symptoms of congestive heart failure shortness of breath, chest x-ray with pulmonary congestion and lower extremity edema. BNP unaccurate due to morbid obesity. High salt diet and heavy water drinker ~ 6L a day  Lasix 40 mg IV twice a day TNIs negative EKG independently reviewed and interpreted as follow NSR with QTc prolongation   ECHO pending  Low-salt diet, strict I&O's, daily weights. BP control Atelonol switched to Coreg, titrate as tolerated  Wean O2 as tolerated keep O2 sat > 91%  Lower extremity erythema, there is a concern for cellulitis although unlikely Mild elevated WBC, there is maybe a superimpose cellulitis on top of venous  insufficiency We'll continue Keflex for now Lower extremity Doppler, negative for DVT  Type 2 diabetes mellitus - CBG's stable  Continue metformin SSI and monitor CBGs  Essential hypertension - BP slight above goal  Tenormin was changed to coreg in view of possible systolic abnormalities. Adjust Coreg as tolerated  Monitor BP  COPD - doesn't seem to be on acute exacerbation Patient is trach, without oxygen dependence We'll keep DuoNeb's when necessary Wean O2 as tolerated Trach care per nursing protocol   Healing stage 4 sacral ulcer - POA Wound care recommendations appreciated   Morbid obesity Dietitian consult  DVT prophylaxis: Lovenox Code Status: Full code Family Communication: None at bedside Disposition Plan: Anticipate discharge to previous home environment.    Consultants:   None   Procedures:   ECHO 06/11/16  Antimicrobials: Antibiotics Given (last 72 hours)    Date/Time Action Medication Dose   06/10/16 2346 Given   cephALEXin (KEFLEX) capsule 500 mg 500 mg   06/11/16 0824 Given   cephALEXin (KEFLEX) capsule 500 mg 500 mg        Objective: Vitals:   06/10/16 2346 06/11/16 0406 06/11/16 0659 06/11/16 1332  BP:   (!) 149/92 (!) 146/87  Pulse:  68 73 83  Resp:  19 20 18   Temp:   98.4 F (36.9 C) 98.4 F (36.9 C)  TempSrc:   Oral Oral  SpO2:  92% 95% 93%  Weight:   (!) 195.9 kg (431 lb 12.8 oz)   Height: 5\' 7"  (1.702 m)       Intake/Output Summary (Last 24 hours) at 06/11/16 1608 Last data filed at 06/11/16 1035  Gross per 24 hour  Intake  780 ml  Output             3225 ml  Net            -2445 ml   Filed Weights   06/10/16 1252 06/11/16 0659  Weight: (!) 183.7 kg (405 lb) (!) 195.9 kg (431 lb 12.8 oz)    Examination:  General exam: sitting up in chair  Neck: Trach in place, white mucus production  Respiratory system: Distant breath sounds due to body habitus difficult auscultation  Cardiovascular system: S1 & S2  heard, RRR. No JVD, murmurs, rubs or gallops Gastrointestinal system: Obese Abdomen is soft and nontender. Normal bowel sounds heard. Central nervous system: Alert and oriented. No focal neurological deficits. Extremities: B/l Edema, tenderness to palpation, mild erythema R> L Skin: See wound care note for detail, sacral ulcer  Psychiatry: Judgement and insight appear normal. Mood & affect appropriate.    Data Reviewed: I have personally reviewed following labs and imaging studies  CBC:  Recent Labs Lab 06/10/16 1307  WBC 15.3*  NEUTROABS 10.9*  HGB 13.8  HCT 39.9  MCV 75.7*  PLT 300   Basic Metabolic Panel:  Recent Labs Lab 06/10/16 1307 06/11/16 0519  NA 138 137  K 4.5 4.3  CL 98* 94*  CO2 33* 37*  GLUCOSE 149* 172*  BUN 12 14  CREATININE 0.79 0.80  CALCIUM 9.1 9.4   GFR: Estimated Creatinine Clearance: 184.4 mL/min (by C-G formula based on SCr of 0.8 mg/dL). Liver Function Tests:  Recent Labs Lab 06/10/16 1307  AST 18  ALT 23  ALKPHOS 76  BILITOT 1.0  PROT 7.7  ALBUMIN 3.7   No results for input(s): LIPASE, AMYLASE in the last 168 hours. No results for input(s): AMMONIA in the last 168 hours. Coagulation Profile: No results for input(s): INR, PROTIME in the last 168 hours. Cardiac Enzymes:  Recent Labs Lab 06/10/16 2320 06/11/16 0519 06/11/16 1125  TROPONINI <0.03 <0.03 <0.03   BNP (last 3 results) No results for input(s): PROBNP in the last 8760 hours. HbA1C: No results for input(s): HGBA1C in the last 72 hours. CBG:  Recent Labs Lab 06/10/16 2314 06/11/16 0737 06/11/16 1144  GLUCAP 174* 145* 164*   Lipid Profile: No results for input(s): CHOL, HDL, LDLCALC, TRIG, CHOLHDL, LDLDIRECT in the last 72 hours. Thyroid Function Tests: No results for input(s): TSH, T4TOTAL, FREET4, T3FREE, THYROIDAB in the last 72 hours. Anemia Panel: No results for input(s): VITAMINB12, FOLATE, FERRITIN, TIBC, IRON, RETICCTPCT in the last 72  hours. Sepsis Labs:  Recent Labs Lab 06/10/16 1317 06/10/16 1617  LATICACIDVEN 0.88 0.41*    Recent Results (from the past 240 hour(s))  MRSA PCR Screening     Status: None   Collection Time: 06/11/16  1:47 AM  Result Value Ref Range Status   MRSA by PCR NEGATIVE NEGATIVE Final    Comment:        The GeneXpert MRSA Assay (FDA approved for NASAL specimens only), is one component of a comprehensive MRSA colonization surveillance program. It is not intended to diagnose MRSA infection nor to guide or monitor treatment for MRSA infections.      Radiology Studies: Dg Chest Port 1 View  Result Date: 06/10/2016 CLINICAL DATA:  Hypoxia, dyspnea EXAM: PORTABLE CHEST 1 VIEW COMPARISON:  01/26/2015 chest radiograph. FINDINGS: Tracheostomy tube tip overlies the tracheal air column just below the thoracic inlet. Stable cardiomediastinal silhouette with mild cardiomegaly. No pneumothorax. No pleural effusion. Mild-to-moderate pulmonary edema. IMPRESSION: Mild-to-moderate congestive  heart failure. Electronically Signed   By: Ilona Sorrel M.D.   On: 06/10/2016 15:27    Scheduled Meds: . aspirin EC  81 mg Oral Daily  . atorvastatin  20 mg Oral Daily  . carvedilol  6.25 mg Oral BID WC  . cephALEXin  500 mg Oral Q12H  . enoxaparin (LOVENOX) injection  40 mg Subcutaneous Q24H  . furosemide  40 mg Intravenous BID  . insulin aspart  0-20 Units Subcutaneous TID WC  . metFORMIN  1,000 mg Oral BID WC  . multivitamin with minerals  1 tablet Oral Daily  . potassium chloride SA  20 mEq Oral Daily  . protein supplement shake  11 oz Oral TID BM  . sodium chloride flush  3 mL Intravenous Q12H   Continuous Infusions: . sodium chloride       LOS: 0 days     Chipper Oman, MD Pager: Text Page via [email or web address]  [phone]  If 7PM-7AM, please contact night-coverage [email or web address] Password [email or web address] 06/11/2016, 4:08 PM

## 2016-06-11 NOTE — Progress Notes (Signed)
Rt asked pt to return TC for humidity and he stated he would do it later.  Rt checked sats and they bounced from 82-86% and pt stated 'If it ain't broken, we don't need to fix it' and refused O2 at this time.  Rt will monitor.

## 2016-06-11 NOTE — Progress Notes (Addendum)
  Echocardiogram 2D Echocardiogram with definity has been performed. Technically difficult exam due to patient in pain, in chair and body habitus. Patient asked that exam be ended due to difficulty breathing.   [name] L Androw 06/11/2016, 2:14 PM

## 2016-06-11 NOTE — Progress Notes (Signed)
Initial Nutrition Assessment  DOCUMENTATION CODES:   Morbid obesity  INTERVENTION:   Premier Protein TID, each supplement provides 160kcal and 30g protein.   Double protein portions with meals  MVI  NUTRITION DIAGNOSIS:   Increased nutrient needs related to catabolic illness, wound healing, other (see comment) (morbid obesity) as evidenced by increased estimated needs from protein.  GOAL:   Patient will meet greater than or equal to 90% of their needs  MONITOR:   PO intake, Supplement acceptance, Labs, Weight trends, Skin  REASON FOR ASSESSMENT:   Consult Assessment of nutrition requirement/status  ASSESSMENT:   51 y/o male with morbid obesity, diastolic CHF, COPD, HTN, HLD, DM2, obesity hypoventilation syndrome s/p trach presents to the ER with cc of leg swelling. Pt reports that over the past 3 days, his legs has had increased swelling and redness and pain.    Met with pt in room today. Pt reports good appetite today and pta. Pt reports that he lives in a facility and that the food is not good. (patient rates food 4/10) Pt currently eating 100% meals but reports that he is still hungry after meals. Pt with weight gain likely related to fluid changes as pt with edema and pulmonary congestion. Pt currently getting lasix. Pt with wound on sacrum. RD will order supplements to help pt meet estimated needs as pt with wounds, CHF, COPD, and morbid obesity.   Medications reviewed and include: aspirin, keflex, lovenox, lasix, insulin, metformin, KCl  Labs reviewed: Cl 94(L), C02 37(H) Wbc- 15.3(H) cbgs- 149, 172 x 24 hrs  Nutrition-Focused physical exam completed. Findings are no fat depletion, no muscle depletion, and moderate edema in BLE. Pt is morbidly obese.    Diet Order:  Diet Carb Modified Fluid consistency: Thin; Room service appropriate? Yes  Skin:  Wound (see comment) (unstageable pressure injury sacrum)  Last BM:  5/6  Height:   Ht Readings from Last 1  Encounters:  06/10/16 5' 7" (1.702 m)    Weight:   Wt Readings from Last 1 Encounters:  06/11/16 (!) 431 lb 12.8 oz (195.9 kg)    Ideal Body Weight:  67.2 kg  BMI:  Body mass index is 67.63 kg/m.  Estimated Nutritional Needs:   Kcal:  2700-3000kcal/day  Protein:  >150g/day  Fluid:  >2.7L/day   EDUCATION NEEDS:   Education needs no appropriate at this time   , RD, LDN Pager #- [phone]  

## 2016-06-11 NOTE — Progress Notes (Signed)
Pt is from SNF and will be followed there for CHF. Please have note in discharge summary.

## 2016-06-11 NOTE — Progress Notes (Signed)
PT will not allow RT to assess trach at this time. PT Sp02 83% on humidified RA- PT refuses to allow RT to place on 02- RN aware. PT states he will let staff know if he has any respiratory needs- RN aware. RT attempted to share information on heart and brain effects when Sp02 low- PT ask me to leave his room. RT asked RN to check bedside pulse ox alarms to ensure safety. PT also refuses to have emergency trach equipment at bedside- insist they are kept in cabinet above room sink. RN aware.

## 2016-06-11 NOTE — Progress Notes (Signed)
Patient has unstageable pressure ulcer to sacrum. States he is from [facility] assisted living. He states they "change his dressing (wet to dry) Monday, Wednesday, Friday." Upon asking if I could remove dressing to assess wound, he refused. Currently it is packed with gauze and covered with a dry dressing and tape.

## 2016-06-11 NOTE — Progress Notes (Signed)
Pt's O2 sat monitor will alarm due to  desating  high 70's to low 80's. Pt keep refusing oxygen and stated he's feeling ok just on humidified air and doesn't use oxygen at home. He said he will notify staff if he feels like having some respiratory distress and SOB.

## 2016-06-11 NOTE — Consult Note (Signed)
[facility] Nurse wound consult note Reason for Consult:Healing Stage 4 pressure injury Wound type:Pressure Pressure Injury POA: Yes Measurement:0.6cm x 2.5cm x 0.2cm Wound CKI:CHTV, moist Drainage (amount, consistency, odor) scant serous Periwound: with evidence of previous wound healing (scar tissue), also maceration from perspiration Dressing procedure/placement/frequency: We will today implement twice daily saline dressings and try to minimize over-saturation of gauze with saline.  Moisture in the subpannicular area will be managed with our house antimicrobial textile, InterDry Ag+.  Patient has a bariatric mattress with low air loss feature. [facility] nursing team will not follow, but will remain available to this patient, the nursing and medical teams.  Please re-consult if needed. Thanks, Maudie Flakes, MSN, RN, [place], Arther Abbott  Pager# [phone]

## 2016-06-11 NOTE — Clinical Social Work Note (Signed)
Clinical Social Work Assessment  Patient Details  Name: [name] MRN: [id number] Date of Birth: March 27, 1965  Date of referral:  06/11/16               Reason for consult:  Discharge Planning                Permission sought to share information with:  Chartered certified accountant granted to share information::  Yes, Verbal Permission Granted  Name::     [facility]  Agency::     Relationship::     Contact Information:     Housing/Transportation Living arrangements for the past 2 months:  [place] of Information:  Patient, Facility Patient Interpreter Needed:  None Criminal Activity/Legal Involvement Pertinent to Current Situation/Hospitalization:  No - Comment as needed Significant Relationships:  Church, [employer] Lives with:  Facility Resident Do you feel safe going back to the place where you live?  Yes Need for family participation in patient care:  No (Coment)  Care giving concerns:  Pt from [place]. Has lived there 7 weeks, prior to that lived independently at home. States he plans to return to [place] at DC, no issues there. Informs CSW he hopes to "eventually be able to live on my own again." No family involvement but reports close/supportive church family. At baseline ambulates independently (?with cane) and staff at [facility] assist him with bathing and preparing meals.  Pt initially reluctant to have CSW contact ALF, however CSW informed him that communication would be needed in order to facilitate dc. Pt agreed that needed information could be shared.  Alma Downs at [facility] was provided with CSW contact information and reports pt can return when ready.    Social Worker assessment / plan: CSW consulted due to pt from ALF. Pt plans to return to [facility] when ready for dc. Facility aware of plan.  Will need updated FL2 once ready for DC.  Plan: DC to [facility] when ready, CSW will follow  to assist.   Employment status:  Disabled (Comment on whether or not currently receiving Disability) Insurance information:  Medicare, Medicaid [place] PT Recommendations:  Not assessed at this time Information / Referral to community resources:     Patient/Family's Response to care:  Pt appreciative of care.   Patient/Family's Understanding of and Emotional Response to Diagnosis, Current Treatment, and Prognosis:  Pt demonstrates adequate understanding of plan. States he is glad to "already have a place to go back to."  Emotional Assessment Appearance:  Appears stated age Attitude/Demeanor/Rapport:   (pleasant) Affect (typically observed):  Calm Orientation:  Oriented to Self, Oriented to Place, Oriented to  Time, Oriented to Situation Alcohol / Substance use:  Not Applicable Psych involvement (Current and /or in the community):  No (Comment)  Discharge Needs  Concerns to be addressed:  No discharge needs identified Readmission within the last 30 days:  No Current discharge risk:  None Barriers to Discharge:  Continued Medical Work up   [employer], LCSW 06/11/2016, 12:30 PM  [phone]

## 2016-06-12 LAB — CBC
HEMATOCRIT: 40.9 % (ref 39.0–52.0)
Hemoglobin: 14.2 g/dL (ref 13.0–17.0)
MCH: 26.1 pg (ref 26.0–34.0)
MCHC: 34.7 g/dL (ref 30.0–36.0)
MCV: 75.2 fL — AB (ref 78.0–100.0)
Platelets: 270 10*3/uL (ref 150–400)
RBC: 5.44 MIL/uL (ref 4.22–5.81)
RDW: 16.1 % — ABNORMAL HIGH (ref 11.5–15.5)
WBC: 16.5 10*3/uL — AB (ref 4.0–10.5)

## 2016-06-12 LAB — CBC WITH DIFFERENTIAL/PLATELET
Basophils Absolute: 0 10*3/uL (ref 0.0–0.1)
Basophils Relative: 0 %
EOS PCT: 3 %
Eosinophils Absolute: 0.5 10*3/uL (ref 0.0–0.7)
HEMATOCRIT: 43.4 % (ref 39.0–52.0)
HEMOGLOBIN: 14.6 g/dL (ref 13.0–17.0)
Lymphocytes Relative: 26 %
Lymphs Abs: 4.1 10*3/uL — ABNORMAL HIGH (ref 0.7–4.0)
MCH: 25.7 pg — ABNORMAL LOW (ref 26.0–34.0)
MCHC: 33.6 g/dL (ref 30.0–36.0)
MCV: 76.3 fL — ABNORMAL LOW (ref 78.0–100.0)
MONOS PCT: 9 %
Monocytes Absolute: 1.4 10*3/uL — ABNORMAL HIGH (ref 0.1–1.0)
NEUTROS PCT: 62 %
Neutro Abs: 9.9 10*3/uL — ABNORMAL HIGH (ref 1.7–7.7)
Platelets: ADEQUATE 10*3/uL (ref 150–400)
RBC: 5.69 MIL/uL (ref 4.22–5.81)
RDW: 16.1 % — ABNORMAL HIGH (ref 11.5–15.5)
SMEAR REVIEW: ADEQUATE
WBC: 15.9 10*3/uL — ABNORMAL HIGH (ref 4.0–10.5)

## 2016-06-12 LAB — GLUCOSE, CAPILLARY
Glucose-Capillary: 188 mg/dL — ABNORMAL HIGH (ref 65–99)
Glucose-Capillary: 153 mg/dL — ABNORMAL HIGH (ref 65–99)
Glucose-Capillary: 152 mg/dL — ABNORMAL HIGH (ref 65–99)
Glucose-Capillary: 145 mg/dL — ABNORMAL HIGH (ref 65–99)

## 2016-06-12 LAB — BASIC METABOLIC PANEL
Anion gap: 8 (ref 5–15)
BUN: 18 mg/dL (ref 6–20)
CHLORIDE: 95 mmol/L — AB (ref 101–111)
CO2: 34 mmol/L — ABNORMAL HIGH (ref 22–32)
Calcium: 9.4 mg/dL (ref 8.9–10.3)
Creatinine, Ser: 0.77 mg/dL (ref 0.61–1.24)
GFR calc non Af Amer: >60 mL/min (ref >60)
Glucose, Bld: 149 mg/dL — ABNORMAL HIGH (ref 65–99)
POTASSIUM: 4.3 mmol/L (ref 3.5–5.1)
SODIUM: 137 mmol/L (ref 135–145)

## 2016-06-12 LAB — HEMOGLOBIN A1C
HEMOGLOBIN A1C: 8.1 % — AB (ref 4.8–5.6)
Mean Plasma Glucose: 186 mg/dL

## 2016-06-12 MED ORDER — IPRATROPIUM-ALBUTEROL 0.5-2.5 (3) MG/3ML IN SOLN
3.0000 mL | Freq: Four times a day (QID) | RESPIRATORY_TRACT | Status: DC | PRN
  Administered 2016-06-12 – 2016-06-20 (×7): 3 mL via RESPIRATORY_TRACT
  Filled 2016-06-12 (×8): qty 3

## 2016-06-12 MED ORDER — IPRATROPIUM-ALBUTEROL 0.5-2.5 (3) MG/3ML IN SOLN: 3.0000 mL | Freq: Four times a day (QID) | RESPIRATORY_TRACT | Status: DC

## 2016-06-12 MED ORDER — IPRATROPIUM-ALBUTEROL 0.5-2.5 (3) MG/3ML IN SOLN
RESPIRATORY_TRACT | Status: AC
  Filled 2016-06-12: qty 3

## 2016-06-12 MED ORDER — HYDROCODONE-ACETAMINOPHEN 5-325 MG PO TABS
2.0000 | ORAL_TABLET | Freq: Once | ORAL | Status: AC
  Administered 2016-06-12: 2 via ORAL
  Filled 2016-06-12: qty 2

## 2016-06-12 MED ORDER — ENOXAPARIN SODIUM 60 MG/0.6ML ~~LOC~~ SOLN
50.0000 mg | Freq: Two times a day (BID) | SUBCUTANEOUS | Status: DC
  Administered 2016-06-12 – 2016-06-16 (×9): 50 mg via SUBCUTANEOUS
  Filled 2016-06-12 (×9): qty 0.6

## 2016-06-12 NOTE — Progress Notes (Signed)
PROGRESS NOTE [facility]   [name]   AST:[id number] DOB: 09-May-1965  DOA: 06/10/2016 PCP: Boykin Nearing, MD   Brief Narrative:  50  hypertension,  morbidity obese Body mass index is 69.37 kg/m. diabetes mellitus IgA monoclonal gammopathy Angioedema to tramadol Prior cocaine and marijuana Chr resp failure with Lurline Idol since 2012 questionable history of congestive heart failure.   Presented to the emergency department 5/7   progressive shortness of breath for the past 5 days PTA associated symptoms include decreased urinary frequency and some chest discomfort.  Patient also have history of COPD but denies wheezing or coughing at the time.  Patient also report increasing swelling in his lower extremity and mild redness in both extremities that started about 2 days PTA.  Patient admitted with concerns of CHF exacerbation and ? Cellulitis.  Subjective:  awake alert Able to talk despite no passey-muir No cp no n Still feels very swollen Indicates LLE is painful No other real issues-becomes emotional on discussion of his medical illnesses  Interested in wearing stockings Nursing reports some bright red per rectum after wiping   Assessment & Plan: Acute respiratory failure due to ? acute congestive heart failure- Clinically presenting with symptoms of congestive heart failure shortness of breath, chest x-ray with pulmonary congestion and lower extremity edema. BNP unaccurate due to morbid obesity. High salt diet and heavy water drinker ~ 6L a day  Lasix 40 mg IV twice a day TNIs negative EKG independently reviewed and interpreted as follow NSR with QTc prolongation   ECHO 60-65%-no criterion for Diastolic or sys hf Low-salt diet, strict I&O's, daily weights., -1.8iter so far BP control Atelonol switched to Coreg, titrate as tolerated  Wean O2 as tolerated keep O2 sat > 91%  Lower extremity erythema Sacral decubitus without active infection,  Mild elevated  WBC-has never been normal Low likelihood of LE for cellulitis  D/c Keflex 5/9 Lower extremity Doppler, negative for DVT Sacral decubitus to be cleansed as per [place] nurse input  Type 2 diabetes mellitus - CBG's stable  Continue metformin SSI and monitor CBGs Ranges 149-188  Essential hypertension - BP slight above goal  Tenormin was changed to coreg in view of possible systolic abnormalities. Adjust Coreg as tolerated  Monitor BP  Possible hemorrhoidal bleed -Anatomy is very difficult to determine given his sacral decubitus but I do not see any overt ulcer or hemorrhoid Not a very good candidate for colonoscopy Has had 2 episodes of blood in stool   repeat CBC today. ? Add Anusol suppositories. If does not resolve will need to consult GI  COPD - doesn't seem to be on acute exacerbation OHS S/OSA unclear if on BiPAP at night Acute hypoxic respiratory failure as above Patient is trach, without oxygen dependence We'll keep DuoNeb's when necessary Wean O2 as tolerated Trach care per nursing protocol  He tells me he has a headache with oxygen and refuses to use it--both respiratory and myself discussed this with him in detail  Healing stage 4 sacral ulcer - POA Wound care recommendations appreciated   Morbid obesity Dietitian consult  DVT prophylaxis: Lovenox Code Status: Full code Family Communication: None at bedside Disposition Plan: Anticipate discharge to previous home environment.    Consultants:   None   Procedures:   ECHO 06/11/16  Antimicrobials: Antibiotics Given (last 72 hours)    Date/Time Action Medication Dose   06/10/16 2346 Given   cephALEXin (KEFLEX) capsule 500 mg 500 mg   06/11/16 0824 Given  cephALEXin (KEFLEX) capsule 500 mg 500 mg   06/11/16 2222 Given   cephALEXin (KEFLEX) capsule 500 mg 500 mg       Objective: Vitals:   06/12/16 0432 06/12/16 0511 06/12/16 0517 06/12/16 0811  BP:  132/67  (!) 143/78  Pulse: 95 81  78  Resp: 20 20     Temp:  98.2 F (36.8 C)    TempSrc:  Oral    SpO2: (!) 87% 90%    Weight:   (!) 200.9 kg (442 lb 14.4 oz)   Height:        Intake/Output Summary (Last 24 hours) at 06/12/16 0847 Last data filed at 06/12/16 0600  Gross per 24 hour  Intake             1440 ml  Output             1350 ml  Net               90 ml   Filed Weights   06/10/16 1252 06/11/16 0659 06/12/16 0517  Weight: (!) 183.7 kg (405 lb) (!) 195.9 kg (431 lb 12.8 oz) (!) 200.9 kg (442 lb 14.4 oz)    Examination:Alert obese pleasant oriented no distress has trach collar at 21% FiO2 no JVD. S1-S2 no murmur rub or gallop, thick neck, moderate dentition. Chest is clear to exam abdomen is morbidly obese lower extremities are swollen with tense pitting edema    Data Reviewed: I have personally reviewed following labs and imaging studies  CBC:  Recent Labs Lab 06/10/16 1307 06/12/16 0504  WBC 15.3* 15.9*  NEUTROABS 10.9* 9.9*  HGB 13.8 14.6  HCT 39.9 43.4  MCV 75.7* 76.3*  PLT 264 PLATELET CLUMPS NOTED ON SMEAR, COUNT APPEARS ADEQUATE   Basic Metabolic Panel:  Recent Labs Lab 06/10/16 1307 06/11/16 0519 06/12/16 0504  NA 138 137 137  K 4.5 4.3 4.3  CL 98* 94* 95*  CO2 33* 37* 34*  GLUCOSE 149* 172* 149*  BUN 12 14 18   CREATININE 0.79 0.80 0.77  CALCIUM 9.1 9.4 9.4   GFR: Estimated Creatinine Clearance: 187.5 mL/min (by C-G formula based on SCr of 0.77 mg/dL). Liver Function Tests:  Recent Labs Lab 06/10/16 1307  AST 18  ALT 23  ALKPHOS 76  BILITOT 1.0  PROT 7.7  ALBUMIN 3.7   No results for input(s): LIPASE, AMYLASE in the last 168 hours. No results for input(s): AMMONIA in the last 168 hours. Coagulation Profile: No results for input(s): INR, PROTIME in the last 168 hours. Cardiac Enzymes:  Recent Labs Lab 06/10/16 2320 06/11/16 0519 06/11/16 1125  TROPONINI <0.03 <0.03 <0.03   BNP (last 3 results) No results for input(s): PROBNP in the last 8760 hours. HbA1C:  Recent Labs   06/10/16 2320  HGBA1C 8.1*   CBG:  Recent Labs Lab 06/11/16 0737 06/11/16 1144 06/11/16 1720 06/11/16 2136 06/12/16 0743  GLUCAP 145* 164* 137* 152* 188*   Lipid Profile: No results for input(s): CHOL, HDL, LDLCALC, TRIG, CHOLHDL, LDLDIRECT in the last 72 hours. Thyroid Function Tests: No results for input(s): TSH, T4TOTAL, FREET4, T3FREE, THYROIDAB in the last 72 hours. Anemia Panel: No results for input(s): VITAMINB12, FOLATE, FERRITIN, TIBC, IRON, RETICCTPCT in the last 72 hours. Sepsis Labs:  Recent Labs Lab 06/10/16 1317 06/10/16 1617  LATICACIDVEN 0.88 0.41*    Recent Results (from the past 240 hour(s))  MRSA PCR Screening     Status: None   Collection Time: 06/11/16  1:47  AM  Result Value Ref Range Status   MRSA by PCR NEGATIVE NEGATIVE Final    Comment:        The GeneXpert MRSA Assay (FDA approved for NASAL specimens only), is one component of a comprehensive MRSA colonization surveillance program. It is not intended to diagnose MRSA infection nor to guide or monitor treatment for MRSA infections.      Radiology Studies: Dg Chest Port 1 View  Result Date: 06/10/2016 CLINICAL DATA:  Hypoxia, dyspnea EXAM: PORTABLE CHEST 1 VIEW COMPARISON:  01/26/2015 chest radiograph. FINDINGS: Tracheostomy tube tip overlies the tracheal air column just below the thoracic inlet. Stable cardiomediastinal silhouette with mild cardiomegaly. No pneumothorax. No pleural effusion. Mild-to-moderate pulmonary edema. IMPRESSION: Mild-to-moderate congestive heart failure. Electronically Signed   By: Ilona Sorrel M.D.   On: 06/10/2016 15:27    Scheduled Meds: . aspirin EC  81 mg Oral Daily  . atorvastatin  20 mg Oral Daily  . carvedilol  6.25 mg Oral BID WC  . cephALEXin  500 mg Oral Q12H  . enoxaparin (LOVENOX) injection  40 mg Subcutaneous Q24H  . furosemide  40 mg Intravenous BID  . insulin aspart  0-20 Units Subcutaneous TID WC  . metFORMIN  1,000 mg Oral BID WC  .  multivitamin with minerals  1 tablet Oral Daily  . potassium chloride SA  20 mEq Oral Daily  . protein supplement shake  11 oz Oral TID BM  . sodium chloride flush  3 mL Intravenous Q12H   Continuous Infusions: . sodium chloride       LOS: 1 day     Nita Sells, MD Pager: Text Page via [email or web address]  [phone]  If 7PM-7AM, please contact night-coverage [email or web address] Password [facility] 06/12/2016, 8:47 AM

## 2016-06-12 NOTE — Progress Notes (Signed)
Pt has had 2 bowel movements this shift with some blood in the stool. Lamar Blinks NP was notified.

## 2016-06-12 NOTE — Progress Notes (Signed)
RT spoke with patient about O2. Patient has been refusing to go on O2 because he feels that it gives him a headache. O2 sats are 88% at this time. RN aware.

## 2016-06-12 NOTE — Progress Notes (Signed)
Patient has wound to sacrum with dressing change orders for each shift. Upon explaining to patient, he refused dressing change for this shift. Will continue to monitor.

## 2016-06-12 NOTE — Consult Note (Signed)
[place] Nurse wound follow up Asked to recommend skin cleanser for buttock and perineal area.  Orders provided for Nursing to use house skin cleanser, Bedside Care-a pH balanced, no rinse cleanser. [place] nursing team will not follow, but will remain available to this patient, the nursing and medical teams.  Please re-consult if needed. Thanks, Maudie Flakes, MSN, RN, [place], Arther Abbott  Pager# [phone]

## 2016-06-12 NOTE — Progress Notes (Signed)
Pt AIRWAY supplies OVER SINK per Pt request.  Pt aware of low SpO2 but states that using oxygen gives him a headache and thus continues to refuse oxygen.  Pt is alert and oriented to person, place, and time.  Pt encouraged to contact RT if he needs anything.

## 2016-06-12 NOTE — Progress Notes (Signed)
Made MD notified of patients medium sized BM with moderate to large amount of bright red blood. STAT CBC ordered.

## 2016-06-13 LAB — BASIC METABOLIC PANEL
ANION GAP: 8 (ref 5–15)
BUN: 20 mg/dL (ref 6–20)
CALCIUM: 9 mg/dL (ref 8.9–10.3)
CO2: 35 mmol/L — AB (ref 22–32)
CREATININE: 0.91 mg/dL (ref 0.61–1.24)
Chloride: 95 mmol/L — ABNORMAL LOW (ref 101–111)
Glucose, Bld: 195 mg/dL — ABNORMAL HIGH (ref 65–99)
Potassium: 3.9 mmol/L (ref 3.5–5.1)
SODIUM: 138 mmol/L (ref 135–145)

## 2016-06-13 LAB — GLUCOSE, CAPILLARY
GLUCOSE-CAPILLARY: 164 mg/dL — AB (ref 65–99)
Glucose-Capillary: 168 mg/dL — ABNORMAL HIGH (ref 65–99)
Glucose-Capillary: 117 mg/dL — ABNORMAL HIGH (ref 65–99)
Glucose-Capillary: 169 mg/dL — ABNORMAL HIGH (ref 65–99)

## 2016-06-13 MED ORDER — FUROSEMIDE 10 MG/ML IJ SOLN
60.0000 mg | Freq: Two times a day (BID) | INTRAMUSCULAR | Status: DC
  Administered 2016-06-13: 60 mg via INTRAVENOUS
  Filled 2016-06-13: qty 6

## 2016-06-13 NOTE — Progress Notes (Signed)
Pt. became upset with this RT after the last of two prn nebulizer aerosol txs. that were given to him as requested once he became aware they have both had been given with Oxygen instead of room air. The humidified aerosol trach collar has been being run off of the only air flowmeter due to pt. complaining that the Oxygen has been giving him a headache. The aerosol tx.'s take ~8 minutes to finish, initial complaint was only made after the last prn aerosol treatment was given this morning after pt. was finished ordering breakfast, RN was made aware, along with apologies made to pt. while staff member was attending pt. @ bedside with corrective action made of assuring pt. that this RT will only give aerosol tx.'s with room air and will pass along information to following RT, which has been done in report.

## 2016-06-13 NOTE — Progress Notes (Signed)
PROGRESS NOTE [facility]   [name]   XLK:[id number] DOB: [birth year]  DOA: 06/10/2016 PCP: Boykin Nearing, MD   Brief Narrative:  50  hypertension,  morbidity obese Body mass index is 66.77 kg/m. diabetes mellitus IgA monoclonal gammopathy Angioedema to tramadol Prior cocaine and marijuana Chr resp failure with Lurline Idol since 2012 questionable history of congestive heart failure.   Presented to the emergency department 5/7   progressive shortness of breath for the past 5 days PTA associated symptoms include decreased urinary frequency and some chest discomfort.  Patient also have history of COPD but denies wheezing or coughing at the time.  Patient also report increasing swelling in his lower extremity and mild redness in both extremities that started about 2 days PTA.  Patient admitted with concerns of CHF exacerbation and ? Cellulitis.  Subjective:  Well upset about oxygen used with Nebs--States headache Overall some LE pain but less swollen eating Ambulatory no fever no diarr  Assessment & Plan: Acute respiratory failure due to ? acute congestive heart failure- Clinically presenting with symptoms of congestive heart failure shortness of breath, chest x-ray with pulmonary congestion and lower extremity edema. BNP unaccurate due to morbid obesity. High salt diet and heavy water drinker ~ 6L a day  Lasix 40 mg IV twice a day-->60 IV bid for more brisk diuresis TNIs negative Unclear weigth 440--426 in 1 day EKG independently reviewed and interpreted as follow NSR with QTc prolongation   ECHO 60-65%-no criterion for Diastolic or sys hf Low-salt diet, strict I&O's, daily weights., -1.9iter so far BP control Atelonol switched to Coreg, titrate as tolerated  Wean O2 as tolerated keep O2 sat > 91%  Lower extremity erythema Sacral decubitus without active infection,  Mild elevated WBC-has never been normal Low likelihood of LE for cellulitis  D/c Keflex 5/9 Lower  extremity Doppler, negative for DVT Sacral decubitus to be cleansed as per [place] nurse input  Type 2 diabetes mellitus - CBG's stable  Continue metformin SSI and monitor CBGs Ranges 165-196  Essential hypertension - BP slight above goal  Tenormin was changed to coreg in view of possible systolic abnormalities. Adjust Coreg as tolerated  Monitor BP  Possible hemorrhoidal bleed -Anatomy is very difficult to determine given his sacral decubitus but I do not see any overt ulcer or hemorrhoid Not a very good candidate for colonoscopy Has had 2 episodes of blood in stool   repeat CBC today. ? Add Anusol suppositories. No further large stool-follow periodically  COPD - doesn't seem to be on acute exacerbation OHS S/OSA unclear if on BiPAP at night Acute hypoxic respiratory failure as above Patient is trach, without oxygen dependence We'll keep DuoNeb's when necessary Wean O2 as tolerated Trach care per nursing protocol  He tells me he has a headache with oxygen and refuses to use it--both respiratory and myself discussed this with him in detail  Healing stage 4 sacral ulcer - POA Wound care recommendations appreciated   Morbid obesity Dietitian consult  DVT prophylaxis: Lovenox Code Status: Full code Family Communication: None at bedside Disposition Plan: Anticipate discharge to previous home environment.    Consultants:   None   Procedures:   ECHO 06/11/16  Antimicrobials: Antibiotics Given (last 72 hours)    Date/Time Action Medication Dose   06/10/16 2346 Given   cephALEXin (KEFLEX) capsule 500 mg 500 mg   06/11/16 0824 Given   cephALEXin (KEFLEX) capsule 500 mg 500 mg   06/11/16 2222 Given   cephALEXin (KEFLEX) capsule  500 mg 500 mg       Objective: Vitals:   06/13/16 0505 06/13/16 0619 06/13/16 0638 06/13/16 0852  BP: 127/74     Pulse: 71   75  Resp: 18   20  Temp: 97.9 F (36.6 C)     TempSrc: Oral     SpO2:  91% 90% (!) 89%  Weight: (!) 193.4 kg  (426 lb 4.8 oz)     Height:        Intake/Output Summary (Last 24 hours) at 06/13/16 1118 Last data filed at 06/13/16 0940  Gross per 24 hour  Intake              960 ml  Output             1050 ml  Net              -90 ml   Filed Weights   06/12/16 0517 06/12/16 1751 06/13/16 0505  Weight: (!) 200.9 kg (442 lb 14.4 oz) (!) 193.7 kg (427 lb) (!) 193.4 kg (426 lb 4.8 oz)    Examination:Alert obese pleasant oriented Morbid obesity, Body mass index is 66.77 kg/m.  trach collar at 21% FiO2  no JVD. S1-S2 no murmur rub or gallop, thick neck Tele shows sinus and sinus tach  Chest is clear to exam abdomen is morbidly obese lower extremities are swollen with tense pitting edema    Data Reviewed: I have personally reviewed following labs and imaging studies  CBC:  Recent Labs Lab 06/10/16 1307 06/12/16 0504 06/12/16 1255  WBC 15.3* 15.9* 16.5*  NEUTROABS 10.9* 9.9*  --   HGB 13.8 14.6 14.2  HCT 39.9 43.4 40.9  MCV 75.7* 76.3* 75.2*  PLT 264 PLATELET CLUMPS NOTED ON SMEAR, COUNT APPEARS ADEQUATE 976   Basic Metabolic Panel:  Recent Labs Lab 06/10/16 1307 06/11/16 0519 06/12/16 0504 06/13/16 0517  NA 138 137 137 138  K 4.5 4.3 4.3 3.9  CL 98* 94* 95* 95*  CO2 33* 37* 34* 35*  GLUCOSE 149* 172* 149* 195*  BUN 12 14 18 20   CREATININE 0.79 0.80 0.77 0.91  CALCIUM 9.1 9.4 9.4 9.0   GFR: Estimated Creatinine Clearance: 160.7 mL/min (by C-G formula based on SCr of 0.91 mg/dL). Liver Function Tests:  Recent Labs Lab 06/10/16 1307  AST 18  ALT 23  ALKPHOS 76  BILITOT 1.0  PROT 7.7  ALBUMIN 3.7   No results for input(s): LIPASE, AMYLASE in the last 168 hours. No results for input(s): AMMONIA in the last 168 hours. Coagulation Profile: No results for input(s): INR, PROTIME in the last 168 hours. Cardiac Enzymes:  Recent Labs Lab 06/10/16 2320 06/11/16 0519 06/11/16 1125  TROPONINI <0.03 <0.03 <0.03   BNP (last 3 results) No results for input(s): PROBNP  in the last 8760 hours. HbA1C:  Recent Labs  06/10/16 2320  HGBA1C 8.1*   CBG:  Recent Labs Lab 06/12/16 0743 06/12/16 1119 06/12/16 1644 06/12/16 2059 06/13/16 0753  GLUCAP 188* 153* 152* 145* 168*   Lipid Profile: No results for input(s): CHOL, HDL, LDLCALC, TRIG, CHOLHDL, LDLDIRECT in the last 72 hours. Thyroid Function Tests: No results for input(s): TSH, T4TOTAL, FREET4, T3FREE, THYROIDAB in the last 72 hours. Anemia Panel: No results for input(s): VITAMINB12, FOLATE, FERRITIN, TIBC, IRON, RETICCTPCT in the last 72 hours. Sepsis Labs:  Recent Labs Lab 06/10/16 1317 06/10/16 1617  LATICACIDVEN 0.88 0.41*    Recent Results (from the past 240 hour(s))  MRSA PCR  Screening     Status: None   Collection Time: 06/11/16  1:47 AM  Result Value Ref Range Status   MRSA by PCR NEGATIVE NEGATIVE Final    Comment:        The GeneXpert MRSA Assay (FDA approved for NASAL specimens only), is one component of a comprehensive MRSA colonization surveillance program. It is not intended to diagnose MRSA infection nor to guide or monitor treatment for MRSA infections.      Radiology Studies: No results found.  Scheduled Meds: . aspirin EC  81 mg Oral Daily  . atorvastatin  20 mg Oral Daily  . carvedilol  6.25 mg Oral BID WC  . enoxaparin (LOVENOX) injection  50 mg Subcutaneous Q12H  . furosemide  60 mg Intravenous BID  . insulin aspart  0-20 Units Subcutaneous TID WC  . ipratropium-albuterol      . metFORMIN  1,000 mg Oral BID WC  . multivitamin with minerals  1 tablet Oral Daily  . potassium chloride SA  20 mEq Oral Daily  . protein supplement shake  11 oz Oral TID BM  . sodium chloride flush  3 mL Intravenous Q12H   Continuous Infusions: . sodium chloride       LOS: 2 days     Nita Sells, MD Pager: Text Page via [email or web address]  [phone]  If 7PM-7AM, please contact night-coverage [email or web address] Password [email or web address] 06/13/2016, 11:18 AM

## 2016-06-14 LAB — CBC WITH DIFFERENTIAL/PLATELET
BASOS ABS: 0 10*3/uL (ref 0.0–0.1)
Basophils Relative: 0 %
EOS ABS: 0.5 10*3/uL (ref 0.0–0.7)
Eosinophils Relative: 3 %
HEMATOCRIT: 41.8 % (ref 39.0–52.0)
HEMOGLOBIN: 14.1 g/dL (ref 13.0–17.0)
LYMPHS PCT: 27 %
Lymphs Abs: 4.7 10*3/uL — ABNORMAL HIGH (ref 0.7–4.0)
MCH: 25.7 pg — ABNORMAL LOW (ref 26.0–34.0)
MCHC: 33.7 g/dL (ref 30.0–36.0)
MCV: 76.3 fL — ABNORMAL LOW (ref 78.0–100.0)
MONOS PCT: 9 %
Monocytes Absolute: 1.6 10*3/uL — ABNORMAL HIGH (ref 0.1–1.0)
NEUTROS ABS: 10.6 10*3/uL — AB (ref 1.7–7.7)
NEUTROS PCT: 61 %
Platelets: 299 10*3/uL (ref 150–400)
RBC: 5.48 MIL/uL (ref 4.22–5.81)
RDW: 15.8 % — ABNORMAL HIGH (ref 11.5–15.5)
WBC: 17.4 10*3/uL — AB (ref 4.0–10.5)

## 2016-06-14 LAB — BASIC METABOLIC PANEL
ANION GAP: 9 (ref 5–15)
BUN: 23 mg/dL — ABNORMAL HIGH (ref 6–20)
CALCIUM: 9.4 mg/dL (ref 8.9–10.3)
CO2: 36 mmol/L — ABNORMAL HIGH (ref 22–32)
Chloride: 92 mmol/L — ABNORMAL LOW (ref 101–111)
Creatinine, Ser: 0.86 mg/dL (ref 0.61–1.24)
GFR calc Af Amer: >60 mL/min (ref >60)
Glucose, Bld: 136 mg/dL — ABNORMAL HIGH (ref 65–99)
POTASSIUM: 3.9 mmol/L (ref 3.5–5.1)
Sodium: 137 mmol/L (ref 135–145)

## 2016-06-14 LAB — GLUCOSE, CAPILLARY
GLUCOSE-CAPILLARY: 130 mg/dL — AB (ref 65–99)
Glucose-Capillary: 197 mg/dL — ABNORMAL HIGH (ref 65–99)
Glucose-Capillary: 139 mg/dL — ABNORMAL HIGH (ref 65–99)
Glucose-Capillary: 119 mg/dL — ABNORMAL HIGH (ref 65–99)

## 2016-06-14 MED ORDER — FUROSEMIDE 10 MG/ML IJ SOLN
60.0000 mg | Freq: Three times a day (TID) | INTRAMUSCULAR | Status: DC
  Administered 2016-06-14 – 2016-06-16 (×7): 60 mg via INTRAVENOUS
  Filled 2016-06-14 (×7): qty 6

## 2016-06-14 NOTE — Progress Notes (Signed)
CSW following for disposition/ DC planning as pt is from ALF. Resides at [facility]F and plans to return at DC. Met with pt at bedside, confirmed plan remains. Pt states he will need assistance with transportation upon DC, CSW assured him options will be explored. Pt agreeable.  Will continue following and assist with DC planning.  Sharren Bridge, MSW, LCSW Clinical Social Work 06/14/2016 [phone]

## 2016-06-14 NOTE — Progress Notes (Signed)
Assumed care of pt at 1745. Pt is A/Ox3, trach in place and intact, CBG=139 gave 3 units insulin per orders.  Pt denied any other needs at this time.   Hourly rounding performed.

## 2016-06-14 NOTE — Progress Notes (Signed)
Pt. seen after shift change/report for first of scheduled Q4 trach checks, refused this RT to assess breath sounds, pulse oximetry for saturation check, aerosol humidity bottle water level, trache suctioning, trach inner cannula assessment/change, made pt. aware to notify staff if RT needed, stated,"staff can check things along with vital sings.

## 2016-06-14 NOTE — Progress Notes (Signed)
PROGRESS NOTE [facility]   [name]   TIR:[id number] DOB: 11-Oct-1965  DOA: 06/10/2016 PCP: Boykin Nearing, MD   Brief Narrative:  50  hypertension,  morbidity obese Body mass index is 66.61 kg/m. diabetes mellitus IgA monoclonal gammopathy Angioedema to tramadol Prior cocaine and marijuana Chr resp failure with Lurline Idol since 2012 questionable history of congestive heart failure.   Presented to the emergency department 5/7   progressive shortness of breath for the past 5 days PTA associated symptoms include decreased urinary frequency and some chest discomfort.  Patient also have history of COPD but denies wheezing or coughing at the time.  Patient also report increasing swelling in his lower extremity and mild redness in both extremities that started about 2 days PTA.  Patient admitted with concerns of CHF exacerbation and ? Cellulitis.  Subjective:  Fair Seems to have some flashbacks about his intubations otherwsie well Requests we increasd ehis lasix Legs feel fair   Assessment & Plan: Acute respiratory failure due to ? acute congestive heart failure- Clinically presenting with symptoms of congestive heart failure shortness of breath, chest x-ray with pulmonary congestion and lower extremity edema. BNP unaccurate due to morbid obesity. High salt diet and heavy water drinker ~ 6L a day and has followed recommendations to cut back to ~ 2 liters Lasix 40 mg IV twice a day-->60 IV bid-->tid 06/14/16 for more brisk diuresis TNIs negative Unclear weigth 440--426-->425 ECHO 60-65%-no criterion for Diastolic or sys hf Low-salt diet, strict I&O's, daily weights., -5.3 liter so far BP control Atelonol switched to Coreg, titrate as tolerated  Wean O2 as tolerated keep O2 sat > 91%  Lower extremity erythema Sacral decubitus without active infection,  Mild elevated WBC-has never been normal Low likelihood of LE for cellulitis  D/c Keflex 5/9 Lower extremity  Doppler, negative for DVT Sacral decubitus to be cleansed as per [facility] nurse input  Type 2 diabetes mellitus - CBG's stable  Continue metformin SSI and monitor CBGs Ranges 165-196  Essential hypertension - BP slight above goal  Tenormin was changed to coreg in view of possible systolic abnormalities. Adjust Coreg as tolerated  Monitor BP  Possible hemorrhoidal bleed -Anatomy is very difficult to determine given his sacral decubitus but I do not see any overt ulcer or hemorrhoid Not a very good candidate for colonoscopy Has had 2 episodes of blood in stool   repeat CBC today. ? Add Anusol suppositories. No further large stool-follow periodically--hemoglobin 14 range  COPD - doesn't seem to be on acute exacerbation OHS S/OSA unclear if on BiPAP at night Acute hypoxic respiratory failure as above Patient is trach, without oxygen dependence We'll keep DuoNeb's when necessary Wean O2 as tolerated Trach care per nursing protocol  He tells me he has a headache with oxygen and refuses to use it--both respiratory and myself discussed this with him in detail  Healing stage 4 sacral ulcer - POA Wound care recommendations appreciated   Morbid obesity Dietitian consult  DVT prophylaxis: Lovenox Code Status: Full code Family Communication: None at bedside Disposition Plan: Anticipate discharge to [facility] in ~ 5-7 days when diuresed adequately   Consultants:   None   Procedures:   ECHO 06/11/16  Antimicrobials: Antibiotics Given (last 72 hours)    Date/Time Action Medication Dose   06/11/16 2222 Given   cephALEXin (KEFLEX) capsule 500 mg 500 mg       Objective: Vitals:   06/13/16 2037 06/14/16 0635 06/14/16 0825 06/14/16 1455  BP: 102/66  126/73  140/84  Pulse: 88 85 89 82  Resp: (!) 22 17 18 18   Temp: 98.5 F (36.9 C) 98.3 F (36.8 C)  98.5 F (36.9 C)  TempSrc: Oral Oral  Oral  SpO2: 91% (!) 89% (!) 89% 94%  Weight:  (!) 192.9 kg (425 lb 4.8 oz)    Height:         Intake/Output Summary (Last 24 hours) at 06/14/16 1500 Last data filed at 06/14/16 1456  Gross per 24 hour  Intake              836 ml  Output             3675 ml  Net            -2839 ml   Filed Weights   06/12/16 1751 06/13/16 0505 06/14/16 0635  Weight: (!) 193.7 kg (427 lb) (!) 193.4 kg (426 lb 4.8 oz) (!) 192.9 kg (425 lb 4.8 oz)    Examination: Dozing in chair-esily rousable pleasant oriented , Body mass index is 66.61 kg/m.  trach collar at 21% FiO2 -- no JVD. S1-S2 no murmur rub or gallop, thick neck Tele shows sinus and sinus tach  Chest is clear to exam abdomen is morbidly obese lower extremities are swollen with tense pitting edema --this seems reduced from prior   Data Reviewed: I have personally reviewed following labs and imaging studies  CBC:  Recent Labs Lab 06/10/16 1307 06/12/16 0504 06/12/16 1255 06/14/16 0520  WBC 15.3* 15.9* 16.5* 17.4*  NEUTROABS 10.9* 9.9*  --  10.6*  HGB 13.8 14.6 14.2 14.1  HCT 39.9 43.4 40.9 41.8  MCV 75.7* 76.3* 75.2* 76.3*  PLT 264 PLATELET CLUMPS NOTED ON SMEAR, COUNT APPEARS ADEQUATE 270 169   Basic Metabolic Panel:  Recent Labs Lab 06/10/16 1307 06/11/16 0519 06/12/16 0504 06/13/16 0517 06/14/16 0520  NA 138 137 137 138 137  K 4.5 4.3 4.3 3.9 3.9  CL 98* 94* 95* 95* 92*  CO2 33* 37* 34* 35* 36*  GLUCOSE 149* 172* 149* 195* 136*  BUN 12 14 18 20  23*  CREATININE 0.79 0.80 0.77 0.91 0.86  CALCIUM 9.1 9.4 9.4 9.0 9.4   GFR: Estimated Creatinine Clearance: 169.8 mL/min (by C-G formula based on SCr of 0.86 mg/dL). Liver Function Tests:  Recent Labs Lab 06/10/16 1307  AST 18  ALT 23  ALKPHOS 76  BILITOT 1.0  PROT 7.7  ALBUMIN 3.7   No results for input(s): LIPASE, AMYLASE in the last 168 hours. No results for input(s): AMMONIA in the last 168 hours. Coagulation Profile: No results for input(s): INR, PROTIME in the last 168 hours. Cardiac Enzymes:  Recent Labs Lab 06/10/16 2320  06/11/16 0519 06/11/16 1125  TROPONINI <0.03 <0.03 <0.03   BNP (last 3 results) No results for input(s): PROBNP in the last 8760 hours. HbA1C: No results for input(s): HGBA1C in the last 72 hours. CBG:  Recent Labs Lab 06/13/16 1154 06/13/16 1641 06/13/16 2038 06/14/16 0734 06/14/16 1225  GLUCAP 164* 117* 169* 130* 197*   Lipid Profile: No results for input(s): CHOL, HDL, LDLCALC, TRIG, CHOLHDL, LDLDIRECT in the last 72 hours. Thyroid Function Tests: No results for input(s): TSH, T4TOTAL, FREET4, T3FREE, THYROIDAB in the last 72 hours. Anemia Panel: No results for input(s): VITAMINB12, FOLATE, FERRITIN, TIBC, IRON, RETICCTPCT in the last 72 hours. Sepsis Labs:  Recent Labs Lab 06/10/16 1317 06/10/16 1617  LATICACIDVEN 0.88 0.41*    Recent Results (from the past 240  hour(s))  MRSA PCR Screening     Status: None   Collection Time: 06/11/16  1:47 AM  Result Value Ref Range Status   MRSA by PCR NEGATIVE NEGATIVE Final    Comment:        The GeneXpert MRSA Assay (FDA approved for NASAL specimens only), is one component of a comprehensive MRSA colonization surveillance program. It is not intended to diagnose MRSA infection nor to guide or monitor treatment for MRSA infections.      Radiology Studies: No results found.  Scheduled Meds: . aspirin EC  81 mg Oral Daily  . atorvastatin  20 mg Oral Daily  . carvedilol  6.25 mg Oral BID WC  . enoxaparin (LOVENOX) injection  50 mg Subcutaneous Q12H  . furosemide  60 mg Intravenous TID  . insulin aspart  0-20 Units Subcutaneous TID WC  . metFORMIN  1,000 mg Oral BID WC  . multivitamin with minerals  1 tablet Oral Daily  . potassium chloride SA  20 mEq Oral Daily  . protein supplement shake  11 oz Oral TID BM  . sodium chloride flush  3 mL Intravenous Q12H   Continuous Infusions: . sodium chloride       LOS: 3 days   Verneita Griffes, MD [facility] ([phone]  If 7PM-7AM, please contact  night-coverage [email or web address] Password [email or web address] 06/14/2016, 3:00 PM

## 2016-06-15 ENCOUNTER — Inpatient Hospital Stay (HOSPITAL_COMMUNITY): Payer: Medicare Other

## 2016-06-15 DIAGNOSIS — L8990 Pressure ulcer of unspecified site, unspecified stage: Secondary | ICD-10-CM | POA: Insufficient documentation

## 2016-06-15 LAB — GLUCOSE, CAPILLARY
GLUCOSE-CAPILLARY: 124 mg/dL — AB (ref 65–99)
Glucose-Capillary: 254 mg/dL — ABNORMAL HIGH (ref 65–99)
Glucose-Capillary: 114 mg/dL — ABNORMAL HIGH (ref 65–99)
Glucose-Capillary: 119 mg/dL — ABNORMAL HIGH (ref 65–99)

## 2016-06-15 LAB — BASIC METABOLIC PANEL
Anion gap: 11 (ref 5–15)
BUN: 24 mg/dL — AB (ref 6–20)
CHLORIDE: 91 mmol/L — AB (ref 101–111)
CO2: 35 mmol/L — ABNORMAL HIGH (ref 22–32)
CREATININE: 0.8 mg/dL (ref 0.61–1.24)
Calcium: 9.5 mg/dL (ref 8.9–10.3)
GFR calc Af Amer: >60 mL/min (ref >60)
GFR calc non Af Amer: >60 mL/min (ref >60)
GLUCOSE: 165 mg/dL — AB (ref 65–99)
POTASSIUM: 3.7 mmol/L (ref 3.5–5.1)
Sodium: 137 mmol/L (ref 135–145)

## 2016-06-15 LAB — URIC ACID: URIC ACID, SERUM: 7.8 mg/dL — AB (ref 4.4–7.6)

## 2016-06-15 MED ORDER — MORPHINE SULFATE (PF) 4 MG/ML IV SOLN
0.5000 mg | Freq: Once | INTRAVENOUS | Status: AC
  Administered 2016-06-15: 0.52 mg via INTRAVENOUS
  Filled 2016-06-15: qty 1

## 2016-06-15 MED ORDER — METHYLPREDNISOLONE SODIUM SUCC 40 MG IJ SOLR
20.0000 mg | Freq: Once | INTRAMUSCULAR | Status: AC
  Administered 2016-06-15: 20 mg via INTRAVENOUS
  Filled 2016-06-15: qty 1

## 2016-06-15 MED ORDER — MORPHINE SULFATE (PF) 4 MG/ML IV SOLN: 0.5000 mg | Freq: Once | INTRAVENOUS | Status: DC

## 2016-06-15 MED ORDER — MORPHINE SULFATE (PF) 4 MG/ML IV SOLN
1.0000 mg | Freq: Once | INTRAVENOUS | Status: AC
  Administered 2016-06-15: 1 mg via INTRAVENOUS
  Filled 2016-06-15: qty 1

## 2016-06-15 NOTE — Progress Notes (Signed)
Pt c/o 9/10 pain in left foot. Foot is warm and erythematous . MD notified, new orders received.  Barbee Shropshire. Brigitte Pulse, RN

## 2016-06-15 NOTE — Progress Notes (Signed)
PROGRESS NOTE [facility]   [name]   NID:[id number] DOB: 1965-09-20  DOA: 06/10/2016 PCP: Boykin Nearing, MD   Brief Narrative:  50  hypertension,  morbidity obese Body mass index is 65.36 kg/m. diabetes mellitus IgA monoclonal gammopathy Angioedema to tramadol Prior cocaine and marijuana Chr resp failure with Lurline Idol since 2012 questionable history of congestive heart failure.   Presented to the emergency department 5/7   progressive shortness of breath for the past 5 days PTA associated symptoms include decreased urinary frequency and some chest discomfort.  Patient also have history of COPD but denies wheezing or coughing at the time.  Patient also report increasing swelling in his lower extremity and mild redness in both extremities that started about 2 days PTA.  Patient admitted with concerns of CHF exacerbation and ? Cellulitis.  Subjective:  Fair Seems to have some flashbacks about his intubations otherwsie well Requests we increasd ehis lasix Legs feel fair   Assessment & Plan: Acute respiratory failure due to ? acute congestive heart failure- Clinically presenting with symptoms of congestive heart failure shortness of breath, chest x-ray with pulmonary congestion and lower extremity edema. BNP unaccurate due to morbid obesity. High salt diet and heavy water drinker ~ 6L a day and has followed recommendations to cut back to ~ 2 liters Lasix 40 mg IV twice a day-->60 IV bid-->tid 06/14/16 for more brisk diuresis--might have to cut back IV lasix if creatinine spikes TNIs negative Unclear weigth 440--426-->425-->417 ECHO 60-65%-no criterion for Diastolic or sys hf Low-salt diet, strict I&O's, daily weights., -6.8 liter so far BP control Atelonol switched to Coreg, titrate as tolerated  Wean O2 as tolerated keep O2 sat > 91%  Lower extremity erythema Sacral decubitus without active infection,  Mild elevated WBC-has never been normal Low likelihood of  LE for cellulitis  D/c Keflex 5/9 Lower extremity Doppler, negative for DVT Sacral decubitus to be cleansed as per [place] nurse input  Type 2 diabetes mellitus - CBG's stable  Continue metformin SSI and monitor CBGs Ranges 119-254  Essential hypertension - BP slight above goal  Tenormin was changed to coreg in view of possible systolic abnormalities. Adjust Coreg as tolerated  Monitor BP  Possible hemorrhoidal bleed -Anatomy is very difficult to determine given his sacral decubitus but I do not see any overt ulcer or hemorrhoid Poor candidate for colonoscopy Has had 2 episodes of blood in stool 5/10-this has stopped --hemoglobin 14 range  COPD - doesn't seem to be on acute exacerbation OHS S/OSA unclear if on BiPAP at night Acute hypoxic respiratory failure as above Patient is trach, without oxygen dependence We'll keep DuoNeb's when necessary Wean O2 as tolerated--he says it gives him a HA Trach care per nursing protocol   Healing stage 4 sacral ulcer - POA Wound care recommendations appreciated   Morbid obesity Dietitian consult  DVT prophylaxis: Lovenox Code Status: Full code Family Communication: None at bedside Disposition Plan: Anticipate discharge to [place] in ~ 5-7 days when diuresed adequately   Consultants:   None   Procedures:   ECHO 06/11/16  Antimicrobials: Antibiotics Given (last 72 hours)    None       Objective: Vitals:   06/15/16 0752 06/15/16 1053 06/15/16 1500 06/15/16 1618  BP:   (!) 142/77   Pulse: 83 (!) 108 82 82  Resp: 20 20 20 20   Temp:   98.1 F (36.7 C)   TempSrc:   Oral   SpO2: 90% (!) 86% 93% Marland Kitchen)  88%  Weight:      Height:        Intake/Output Summary (Last 24 hours) at 06/15/16 1726 Last data filed at 06/15/16 1300  Gross per 24 hour  Intake             1800 ml  Output             3300 ml  Net            -1500 ml   Filed Weights   06/13/16 0505 06/14/16 0635 06/15/16 0615  Weight: (!) 193.4 kg (426 lb 4.8  oz) (!) 192.9 kg (425 lb 4.8 oz) (!) 189.3 kg (417 lb 4.8 oz)    Examination: Pleasant  Alert and oriented x 4 abd obese sluight tender s1 s 2no m/r/g Le swelling present but less than prior cta b, no rales rhonchi   Data Reviewed: I have personally reviewed following labs and imaging studies  CBC:  Recent Labs Lab 06/10/16 1307 06/12/16 0504 06/12/16 1255 06/14/16 0520  WBC 15.3* 15.9* 16.5* 17.4*  NEUTROABS 10.9* 9.9*  --  10.6*  HGB 13.8 14.6 14.2 14.1  HCT 39.9 43.4 40.9 41.8  MCV 75.7* 76.3* 75.2* 76.3*  PLT 264 PLATELET CLUMPS NOTED ON SMEAR, COUNT APPEARS ADEQUATE 270 017   Basic Metabolic Panel:  Recent Labs Lab 06/11/16 0519 06/12/16 0504 06/13/16 0517 06/14/16 0520 06/15/16 0538  NA 137 137 138 137 137  K 4.3 4.3 3.9 3.9 3.7  CL 94* 95* 95* 92* 91*  CO2 37* 34* 35* 36* 35*  GLUCOSE 172* 149* 195* 136* 165*  BUN 14 18 20  23* 24*  CREATININE 0.80 0.77 0.91 0.86 0.80  CALCIUM 9.4 9.4 9.0 9.4 9.5   GFR: Estimated Creatinine Clearance: 180.3 mL/min (by C-G formula based on SCr of 0.8 mg/dL). Liver Function Tests:  Recent Labs Lab 06/10/16 1307  AST 18  ALT 23  ALKPHOS 76  BILITOT 1.0  PROT 7.7  ALBUMIN 3.7   No results for input(s): LIPASE, AMYLASE in the last 168 hours. No results for input(s): AMMONIA in the last 168 hours. Coagulation Profile: No results for input(s): INR, PROTIME in the last 168 hours. Cardiac Enzymes:  Recent Labs Lab 06/10/16 2320 06/11/16 0519 06/11/16 1125  TROPONINI <0.03 <0.03 <0.03   BNP (last 3 results) No results for input(s): PROBNP in the last 8760 hours. HbA1C: No results for input(s): HGBA1C in the last 72 hours. CBG:  Recent Labs Lab 06/14/16 1706 06/14/16 1959 06/15/16 0800 06/15/16 1154 06/15/16 1628  GLUCAP 139* 119* 254* 114* 119*   Lipid Profile: No results for input(s): CHOL, HDL, LDLCALC, TRIG, CHOLHDL, LDLDIRECT in the last 72 hours. Thyroid Function Tests: No results for  input(s): TSH, T4TOTAL, FREET4, T3FREE, THYROIDAB in the last 72 hours. Anemia Panel: No results for input(s): VITAMINB12, FOLATE, FERRITIN, TIBC, IRON, RETICCTPCT in the last 72 hours. Sepsis Labs:  Recent Labs Lab 06/10/16 1317 06/10/16 1617  LATICACIDVEN 0.88 0.41*    Recent Results (from the past 240 hour(s))  MRSA PCR Screening     Status: None   Collection Time: 06/11/16  1:47 AM  Result Value Ref Range Status   MRSA by PCR NEGATIVE NEGATIVE Final    Comment:        The GeneXpert MRSA Assay (FDA approved for NASAL specimens only), is one component of a comprehensive MRSA colonization surveillance program. It is not intended to diagnose MRSA infection nor to guide or monitor treatment for MRSA infections.  Radiology Studies: No results found.  Scheduled Meds: . aspirin EC  81 mg Oral Daily  . atorvastatin  20 mg Oral Daily  . carvedilol  6.25 mg Oral BID WC  . enoxaparin (LOVENOX) injection  50 mg Subcutaneous Q12H  . furosemide  60 mg Intravenous TID  . insulin aspart  0-20 Units Subcutaneous TID WC  . metFORMIN  1,000 mg Oral BID WC  . multivitamin with minerals  1 tablet Oral Daily  . potassium chloride SA  20 mEq Oral Daily  . protein supplement shake  11 oz Oral TID BM  . sodium chloride flush  3 mL Intravenous Q12H   Continuous Infusions: . sodium chloride       LOS: 4 days   Verneita Griffes, MD [facility] ([phone]  If 7PM-7AM, please contact night-coverage [email or web address] Password  [facility] 06/15/2016, 5:26 PM

## 2016-06-15 NOTE — Progress Notes (Signed)
Assumed care of patient . Agree with previous Nurse assessment. A&O x 3 and trach patent and in place. Patient denies any concerns.  Barbee Shropshire. Brigitte Pulse, RN

## 2016-06-15 NOTE — Progress Notes (Signed)
RT called to bedside due to Pt low O2 sats.  Pt is currently on humidified room air aerosol trach collar.  Pt sats range anywhere from 70-82% but refuses to have O2 at this time.  RT explained the importance of the O2 and dangers associated with low O2 sats but Pt is adamant about not using it due to it causing his head to hurt.  RN aware, RT to monitor and assess as needed.

## 2016-06-15 NOTE — Progress Notes (Signed)
Pt has agreed to wear O2 now.  Pt placed on 35% ATC by RN, Pt tolerating well at this time.  RT to monitor and assess as needed.

## 2016-06-16 LAB — COMPREHENSIVE METABOLIC PANEL
ALT: 139 U/L — ABNORMAL HIGH (ref 17–63)
ANION GAP: 16 — AB (ref 5–15)
AST: 120 U/L — ABNORMAL HIGH (ref 15–41)
Albumin: 3.8 g/dL (ref 3.5–5.0)
Alkaline Phosphatase: 78 U/L (ref 38–126)
BUN: 26 mg/dL — ABNORMAL HIGH (ref 6–20)
CHLORIDE: 89 mmol/L — AB (ref 101–111)
CO2: 29 mmol/L (ref 22–32)
Calcium: 9.2 mg/dL (ref 8.9–10.3)
Creatinine, Ser: 1.01 mg/dL (ref 0.61–1.24)
GFR calc Af Amer: >60 mL/min (ref >60)
GFR calc non Af Amer: >60 mL/min (ref >60)
Glucose, Bld: 174 mg/dL — ABNORMAL HIGH (ref 65–99)
Potassium: 5.5 mmol/L — ABNORMAL HIGH (ref 3.5–5.1)
SODIUM: 134 mmol/L — AB (ref 135–145)
Total Bilirubin: 1.6 mg/dL — ABNORMAL HIGH (ref 0.3–1.2)
Total Protein: 8.1 g/dL (ref 6.5–8.1)

## 2016-06-16 LAB — GLUCOSE, CAPILLARY
GLUCOSE-CAPILLARY: 116 mg/dL — AB (ref 65–99)
GLUCOSE-CAPILLARY: 149 mg/dL — AB (ref 65–99)
Glucose-Capillary: 159 mg/dL — ABNORMAL HIGH (ref 65–99)
Glucose-Capillary: 181 mg/dL — ABNORMAL HIGH (ref 65–99)

## 2016-06-16 MED ORDER — OXYCODONE HCL 5 MG PO TABS
5.0000 mg | ORAL_TABLET | Freq: Four times a day (QID) | ORAL | Status: DC | PRN
  Administered 2016-06-16 – 2016-06-21 (×17): 5 mg via ORAL
  Filled 2016-06-16 (×17): qty 1

## 2016-06-16 MED ORDER — DIPHENHYDRAMINE HCL 50 MG/ML IJ SOLN
25.0000 mg | Freq: Once | INTRAMUSCULAR | Status: AC
  Administered 2016-06-16: 25 mg via INTRAVENOUS
  Filled 2016-06-16: qty 1

## 2016-06-16 MED ORDER — FUROSEMIDE 10 MG/ML IJ SOLN
60.0000 mg | Freq: Two times a day (BID) | INTRAMUSCULAR | Status: DC
  Administered 2016-06-16 – 2016-06-19 (×7): 60 mg via INTRAVENOUS
  Filled 2016-06-16 (×7): qty 6

## 2016-06-16 MED ORDER — COLCHICINE 0.6 MG PO TABS
0.6000 mg | ORAL_TABLET | Freq: Two times a day (BID) | ORAL | Status: DC
  Administered 2016-06-16 – 2016-06-21 (×11): 0.6 mg via ORAL
  Filled 2016-06-16 (×11): qty 1

## 2016-06-16 MED ORDER — HYDRALAZINE HCL 20 MG/ML IJ SOLN
10.0000 mg | INTRAMUSCULAR | Status: DC | PRN
  Administered 2016-06-16: 10 mg via INTRAVENOUS
  Filled 2016-06-16: qty 1

## 2016-06-16 MED ORDER — METOCLOPRAMIDE HCL 5 MG/ML IJ SOLN
10.0000 mg | Freq: Once | INTRAMUSCULAR | Status: AC
  Administered 2016-06-16: 10 mg via INTRAVENOUS
  Filled 2016-06-16: qty 2

## 2016-06-16 NOTE — Progress Notes (Signed)
PROGRESS NOTE [facility]   [name]   AOZ:[id number] DOB: [birth year]  DOA: 06/10/2016 PCP: Boykin Nearing, MD   Brief Narrative:  50  hypertension,  morbidity obese Body mass index is 65.25 kg/m. diabetes mellitus IgA monoclonal gammopathy Angioedema to tramadol Prior cocaine and marijuana Chr resp failure with Lurline Idol since 2012 questionable history of congestive heart failure.   Presented to the emergency department 5/7   progressive shortness of breath for the past 5 days PTA associated symptoms include decreased urinary frequency and some chest discomfort.  Patient also have history of COPD but denies wheezing or coughing at the time.  Patient also report increasing swelling in his lower extremity and mild redness in both extremities that started about 2 days PTA.  Patient admitted with concerns of CHF exacerbation and ? Cellulitis.  Subjective:  Fair Seems to have some flashbacks about his intubations otherwsie well Requests we increasd ehis lasix Legs feel fair   Assessment & Plan: Acute respiratory failure due to ? acute congestive heart failure- Clinically presenting with symptoms of congestive heart failure shortness of breath, chest x-ray with pulmonary congestion and lower extremity edema. BNP unaccurate due to morbid obesity. High salt diet and heavy water drinker ~ 6L a day and has followed recommendations to cut back to ~ 2 liters Lasix 40 mg IV twice a day-->60 IV bid-->tid 06/14/16 for more brisk diuresis--back down to BID dosing 5/13 TNIs negative Unclear weigth 440--426-->425-->417-->416 ECHO 60-65%-no criterion for Diastolic or sys hf Low-salt diet, strict I&O's, daily weights., -7.4 liter so far BP control Atelonol switched to Coreg, titrate as tolerated  Wean O2 as tolerated keep O2 sat > 91%  Lower extremity erythema Sacral decubitus without active infection,  Mild elevated WBC-has never been normal Low likelihood of LE for cellulitis   D/c Keflex 5/9 Lower extremity Doppler, negative for DVT Sacral decubitus to be cleansed as per [facility] nurse input  ?gout Giving colchicine 5/13 As pain not controlled-adding small amount Oxycodone added confirmed no allergy to this and has taken before  Type 2 diabetes mellitus - CBG's stable  Continue metformin SSI and monitor CBGs Ranges 116-159  Essential hypertension - BP slight above goal  Tenormin was changed to coreg in view of possible systolic abnormalities. Adjust Coreg as tolerated  Monitor BP  Possible hemorrhoidal bleed -Anatomy is very difficult to determine given his sacral decubitus but I do not see any overt ulcer or hemorrhoid Poor candidate for colonoscopy Has had 2 episodes of blood in stool 5/10-this has stopped --hemoglobin 14 range  COPD - doesn't seem to be on acute exacerbation OHS S/OSA unclear if on BiPAP at night Acute hypoxic respiratory failure as above Patient is trach, without oxygen dependence--his sats have been in the low 60-70's while asleep Wean O2 as tolerated--he says it gives him a HA--I have explained the risk of sudden death from this Trach care per nursing protocol  We'll keep DuoNeb's when necessary   Healing stage 4 sacral ulcer - POA Wound care recommendations appreciated   Morbid obesity Dietitian consult  DVT prophylaxis: Lovenox Code Status: Full code Family Communication: None at bedside Disposition Plan: Anticipate discharge to [place] in ~ 5-7 days when diuresed adequately   Consultants:   None   Procedures:   ECHO 06/11/16  Antimicrobials: Antibiotics Given (last 72 hours)    None       Objective: Vitals:   06/16/16 0706 06/16/16 0814 06/16/16 1116 06/16/16 1503  BP: 103/70  120/78  Pulse: 95 95 79 89  Resp:    (!) 21  Temp:    98.5 F (36.9 C)  TempSrc:    Oral  SpO2:  (!) 87% (!) 72% (!) 86%  Weight:      Height:        Intake/Output Summary (Last 24 hours) at 06/16/16 1509 Last  data filed at 06/16/16 1500  Gross per 24 hour  Intake             1140 ml  Output             1752 ml  Net             -612 ml   Filed Weights   06/14/16 0635 06/15/16 0615 06/16/16 0600  Weight: (!) 192.9 kg (425 lb 4.8 oz) (!) 189.3 kg (417 lb 4.8 oz) (!) 189 kg (416 lb 9.6 oz)    Examination:  Pleasant  Alert and oriented x 4 abd obese slight tender s1 s 2no m/r/g Le swelling present but less than prior cta b, no rales rhonchi LE softer but swelling and pain around L great toe--no erythema   Data Reviewed: I have personally reviewed following labs and imaging studies  CBC:  Recent Labs Lab 06/10/16 1307 06/12/16 0504 06/12/16 1255 06/14/16 0520  WBC 15.3* 15.9* 16.5* 17.4*  NEUTROABS 10.9* 9.9*  --  10.6*  HGB 13.8 14.6 14.2 14.1  HCT 39.9 43.4 40.9 41.8  MCV 75.7* 76.3* 75.2* 76.3*  PLT 264 PLATELET CLUMPS NOTED ON SMEAR, COUNT APPEARS ADEQUATE 270 062   Basic Metabolic Panel:  Recent Labs Lab 06/12/16 0504 06/13/16 0517 06/14/16 0520 06/15/16 0538 06/16/16 0510  NA 137 138 137 137 134*  K 4.3 3.9 3.9 3.7 5.5*  CL 95* 95* 92* 91* 89*  CO2 34* 35* 36* 35* 29  GLUCOSE 149* 195* 136* 165* 174*  BUN 18 20 23* 24* 26*  CREATININE 0.77 0.91 0.86 0.80 1.01  CALCIUM 9.4 9.0 9.4 9.5 9.2   GFR: Estimated Creatinine Clearance: 142.7 mL/min (by C-G formula based on SCr of 1.01 mg/dL). Liver Function Tests:  Recent Labs Lab 06/10/16 1307 06/16/16 0510  AST 18 120*  ALT 23 139*  ALKPHOS 76 78  BILITOT 1.0 1.6*  PROT 7.7 8.1  ALBUMIN 3.7 3.8   No results for input(s): LIPASE, AMYLASE in the last 168 hours. No results for input(s): AMMONIA in the last 168 hours. Coagulation Profile: No results for input(s): INR, PROTIME in the last 168 hours. Cardiac Enzymes:  Recent Labs Lab 06/10/16 2320 06/11/16 0519 06/11/16 1125  TROPONINI <0.03 <0.03 <0.03   BNP (last 3 results) No results for input(s): PROBNP in the last 8760 hours. HbA1C: No results  for input(s): HGBA1C in the last 72 hours. CBG:  Recent Labs Lab 06/15/16 1154 06/15/16 1628 06/15/16 2016 06/16/16 0723 06/16/16 1144  GLUCAP 114* 119* 124* 159* 116*   Lipid Profile: No results for input(s): CHOL, HDL, LDLCALC, TRIG, CHOLHDL, LDLDIRECT in the last 72 hours. Thyroid Function Tests: No results for input(s): TSH, T4TOTAL, FREET4, T3FREE, THYROIDAB in the last 72 hours. Anemia Panel: No results for input(s): VITAMINB12, FOLATE, FERRITIN, TIBC, IRON, RETICCTPCT in the last 72 hours. Sepsis Labs:  Recent Labs Lab 06/10/16 1317 06/10/16 1617  LATICACIDVEN 0.88 0.41*    Recent Results (from the past 240 hour(s))  MRSA PCR Screening     Status: None   Collection Time: 06/11/16  1:47 AM  Result Value Ref  Range Status   MRSA by PCR NEGATIVE NEGATIVE Final    Comment:        The GeneXpert MRSA Assay (FDA approved for NASAL specimens only), is one component of a comprehensive MRSA colonization surveillance program. It is not intended to diagnose MRSA infection nor to guide or monitor treatment for MRSA infections.      Radiology Studies: Dg Foot 2 Views Left  Result Date: 06/15/2016 CLINICAL DATA:  Acute left foot pain.  Initial encounter. EXAM: LEFT FOOT - 2 VIEW COMPARISON:  None. FINDINGS: There is no evidence of acute fracture, subluxation or dislocation. Degenerative changes at the first MTP joint and within the midfoot noted. A small calcaneal spur is present. Soft tissue swelling is present. IMPRESSION: Soft tissue swelling without acute bony abnormality. Degenerative changes as described. Electronically Signed   By: Margarette Canada M.D.   On: 06/15/2016 20:11    Scheduled Meds: . aspirin EC  81 mg Oral Daily  . atorvastatin  20 mg Oral Daily  . carvedilol  6.25 mg Oral BID WC  . colchicine  0.6 mg Oral BID  . enoxaparin (LOVENOX) injection  50 mg Subcutaneous Q12H  . furosemide  60 mg Intravenous TID  . insulin aspart  0-20 Units Subcutaneous TID WC   . metFORMIN  1,000 mg Oral BID WC  . multivitamin with minerals  1 tablet Oral Daily  . protein supplement shake  11 oz Oral TID BM  . sodium chloride flush  3 mL Intravenous Q12H   Continuous Infusions: . sodium chloride       LOS: 5 days   Verneita Griffes, MD [facility] ([phone]  If 7PM-7AM, please contact night-coverage [email or web address] Password [email or web address] 06/16/2016, 3:09 PM

## 2016-06-16 NOTE — Progress Notes (Signed)
Patient asked RN to remove oxygen and to be placed on room air. Patient c/o headache 8/10 associated with blurred vision. Writer explained to patient the importance of O2 and risks of low O2 sats.  Pt verbally understood risks and insisted on having the oxygen removed d/t headache. MD on call made aware of headache 8/10. New orders placed. RT aware that patient is back on room. Patient currently on humidified room air. Will continue to monitor closely.

## 2016-06-16 NOTE — Plan of Care (Signed)
Problem: Pain Managment: Goal: General experience of comfort will improve Outcome: Progressing C/o BLE pain w/ elevated uric acid level. Colchicine and oxycodone started for gout pain.

## 2016-06-16 NOTE — Progress Notes (Signed)
Pts O2 saturations are 87% at this time. He refuses to be on O2. He also refuses beside pulse oximetry monitoring. Will continue to spot check O2 saturations Q4 with trach check. Explained the Importance of O2. Pt continues to refuse.

## 2016-06-16 NOTE — Progress Notes (Signed)
Pts o2 saturation is 72%. Pt still refusing O2. Md aware. Md spoke with him about the importance of o2 but he is still refusing.

## 2016-06-16 NOTE — Plan of Care (Signed)
Problem: Physical Regulation: Goal: Ability to maintain clinical measurements within normal limits will improve Outcome: Not Progressing Pt refusing O2 in favor of humidified air via trach collar despite counselling by this writer, RT, and MD.   Problem: Fluid Volume: Goal: Ability to maintain a balanced intake and output will improve Outcome: Progressing Pt receiving IV lasix 60mg  TID.

## 2016-06-16 NOTE — Progress Notes (Signed)
Pt's O2 sats 74% at this time.  Pt refusing to be switched back to O2.  RT explained the importance of adding O2 therapy but Pt continues to refuse due to headaches.  RT to monitor and assess as needed.

## 2016-06-16 NOTE — Progress Notes (Signed)
Pt aware that his SpO2 is low but refuses oxygen at this time.  Pt using humidified room air as needed but refusing oxygen at this time.  MD aware

## 2016-06-17 LAB — BASIC METABOLIC PANEL
Anion gap: 12 (ref 5–15)
BUN: 28 mg/dL — AB (ref 6–20)
CHLORIDE: 86 mmol/L — AB (ref 101–111)
CO2: 36 mmol/L — AB (ref 22–32)
Calcium: 9.2 mg/dL (ref 8.9–10.3)
Creatinine, Ser: 0.84 mg/dL (ref 0.61–1.24)
GFR calc Af Amer: >60 mL/min (ref >60)
GFR calc non Af Amer: >60 mL/min (ref >60)
Glucose, Bld: 162 mg/dL — ABNORMAL HIGH (ref 65–99)
POTASSIUM: 3.8 mmol/L (ref 3.5–5.1)
SODIUM: 134 mmol/L — AB (ref 135–145)

## 2016-06-17 LAB — GLUCOSE, CAPILLARY
GLUCOSE-CAPILLARY: 147 mg/dL — AB (ref 65–99)
GLUCOSE-CAPILLARY: 103 mg/dL — AB (ref 65–99)
GLUCOSE-CAPILLARY: 139 mg/dL — AB (ref 65–99)
GLUCOSE-CAPILLARY: 144 mg/dL — AB (ref 65–99)

## 2016-06-17 MED ORDER — IBUPROFEN 200 MG PO TABS
600.0000 mg | ORAL_TABLET | Freq: Three times a day (TID) | ORAL | Status: DC
  Filled 2016-06-17: qty 3

## 2016-06-17 MED ORDER — ACETAMINOPHEN 325 MG PO TABS
650.0000 mg | ORAL_TABLET | Freq: Three times a day (TID) | ORAL | Status: AC
  Administered 2016-06-17 – 2016-06-20 (×10): 650 mg via ORAL
  Filled 2016-06-17 (×10): qty 2

## 2016-06-17 MED ORDER — OXYCODONE HCL 5 MG PO TABS
5.0000 mg | ORAL_TABLET | Freq: Once | ORAL | Status: AC
  Administered 2016-06-17: 5 mg via ORAL
  Filled 2016-06-17: qty 1

## 2016-06-17 MED ORDER — ENOXAPARIN SODIUM 60 MG/0.6ML ~~LOC~~ SOLN
50.0000 mg | Freq: Two times a day (BID) | SUBCUTANEOUS | Status: DC
  Administered 2016-06-17 – 2016-06-21 (×9): 50 mg via SUBCUTANEOUS
  Filled 2016-06-17 (×9): qty 0.6

## 2016-06-17 NOTE — Progress Notes (Signed)
Pt aware that his SpO2 is low but continues to refuse oxygen. Pt using humidified room air as needed for thinning secretions but refusing oxygen at this time. MD aware

## 2016-06-17 NOTE — Progress Notes (Signed)
Pt's SAT is 90% with Room Air ATC. Pt refuses oxygen and does not want to be sx.

## 2016-06-17 NOTE — Progress Notes (Signed)
Pt refuses 0xygen, and sx at this time.

## 2016-06-17 NOTE — Progress Notes (Signed)
RT tried to give patient a treatment as he asked but I tried to put it on oxygen because his O2 sats were 83%. Patient was upset because I tried to do this. He said then that he did not need it and that he does not want the oxygen. I apologized and asked him why did he ask for the treatment and he said that he did not need it because I tried to  put him on the oxygen. I continued to apologize and he said that I had an attitude. I then apologized again.

## 2016-06-17 NOTE — Progress Notes (Signed)
Pt aware that his SpO2 is low but continues to refuse oxygen at this time.  Pt using humidified room air as needed for humidification but refusing oxygen at this time.  MD aware

## 2016-06-17 NOTE — Progress Notes (Signed)
CSW following for disposition/ DC planning as pt is from ALF. Resides at [facility] ALF and plans to return at DC.  Met with pt today at bedside, confirms plan is unchanged.   Spoke with Varney Biles at [facility], advises no information needed currently.  CSW will continue following to facilitate return to ALF at DC.  Sharren Bridge, MSW, LCSW Clinical Social Work 06/17/2016 [phone]

## 2016-06-17 NOTE — Progress Notes (Signed)
PROGRESS NOTE [facility]   [name]   KAJ:[id number] DOB: [birth year]  DOA: 06/10/2016 PCP: Boykin Nearing, MD   Brief Narrative:  50  hypertension,  morbidity obese Body mass index is 65.25 kg/m. diabetes mellitus IgA monoclonal gammopathy Angioedema to tramadol Prior cocaine and marijuana Chr resp failure with Lurline Idol since 2012 questionable history of congestive heart failure.   Presented to the emergency department 5/7   progressive shortness of breath for the past 5 days PTA associated symptoms include decreased urinary frequency and some chest discomfort.  Patient also have history of COPD but denies wheezing or coughing at the time.  Patient also report increasing swelling in his lower extremity and mild redness in both extremities that started about 2 days PTA.  Patient admitted with concerns of CHF exacerbation and ? Cellulitis.  Subjective:  Doing ok No new issues other than LE swelling and pain Seem fair Not really any other c/o   Assessment & Plan: Acute respiratory failure due to ? acute congestive heart failure- compoenent OSA/OHSS but refuses Oxygen and will refuse CIpap or CPAP Clinically presenting with symptoms of congestive heart failure shortness of breath, chest x-ray with pulmonary congestion and lower extremity edema. BNP unaccurate due to morbid obesity. High salt diet and heavy water drinker ~ 6L a day and has followed recommendations to cut back to ~ 2 liters Lasix 40 mg IV twice a day-->60 IV bid-->tid 06/14/16 for more brisk diuresis--back down to BID dosing 5/13 TNIs negative Unclear weigth 440--426-->425-->417-->416 ECHO 60-65%-no criterion for Diastolic or sys hf Low-salt diet, strict I&O's, daily weights., -8.4 liter so far BP control Atelonol switched to Coreg, titrate as tolerated  Wean O2 as tolerated keep O2 sat > 91%  Lower extremity erythema Sacral decubitus without active infection,  Mild elevated WBC-has never been  normal Low likelihood of LE for cellulitis  D/c Keflex 5/9 Lower extremity Doppler, negative for DVT Sacral decubitus to be cleansed as per [facility] nurse input  ?gout Giving colchicine 5/13 As pain not controlled-adding small amount Oxycodone added confirmed no allergy to this and has taken before Added high dose tylenol 5/14-- to re-evalaute pain in am.  considering steroid burst as well  Type 2 diabetes mellitus - CBG's stable  Continue metformin SSI and monitor CBGs Ranges 139-103  Essential hypertension - BP slight above goal  Tenormin was changed to coreg in view of possible systolic abnormalities. Adjust Coreg as tolerated  Monitor BP  Possible hemorrhoidal bleed -Anatomy is very difficult to determine given his sacral decubitus but I do not see any overt ulcer or hemorrhoid Poor candidate for colonoscopy Has had 2 episodes of blood in stool 5/10-this has stopped --hemoglobin 14 range  COPD - doesn't seem to be on acute exacerbation OHS S/OSA no BiPAP at night Acute hypoxic respiratory failure as above Patient is trach, without oxygen dependence--his sats have been in the low 60-70's while asleep Wean O2 as tolerated--he says it gives him a HA--I have explained the risk of sudden death from this Trach care per nursing protocol  We'll keep DuoNeb's when necessary  Healing stage 4 sacral ulcer - POA Wound care recommendations appreciated   Morbid obesity Dietitian consult  DVT prophylaxis: Lovenox Code Status: Full code Family Communication: None at bedside Disposition Plan: Anticipate discharge to [place] in ~ 5-7 days when diuresed adequately   Consultants:   None   Procedures:   ECHO 06/11/16  Antimicrobials: Antibiotics Given (last 72 hours)  None       Objective: Vitals:   06/17/16 0530 06/17/16 0746 06/17/16 1145 06/17/16 1412  BP: (!) 144/81 109/69  (!) 145/79  Pulse: 86 87  79  Resp: (!) 25 (!) 24  18  Temp: 97.8 F (36.6 C)   97.7  F (36.5 C)  TempSrc: Oral   Oral  SpO2: (!) 86% 92% 90% 98%  Weight: (!) 189 kg (416 lb 9.6 oz)     Height:        Intake/Output Summary (Last 24 hours) at 06/17/16 1427 Last data filed at 06/17/16 1300  Gross per 24 hour  Intake             1200 ml  Output             1775 ml  Net             -575 ml   Filed Weights   06/15/16 0615 06/16/16 0600 06/17/16 0530  Weight: (!) 189.3 kg (417 lb 4.8 oz) (!) 189 kg (416 lb 9.6 oz) (!) 189 kg (416 lb 9.6 oz)    Examination:  Pleasant less sleepy laying flat for fir4st time Alert and oriented x 4 abd super-obese slight tender s1 s 2no m/r/g Le swelling present but less than prior cta b, no rales rhonchi LE softer but swelling and pain around L great toe--no erythema, no tednerness to passive ROmof Greater toes   Data Reviewed: I have personally reviewed following labs and imaging studies  CBC:  Recent Labs Lab 06/12/16 0504 06/12/16 1255 06/14/16 0520  WBC 15.9* 16.5* 17.4*  NEUTROABS 9.9*  --  10.6*  HGB 14.6 14.2 14.1  HCT 43.4 40.9 41.8  MCV 76.3* 75.2* 76.3*  PLT PLATELET CLUMPS NOTED ON SMEAR, COUNT APPEARS ADEQUATE 270 154   Basic Metabolic Panel:  Recent Labs Lab 06/13/16 0517 06/14/16 0520 06/15/16 0538 06/16/16 0510 06/17/16 0405  NA 138 137 137 134* 134*  K 3.9 3.9 3.7 5.5* 3.8  CL 95* 92* 91* 89* 86*  CO2 35* 36* 35* 29 36*  GLUCOSE 195* 136* 165* 174* 162*  BUN 20 23* 24* 26* 28*  CREATININE 0.91 0.86 0.80 1.01 0.84  CALCIUM 9.0 9.4 9.5 9.2 9.2   GFR: Estimated Creatinine Clearance: 171.6 mL/min (by C-G formula based on SCr of 0.84 mg/dL). Liver Function Tests:  Recent Labs Lab 06/16/16 0510  AST 120*  ALT 139*  ALKPHOS 78  BILITOT 1.6*  PROT 8.1  ALBUMIN 3.8   No results for input(s): LIPASE, AMYLASE in the last 168 hours. No results for input(s): AMMONIA in the last 168 hours. Coagulation Profile: No results for input(s): INR, PROTIME in the last 168 hours. Cardiac  Enzymes:  Recent Labs Lab 06/10/16 2320 06/11/16 0519 06/11/16 1125  TROPONINI <0.03 <0.03 <0.03   BNP (last 3 results) No results for input(s): PROBNP in the last 8760 hours. HbA1C: No results for input(s): HGBA1C in the last 72 hours. CBG:  Recent Labs Lab 06/16/16 1144 06/16/16 1640 06/16/16 2104 06/17/16 0731 06/17/16 1219  GLUCAP 116* 149* 181* 147* 103*   Lipid Profile: No results for input(s): CHOL, HDL, LDLCALC, TRIG, CHOLHDL, LDLDIRECT in the last 72 hours. Thyroid Function Tests: No results for input(s): TSH, T4TOTAL, FREET4, T3FREE, THYROIDAB in the last 72 hours. Anemia Panel: No results for input(s): VITAMINB12, FOLATE, FERRITIN, TIBC, IRON, RETICCTPCT in the last 72 hours. Sepsis Labs:  Recent Labs Lab 06/10/16 1617  LATICACIDVEN 0.41*    Recent  Results (from the past 240 hour(s))  MRSA PCR Screening     Status: None   Collection Time: 06/11/16  1:47 AM  Result Value Ref Range Status   MRSA by PCR NEGATIVE NEGATIVE Final    Comment:        The GeneXpert MRSA Assay (FDA approved for NASAL specimens only), is one component of a comprehensive MRSA colonization surveillance program. It is not intended to diagnose MRSA infection nor to guide or monitor treatment for MRSA infections.      Radiology Studies: Dg Foot 2 Views Left  Result Date: 06/15/2016 CLINICAL DATA:  Acute left foot pain.  Initial encounter. EXAM: LEFT FOOT - 2 VIEW COMPARISON:  None. FINDINGS: There is no evidence of acute fracture, subluxation or dislocation. Degenerative changes at the first MTP joint and within the midfoot noted. A small calcaneal spur is present. Soft tissue swelling is present. IMPRESSION: Soft tissue swelling without acute bony abnormality. Degenerative changes as described. Electronically Signed   By: Margarette Canada M.D.   On: 06/15/2016 20:11    Scheduled Meds: . acetaminophen  650 mg Oral Q8H  . aspirin EC  81 mg Oral Daily  . atorvastatin  20 mg Oral  Daily  . carvedilol  6.25 mg Oral BID WC  . colchicine  0.6 mg Oral BID  . enoxaparin (LOVENOX) injection  50 mg Subcutaneous Q12H  . furosemide  60 mg Intravenous BID  . insulin aspart  0-20 Units Subcutaneous TID WC  . metFORMIN  1,000 mg Oral BID WC  . multivitamin with minerals  1 tablet Oral Daily  . protein supplement shake  11 oz Oral TID BM  . sodium chloride flush  3 mL Intravenous Q12H   Continuous Infusions: . sodium chloride       LOS: 6 days   Verneita Griffes, MD [facility] ([phone]  If 7PM-7AM, please contact night-coverage [email or web address] Password [facility] 06/17/2016, 2:27 PM

## 2016-06-18 ENCOUNTER — Inpatient Hospital Stay (HOSPITAL_COMMUNITY): Payer: Medicare Other

## 2016-06-18 LAB — GLUCOSE, CAPILLARY
GLUCOSE-CAPILLARY: 117 mg/dL — AB (ref 65–99)
GLUCOSE-CAPILLARY: 252 mg/dL — AB (ref 65–99)
Glucose-Capillary: 172 mg/dL — ABNORMAL HIGH (ref 65–99)
Glucose-Capillary: 74 mg/dL (ref 65–99)

## 2016-06-18 MED ORDER — PREDNISONE 20 MG PO TABS
40.0000 mg | ORAL_TABLET | Freq: Every day | ORAL | Status: DC
  Administered 2016-06-18 – 2016-06-21 (×4): 40 mg via ORAL
  Filled 2016-06-18 (×4): qty 2

## 2016-06-18 NOTE — Consult Note (Signed)
   [facility] CM Inpatient Consult   06/18/2016  Valerian HORRACE HANAK [birth year] [id number]    Patient screened for potential [facility] Care Management services due to history of CHF and COPD. Chart reviewed. Noted current discharge plan is for ALF and has had only 1 hospitalization in past 6 months.  There are no identifiable [facility] Care Management needs at this time. If patient's post hospital needs change, please place a [facility] Care Management consult.   Marthenia Rolling, MSN-Ed, RN,BSN [facility] Liaison [phone]

## 2016-06-18 NOTE — Progress Notes (Signed)
Pt from ALF, plan to return.

## 2016-06-18 NOTE — Progress Notes (Signed)
PROGRESS NOTE [facility]   [name]   AST:[id number] DOB: 1965/05/26  DOA: 06/10/2016 PCP: Boykin Nearing, MD   Brief Narrative:   50  hypertension,  morbidity obese Body mass index is 65.3 kg/m. diabetes mellitus IgA monoclonal gammopathy Angioedema to tramadol Prior cocaine and marijuana Chr resp failure with Lurline Idol since 2012 questionable history of congestive heart failure.   Presented to the emergency department 5/7   progressive shortness of breath for the past 5 days PTA associated symptoms include decreased urinary frequency and some chest discomfort.  Patient also have history of COPD but denies wheezing or coughing at the time.  Patient also report increasing swelling in his lower extremity and mild redness in both extremities that started about 2 days PTA.  Patient admitted with concerns of CHF exacerbation and ? Cellulitis.  Subjective:  has LE swelling and pain Able to ambulate but only minimally-severe pain in LLE   Assessment & Plan: Acute respiratory failure due to ? acute congestive heart failure- compoenent OSA/OHSS but refuses Oxygen and will refuse CIpap or CPAP Clinically presenting with symptoms of congestive heart failure shortness of breath, chest x-ray with pulmonary congestion and lower extremity edema. BNP unaccurate due to morbid obesity. High salt diet and heavy water drinker ~ 6L a day and has followed recommendations to cut back to ~ 2 liters Lasix 40 mg IV twice a day-->60 IV bid-->tid 06/14/16 for more brisk diuresis--back down to BID dosing 5/13 TNIs negative Unclear weigth 440--426-->425-->417-->416 ECHO 60-65%-no criterion for Diastolic or sys hf Low-salt diet, strict I&O's, daily weights., -6.6 liter so far BP control Atelonol switched to Coreg, titrate as tolerated  Wean O2 as tolerated keep O2 sat > 91%  Lower extremity erythema Sacral decubitus without active infection,  Mild elevated WBC?gout -has never been  normal Low likelihood of LE for cellulitis  D/c Keflex 5/9 Lower extremity Doppler, negative for DVT Sacral decubitus to be cleansed as per [facility] nurse input  Giving colchicine 5/13 As pain not controlled-adding small amount Oxycodone added 5/14 confirmed no allergy to this and has taken before Added high dose tylenol 5/14--  CT ankle no occult fracture or anomally 5/15 Prednisone 40 burst started 5/15 as well as LE elevation and TED hose  Type 2 diabetes mellitus - CBG's stable  Continue metformin SSI and monitor CBGs Ranges 103-144  Essential hypertension - BP slight above goal  Tenormin was changed to coreg in view of possible systolic abnormalities. Adjust Coreg as tolerated  Monitor BP  Possible hemorrhoidal bleed -Anatomy is very difficult to determine given his sacral decubitus but I do not see any overt ulcer or hemorrhoid Poor candidate for colonoscopy Has had 2 episodes of blood in stool 5/10-this has stopped --hemoglobin 14 range  COPD - doesn't seem to be on acute exacerbation OHS S/OSA no BiPAP at night Acute hypoxic respiratory failure as above Patient is trach, without oxygen dependence--his sats have been in the low 60-70's while asleep Wean O2 as tolerated--he says it gives him a HA--I have explained the risk of sudden death from this Trach care per nursing protocol  We'll keep DuoNeb's when necessary  Healing stage 4 sacral ulcer - POA Wound care recommendations appreciated   Morbid obesity Dietitian consult  DVT prophylaxis: Lovenox Code Status: Full code Family Communication: None at bedside Disposition Plan: Anticipate dischargewhen diuresed adequately looking at [facility]   Consultants:   None   Procedures:   ECHO 06/11/16  Antimicrobials: Antibiotics Given (last 72 hours)  None       Objective: Vitals:   06/18/16 0644 06/18/16 0809 06/18/16 1138 06/18/16 1448  BP: 126/69   123/70  Pulse: 84 80 84 80  Resp: (!) 22 20 20 20   Temp:  97.8 F (36.6 C)   98.5 F (36.9 C)  TempSrc: Oral   Oral  SpO2: 91% 90% 96% 96%  Weight: (!) 189.1 kg (416 lb 14.4 oz)     Height:        Intake/Output Summary (Last 24 hours) at 06/18/16 1528 Last data filed at 06/18/16 0929  Gross per 24 hour  Intake             1080 ml  Output             1825 ml  Net             -745 ml   Filed Weights   06/16/16 0600 06/17/16 0530 06/18/16 0644  Weight: (!) 189 kg (416 lb 9.6 oz) (!) 189 kg (416 lb 9.6 oz) (!) 189.1 kg (416 lb 14.4 oz)    Examination:  Pleasant less sleepy laying flat for fir4st time Alert and oriented x 4 abd super-obese slight tender s1 s 2no m/r/g Le swelling present but less than prior cta b, no rales rhonchi LE softer but swelling and pain around L great toe--no erythema, no tednerness to passive ROmof Greater toes   Data Reviewed: I have personally reviewed following labs and imaging studies  CBC:  Recent Labs Lab 06/12/16 0504 06/12/16 1255 06/14/16 0520  WBC 15.9* 16.5* 17.4*  NEUTROABS 9.9*  --  10.6*  HGB 14.6 14.2 14.1  HCT 43.4 40.9 41.8  MCV 76.3* 75.2* 76.3*  PLT PLATELET CLUMPS NOTED ON SMEAR, COUNT APPEARS ADEQUATE 270 992   Basic Metabolic Panel:  Recent Labs Lab 06/13/16 0517 06/14/16 0520 06/15/16 0538 06/16/16 0510 06/17/16 0405  NA 138 137 137 134* 134*  K 3.9 3.9 3.7 5.5* 3.8  CL 95* 92* 91* 89* 86*  CO2 35* 36* 35* 29 36*  GLUCOSE 195* 136* 165* 174* 162*  BUN 20 23* 24* 26* 28*  CREATININE 0.91 0.86 0.80 1.01 0.84  CALCIUM 9.0 9.4 9.5 9.2 9.2   GFR: Estimated Creatinine Clearance: 171.6 mL/min (by C-G formula based on SCr of 0.84 mg/dL). Liver Function Tests:  Recent Labs Lab 06/16/16 0510  AST 120*  ALT 139*  ALKPHOS 78  BILITOT 1.6*  PROT 8.1  ALBUMIN 3.8   No results for input(s): LIPASE, AMYLASE in the last 168 hours. No results for input(s): AMMONIA in the last 168 hours. Coagulation Profile: No results for input(s): INR, PROTIME in the last 168  hours. Cardiac Enzymes: No results for input(s): CKTOTAL, CKMB, CKMBINDEX, TROPONINI in the last 168 hours. BNP (last 3 results) No results for input(s): PROBNP in the last 8760 hours. HbA1C: No results for input(s): HGBA1C in the last 72 hours. CBG:  Recent Labs Lab 06/17/16 1219 06/17/16 1639 06/17/16 2309 06/18/16 0726 06/18/16 1159  GLUCAP 103* 139* 144* 172* 74   Lipid Profile: No results for input(s): CHOL, HDL, LDLCALC, TRIG, CHOLHDL, LDLDIRECT in the last 72 hours. Thyroid Function Tests: No results for input(s): TSH, T4TOTAL, FREET4, T3FREE, THYROIDAB in the last 72 hours. Anemia Panel: No results for input(s): VITAMINB12, FOLATE, FERRITIN, TIBC, IRON, RETICCTPCT in the last 72 hours. Sepsis Labs: No results for input(s): PROCALCITON, LATICACIDVEN in the last 168 hours.  Recent Results (from the past 240 hour(s))  MRSA  PCR Screening     Status: None   Collection Time: 06/11/16  1:47 AM  Result Value Ref Range Status   MRSA by PCR NEGATIVE NEGATIVE Final    Comment:        The GeneXpert MRSA Assay (FDA approved for NASAL specimens only), is one component of a comprehensive MRSA colonization surveillance program. It is not intended to diagnose MRSA infection nor to guide or monitor treatment for MRSA infections.      Radiology Studies: Ct Foot Left Wo Contrast  Result Date: 06/18/2016 CLINICAL DATA:  Lower extremity soft tissue swelling. Pain around the left great toe. No tenderness. EXAM: CT OF THE LEFT FOOT WITHOUT CONTRAST TECHNIQUE: Multidetector CT imaging of the left foot was performed according to the standard protocol. Multiplanar CT image reconstructions were also generated. COMPARISON:  None. FINDINGS: Bones/Joint/Cartilage No acute fracture or dislocation. Small bony fragment adjacent to the medial malleolus likely reflecting sequela of prior avulsive injury. Normal alignment. No joint effusion. Mild osteoarthritis of the tibiotalar joint. Mild  osteoarthritis of the posterior subtalar joint. Mild osteoarthritis of the navicular- medial cuneiform joint. Moderate osteoarthritis of the first MTP joint. Small plantar calcaneal spur. Enthesopathic changes of the Achilles tendon insertion. Ligaments Ligaments are suboptimally evaluated by CT. Muscles and Tendons Muscles are normal. No muscle atrophy. Flexor, extensor, peroneal and Achilles tendons are grossly intact. Soft tissue No fluid collection or hematoma. No soft tissue mass. Generalized soft tissue edema and skin thickening of the distal left lower leg extending into the ankle and foot. IMPRESSION: 1.  No acute osseous injury of the left foot. 2. Generalized soft tissue edema and skin thickening of the distal left lower leg extending into the ankle and foot likely related to fluid overload chest history of CHF versus less likely cellulitis. 3. Moderate osteoarthritis of the first MTP joint. Electronically Signed   By: Kathreen Devoid   On: 06/18/2016 11:50    Scheduled Meds: . acetaminophen  650 mg Oral Q8H  . aspirin EC  81 mg Oral Daily  . atorvastatin  20 mg Oral Daily  . carvedilol  6.25 mg Oral BID WC  . colchicine  0.6 mg Oral BID  . enoxaparin (LOVENOX) injection  50 mg Subcutaneous Q12H  . furosemide  60 mg Intravenous BID  . insulin aspart  0-20 Units Subcutaneous TID WC  . metFORMIN  1,000 mg Oral BID WC  . multivitamin with minerals  1 tablet Oral Daily  . protein supplement shake  11 oz Oral TID BM  . sodium chloride flush  3 mL Intravenous Q12H   Continuous Infusions: . sodium chloride       LOS: 7 days   Verneita Griffes, MD [facility] ([phone]  If 7PM-7AM, please contact night-coverage [email or web address] Password [facility] 06/18/2016, 3:28 PM

## 2016-06-18 NOTE — Progress Notes (Signed)
Initial Nutrition Assessment  DOCUMENTATION CODES:   Morbid obesity  INTERVENTION:   Premier Protein TID, each supplement provides 160kcal and 30g protein.   Double protein portions with meals  MVI  NUTRITION DIAGNOSIS:   Increased nutrient needs related to catabolic illness, wound healing, other (see comment) (morbid obesity) as evidenced by increased estimated needs from protein.  GOAL:   Patient will meet greater than or equal to 90% of their needs  MONITOR:   PO intake, Supplement acceptance, Labs, Weight trends, Skin  ASSESSMENT:   51 y/o male with morbid obesity, diastolic CHF, COPD, HTN, HLD, DM2, obesity hypoventilation syndrome s/p trach presents to the ER with cc of leg swelling. Pt reports that over the past 3 days, his legs has had increased swelling and redness and pain.    Pt continues to do well; eating 100% and drinking supplements. Pt with weight gain since admit likely secondary to fluid changes. Pt continues to be diuresed.   Medications reviewed and include: aspirin, lovenox, lasix, insulin, metformin, KCl, MVI, oxycodone   Labs reviewed: Na 134(L), Cl 86(L), C02 36(H), BUN 28(H) AST- 120(H), ALT 139(H), tbili 1.6(H) Wbc- 17.4(H) cbgs- 174, 162 x 24 hrs AIC 8.1(H)  Diet Order:  Diet Carb Modified Fluid consistency: Thin; Room service appropriate? Yes; Fluid restriction: 2000 mL Fluid  Skin:  Wound (see comment) (unstageable pressure injury sacrum)  Last BM:  5/6  Height:   Ht Readings from Last 1 Encounters:  06/10/16 5\' 7"  (1.702 m)    Weight:   Wt Readings from Last 1 Encounters:  06/18/16 (!) 416 lb 14.4 oz (189.1 kg)    Ideal Body Weight:  67.2 kg  BMI:  Body mass index is 65.3 kg/m.  Estimated Nutritional Needs:   Kcal:  2700-3000kcal/day  Protein:  >150g/day  Fluid:  >2.7L/day   EDUCATION NEEDS:   Education needs no appropriate at this time  Koleen Distance, RD, [place] Pager #[phone]

## 2016-06-19 ENCOUNTER — Encounter: Payer: Self-pay | Admitting: Family Medicine

## 2016-06-19 DIAGNOSIS — I5033 Acute on chronic diastolic (congestive) heart failure: Secondary | ICD-10-CM

## 2016-06-19 LAB — GLUCOSE, CAPILLARY
GLUCOSE-CAPILLARY: 186 mg/dL — AB (ref 65–99)
Glucose-Capillary: 268 mg/dL — ABNORMAL HIGH (ref 65–99)
Glucose-Capillary: 225 mg/dL — ABNORMAL HIGH (ref 65–99)
Glucose-Capillary: 222 mg/dL — ABNORMAL HIGH (ref 65–99)

## 2016-06-19 LAB — CBC WITH DIFFERENTIAL/PLATELET
BASOS ABS: 0 10*3/uL (ref 0.0–0.1)
Basophils Relative: 0 %
EOS PCT: 2 %
Eosinophils Absolute: 0.3 10*3/uL (ref 0.0–0.7)
HEMATOCRIT: 41.6 % (ref 39.0–52.0)
Hemoglobin: 14.5 g/dL (ref 13.0–17.0)
LYMPHS ABS: 3.7 10*3/uL (ref 0.7–4.0)
Lymphocytes Relative: 25 %
MCH: 25.9 pg — ABNORMAL LOW (ref 26.0–34.0)
MCHC: 34.9 g/dL (ref 30.0–36.0)
MCV: 74.4 fL — ABNORMAL LOW (ref 78.0–100.0)
Monocytes Absolute: 1.2 10*3/uL — ABNORMAL HIGH (ref 0.1–1.0)
Monocytes Relative: 8 %
NEUTROS ABS: 9.7 10*3/uL — AB (ref 1.7–7.7)
Neutrophils Relative %: 65 %
Platelets: 264 10*3/uL (ref 150–400)
RBC: 5.59 MIL/uL (ref 4.22–5.81)
RDW: 15.5 % (ref 11.5–15.5)
WBC: 14.9 10*3/uL — ABNORMAL HIGH (ref 4.0–10.5)

## 2016-06-19 LAB — BASIC METABOLIC PANEL
Anion gap: 10 (ref 5–15)
BUN: 23 mg/dL — AB (ref 6–20)
CHLORIDE: 89 mmol/L — AB (ref 101–111)
CO2: 35 mmol/L — ABNORMAL HIGH (ref 22–32)
Calcium: 8.9 mg/dL (ref 8.9–10.3)
Creatinine, Ser: 0.74 mg/dL (ref 0.61–1.24)
GFR calc Af Amer: >60 mL/min (ref >60)
GFR calc non Af Amer: >60 mL/min (ref >60)
GLUCOSE: 294 mg/dL — AB (ref 65–99)
Potassium: 3.8 mmol/L (ref 3.5–5.1)
Sodium: 134 mmol/L — ABNORMAL LOW (ref 135–145)

## 2016-06-19 MED ORDER — INSULIN GLARGINE 100 UNIT/ML ~~LOC~~ SOLN
4.0000 [IU] | Freq: Every day | SUBCUTANEOUS | Status: DC
  Administered 2016-06-19 – 2016-06-20 (×2): 4 [IU] via SUBCUTANEOUS
  Filled 2016-06-19 (×3): qty 0.04

## 2016-06-19 MED ORDER — FUROSEMIDE 10 MG/ML IJ SOLN
60.0000 mg | Freq: Three times a day (TID) | INTRAMUSCULAR | Status: DC
  Administered 2016-06-19 – 2016-06-21 (×5): 60 mg via INTRAVENOUS
  Filled 2016-06-19 (×5): qty 6

## 2016-06-19 NOTE — Care Management Note (Signed)
Case Management Note  Patient Details  Name: [name] MRN: [id number] Date of Birth: Mar 05, 1965  Subjective/Objective:  Spoke to patient about LTAC-patient declines LTAC, he wants to return back to ALF([facility]) @ d/c. Patient indep w/trach,ambulates independently.CSW notified.               Action/Plan:d/c ALF.   Expected Discharge Date:   (unknown)               Expected Discharge Plan:  Assisted Living / Rest Home  In-House Referral:  Clinical Social Work  Discharge planning Services  CM Consult  Post Acute Care Choice:    Choice offered to:     DME Arranged:    DME Agency:     HH Arranged:    HH Agency:     Status of Service:  In process, will continue to follow  If discussed at Long Length of Stay Meetings, dates discussed:    Additional Comments:  Dessa Phi, RN 06/19/2016, 2:21 PM

## 2016-06-19 NOTE — Progress Notes (Signed)
PROGRESS NOTE  [name] SLH:[id number] DOB: 10-30-1965 DOA: 06/10/2016 PCP: Boykin Nearing, MD  HPI/Recap of past 33 hours: 51 year old with past history of morbid obesity, diabetes mellitus and chronic respiratory failure with tracheostomy presented to emergency room on 5/7 with almost 1 week of progressively worsening shortness of breath. He was found to be in acute on chronic respiratory failure felt to be secondary to in part CHF, sleep apnea and noncompliance in refusing CPAP and oxygen.  Patient treated with diuretics. Echocardiogram noted preserved ejection fraction. With fluid restriction, patient has responded well and her weight has dropped from 24 pounds. Patient also reported some ankle pain and swelling. Questionable gout. Started on prednisone. Even here, he continues to refuse supplemental oxygen and CPAP.  Today, patient doing okay. Attempted TED hose with pain and therefore does not want to try to again. Otherwise no complaints.  Assessment/Plan: Active Problems:   Severe obesity (BMI >= 40) ([place]): Patient meets criteria with elevated BMI greater than 40   Type II diabetes mellitus ([place]) controlled with complication: On metformin plus sliding-scale. Well controlled. CBGs trending upward, once we started steroids. Have started Lantus   Acute on chronic diastolic CHF (congestive heart failure), NYHA class 1 ([place]): Increase Lasix given stability of renal function.   Essential hypertension   Acute on chronic respiratory failure secondary to underlying COPD (chronic obstructive pulmonary disease) ([place]), obstructive sleep apnea with noncompliance of oxygen and CPAP and heart failure   Tracheostomy dependent (HCC)   SOB (shortness of breath)   Pressure injury of skin   Code Status: Full code  Family Communication: Declined for me to call family  Disposition Plan: Anticipate discharge back to assisted living facility, hopefully few days once fully diuresed     Consultants:  None   Procedures:  Echocardiogram done 5/8: Preserved ejection fraction, did not, and diastolic dysfunction   Antimicrobials:  None   DVT prophylaxis: Lovenox   Objective: Vitals:   06/19/16 1130 06/19/16 1430 06/19/16 1524 06/19/16 1548  BP:  (!) 136/95    Pulse: 85 89 88   Resp: 18 19 18    Temp:  98.5 F (36.9 C)    TempSrc:  Oral    SpO2: 90% 94% 95% 95%  Weight:      Height:        Intake/Output Summary (Last 24 hours) at 06/19/16 1812 Last data filed at 06/19/16 1430  Gross per 24 hour  Intake              480 ml  Output             2276 ml  Net            -1796 ml   Filed Weights   06/16/16 0600 06/17/16 0530 06/18/16 0644  Weight: (!) 189 kg (416 lb 9.6 oz) (!) 189 kg (416 lb 9.6 oz) (!) 189.1 kg (416 lb 14.4 oz)    Exam:   General:  Alert and oriented 3, no acute distress, chronic trach   Cardiovascular: Soft, regular rate and rhythm, L5-B2, 2/6 systolic ejection murmur   Respiratory: Decreased breath sounds throughout   Abdomen: Soft, nontender, nondistended, hypoactive bowel sounds   Musculoskeletal: No clubbing cyanosis, 2+ pitting edema from the knees down   Skin: No erythema, skin breaks tears or lesions  Psychiatry: Patient is appropriate, no evidence of psychoses    Data Reviewed: CBC:  Recent Labs Lab 06/14/16 0520 06/19/16 0926  WBC 17.4* 14.9*  NEUTROABS  10.6* 9.7*  HGB 14.1 14.5  HCT 41.8 41.6  MCV 76.3* 74.4*  PLT 299 456   Basic Metabolic Panel:  Recent Labs Lab 06/14/16 0520 06/15/16 0538 06/16/16 0510 06/17/16 0405 06/19/16 0926  NA 137 137 134* 134* 134*  K 3.9 3.7 5.5* 3.8 3.8  CL 92* 91* 89* 86* 89*  CO2 36* 35* 29 36* 35*  GLUCOSE 136* 165* 174* 162* 294*  BUN 23* 24* 26* 28* 23*  CREATININE 0.86 0.80 1.01 0.84 0.74  CALCIUM 9.4 9.5 9.2 9.2 8.9   GFR: Estimated Creatinine Clearance: 180.2 mL/min (by C-G formula based on SCr of 0.74 mg/dL). Liver Function Tests:  Recent  Labs Lab 06/16/16 0510  AST 120*  ALT 139*  ALKPHOS 78  BILITOT 1.6*  PROT 8.1  ALBUMIN 3.8   No results for input(s): LIPASE, AMYLASE in the last 168 hours. No results for input(s): AMMONIA in the last 168 hours. Coagulation Profile: No results for input(s): INR, PROTIME in the last 168 hours. Cardiac Enzymes: No results for input(s): CKTOTAL, CKMB, CKMBINDEX, TROPONINI in the last 168 hours. BNP (last 3 results) No results for input(s): PROBNP in the last 8760 hours. HbA1C: No results for input(s): HGBA1C in the last 72 hours. CBG:  Recent Labs Lab 06/18/16 1720 06/18/16 2122 06/19/16 0806 06/19/16 1130 06/19/16 1706  GLUCAP 117* 252* 186* 268* 225*   Lipid Profile: No results for input(s): CHOL, HDL, LDLCALC, TRIG, CHOLHDL, LDLDIRECT in the last 72 hours. Thyroid Function Tests: No results for input(s): TSH, T4TOTAL, FREET4, T3FREE, THYROIDAB in the last 72 hours. Anemia Panel: No results for input(s): VITAMINB12, FOLATE, FERRITIN, TIBC, IRON, RETICCTPCT in the last 72 hours. Urine analysis:    Component Value Date/Time   COLORURINE YELLOW 06/10/2016 1254   APPEARANCEUR CLEAR 06/10/2016 1254   LABSPEC 1.013 06/10/2016 1254   PHURINE 7.0 06/10/2016 1254   GLUCOSEU NEGATIVE 06/10/2016 1254   HGBUR NEGATIVE 06/10/2016 1254   BILIRUBINUR NEGATIVE 06/10/2016 1254   KETONESUR NEGATIVE 06/10/2016 1254   PROTEINUR 100 (A) 06/10/2016 1254   UROBILINOGEN 2.0 (H) 12/13/2010 0430   NITRITE NEGATIVE 06/10/2016 1254   LEUKOCYTESUR TRACE (A) 06/10/2016 1254   Sepsis Labs: @LABRCNTIP (procalcitonin:4,lacticidven:4)  ) Recent Results (from the past 240 hour(s))  MRSA PCR Screening     Status: None   Collection Time: 06/11/16  1:47 AM  Result Value Ref Range Status   MRSA by PCR NEGATIVE NEGATIVE Final    Comment:        The GeneXpert MRSA Assay (FDA approved for NASAL specimens only), is one component of a comprehensive MRSA colonization surveillance program. It is  not intended to diagnose MRSA infection nor to guide or monitor treatment for MRSA infections.       Studies: No results found.  Scheduled Meds: . acetaminophen  650 mg Oral Q8H  . aspirin EC  81 mg Oral Daily  . atorvastatin  20 mg Oral Daily  . carvedilol  6.25 mg Oral BID WC  . colchicine  0.6 mg Oral BID  . enoxaparin (LOVENOX) injection  50 mg Subcutaneous Q12H  . furosemide  60 mg Intravenous BID  . insulin aspart  0-20 Units Subcutaneous TID WC  . metFORMIN  1,000 mg Oral BID WC  . multivitamin with minerals  1 tablet Oral Daily  . predniSONE  40 mg Oral QAC breakfast  . protein supplement shake  11 oz Oral TID BM  . sodium chloride flush  3 mL Intravenous Q12H  Continuous Infusions: . sodium chloride       LOS: 8 days     Annita Brod, MD [facility] Pager [phone]  If 7PM-7AM, please contact night-coverage [email or web address] Password [facility] 06/19/2016, 6:12 PM

## 2016-06-20 DIAGNOSIS — J9611 Chronic respiratory failure with hypoxia: Secondary | ICD-10-CM

## 2016-06-20 LAB — BASIC METABOLIC PANEL
ANION GAP: 9 (ref 5–15)
BUN: 23 mg/dL — ABNORMAL HIGH (ref 6–20)
CO2: 37 mmol/L — ABNORMAL HIGH (ref 22–32)
Calcium: 9.1 mg/dL (ref 8.9–10.3)
Chloride: 89 mmol/L — ABNORMAL LOW (ref 101–111)
Creatinine, Ser: 0.77 mg/dL (ref 0.61–1.24)
GLUCOSE: 231 mg/dL — AB (ref 65–99)
POTASSIUM: 3.4 mmol/L — AB (ref 3.5–5.1)
Sodium: 135 mmol/L (ref 135–145)

## 2016-06-20 LAB — GLUCOSE, CAPILLARY
GLUCOSE-CAPILLARY: 190 mg/dL — AB (ref 65–99)
GLUCOSE-CAPILLARY: 204 mg/dL — AB (ref 65–99)
GLUCOSE-CAPILLARY: 191 mg/dL — AB (ref 65–99)
Glucose-Capillary: 179 mg/dL — ABNORMAL HIGH (ref 65–99)

## 2016-06-20 NOTE — Progress Notes (Signed)
PROGRESS NOTE  MAXXWELL EDGETT IOX:[id number] DOB: Jan 10, 1966 DOA: 06/10/2016 PCP: Boykin Nearing, MD  HPI/Recap of past 100 hours: 51 year old with past history of morbid obesity, diabetes mellitus and chronic respiratory failure with tracheostomy presented to emergency room on 5/7 with almost 1 week of progressively worsening shortness of breath. He was found to be in acute on chronic respiratory failure felt to be secondary to in part CHF, sleep apnea and noncompliance in refusing CPAP and oxygen.  Patient treated with diuretics. Echocardiogram noted preserved ejection fraction. With fluid restriction, patient has responded well and her weight has dropped from 24 pounds. Patient also reported some ankle pain and swelling. Questionable gout. Started on prednisone. Even here, he continues to refuse supplemental oxygen and CPAP.  Patient's Lasix increased on 5/16 and his diuresis has responded well. Weight continues to come down. Currently almost 13 L deficient. Patient states breathing is steady. Also complains of less leg pain.  Assessment/Plan: Active Problems:   Severe obesity (BMI >= 40) ([place]): Patient meets criteria with elevated BMI greater than 40   Type II diabetes mellitus ([place]) controlled with complication: On metformin plus sliding-scale. Well controlled. CBGs trending upward, once we started steroids. Have started Lantus   Acute on chronic diastolic CHF (congestive heart failure), NYHA class 1 ([place]): Increased Lasix given stability of renal function.  Currently on 60 mg IV 3 times a day. Renal function staying stable. Almost 13 L down.  Suspected gout: On prednisone, day 3.    Essential hypertension   Acute on chronic respiratory failure secondary to underlying COPD (chronic obstructive pulmonary disease) ([place]), obstructive sleep apnea with noncompliance of oxygen and CPAP and heart failure   Tracheostomy dependent (HCC)   SOB (shortness of breath)   Pressure injury of  skin   Code Status: Full code  Family Communication: Declined for me to call family  Disposition Plan: Anticipate discharge back to assisted living facility once fully diuresed. Hopefully this will be just a few days.   Consultants:  None   Procedures:  Echocardiogram done 5/8: Preserved ejection fraction, did not, and diastolic dysfunction   Antimicrobials:  None   DVT prophylaxis: Lovenox   Objective: Vitals:   06/20/16 0000 06/20/16 0330 06/20/16 0633 06/20/16 0742  BP:   127/69   Pulse: 84 80 83   Resp: 18 18 18    Temp:   97.9 F (36.6 C)   TempSrc:   Oral   SpO2: 92% (!) 88% (!) 89% (!) 85%  Weight:   (!) 190.2 kg (419 lb 6.4 oz)   Height:        Intake/Output Summary (Last 24 hours) at 06/20/16 1332 Last data filed at 06/20/16 0930  Gross per 24 hour  Intake              720 ml  Output             2726 ml  Net            -2006 ml   Filed Weights   06/17/16 0530 06/18/16 0644 06/20/16 0633  Weight: (!) 189 kg (416 lb 9.6 oz) (!) 189.1 kg (416 lb 14.4 oz) (!) 190.2 kg (419 lb 6.4 oz)    Exam:   General:  Alert and oriented 3, no acute distress, chronic trach   Cardiovascular: Soft, regular rate and rhythm, Q6-S3, 2/6 systolic ejection murmur   Respiratory: Decreased breath sounds throughout   Abdomen: Soft, nontender, nondistended, hypoactive bowel sounds   Musculoskeletal:  No clubbing cyanosis, 2+ pitting edema from the knees down , Less tender  Skin: No erythema, skin breaks tears or lesions  Psychiatry: Patient is appropriate, no evidence of psychoses    Data Reviewed: CBC:  Recent Labs Lab 06/14/16 0520 06/19/16 0926  WBC 17.4* 14.9*  NEUTROABS 10.6* 9.7*  HGB 14.1 14.5  HCT 41.8 41.6  MCV 76.3* 74.4*  PLT 299 974   Basic Metabolic Panel:  Recent Labs Lab 06/15/16 0538 06/16/16 0510 06/17/16 0405 06/19/16 0926 06/20/16 0533  NA 137 134* 134* 134* 135  K 3.7 5.5* 3.8 3.8 3.4*  CL 91* 89* 86* 89* 89*  CO2 35* 29  36* 35* 37*  GLUCOSE 165* 174* 162* 294* 231*  BUN 24* 26* 28* 23* 23*  CREATININE 0.80 1.01 0.84 0.74 0.77  CALCIUM 9.5 9.2 9.2 8.9 9.1   GFR: Estimated Creatinine Clearance: 180.8 mL/min (by C-G formula based on SCr of 0.77 mg/dL). Liver Function Tests:  Recent Labs Lab 06/16/16 0510  AST 120*  ALT 139*  ALKPHOS 78  BILITOT 1.6*  PROT 8.1  ALBUMIN 3.8   No results for input(s): LIPASE, AMYLASE in the last 168 hours. No results for input(s): AMMONIA in the last 168 hours. Coagulation Profile: No results for input(s): INR, PROTIME in the last 168 hours. Cardiac Enzymes: No results for input(s): CKTOTAL, CKMB, CKMBINDEX, TROPONINI in the last 168 hours. BNP (last 3 results) No results for input(s): PROBNP in the last 8760 hours. HbA1C: No results for input(s): HGBA1C in the last 72 hours. CBG:  Recent Labs Lab 06/19/16 1130 06/19/16 1706 06/19/16 2059 06/20/16 0728 06/20/16 1118  GLUCAP 268* 225* 222* 190* 204*   Lipid Profile: No results for input(s): CHOL, HDL, LDLCALC, TRIG, CHOLHDL, LDLDIRECT in the last 72 hours. Thyroid Function Tests: No results for input(s): TSH, T4TOTAL, FREET4, T3FREE, THYROIDAB in the last 72 hours. Anemia Panel: No results for input(s): VITAMINB12, FOLATE, FERRITIN, TIBC, IRON, RETICCTPCT in the last 72 hours. Urine analysis:    Component Value Date/Time   COLORURINE YELLOW 06/10/2016 1254   APPEARANCEUR CLEAR 06/10/2016 1254   LABSPEC 1.013 06/10/2016 1254   PHURINE 7.0 06/10/2016 1254   GLUCOSEU NEGATIVE 06/10/2016 1254   HGBUR NEGATIVE 06/10/2016 1254   BILIRUBINUR NEGATIVE 06/10/2016 1254   KETONESUR NEGATIVE 06/10/2016 1254   PROTEINUR 100 (A) 06/10/2016 1254   UROBILINOGEN 2.0 (H) 12/13/2010 0430   NITRITE NEGATIVE 06/10/2016 1254   LEUKOCYTESUR TRACE (A) 06/10/2016 1254   Sepsis Labs: @LABRCNTIP (procalcitonin:4,lacticidven:4)  ) Recent Results (from the past 240 hour(s))  MRSA PCR Screening     Status: None    Collection Time: 06/11/16  1:47 AM  Result Value Ref Range Status   MRSA by PCR NEGATIVE NEGATIVE Final    Comment:        The GeneXpert MRSA Assay (FDA approved for NASAL specimens only), is one component of a comprehensive MRSA colonization surveillance program. It is not intended to diagnose MRSA infection nor to guide or monitor treatment for MRSA infections.       Studies: No results found.  Scheduled Meds: . acetaminophen  650 mg Oral Q8H  . aspirin EC  81 mg Oral Daily  . atorvastatin  20 mg Oral Daily  . carvedilol  6.25 mg Oral BID WC  . colchicine  0.6 mg Oral BID  . enoxaparin (LOVENOX) injection  50 mg Subcutaneous Q12H  . furosemide  60 mg Intravenous TID  . insulin aspart  0-20 Units Subcutaneous TID WC  .  insulin glargine  4 Units Subcutaneous QHS  . metFORMIN  1,000 mg Oral BID WC  . multivitamin with minerals  1 tablet Oral Daily  . predniSONE  40 mg Oral QAC breakfast  . protein supplement shake  11 oz Oral TID BM  . sodium chloride flush  3 mL Intravenous Q12H    Continuous Infusions: . sodium chloride       LOS: 9 days     Annita Brod, MD [facility] Pager [phone]  If 7PM-7AM, please contact night-coverage [email or web address] Password [email or web address] 06/20/2016, 1:32 PM

## 2016-06-20 NOTE — Progress Notes (Signed)
Discussed with Pt regarding use of bedside pulse oximetry and Pt states "I won't wear that on my finger I know what it's for"

## 2016-06-20 NOTE — Progress Notes (Signed)
PT refuses to utilize bedside pulse oximetry.

## 2016-06-20 NOTE — Care Management Important Message (Signed)
Important Message  Patient Details  Name: [name] MRN: [id number] Date of Birth: September 13, 1965   Medicare Important Message Given:  Yes    Kerin Salen 06/20/2016, 10:58 AMImportant Message  Patient Details  Name: [name] MRN: [id number] Date of Birth: 1965-03-31   Medicare Important Message Given:  Yes    Kerin Salen 06/20/2016, 10:58 AM

## 2016-06-21 LAB — GLUCOSE, CAPILLARY
Glucose-Capillary: 165 mg/dL — ABNORMAL HIGH (ref 65–99)
Glucose-Capillary: 200 mg/dL — ABNORMAL HIGH (ref 65–99)

## 2016-06-21 MED ORDER — ADULT MULTIVITAMIN W/MINERALS CH: 1.0000 | ORAL_TABLET | Freq: Every day | ORAL | Status: AC

## 2016-06-21 MED ORDER — FUROSEMIDE 40 MG PO TABS: 60.0000 mg | ORAL_TABLET | Freq: Two times a day (BID) | ORAL | 2 refills | Status: DC

## 2016-06-21 MED ORDER — SITAGLIPTIN PHOSPHATE 100 MG PO TABS: 100.0000 mg | ORAL_TABLET | Freq: Every day | ORAL | 2 refills | Status: AC

## 2016-06-21 MED ORDER — CARVEDILOL 6.25 MG PO TABS: 6.2500 mg | ORAL_TABLET | Freq: Two times a day (BID) | ORAL | 2 refills | Status: AC

## 2016-06-21 MED ORDER — ASPIRIN 81 MG PO TBEC: 81.0000 mg | DELAYED_RELEASE_TABLET | Freq: Every day | ORAL | Status: AC

## 2016-06-21 NOTE — Progress Notes (Signed)
Pt called for neb. Rt was going to give neb with 02. Pt refused neb due to rt running it with 02. Rt explained to pt that this was my first time given him pt care and I did not no he wanted neb ran on air only. I apologized to him and try to encourage him to let me give him a neb. Pt still refused and ran me out of his room. Neb was pored out in trash can and rt left the room.   Pt requested to speak with RN.

## 2016-06-21 NOTE — NC FL2 (Signed)
[place] LEVEL OF CARE SCREENING TOOL     IDENTIFICATION  Patient Name: [name] Birthdate: 12-Aug-1965 Sex: male Admission Date (Current Location): 06/10/2016  [facility] and [place] Number:  Herbalist and Address:  [facility],  [address] [place], [place]      Provider Number:    Attending Physician Name and Address:  Isaac Bliss, [place]  Relative Name and Phone Number:       Current Level of Care: Hospital Recommended Level of Care: [place] Prior Approval Number:    Date Approved/Denied:   PASRR Number: [id number]  Discharge Plan: Other (Comment) (ALF)    Current Diagnoses: Patient Active Problem List   Diagnosis Date Noted  . Pressure injury of skin 06/15/2016  . SOB (shortness of breath) 06/10/2016  . Pain of left hip joint 11/21/2015  . Bilateral chronic knee pain 07/25/2015  . Tracheostomy dependent ([place]) 03/10/2015  . COPD (chronic obstructive pulmonary disease) ([place])   . Essential hypertension 01/20/2015  . DOE (dyspnea on exertion) 01/20/2015  . Toenail fungus 07/12/2014  . Acute on chronic diastolic CHF (congestive heart failure), NYHA class 1 ([place]) 06/15/2014  . Hyperlipidemia associated with type 2 diabetes mellitus ([place]) 05/05/2014  . Cardiomegaly 05/02/2014  . Nasal congestion 05/02/2014  . Current smoker 05/02/2014  . Severe obesity (BMI >= 40) ([place]) 11/28/2013  . Type II diabetes mellitus ([place]) 11/28/2013  . Left anterior fascicular block 11/27/2013  . Poor social situation 11/27/2013  . Monoclonal gammopathy 12/14/2010  . Decubitus ulcer of sacral area 12/10/2010  . Obesity hypoventilation syndrome ([place]) 12/09/2010    Orientation RESPIRATION BLADDER Height & Weight     Self, Time, Situation, Place  Tracheostomy (5L) Continent Weight: (!) 422 lb (191.4 kg) Height:  '5\' 7"'$  (170.2 cm)  BEHAVIORAL SYMPTOMS/MOOD NEUROLOGICAL BOWEL NUTRITION STATUS      Continent Diet (low  sodium)  AMBULATORY STATUS COMMUNICATION OF NEEDS Skin   Limited Assist Verbally PU Stage and Appropriate Care  Pressure injury stage IV Location: sacrum Dressing type: moist to dry Dressing frequency: daily                      Personal Care Assistance Level of Assistance  Bathing, Feeding, Dressing Bathing Assistance: Limited assistance Feeding assistance: Independent Dressing Assistance: Limited assistance     Functional Limitations Info  Sight, Hearing, Speech Sight Info: Adequate Hearing Info: Adequate Speech Info: Adequate    SPECIAL CARE FACTORS FREQUENCY                       Contractures Contractures Info: Not present    Additional Factors Info  Code Status, Allergies Code Status Info: full Allergies Info: Tramadol, Ibuprofen, Robaxin Methocarbamol           Current Medications (06/21/2016):  This is the current hospital active medication list Current Facility-Administered Medications  Medication Dose Route Frequency Provider Last Rate Last Dose  . 0.9 %  sodium chloride infusion  250 mL Intravenous PRN Patrecia Pour, Christean Grief, MD      . aspirin EC tablet 81 mg  81 mg Oral Daily Patrecia Pour, Christean Grief, MD   81 mg at 06/21/16 1002  . atorvastatin (LIPITOR) tablet 20 mg  20 mg Oral Daily Patrecia Pour, Christean Grief, MD   20 mg at 06/21/16 1002  . carvedilol (COREG) tablet 6.25 mg  6.25 mg Oral BID WC Patrecia Pour, Christean Grief, MD   6.25 mg  at 06/21/16 0738  . colchicine tablet 0.6 mg  0.6 mg Oral BID Nita Sells, MD   0.6 mg at 06/21/16 1002  . enoxaparin (LOVENOX) injection 50 mg  50 mg Subcutaneous Q12H Nita Sells, MD   50 mg at 06/21/16 1002  . furosemide (LASIX) injection 60 mg  60 mg Intravenous TID Annita Brod, MD   60 mg at 06/21/16 1002  . hydrALAZINE (APRESOLINE) injection 10 mg  10 mg Intravenous Q4H PRN Rise Patience, MD   10 mg at 06/16/16 0640  . insulin aspart (novoLOG) injection 0-20 Units  0-20 Units Subcutaneous TID WC  Patrecia Pour, Christean Grief, MD   4 Units at 06/21/16 1127  . insulin glargine (LANTUS) injection 4 Units  4 Units Subcutaneous QHS Annita Brod, MD   4 Units at 06/20/16 2114  . ipratropium-albuterol (DUONEB) 0.5-2.5 (3) MG/3ML nebulizer solution 3 mL  3 mL Nebulization Q6H PRN Nita Sells, MD   3 mL at 06/20/16 2015  . metFORMIN (GLUCOPHAGE) tablet 1,000 mg  1,000 mg Oral BID WC Patrecia Pour, Christean Grief, MD   1,000 mg at 06/21/16 0739  . multivitamin with minerals tablet 1 tablet  1 tablet Oral Daily Patrecia Pour, Christean Grief, MD   1 tablet at 06/21/16 1002  . ondansetron (ZOFRAN) injection 4 mg  4 mg Intravenous Q6H PRN Patrecia Pour, Christean Grief, MD   4 mg at 06/15/16 1957  . oxyCODONE (Oxy IR/ROXICODONE) immediate release tablet 5 mg  5 mg Oral Q6H PRN Nita Sells, MD   5 mg at 06/20/16 2114  . polyethylene glycol (MIRALAX / GLYCOLAX) packet 17 g  17 g Oral BID PRN Patrecia Pour, Christean Grief, MD   17 g at 06/11/16 1536  . predniSONE (DELTASONE) tablet 40 mg  40 mg Oral QAC breakfast Nita Sells, MD   40 mg at 06/21/16 0738  . protein supplement (PREMIER PROTEIN) liquid  11 oz Oral TID BM Patrecia Pour, Christean Grief, MD   11 oz at 06/21/16 1000  . sodium chloride flush (NS) 0.9 % injection 3 mL  3 mL Intravenous Q12H Patrecia Pour, Christean Grief, MD   3 mL at 06/21/16 1003  . sodium chloride flush (NS) 0.9 % injection 3 mL  3 mL Intravenous PRN Patrecia Pour, Christean Grief, MD         Discharge Medications: Medication List    STOP taking these medications   acetaminophen-codeine 300-60 MG tablet Commonly known as:  TYLENOL #4   atenolol 25 MG tablet Commonly known as:  TENORMIN     TAKE these medications   ACCU-CHEK FASTCLIX LANCETS Misc 1 each by Does not apply route 3 (three) times daily. E11.9   ACCU-CHEK NANO SMARTVIEW w/Device Kit 1 Device by Does not apply route as needed. E 11.9   albuterol (2.5 MG/3ML) 0.083% nebulizer solution Commonly known as:  PROVENTIL Take 3 mLs (2.5 mg total) by  nebulization every 6 (six) hours as needed for wheezing or shortness of breath.   aspirin 81 MG EC tablet Take 1 tablet (81 mg total) by mouth daily. Start taking on:  06/22/2016   atorvastatin 20 MG tablet Commonly known as:  LIPITOR Take 1 tablet (20 mg total) by mouth daily.   carvedilol 6.25 MG tablet Commonly known as:  COREG Take 1 tablet (6.25 mg total) by mouth 2 (two) times daily with a meal.   furosemide 40 MG tablet Commonly known as:  LASIX Take 1.5 tablets (60 mg total) by mouth 2 (two) times daily. Take 1  tab in the morning and 1 tab at 2 pm What changed:  how much to take   glucose blood test strip Commonly known as:  ACCU-CHEK SMARTVIEW 1 each by Other route 3 (three) times daily. E11.9   metFORMIN 1000 MG tablet Commonly known as:  GLUCOPHAGE Take 1 tablet (1,000 mg total) by mouth 2 (two) times daily with a meal.   multivitamin with minerals Tabs tablet Take 1 tablet by mouth daily. Start taking on:  06/22/2016   polyethylene glycol packet Commonly known as:  MIRALAX / GLYCOLAX Take 17 g by mouth 2 (two) times daily as needed for mild constipation. Reported on 08/03/2015   potassium chloride SA 20 MEQ tablet Commonly known as:  K-DUR,KLOR-CON Take 1 tablet (20 mEq total) by mouth daily.     Relevant Imaging Results:  Relevant Lab Results:   Additional Information SS# [id number]  Nila Nephew, LCSW

## 2016-06-21 NOTE — Progress Notes (Signed)
CSW following for disposition/ DC planning. Pt from [facility] and plans to return at DC.   Will continue following to assist with DC.  Sharren Bridge, MSW, LCSW Clinical Social Work 06/21/2016 [phone]

## 2016-06-21 NOTE — Progress Notes (Signed)
Pt to be discharged back to [facility] this afternoon. Report called to facility. VSS and no acute changes noted in Pt's assessment at time of discharge.

## 2016-06-21 NOTE — Discharge Summary (Signed)
Physician Discharge Summary  [name] WUJ:[id number] DOB: 10-10-1965 DOA: 06/10/2016  PCP: Boykin Nearing, MD  Admit date: 06/10/2016 Discharge date: 06/21/2016  Time spent: 45 minutes  Recommendations for Outpatient Follow-up:  -Will be discharged back to ALF today. -Will need close follow up with his PCP in 1 week.   Discharge Diagnoses:  Active Problems:   Severe obesity (BMI >= 40) (HCC)   Type II diabetes mellitus (HCC)   Acute on chronic diastolic CHF (congestive heart failure), NYHA class 1 (HCC)   Essential hypertension   COPD (chronic obstructive pulmonary disease) (HCC)   Tracheostomy dependent (HCC)   SOB (shortness of breath)   Pressure injury of skin   Discharge Condition: Stable and improved  Filed Weights   06/18/16 0644 06/20/16 0633 06/21/16 0544  Weight: (!) 189.1 kg (416 lb 14.4 oz) (!) 190.2 kg (419 lb 6.4 oz) (!) 191.4 kg (422 lb)    History of present illness:  As per Dr. Quincy Simmonds on 5/7: [name] is a 51 y.o. male with medical history significant of hypertension, morbidity obese, diabetes mellitus, with trach, and questionable history of congestive heart failure. Presented to the emergency department complaining of progressive shortness of breath for the past 5 days associated symptoms include decreased urinary frequency and some chest discomfort. Patient denies any recent illness, recent travel, palpitations and dizziness. Patient also have history of COPD but denies wheezing or coughing at the time. Patient also report increasing swelling in his lower extremity and mild redness in both extremities that started about 2 days ago.  ED Course: Patient arrived with mild hypoxia, chest x-ray found to be with vascular congestion IV Lasix were given with good diuresis. Patient was going to be discharged home with cardiology follow-up but remain with hypoxia on room air. Patient is not oxygen dependent so [facility] hospitalist was consulted for  admission.  Hospital Course:   Acute on Chronic Hypoxemic Respiratory Failure -Multifactorial due to Acute on chronic diastolic CHF, OSA, OHS, morbid obesity, COPD. -Has a tracheostomy. -Continue lasix at higher dose of 60 mg BID as well as K supplementation. -Counseled on weight loss. -Has diuresed over 15 L since admission. Still some edema of his legs, but suspect this is chronic due to his body habitus. Lungs are clear. -OK for DC back to ALF today.   Procedures:  None   Consultations:  None  Discharge Instructions  Discharge Instructions    Diet - low sodium heart healthy    Complete by:  As directed    Increase activity slowly    Complete by:  As directed      Allergies as of 06/21/2016      Reactions   Tramadol Anaphylaxis, Swelling   Tongue    Ibuprofen Swelling   Tongue Swelling Bloody Stool    Robaxin [methocarbamol]    Bloody stool      Medication List    STOP taking these medications   acetaminophen-codeine 300-60 MG tablet Commonly known as:  TYLENOL #4   atenolol 25 MG tablet Commonly known as:  TENORMIN     TAKE these medications   ACCU-CHEK FASTCLIX LANCETS Misc 1 each by Does not apply route 3 (three) times daily. E11.9   ACCU-CHEK NANO SMARTVIEW w/Device Kit 1 Device by Does not apply route as needed. E 11.9   albuterol (2.5 MG/3ML) 0.083% nebulizer solution Commonly known as:  PROVENTIL Take 3 mLs (2.5 mg total) by nebulization every 6 (six) hours as needed  for wheezing or shortness of breath.   aspirin 81 MG EC tablet Take 1 tablet (81 mg total) by mouth daily. Start taking on:  06/22/2016   atorvastatin 20 MG tablet Commonly known as:  LIPITOR Take 1 tablet (20 mg total) by mouth daily.   carvedilol 6.25 MG tablet Commonly known as:  COREG Take 1 tablet (6.25 mg total) by mouth 2 (two) times daily with a meal.   furosemide 40 MG tablet Commonly known as:  LASIX Take 1.5 tablets (60 mg total) by mouth 2 (two) times daily.  Take 1 tab in the morning and 1 tab at 2 pm What changed:  how much to take   glucose blood test strip Commonly known as:  ACCU-CHEK SMARTVIEW 1 each by Other route 3 (three) times daily. E11.9   metFORMIN 1000 MG tablet Commonly known as:  GLUCOPHAGE Take 1 tablet (1,000 mg total) by mouth 2 (two) times daily with a meal.   multivitamin with minerals Tabs tablet Take 1 tablet by mouth daily. Start taking on:  06/22/2016   polyethylene glycol packet Commonly known as:  MIRALAX / GLYCOLAX Take 17 g by mouth 2 (two) times daily as needed for mild constipation. Reported on 08/03/2015   potassium chloride SA 20 MEQ tablet Commonly known as:  K-DUR,KLOR-CON Take 1 tablet (20 mEq total) by mouth daily.     ASK your doctor about these medications   sulfamethoxazole-trimethoprim 800-160 MG tablet Commonly known as:  BACTRIM DS,SEPTRA DS Take 1 tablet by mouth 2 (two) times daily. Ask about: Should I take this medication?      Allergies  Allergen Reactions  . Tramadol Anaphylaxis and Swelling    Tongue   . Ibuprofen Swelling    Tongue Swelling Bloody Stool   . Robaxin [Methocarbamol]     Bloody stool    Contact information for follow-up providers    [facility] Follow up in 1 week(s).   Contact information: [place] [id number] [facility] up.   Specialty:  Emergency Medicine Why:  If symptoms worsen Contact information: [place] [id number] [place] [id number] [phone]       Boykin Nearing, MD Follow up in 1 week(s).   Specialty:  Family Medicine Contact information: [place] [address] [phone]            Contact information for after-discharge care    Destination    [facility] Follow up.   Specialty:  Assisted Living  Facility Contact information: [place] [phone]                   The results of significant diagnostics from this hospitalization (including imaging, microbiology, ancillary and laboratory) are listed below for reference.    Significant Diagnostic Studies: Ct Foot Left Wo Contrast  Result Date: 06/18/2016 CLINICAL DATA:  Lower extremity soft tissue swelling. Pain around the left great toe. No tenderness. EXAM: CT OF THE LEFT FOOT WITHOUT CONTRAST TECHNIQUE: Multidetector CT imaging of the left foot was performed according to the standard protocol. Multiplanar CT image reconstructions were also generated. COMPARISON:  None. FINDINGS: Bones/Joint/Cartilage No acute fracture or dislocation. Small bony fragment adjacent to the medial malleolus likely reflecting sequela of prior avulsive injury. Normal alignment. No joint effusion. Mild osteoarthritis of the tibiotalar joint. Mild osteoarthritis  of the posterior subtalar joint. Mild osteoarthritis of the navicular- medial cuneiform joint. Moderate osteoarthritis of the first MTP joint. Small plantar calcaneal spur. Enthesopathic changes of the Achilles tendon insertion. Ligaments Ligaments are suboptimally evaluated by CT. Muscles and Tendons Muscles are normal. No muscle atrophy. Flexor, extensor, peroneal and Achilles tendons are grossly intact. Soft tissue No fluid collection or hematoma. No soft tissue mass. Generalized soft tissue edema and skin thickening of the distal left lower leg extending into the ankle and foot. IMPRESSION: 1.  No acute osseous injury of the left foot. 2. Generalized soft tissue edema and skin thickening of the distal left lower leg extending into the ankle and foot likely related to fluid overload chest history of CHF versus less likely cellulitis. 3. Moderate osteoarthritis of the first MTP joint. Electronically Signed   By: Kathreen Devoid   On: 06/18/2016 11:50   Dg Chest  Port 1 View  Result Date: 06/10/2016 CLINICAL DATA:  Hypoxia, dyspnea EXAM: PORTABLE CHEST 1 VIEW COMPARISON:  01/26/2015 chest radiograph. FINDINGS: Tracheostomy tube tip overlies the tracheal air column just below the thoracic inlet. Stable cardiomediastinal silhouette with mild cardiomegaly. No pneumothorax. No pleural effusion. Mild-to-moderate pulmonary edema. IMPRESSION: Mild-to-moderate congestive heart failure. Electronically Signed   By: Ilona Sorrel M.D.   On: 06/10/2016 15:27   Dg Foot 2 Views Left  Result Date: 06/15/2016 CLINICAL DATA:  Acute left foot pain.  Initial encounter. EXAM: LEFT FOOT - 2 VIEW COMPARISON:  None. FINDINGS: There is no evidence of acute fracture, subluxation or dislocation. Degenerative changes at the first MTP joint and within the midfoot noted. A small calcaneal spur is present. Soft tissue swelling is present. IMPRESSION: Soft tissue swelling without acute bony abnormality. Degenerative changes as described. Electronically Signed   By: Margarette Canada M.D.   On: 06/15/2016 20:11    Microbiology: No results found for this or any previous visit (from the past 240 hour(s)).   Labs: Basic Metabolic Panel:  Recent Labs Lab 06/15/16 0538 06/16/16 0510 06/17/16 0405 06/19/16 0926 06/20/16 0533  NA 137 134* 134* 134* 135  K 3.7 5.5* 3.8 3.8 3.4*  CL 91* 89* 86* 89* 89*  CO2 35* 29 36* 35* 37*  GLUCOSE 165* 174* 162* 294* 231*  BUN 24* 26* 28* 23* 23*  CREATININE 0.80 1.01 0.84 0.74 0.77  CALCIUM 9.5 9.2 9.2 8.9 9.1   Liver Function Tests:  Recent Labs Lab 06/16/16 0510  AST 120*  ALT 139*  ALKPHOS 78  BILITOT 1.6*  PROT 8.1  ALBUMIN 3.8   No results for input(s): LIPASE, AMYLASE in the last 168 hours. No results for input(s): AMMONIA in the last 168 hours. CBC:  Recent Labs Lab 06/19/16 0926  WBC 14.9*  NEUTROABS 9.7*  HGB 14.5  HCT 41.6  MCV 74.4*  PLT 264   Cardiac Enzymes: No results for input(s): CKTOTAL, CKMB, CKMBINDEX,  TROPONINI in the last 168 hours. BNP: BNP (last 3 results)  Recent Labs  06/10/16 1356  BNP 31.0    ProBNP (last 3 results) No results for input(s): PROBNP in the last 8760 hours.  CBG:  Recent Labs Lab 06/20/16 1118 06/20/16 1705 06/20/16 2122 06/21/16 0727 06/21/16 1124  GLUCAP 204* 191* 179* 165* 200*       Signed:  Lelon Frohlich  [facility] Pager: [phone] 06/21/2016, 12:37 PM

## 2016-06-21 NOTE — Progress Notes (Signed)
LCSW following for discharge planning/disposition:  Pt returning to [facility].   Patient will transport by friend's vehicle (Mr. Adelfa Koh).  Family notified regarding discharge - no family, CSW spoke with pt's church friend Mr Adelfa Koh.  All information sent to facility by [facility] Report number for RN:  [phone], Kristeen Miss will take report.   No other needs at this time   Plan: DC to [facility].  Sharren Bridge, MSW, LCSW Clinical Social Work 06/21/2016 [phone]

## 2016-06-21 NOTE — Progress Notes (Signed)
Pt SATs 88% on RA. Pt is noncompliant with O2. No distress noted at this time.

## 2016-06-24 DIAGNOSIS — K5900 Constipation, unspecified: Secondary | ICD-10-CM | POA: Diagnosis not present

## 2016-06-24 DIAGNOSIS — J449 Chronic obstructive pulmonary disease, unspecified: Secondary | ICD-10-CM | POA: Diagnosis not present

## 2016-06-24 DIAGNOSIS — R609 Edema, unspecified: Secondary | ICD-10-CM | POA: Diagnosis not present

## 2016-06-24 DIAGNOSIS — Z79899 Other long term (current) drug therapy: Secondary | ICD-10-CM | POA: Diagnosis not present

## 2016-06-24 DIAGNOSIS — I10 Essential (primary) hypertension: Secondary | ICD-10-CM | POA: Diagnosis not present

## 2016-06-24 DIAGNOSIS — I509 Heart failure, unspecified: Secondary | ICD-10-CM | POA: Diagnosis not present

## 2016-06-26 DIAGNOSIS — Z930 Tracheostomy status: Secondary | ICD-10-CM | POA: Diagnosis not present

## 2016-06-26 DIAGNOSIS — R239 Unspecified skin changes: Secondary | ICD-10-CM | POA: Diagnosis not present

## 2016-06-26 DIAGNOSIS — Z6841 Body Mass Index (BMI) 40.0 and over, adult: Secondary | ICD-10-CM | POA: Diagnosis not present

## 2016-06-26 DIAGNOSIS — Z9111 Patient's noncompliance with dietary regimen: Secondary | ICD-10-CM | POA: Diagnosis not present

## 2016-06-26 DIAGNOSIS — Z430 Encounter for attention to tracheostomy: Secondary | ICD-10-CM | POA: Diagnosis not present

## 2016-06-26 DIAGNOSIS — I509 Heart failure, unspecified: Secondary | ICD-10-CM | POA: Diagnosis not present

## 2016-06-26 DIAGNOSIS — G4733 Obstructive sleep apnea (adult) (pediatric): Secondary | ICD-10-CM | POA: Diagnosis not present

## 2016-06-26 DIAGNOSIS — Z9114 Patient's other noncompliance with medication regimen: Secondary | ICD-10-CM | POA: Diagnosis not present

## 2016-06-27 ENCOUNTER — Inpatient Hospital Stay (HOSPITAL_COMMUNITY)
Admission: EM | Admit: 2016-06-27 | Discharge: 2016-07-12 | DRG: 291 | Disposition: A | Discharge status: Home Health | Payer: Medicare Other | Attending: Internal Medicine | Admitting: Internal Medicine

## 2016-06-27 ENCOUNTER — Emergency Department (HOSPITAL_COMMUNITY): Payer: Medicare Other

## 2016-06-27 ENCOUNTER — Encounter (HOSPITAL_COMMUNITY): Payer: Self-pay | Admitting: *Deleted

## 2016-06-27 DIAGNOSIS — Z515 Encounter for palliative care: Secondary | ICD-10-CM

## 2016-06-27 DIAGNOSIS — Z7984 Long term (current) use of oral hypoglycemic drugs: Secondary | ICD-10-CM

## 2016-06-27 DIAGNOSIS — E8770 Fluid overload, unspecified: Secondary | ICD-10-CM

## 2016-06-27 DIAGNOSIS — E872 Acidosis: Secondary | ICD-10-CM | POA: Diagnosis present

## 2016-06-27 DIAGNOSIS — Z930 Tracheostomy status: Secondary | ICD-10-CM

## 2016-06-27 DIAGNOSIS — I517 Cardiomegaly: Secondary | ICD-10-CM

## 2016-06-27 DIAGNOSIS — I110 Hypertensive heart disease with heart failure: Principal | ICD-10-CM | POA: Diagnosis present

## 2016-06-27 DIAGNOSIS — G4710 Hypersomnia, unspecified: Secondary | ICD-10-CM | POA: Diagnosis not present

## 2016-06-27 DIAGNOSIS — J9621 Acute and chronic respiratory failure with hypoxia: Secondary | ICD-10-CM | POA: Diagnosis present

## 2016-06-27 DIAGNOSIS — Z91138 Patient's unintentional underdosing of medication regimen for other reason: Secondary | ICD-10-CM

## 2016-06-27 DIAGNOSIS — Z841 Family history of disorders of kidney and ureter: Secondary | ICD-10-CM

## 2016-06-27 DIAGNOSIS — R0602 Shortness of breath: Secondary | ICD-10-CM | POA: Diagnosis not present

## 2016-06-27 DIAGNOSIS — Z886 Allergy status to analgesic agent status: Secondary | ICD-10-CM

## 2016-06-27 DIAGNOSIS — L89154 Pressure ulcer of sacral region, stage 4: Secondary | ICD-10-CM | POA: Diagnosis not present

## 2016-06-27 DIAGNOSIS — J9811 Atelectasis: Secondary | ICD-10-CM | POA: Diagnosis present

## 2016-06-27 DIAGNOSIS — G4733 Obstructive sleep apnea (adult) (pediatric): Secondary | ICD-10-CM

## 2016-06-27 DIAGNOSIS — J9622 Acute and chronic respiratory failure with hypercapnia: Secondary | ICD-10-CM | POA: Diagnosis present

## 2016-06-27 DIAGNOSIS — I5033 Acute on chronic diastolic (congestive) heart failure: Secondary | ICD-10-CM | POA: Diagnosis present

## 2016-06-27 DIAGNOSIS — F17200 Nicotine dependence, unspecified, uncomplicated: Secondary | ICD-10-CM | POA: Diagnosis present

## 2016-06-27 DIAGNOSIS — Z6841 Body Mass Index (BMI) 40.0 and over, adult: Secondary | ICD-10-CM

## 2016-06-27 DIAGNOSIS — I444 Left anterior fascicular block: Secondary | ICD-10-CM | POA: Diagnosis present

## 2016-06-27 DIAGNOSIS — D72829 Elevated white blood cell count, unspecified: Secondary | ICD-10-CM | POA: Diagnosis present

## 2016-06-27 DIAGNOSIS — Z7982 Long term (current) use of aspirin: Secondary | ICD-10-CM

## 2016-06-27 DIAGNOSIS — J449 Chronic obstructive pulmonary disease, unspecified: Secondary | ICD-10-CM | POA: Diagnosis present

## 2016-06-27 DIAGNOSIS — Z659 Problem related to unspecified psychosocial circumstances: Secondary | ICD-10-CM

## 2016-06-27 DIAGNOSIS — Z66 Do not resuscitate: Secondary | ICD-10-CM | POA: Diagnosis present

## 2016-06-27 DIAGNOSIS — E662 Morbid (severe) obesity with alveolar hypoventilation: Secondary | ICD-10-CM | POA: Diagnosis not present

## 2016-06-27 DIAGNOSIS — Z7189 Other specified counseling: Secondary | ICD-10-CM

## 2016-06-27 DIAGNOSIS — Y92099 Unspecified place in other non-institutional residence as the place of occurrence of the external cause: Secondary | ICD-10-CM

## 2016-06-27 DIAGNOSIS — E119 Type 2 diabetes mellitus without complications: Secondary | ICD-10-CM | POA: Diagnosis present

## 2016-06-27 DIAGNOSIS — Z800 Family history of malignant neoplasm of digestive organs: Secondary | ICD-10-CM

## 2016-06-27 DIAGNOSIS — J9601 Acute respiratory failure with hypoxia: Secondary | ICD-10-CM

## 2016-06-27 DIAGNOSIS — E876 Hypokalemia: Secondary | ICD-10-CM | POA: Diagnosis not present

## 2016-06-27 DIAGNOSIS — F17210 Nicotine dependence, cigarettes, uncomplicated: Secondary | ICD-10-CM | POA: Diagnosis present

## 2016-06-27 DIAGNOSIS — L98429 Non-pressure chronic ulcer of back with unspecified severity: Secondary | ICD-10-CM | POA: Diagnosis present

## 2016-06-27 DIAGNOSIS — T501X6A Underdosing of loop [high-ceiling] diuretics, initial encounter: Secondary | ICD-10-CM | POA: Diagnosis present

## 2016-06-27 DIAGNOSIS — Z9119 Patient's noncompliance with other medical treatment and regimen: Secondary | ICD-10-CM

## 2016-06-27 DIAGNOSIS — Z79899 Other long term (current) drug therapy: Secondary | ICD-10-CM

## 2016-06-27 LAB — CBC WITH DIFFERENTIAL/PLATELET
BASOS ABS: 0 10*3/uL (ref 0.0–0.1)
BASOS PCT: 0 %
Eosinophils Absolute: 0.4 10*3/uL (ref 0.0–0.7)
Eosinophils Relative: 2 %
HEMATOCRIT: 39.9 % (ref 39.0–52.0)
HEMOGLOBIN: 13.9 g/dL (ref 13.0–17.0)
LYMPHS PCT: 21 %
Lymphs Abs: 4.2 10*3/uL — ABNORMAL HIGH (ref 0.7–4.0)
MCH: 26.1 pg (ref 26.0–34.0)
MCHC: 34.8 g/dL (ref 30.0–36.0)
MCV: 75 fL — AB (ref 78.0–100.0)
Monocytes Absolute: 1.8 10*3/uL — ABNORMAL HIGH (ref 0.1–1.0)
Monocytes Relative: 9 %
NEUTROS ABS: 14.1 10*3/uL — AB (ref 1.7–7.7)
Neutrophils Relative %: 68 %
Platelets: 338 10*3/uL (ref 150–400)
RBC: 5.32 MIL/uL (ref 4.22–5.81)
RDW: 16.1 % — ABNORMAL HIGH (ref 11.5–15.5)
WBC: 20.6 10*3/uL — AB (ref 4.0–10.5)

## 2016-06-27 LAB — BASIC METABOLIC PANEL
ANION GAP: 9 (ref 5–15)
BUN: 16 mg/dL (ref 6–20)
CALCIUM: 9.1 mg/dL (ref 8.9–10.3)
CHLORIDE: 95 mmol/L — AB (ref 101–111)
CO2: 33 mmol/L — AB (ref 22–32)
Creatinine, Ser: 0.79 mg/dL (ref 0.61–1.24)
GFR calc non Af Amer: >60 mL/min (ref >60)
GLUCOSE: 108 mg/dL — AB (ref 65–99)
Potassium: 3.8 mmol/L (ref 3.5–5.1)
Sodium: 137 mmol/L (ref 135–145)

## 2016-06-27 NOTE — ED Provider Notes (Signed)
[facility] DEPT Provider Note   CSN: [id number] Arrival date & time: 06/27/16  2138   By signing my name below, I, Eunice Blase, attest that this documentation has been prepared under the direction and in the presence of Montine Circle, PA-C . Electronically Signed: Eunice Blase, Scribe. 06/27/16. 11:24 PM.   History   Chief Complaint Chief Complaint  Patient presents with  . Shortness of Breath   The history is provided by the patient and medical records. No language interpreter was used.    [name] is a 51 y.o. male with h/o morbid obesity, CHF, COPD, HTN, DM, Sleep apnea and ventilator dependence, BIB EMS from [place] to the Emergency Department with concern for acute on chronic SOB on exertion, gradually worsening x 1 week and becoming severe this evening when he went to the restroom. He states he becomes severely short of breath after < 10 steps; he also c/o bilateral leg and trunk swelling. Pt [facility] 06/10/2016 and discharged 06/22/2016. Pt's lasix were increased at this time. Pt allegedly refused oxygen en route with EMS; he states he does not want oxygen because it causes Headache. He states that he missed several doses of his Lasix, which he believes contributed to this exacerbation. No supplemental oxygen use noted at home, though he states he was on 6 L of oxygen in 2011-2013. Records indicate the pt is a daily smoker.  Past Medical History:  Diagnosis Date  . CHF (congestive heart failure) [facility]) Dx Oct 2012  . Chronic ulcer of sacral region ([place]) 2012  . COPD (chronic obstructive pulmonary disease) ([place]) Dx 2013  . Diabetes mellitus without complication [facility]) Dx Nov 2015  . Hypertension Dx Oct 2012  . Sleep apnea Dx Oct 2011  . Ventilator dependent [facility])     Patient Active Problem List   Diagnosis Date Noted  . Pressure injury of skin 06/15/2016  . SOB (shortness of breath) 06/10/2016  . Pain of left hip joint 11/21/2015  . Bilateral  chronic knee pain 07/25/2015  . Tracheostomy dependent ([place]) 03/10/2015  . COPD (chronic obstructive pulmonary disease) ([place])   . Essential hypertension 01/20/2015  . DOE (dyspnea on exertion) 01/20/2015  . Toenail fungus 07/12/2014  . Acute on chronic diastolic CHF (congestive heart failure), NYHA class 1 ([place]) 06/15/2014  . Hyperlipidemia associated with type 2 diabetes mellitus ([place]) 05/05/2014  . Cardiomegaly 05/02/2014  . Nasal congestion 05/02/2014  . Current smoker 05/02/2014  . Severe obesity (BMI >= 40) ([place]) 11/28/2013  . Type II diabetes mellitus ([place]) 11/28/2013  . Left anterior fascicular block 11/27/2013  . Poor social situation 11/27/2013  . Monoclonal gammopathy 12/14/2010  . Decubitus ulcer of sacral area 12/10/2010  . Obesity hypoventilation syndrome ([place]) 12/09/2010    Past Surgical History:  Procedure Laterality Date  . TRACHEOSTOMY TUBE PLACEMENT    . WOUND DEBRIDEMENT  12/21/2010   Procedure: DEBRIDEMENT WOUND;  Surgeon: Harl Bowie, MD;  Location: [facility];  Service: General;  Laterality: N/A;  Sacral       Home Medications    Prior to Admission medications   Medication Sig Start Date End Date Taking? Authorizing Provider  ACCU-CHEK FASTCLIX LANCETS MISC 1 each by Does not apply route 3 (three) times daily. E11.9 05/02/14   Funches, Adriana Mccallum, MD  albuterol (PROVENTIL) (2.5 MG/3ML) 0.083% nebulizer solution Take 3 mLs (2.5 mg total) by nebulization every 6 (six) hours as needed for wheezing or shortness of breath. 03/10/15  Boykin Nearing, MD  aspirin EC 81 MG EC tablet Take 1 tablet (81 mg total) by mouth daily. 06/22/16   Isaac Bliss, Rayford Halsted, MD  atorvastatin (LIPITOR) 20 MG tablet Take 1 tablet (20 mg total) by mouth daily. 05/05/14   Funches, Adriana Mccallum, MD  Blood Glucose Monitoring Suppl (ACCU-CHEK NANO SMARTVIEW) W/DEVICE KIT 1 Device by Does not apply route as needed. E 11.9 05/02/14   Funches, Josalyn, MD  carvedilol (COREG) 6.25 MG tablet Take  1 tablet (6.25 mg total) by mouth 2 (two) times daily with a meal. 06/21/16   Isaac Bliss, Rayford Halsted, MD  furosemide (LASIX) 40 MG tablet Take 1.5 tablets (60 mg total) by mouth 2 (two) times daily. Take 1 tab in the morning and 1 tab at 2 pm 06/21/16   Isaac Bliss, Rayford Halsted, MD  glucose blood (ACCU-CHEK SMARTVIEW) test strip 1 each by Other route 3 (three) times daily. E11.9 05/02/14   Boykin Nearing, MD  Multiple Vitamin (MULTIVITAMIN WITH MINERALS) TABS tablet Take 1 tablet by mouth daily. 06/22/16   Isaac Bliss, Rayford Halsted, MD  polyethylene glycol [facility]) packet Take 17 g by mouth 2 (two) times daily as needed for mild constipation. Reported on 08/03/2015    [provider]  potassium chloride SA (K-DUR,KLOR-CON) 20 MEQ tablet Take 1 tablet (20 mEq total) by mouth daily. 08/04/14   Dorothy Spark, MD  sitaGLIPtin (JANUVIA) 100 MG tablet Take 1 tablet (100 mg total) by mouth daily. 06/21/16   Isaac Bliss, Rayford Halsted, MD    Family History Family History  Problem Relation Age of Onset  . Cancer Brother   . Chronic Renal Failure Sister     Social History Social History  Substance Use Topics  . Smoking status: Current Every Day Smoker    Types: Cigarettes  . Smokeless tobacco: Never Used  . Alcohol use No     Allergies   Tramadol; Ibuprofen; and Robaxin [methocarbamol]   Review of Systems Review of Systems  Respiratory: Positive for shortness of breath and wheezing.   Cardiovascular: Positive for leg swelling.  All other systems reviewed and are negative.    Physical Exam Updated Vital Signs BP 93/64 (BP Location: Right Arm)   Pulse 85   Temp 99.2 F (37.3 C) (Oral)   Resp 20   SpO2 (!) 81%   Physical Exam  Constitutional: He is oriented to person, place, and time. He appears well-developed and well-nourished.  HENT:  Head: Normocephalic and atraumatic.  Eyes: EOM are normal.  Neck: Normal range of motion.  Cardiovascular: Normal  rate, regular rhythm, normal heart sounds and intact distal pulses.   Pulmonary/Chest:  Increased respiratory effort with accessory muscle use  Abdominal: Soft. He exhibits no distension. There is no tenderness.  Musculoskeletal: Normal range of motion.  3+ pitting edema in bilateral lower extremities  Neurological: He is alert and oriented to person, place, and time.  Skin: Skin is warm and dry.  Psychiatric: He has a normal mood and affect. Judgment normal.  Nursing note and vitals reviewed.    ED Treatments / Results  DIAGNOSTIC STUDIES: Oxygen Saturation is 81% on RA, Low by my interpretation.    COORDINATION OF CARE: 11:20 PM-Discussed next steps with pt. Pt verbalized understanding and is agreeable with the plan. Will order medication and reassess.   Labs (all labs ordered are listed, but only abnormal results are displayed) Labs Reviewed  CBC WITH DIFFERENTIAL/PLATELET - Abnormal; Notable for the following:  Result Value   WBC 20.6 (*)    MCV 75.0 (*)    RDW 16.1 (*)    Neutro Abs 14.1 (*)    Lymphs Abs 4.2 (*)    Monocytes Absolute 1.8 (*)    All other components within normal limits  BASIC METABOLIC PANEL - Abnormal; Notable for the following:    Chloride 95 (*)    CO2 33 (*)    Glucose, Bld 108 (*)    All other components within normal limits  BRAIN NATRIURETIC PEPTIDE  TROPONIN I  MAGNESIUM  PHOSPHORUS    EKG  EKG Interpretation None       Radiology Dg Chest Port 1 View  Result Date: 06/27/2016 CLINICAL DATA:  Shortness of breath for 1 week EXAM: PORTABLE CHEST 1 VIEW COMPARISON:  06/10/2016 FINDINGS: Cardiomegaly with central vascular congestion and mild diffuse pulmonary edema. Atelectasis at the left base. No large effusion. IMPRESSION: Cardiomegaly with central vascular congestion and mild diffuse pulmonary edema Electronically Signed   By: Donavan Foil M.D.   On: 06/27/2016 23:00    Procedures Procedures (including critical care  time)  Medications Ordered in ED Medications - No data to display   Initial Impression / Assessment and Plan / ED Course  I have reviewed the triage vital signs and the nursing notes.  Pertinent labs & imaging results that were available during my care of the patient were reviewed by me and considered in my medical decision making (see chart for details).     Patient with shortness breath. Patient is tracheostomy dependent. She is morbidly obese. He is volume overloaded clinically, and has missed his Lasix. He denies any fevers chills. He has adamantly refused oxygen. I discussed the risks of this with him.  Patient seen by and discussed with Dr. Betsey Holiday, who agrees with plan for admission.  Appreciate Dr. Olevia Bowens for admitting the patient. Cr normal. BNP normal, but clinical picture and CXR are consistent with fluid overload. Patient given 60 mg of Lasix.   Final Clinical Impressions(s) / ED Diagnoses   Final diagnoses:  SOB (shortness of breath)    New Prescriptions New Prescriptions   No medications on file  I personally performed the services described in this documentation, which was scribed in my presence. The recorded information has been reviewed and is accurate.      Montine Circle, PA-C 06/28/16 0120    Orpah Greek, MD 06/28/16 [phone]

## 2016-06-27 NOTE — ED Notes (Signed)
Bed: XN23 Expected date:  Expected time:  Means of arrival:  Comments: 51 yo M/shortness of breath-trach

## 2016-06-27 NOTE — ED Triage Notes (Signed)
Per EMS, pt from [facility] complains of shortness of breath x 1 week, worse since yesterday. SOB worse with exertion. Pt's lasix was increased last week. Pt denies chest pain. Pt was 85-92% O2 en route to hospital. Pt refused O2 en route.   BP 132/90 HR 88

## 2016-06-27 NOTE — ED Notes (Signed)
Pt is refusing supplemental oxygen

## 2016-06-27 NOTE — Progress Notes (Signed)
Patient just came to the ED. From previous visit he refused oxygen. This visit he refuses oxygen again even though his sats are in the low 80's. RT placed humidification on medical air for humidification for hie trach. RT will continue to monitor.

## 2016-06-27 NOTE — ED Notes (Signed)
EKG given to EDP,Little,MD., for review. 

## 2016-06-28 ENCOUNTER — Encounter (HOSPITAL_COMMUNITY): Payer: Self-pay | Admitting: Internal Medicine

## 2016-06-28 DIAGNOSIS — I10 Essential (primary) hypertension: Secondary | ICD-10-CM | POA: Diagnosis not present

## 2016-06-28 DIAGNOSIS — E8770 Fluid overload, unspecified: Secondary | ICD-10-CM | POA: Diagnosis not present

## 2016-06-28 DIAGNOSIS — E119 Type 2 diabetes mellitus without complications: Secondary | ICD-10-CM | POA: Diagnosis not present

## 2016-06-28 DIAGNOSIS — R0602 Shortness of breath: Secondary | ICD-10-CM | POA: Diagnosis not present

## 2016-06-28 DIAGNOSIS — D72829 Elevated white blood cell count, unspecified: Secondary | ICD-10-CM | POA: Diagnosis present

## 2016-06-28 DIAGNOSIS — E1165 Type 2 diabetes mellitus with hyperglycemia: Secondary | ICD-10-CM | POA: Diagnosis not present

## 2016-06-28 DIAGNOSIS — F17200 Nicotine dependence, unspecified, uncomplicated: Secondary | ICD-10-CM

## 2016-06-28 DIAGNOSIS — J9811 Atelectasis: Secondary | ICD-10-CM | POA: Diagnosis present

## 2016-06-28 DIAGNOSIS — L98429 Non-pressure chronic ulcer of back with unspecified severity: Secondary | ICD-10-CM

## 2016-06-28 DIAGNOSIS — I444 Left anterior fascicular block: Secondary | ICD-10-CM | POA: Diagnosis present

## 2016-06-28 DIAGNOSIS — Z9119 Patient's noncompliance with other medical treatment and regimen: Secondary | ICD-10-CM | POA: Diagnosis not present

## 2016-06-28 DIAGNOSIS — Z6841 Body Mass Index (BMI) 40.0 and over, adult: Secondary | ICD-10-CM | POA: Diagnosis not present

## 2016-06-28 DIAGNOSIS — Z930 Tracheostomy status: Secondary | ICD-10-CM | POA: Diagnosis not present

## 2016-06-28 DIAGNOSIS — B351 Tinea unguium: Secondary | ICD-10-CM | POA: Diagnosis not present

## 2016-06-28 DIAGNOSIS — R262 Difficulty in walking, not elsewhere classified: Secondary | ICD-10-CM | POA: Diagnosis not present

## 2016-06-28 DIAGNOSIS — E662 Morbid (severe) obesity with alveolar hypoventilation: Secondary | ICD-10-CM | POA: Diagnosis not present

## 2016-06-28 DIAGNOSIS — R0609 Other forms of dyspnea: Secondary | ICD-10-CM | POA: Diagnosis not present

## 2016-06-28 DIAGNOSIS — J9601 Acute respiratory failure with hypoxia: Secondary | ICD-10-CM | POA: Diagnosis not present

## 2016-06-28 DIAGNOSIS — J438 Other emphysema: Secondary | ICD-10-CM | POA: Diagnosis not present

## 2016-06-28 DIAGNOSIS — F17210 Nicotine dependence, cigarettes, uncomplicated: Secondary | ICD-10-CM | POA: Diagnosis present

## 2016-06-28 DIAGNOSIS — Z515 Encounter for palliative care: Secondary | ICD-10-CM | POA: Diagnosis not present

## 2016-06-28 DIAGNOSIS — E118 Type 2 diabetes mellitus with unspecified complications: Secondary | ICD-10-CM

## 2016-06-28 DIAGNOSIS — Z659 Problem related to unspecified psychosocial circumstances: Secondary | ICD-10-CM | POA: Diagnosis not present

## 2016-06-28 DIAGNOSIS — Z7982 Long term (current) use of aspirin: Secondary | ICD-10-CM | POA: Diagnosis not present

## 2016-06-28 DIAGNOSIS — E785 Hyperlipidemia, unspecified: Secondary | ICD-10-CM | POA: Diagnosis not present

## 2016-06-28 DIAGNOSIS — Z794 Long term (current) use of insulin: Secondary | ICD-10-CM | POA: Diagnosis not present

## 2016-06-28 DIAGNOSIS — M6281 Muscle weakness (generalized): Secondary | ICD-10-CM | POA: Diagnosis not present

## 2016-06-28 DIAGNOSIS — J449 Chronic obstructive pulmonary disease, unspecified: Secondary | ICD-10-CM

## 2016-06-28 DIAGNOSIS — G4710 Hypersomnia, unspecified: Secondary | ICD-10-CM | POA: Diagnosis not present

## 2016-06-28 DIAGNOSIS — E872 Acidosis: Secondary | ICD-10-CM | POA: Diagnosis present

## 2016-06-28 DIAGNOSIS — D472 Monoclonal gammopathy: Secondary | ICD-10-CM | POA: Diagnosis not present

## 2016-06-28 DIAGNOSIS — I110 Hypertensive heart disease with heart failure: Secondary | ICD-10-CM | POA: Diagnosis present

## 2016-06-28 DIAGNOSIS — Y92099 Unspecified place in other non-institutional residence as the place of occurrence of the external cause: Secondary | ICD-10-CM | POA: Diagnosis not present

## 2016-06-28 DIAGNOSIS — T501X6A Underdosing of loop [high-ceiling] diuretics, initial encounter: Secondary | ICD-10-CM | POA: Diagnosis present

## 2016-06-28 DIAGNOSIS — I5033 Acute on chronic diastolic (congestive) heart failure: Secondary | ICD-10-CM | POA: Diagnosis not present

## 2016-06-28 DIAGNOSIS — Z841 Family history of disorders of kidney and ureter: Secondary | ICD-10-CM | POA: Diagnosis not present

## 2016-06-28 DIAGNOSIS — J9621 Acute and chronic respiratory failure with hypoxia: Secondary | ICD-10-CM | POA: Diagnosis not present

## 2016-06-28 DIAGNOSIS — Z7189 Other specified counseling: Secondary | ICD-10-CM | POA: Diagnosis not present

## 2016-06-28 DIAGNOSIS — G4733 Obstructive sleep apnea (adult) (pediatric): Secondary | ICD-10-CM | POA: Diagnosis not present

## 2016-06-28 DIAGNOSIS — I517 Cardiomegaly: Secondary | ICD-10-CM | POA: Diagnosis not present

## 2016-06-28 DIAGNOSIS — R2681 Unsteadiness on feet: Secondary | ICD-10-CM | POA: Diagnosis not present

## 2016-06-28 DIAGNOSIS — J90 Pleural effusion, not elsewhere classified: Secondary | ICD-10-CM | POA: Diagnosis not present

## 2016-06-28 DIAGNOSIS — Z91138 Patient's unintentional underdosing of medication regimen for other reason: Secondary | ICD-10-CM | POA: Diagnosis not present

## 2016-06-28 DIAGNOSIS — J9622 Acute and chronic respiratory failure with hypercapnia: Secondary | ICD-10-CM | POA: Diagnosis not present

## 2016-06-28 DIAGNOSIS — L89154 Pressure ulcer of sacral region, stage 4: Secondary | ICD-10-CM | POA: Diagnosis present

## 2016-06-28 DIAGNOSIS — E876 Hypokalemia: Secondary | ICD-10-CM | POA: Diagnosis not present

## 2016-06-28 DIAGNOSIS — R0600 Dyspnea, unspecified: Secondary | ICD-10-CM | POA: Diagnosis not present

## 2016-06-28 DIAGNOSIS — Z7984 Long term (current) use of oral hypoglycemic drugs: Secondary | ICD-10-CM | POA: Diagnosis not present

## 2016-06-28 DIAGNOSIS — Z800 Family history of malignant neoplasm of digestive organs: Secondary | ICD-10-CM | POA: Diagnosis not present

## 2016-06-28 LAB — CBC WITH DIFFERENTIAL/PLATELET
BASOS ABS: 0 10*3/uL (ref 0.0–0.1)
Basophils Relative: 0 %
EOS PCT: 2 %
Eosinophils Absolute: 0.3 10*3/uL (ref 0.0–0.7)
HEMATOCRIT: 40.3 % (ref 39.0–52.0)
HEMOGLOBIN: 13.8 g/dL (ref 13.0–17.0)
LYMPHS PCT: 25 %
Lymphs Abs: 4.3 10*3/uL — ABNORMAL HIGH (ref 0.7–4.0)
MCH: 25.9 pg — ABNORMAL LOW (ref 26.0–34.0)
MCHC: 34.2 g/dL (ref 30.0–36.0)
MCV: 75.6 fL — AB (ref 78.0–100.0)
Monocytes Absolute: 1.6 10*3/uL — ABNORMAL HIGH (ref 0.1–1.0)
Monocytes Relative: 9 %
NEUTROS ABS: 11.4 10*3/uL — AB (ref 1.7–7.7)
Neutrophils Relative %: 64 %
PLATELETS: 311 10*3/uL (ref 150–400)
RBC: 5.33 MIL/uL (ref 4.22–5.81)
RDW: 16 % — ABNORMAL HIGH (ref 11.5–15.5)
WBC: 17.7 10*3/uL — AB (ref 4.0–10.5)

## 2016-06-28 LAB — COMPREHENSIVE METABOLIC PANEL
ALT: 32 U/L (ref 17–63)
ANION GAP: 8 (ref 5–15)
AST: 19 U/L (ref 15–41)
Albumin: 3.6 g/dL (ref 3.5–5.0)
Alkaline Phosphatase: 70 U/L (ref 38–126)
BILIRUBIN TOTAL: 1.3 mg/dL — AB (ref 0.3–1.2)
BUN: 15 mg/dL (ref 6–20)
CO2: 37 mmol/L — ABNORMAL HIGH (ref 22–32)
Calcium: 8.8 mg/dL — ABNORMAL LOW (ref 8.9–10.3)
Chloride: 94 mmol/L — ABNORMAL LOW (ref 101–111)
Creatinine, Ser: 0.73 mg/dL (ref 0.61–1.24)
Glucose, Bld: 172 mg/dL — ABNORMAL HIGH (ref 65–99)
Potassium: 4 mmol/L (ref 3.5–5.1)
Sodium: 139 mmol/L (ref 135–145)
TOTAL PROTEIN: 7.9 g/dL (ref 6.5–8.1)

## 2016-06-28 LAB — MAGNESIUM
MAGNESIUM: 1.7 mg/dL (ref 1.7–2.4)
Magnesium: 2 mg/dL (ref 1.7–2.4)

## 2016-06-28 LAB — GLUCOSE, CAPILLARY
Glucose-Capillary: 154 mg/dL — ABNORMAL HIGH (ref 65–99)
Glucose-Capillary: 178 mg/dL — ABNORMAL HIGH (ref 65–99)
Glucose-Capillary: 144 mg/dL — ABNORMAL HIGH (ref 65–99)
Glucose-Capillary: 138 mg/dL — ABNORMAL HIGH (ref 65–99)

## 2016-06-28 LAB — PHOSPHORUS
Phosphorus: 4.4 mg/dL (ref 2.5–4.6)
Phosphorus: 4.7 mg/dL — ABNORMAL HIGH (ref 2.5–4.6)

## 2016-06-28 LAB — TROPONIN I: Troponin I: <0.03 ng/mL (ref <0.03)

## 2016-06-28 LAB — BRAIN NATRIURETIC PEPTIDE: B Natriuretic Peptide: 33.5 pg/mL (ref 0.0–100.0)

## 2016-06-28 MED ORDER — CHLORHEXIDINE GLUCONATE 0.12 % MT SOLN
15.0000 mL | Freq: Two times a day (BID) | OROMUCOSAL | Status: DC
  Administered 2016-06-29 – 2016-07-11 (×23): 15 mL via OROMUCOSAL
  Filled 2016-06-28 (×22): qty 15

## 2016-06-28 MED ORDER — POTASSIUM CHLORIDE CRYS ER 20 MEQ PO TBCR
20.0000 meq | EXTENDED_RELEASE_TABLET | Freq: Once | ORAL | Status: AC
  Administered 2016-06-28: 20 meq via ORAL
  Filled 2016-06-28: qty 1

## 2016-06-28 MED ORDER — IPRATROPIUM-ALBUTEROL 0.5-2.5 (3) MG/3ML IN SOLN
3.0000 mL | Freq: Four times a day (QID) | RESPIRATORY_TRACT | Status: DC
  Administered 2016-06-28 (×2): 3 mL via RESPIRATORY_TRACT
  Filled 2016-06-28 (×2): qty 3

## 2016-06-28 MED ORDER — LINAGLIPTIN 5 MG PO TABS
5.0000 mg | ORAL_TABLET | Freq: Every day | ORAL | Status: DC
  Administered 2016-06-28 – 2016-07-03 (×6): 5 mg via ORAL
  Filled 2016-06-28 (×5): qty 1

## 2016-06-28 MED ORDER — ACETAMINOPHEN 325 MG PO TABS
650.0000 mg | ORAL_TABLET | Freq: Once | ORAL | Status: AC
  Administered 2016-06-28: 650 mg via ORAL
  Filled 2016-06-28: qty 2

## 2016-06-28 MED ORDER — IPRATROPIUM-ALBUTEROL 0.5-2.5 (3) MG/3ML IN SOLN
3.0000 mL | Freq: Three times a day (TID) | RESPIRATORY_TRACT | Status: DC
  Administered 2016-06-28 – 2016-06-29 (×4): 3 mL via RESPIRATORY_TRACT
  Filled 2016-06-28 (×5): qty 3

## 2016-06-28 MED ORDER — ASPIRIN EC 81 MG PO TBEC
81.0000 mg | DELAYED_RELEASE_TABLET | Freq: Every day | ORAL | Status: DC
  Administered 2016-06-28 – 2016-07-12 (×14): 81 mg via ORAL
  Filled 2016-06-28 (×15): qty 1

## 2016-06-28 MED ORDER — MAGNESIUM SULFATE 2 GM/50ML IV SOLN
2.0000 g | Freq: Once | INTRAVENOUS | Status: AC
  Administered 2016-06-28: 2 g via INTRAVENOUS
  Filled 2016-06-28: qty 50

## 2016-06-28 MED ORDER — ADULT MULTIVITAMIN W/MINERALS CH
1.0000 | ORAL_TABLET | Freq: Every day | ORAL | Status: DC
  Administered 2016-06-28 – 2016-07-12 (×14): 1 via ORAL
  Filled 2016-06-28 (×15): qty 1

## 2016-06-28 MED ORDER — ALBUTEROL SULFATE (2.5 MG/3ML) 0.083% IN NEBU
2.5000 mg | INHALATION_SOLUTION | RESPIRATORY_TRACT | Status: DC | PRN
  Administered 2016-06-30 – 2016-07-11 (×5): 2.5 mg via RESPIRATORY_TRACT
  Filled 2016-06-28 (×6): qty 3

## 2016-06-28 MED ORDER — FUROSEMIDE 10 MG/ML IJ SOLN
40.0000 mg | Freq: Two times a day (BID) | INTRAMUSCULAR | Status: DC
  Administered 2016-06-28 – 2016-06-29 (×2): 40 mg via INTRAVENOUS
  Filled 2016-06-28 (×2): qty 4

## 2016-06-28 MED ORDER — DEXTROSE 5 % IV SOLN
2.0000 g | Freq: Every day | INTRAVENOUS | Status: DC
  Administered 2016-06-28 – 2016-07-02 (×5): 2 g via INTRAVENOUS
  Filled 2016-06-28 (×5): qty 2

## 2016-06-28 MED ORDER — POTASSIUM CHLORIDE CRYS ER 20 MEQ PO TBCR
20.0000 meq | EXTENDED_RELEASE_TABLET | Freq: Every day | ORAL | Status: DC
  Administered 2016-06-28 – 2016-07-10 (×11): 20 meq via ORAL
  Filled 2016-06-28 (×12): qty 1

## 2016-06-28 MED ORDER — FUROSEMIDE 10 MG/ML IJ SOLN
60.0000 mg | INTRAMUSCULAR | Status: AC
  Administered 2016-06-28: 60 mg via INTRAVENOUS
  Filled 2016-06-28: qty 8

## 2016-06-28 MED ORDER — POLYETHYLENE GLYCOL 3350 17 G PO PACK
17.0000 g | PACK | Freq: Two times a day (BID) | ORAL | Status: DC | PRN
  Administered 2016-06-30 – 2016-07-09 (×3): 17 g via ORAL
  Filled 2016-06-28 (×3): qty 1

## 2016-06-28 MED ORDER — ORAL CARE MOUTH RINSE
15.0000 mL | Freq: Two times a day (BID) | OROMUCOSAL | Status: DC
  Administered 2016-06-30 – 2016-07-12 (×15): 15 mL via OROMUCOSAL

## 2016-06-28 MED ORDER — ENOXAPARIN SODIUM 40 MG/0.4ML ~~LOC~~ SOLN: 40.0000 mg | SUBCUTANEOUS | Status: DC

## 2016-06-28 MED ORDER — ACETAMINOPHEN 325 MG PO TABS
650.0000 mg | ORAL_TABLET | Freq: Four times a day (QID) | ORAL | Status: DC | PRN
  Administered 2016-06-28 – 2016-07-12 (×23): 650 mg via ORAL
  Filled 2016-06-28 (×24): qty 2

## 2016-06-28 MED ORDER — INSULIN ASPART 100 UNIT/ML ~~LOC~~ SOLN
0.0000 [IU] | Freq: Three times a day (TID) | SUBCUTANEOUS | Status: DC
  Administered 2016-06-28: 4 [IU] via SUBCUTANEOUS
  Administered 2016-06-28: 3 [IU] via SUBCUTANEOUS
  Administered 2016-06-29 – 2016-06-30 (×4): 4 [IU] via SUBCUTANEOUS
  Administered 2016-07-01 (×2): 7 [IU] via SUBCUTANEOUS
  Administered 2016-07-02: 3 [IU] via SUBCUTANEOUS
  Administered 2016-07-02: 4 [IU] via SUBCUTANEOUS
  Administered 2016-07-02 – 2016-07-03 (×2): 3 [IU] via SUBCUTANEOUS
  Administered 2016-07-03: 7 [IU] via SUBCUTANEOUS
  Administered 2016-07-03: 3 [IU] via SUBCUTANEOUS
  Administered 2016-07-04 (×2): 4 [IU] via SUBCUTANEOUS
  Administered 2016-07-05 – 2016-07-06 (×2): 7 [IU] via SUBCUTANEOUS
  Administered 2016-07-06 – 2016-07-07 (×2): 4 [IU] via SUBCUTANEOUS
  Administered 2016-07-07 – 2016-07-08 (×3): 3 [IU] via SUBCUTANEOUS
  Administered 2016-07-08: 7 [IU] via SUBCUTANEOUS
  Administered 2016-07-08 – 2016-07-09 (×3): 4 [IU] via SUBCUTANEOUS
  Administered 2016-07-10: 3 [IU] via SUBCUTANEOUS
  Administered 2016-07-10: 4 [IU] via SUBCUTANEOUS
  Administered 2016-07-10: 7 [IU] via SUBCUTANEOUS
  Administered 2016-07-11: 4 [IU] via SUBCUTANEOUS
  Administered 2016-07-11: 7 [IU] via SUBCUTANEOUS
  Administered 2016-07-12: 4 [IU] via SUBCUTANEOUS
  Administered 2016-07-12: 11 [IU] via SUBCUTANEOUS

## 2016-06-28 MED ORDER — DEXTROSE 5 % IV SOLN: 2.0000 g | INTRAVENOUS | Status: DC

## 2016-06-28 MED ORDER — ATORVASTATIN CALCIUM 20 MG PO TABS
20.0000 mg | ORAL_TABLET | Freq: Every day | ORAL | Status: DC
  Administered 2016-06-28 – 2016-07-12 (×14): 20 mg via ORAL
  Filled 2016-06-28 (×3): qty 1
  Filled 2016-06-28: qty 2
  Filled 2016-06-28 (×2): qty 1
  Filled 2016-06-28 (×3): qty 2
  Filled 2016-06-28 (×2): qty 1
  Filled 2016-06-28: qty 2
  Filled 2016-06-28 (×3): qty 1

## 2016-06-28 MED ORDER — ENOXAPARIN SODIUM 100 MG/ML ~~LOC~~ SOLN
90.0000 mg | SUBCUTANEOUS | Status: DC
  Administered 2016-06-28 – 2016-07-12 (×15): 90 mg via SUBCUTANEOUS
  Filled 2016-06-28: qty 1
  Filled 2016-06-28: qty 0.9
  Filled 2016-06-28 (×2): qty 1
  Filled 2016-06-28: qty 0.9
  Filled 2016-06-28: qty 1
  Filled 2016-06-28 (×2): qty 0.9
  Filled 2016-06-28 (×6): qty 1
  Filled 2016-06-28: qty 0.9

## 2016-06-28 NOTE — Progress Notes (Signed)
Patient ID: [name], male   DOB: 12/05/1965, 51 y.o.   MRN: [id number] The patient is refusing oxygen supplementation and does not want to be placed in the stepdown area. He agrees to a floor telemetry bed. I advised him that this is not in his best interest for his health, but the patient was adamant about those requests and I was unable to persuade him otherwise.  Tennis Must, MD

## 2016-06-28 NOTE — Progress Notes (Addendum)
CSW consulted as pt is from facility- [facility]. Pt known to CSW from recent admission. Met with pt at bedside- states "things have been good" at [facility] and plans to return at DC.  Gave permission for CSW to speak with [place] called and spoke with Varney Biles confirming pt's plan to return.   Plan: return [place] at [place]. Will need updated FL2.   Please see full assessment below, completed during most recent admission. No change in psychosocial factors.  Sharren Bridge, MSW, LCSW Clinical Social Work 06/28/2016 [phone]  Clinical Social Work Assessment  Patient Details  Name: [name] MRN: [id number] Date of Birth: 1965/03/11  Date of referral:  06/11/16               Reason for consult:  Discharge Planning                           Permission sought to share information with:  Chartered certified accountant granted to share information::  Yes, Verbal Permission Granted             Name::     [facility]             Agency::                Relationship::                Contact Information:     Housing/Transportation Living arrangements for the past 2 months:  [place] of Information:  Patient, Facility Patient Interpreter Needed:  None Criminal Activity/Legal Involvement Pertinent to Current Situation/Hospitalization:  No - Comment as needed Significant Relationships:  Church, [employer] Lives with:  Facility Resident Do you feel safe going back to the place where you live?  Yes Need for family participation in patient care:  No (Coment)  Care giving concerns:  Pt from [place]. Has lived there 7 weeks, prior to that lived independently at home. States he plans to return to [place] at DC, no issues there. Informs CSW he hopes to "eventually be able to live on my own again." No family involvement but reports close/supportive church family. At baseline ambulates independently  (?with cane) and staff at [facility] assist him with bathing and preparing meals.  Pt initially reluctant to have CSW contact ALF, however CSW informed him that communication would be needed in order to facilitate dc. Pt agreed that needed information could be shared.  Alma Downs at [facility] was provided with CSW contact information and reports pt can return when ready.    Social Worker assessment / plan: CSW consulted due to pt from ALF. Pt plans to return to [facility] when ready for dc. Facility aware of plan.  Will need updated FL2 once ready for DC.  Plan: DC to [facility] when ready, CSW will follow to assist.   Employment status:  Disabled (Comment on whether or not currently receiving Disability) Insurance information:  Medicare, Medicaid In [place] PT Recommendations:  Not assessed at this time Information / Referral to community resources:     Patient/Family's Response to care:  Pt appreciative of care.   Patient/Family's Understanding of and Emotional Response to Diagnosis, Current Treatment, and Prognosis:  Pt demonstrates adequate understanding of plan. States he is glad to "already have a place to go back to."  Emotional Assessment Appearance:  Appears stated age Attitude/Demeanor/Rapport:   (  pleasant) Affect (typically observed):  Calm Orientation:  Oriented to Self, Oriented to Place, Oriented to  Time, Oriented to Situation Alcohol / Substance use:  Not Applicable Psych involvement (Current and /or in the community):  No (Comment)  Discharge Needs  Concerns to be addressed:  No discharge needs identified Readmission within the last 30 days:  No Current discharge risk:  None Barriers to Discharge:  Continued Medical Work up   [employer], LCSW 06/11/2016, 12:30 PM  [phone]

## 2016-06-28 NOTE — Progress Notes (Signed)
Patient 02 sats to be maintained at 92% and above per Dr. Alfredia Ferguson.

## 2016-06-28 NOTE — H&P (Signed)
History and Physical    [name] ZOX:[id number] DOB: 10-01-1965 DOA: 06/27/2016  PCP: Boykin Nearing, MD   Patient coming from: [facility].  I have personally briefly reviewed patient's old medical records in [place]  Chief Complaint: Shortness of breath.  HPI: [name] is a 51 y.o. male with medical history significant of CHF, chronic ulcer of sacral region, COPD, daily smoker, type 2 diabetes, hypertension, sleep apnea, tracheostomy dependent recently admitted from 06/10/2016 and 2 06/22/2016 for hypoxia and volume overload who is coming to the emergency department via EMS with complaints of progressively worse dyspnea with minimal exertion for about a week, associated with lower extremity and abdomen edema. He mentions that he did not take one of his furosemide doses, but is documented by EGD provider that he may have missed several doses. He denies fever, chills, chest pain, palpitations, dizziness, diaphoresis, but complains of orthopnea and PND. He denies abdominal pain, diarrhea, constipation, melena or hematochezia. He denies dysuria, hematuria or frequency. He has declined oxygen supplementation in the emergency department and does not want to be monitor in the stepdown area.  ED Course: The patient was given furosemide 60 mg IVP 1 dose. EKG shows artifact and Baseline wander II, III and AVF. Questionable ST elevation. Troponin level was normal and BNP was 33.5 pg/mL, but the patient is morbidly obese. WBC 20.6, hemoglobin 13.9 g/dL and platelets 338. Sodium 137, potassium 3.8, chloride 95 and bicarbonate 33 mmol/L. BUN 16, creatinine 0.79, glucose 108, magnesium 1.7 and phosphorus 4.4 mg/dL. His chest radiograph is showing cardiomegaly with central vascular congestion and mild diffuse pulmonary edema.  Review of Systems: As per HPI otherwise 10 point review of systems negative.    Past Medical History:  Diagnosis Date  . CHF (congestive heart failure) [facility])  Dx Oct 2012  . Chronic ulcer of sacral region ([place]) 2012  . COPD (chronic obstructive pulmonary disease) ([place]) Dx 2013  . Diabetes mellitus without complication [facility]) Dx Nov 2015  . Hypertension Dx Oct 2012  . Sleep apnea Dx Oct 2011  . Ventilator dependent [facility])     Past Surgical History:  Procedure Laterality Date  . TRACHEOSTOMY TUBE PLACEMENT    . WOUND DEBRIDEMENT  12/21/2010   Procedure: DEBRIDEMENT WOUND;  Surgeon: Harl [facility], MD;  Location: [facility];  Service: General;  Laterality: N/A;  Sacral     reports that he has been smoking Cigarettes.  He has never used smokeless tobacco. He reports that he uses drugs, including Marijuana. He reports that he does not drink alcohol.  Allergies  Allergen Reactions  . Tramadol Anaphylaxis, Swelling and Other (See Comments)    Reaction:  Tongue swelling   . Ibuprofen Swelling and Other (See Comments)    Reaction:  Tongue swelling   . Robaxin [Methocarbamol] Other (See Comments)    Reaction:  GI bleeding     Family History  Problem Relation Age of Onset  . Stomach cancer Brother   . Chronic Renal Failure Sister     Prior to Admission medications   Medication Sig Start Date End Date Taking? Authorizing Provider  ACCU-CHEK FASTCLIX LANCETS MISC 1 each by Does not apply route 3 (three) times daily. E11.9 05/02/14   Funches, Adriana Mccallum, MD  albuterol (PROVENTIL) (2.5 MG/3ML) 0.083% nebulizer solution Take 3 mLs (2.5 mg total) by nebulization every 6 (six) hours as needed for wheezing or shortness of breath. 03/10/15   Boykin Nearing, MD  aspirin EC 81 MG  EC tablet Take 1 tablet (81 mg total) by mouth daily. 06/22/16   Isaac Bliss, Rayford Halsted, MD  atorvastatin (LIPITOR) 20 MG tablet Take 1 tablet (20 mg total) by mouth daily. 05/05/14   Funches, Adriana Mccallum, MD  Blood Glucose Monitoring Suppl (ACCU-CHEK NANO SMARTVIEW) W/DEVICE KIT 1 Device by Does not apply route as needed. E 11.9 05/02/14   Funches, Josalyn, MD  carvedilol (COREG) 6.25  MG tablet Take 1 tablet (6.25 mg total) by mouth 2 (two) times daily with a meal. 06/21/16   Isaac Bliss, Rayford Halsted, MD  furosemide (LASIX) 40 MG tablet Take 1.5 tablets (60 mg total) by mouth 2 (two) times daily. Take 1 tab in the morning and 1 tab at 2 pm 06/21/16   Isaac Bliss, Rayford Halsted, MD  glucose blood (ACCU-CHEK SMARTVIEW) test strip 1 each by Other route 3 (three) times daily. E11.9 05/02/14   Boykin Nearing, MD  Multiple Vitamin (MULTIVITAMIN WITH MINERALS) TABS tablet Take 1 tablet by mouth daily. 06/22/16   Isaac Bliss, Rayford Halsted, MD  polyethylene glycol [facility] Floria Raveling) packet Take 17 g by mouth 2 (two) times daily as needed for mild constipation. Reported on 08/03/2015    [provider]  potassium chloride SA (K-DUR,KLOR-CON) 20 MEQ tablet Take 1 tablet (20 mEq total) by mouth daily. 08/04/14   Dorothy Spark, MD  sitaGLIPtin (JANUVIA) 100 MG tablet Take 1 tablet (100 mg total) by mouth daily. 06/21/16   Erline Hau, MD    Physical Exam: Vitals:   06/27/16 2318 06/28/16 0030 06/28/16 0035 06/28/16 0145  BP:    (!) 133/98  Pulse:  91 88   Resp:      Temp:      TempSrc:      SpO2: 92% (!) 87% (!) 82%     Constitutional: Mildly dyspneic. es: PERRL, lids and conjunctivae normal ENMT: Mucous membranes are moist. Posterior pharynx clear of any exudate or lesions. Neck: Positive tracheostomy, supple, no masses, no thyromegaly Respiratory: Bibasilar rales, no wheezing. Shallow respiratory effort. Cardiovascular: Regular rate and rhythm, no murmurs / rubs / gallops. +3+ lower extremity edema and grade 2 lymphedema. 2+ pedal pulses. No carotid bruits.  Abdomen: Obese, positive anasarca, soft no tenderness, no masses palpated. No hepatosplenomegaly. Bowel sounds positive.  Musculoskeletal: Mild clubbing / cyanosis. Good ROM, no contractures. Normal muscle tone.  Skin: Stage II sacral decubiti ulcer with foul-smelling discharge, but unable to  fully evaluate due to patient body habitus. Neurologic: CN 2-12 grossly intact. Sensation intact, DTR normal. Strength 5/5 in all 4.  Psychiatric: Normal judgment and insight. Alert and oriented x 3. Normal mood.    Labs on Admission: I have personally reviewed following labs and imaging studies  CBC:  Recent Labs Lab 06/27/16 2323  WBC 20.6*  NEUTROABS 14.1*  HGB 13.9  HCT 39.9  MCV 75.0*  PLT 793   Basic Metabolic Panel:  Recent Labs Lab 06/27/16 2323 06/28/16 0106  NA 137  --   K 3.8  --   CL 95*  --   CO2 33*  --   GLUCOSE 108*  --   BUN 16  --   CREATININE 0.79  --   CALCIUM 9.1  --   MG  --  1.7  PHOS  --  4.4   GFR: Estimated Creatinine Clearance: 181.6 mL/min (by C-G formula based on SCr of 0.79 mg/dL). Liver Function Tests: No results for input(s): AST, ALT, ALKPHOS, BILITOT, PROT, ALBUMIN in  the last 168 hours. No results for input(s): LIPASE, AMYLASE in the last 168 hours. No results for input(s): AMMONIA in the last 168 hours. Coagulation Profile: No results for input(s): INR, PROTIME in the last 168 hours. Cardiac Enzymes:  Recent Labs Lab 06/28/16 0106  TROPONINI <0.03   BNP (last 3 results) No results for input(s): PROBNP in the last 8760 hours. HbA1C: No results for input(s): HGBA1C in the last 72 hours. CBG:  Recent Labs Lab 06/21/16 0727 06/21/16 1124  GLUCAP 165* 200*   Lipid Profile: No results for input(s): CHOL, HDL, LDLCALC, TRIG, CHOLHDL, LDLDIRECT in the last 72 hours. Thyroid Function Tests: No results for input(s): TSH, T4TOTAL, FREET4, T3FREE, THYROIDAB in the last 72 hours. Anemia Panel: No results for input(s): VITAMINB12, FOLATE, FERRITIN, TIBC, IRON, RETICCTPCT in the last 72 hours. Urine analysis:    Component Value Date/Time   COLORURINE YELLOW 06/10/2016 1254   APPEARANCEUR CLEAR 06/10/2016 1254   LABSPEC 1.013 06/10/2016 1254   PHURINE 7.0 06/10/2016 1254   GLUCOSEU NEGATIVE 06/10/2016 1254   HGBUR  NEGATIVE 06/10/2016 1254   BILIRUBINUR NEGATIVE 06/10/2016 1254   KETONESUR NEGATIVE 06/10/2016 1254   PROTEINUR 100 (A) 06/10/2016 1254   UROBILINOGEN 2.0 (H) 12/13/2010 0430   NITRITE NEGATIVE 06/10/2016 1254   LEUKOCYTESUR TRACE (A) 06/10/2016 1254    Radiological Exams on Admission: Dg Chest Port 1 View  Result Date: 06/27/2016 CLINICAL DATA:  Shortness of breath for 1 week EXAM: PORTABLE CHEST 1 VIEW COMPARISON:  06/10/2016 FINDINGS: Cardiomegaly with central vascular congestion and mild diffuse pulmonary edema. Atelectasis at the left base. No large effusion. IMPRESSION: Cardiomegaly with central vascular congestion and mild diffuse pulmonary edema Electronically Signed   By: Donavan Foil M.D.   On: 06/27/2016 23:00   Echocardiogram 06/11/2016 ------------------------------------------------------------------- LV EF: 60% -   65%  ------------------------------------------------------------------- History:   PMH:   Dyspnea.  Risk factors:  Cardiomegaly Lifelong nonsmoker. No hypertension. Morbidly obese. No dyslipidemia.  ------------------------------------------------------------------- Study Conclusions  - Left ventricle: The cavity size was normal. Wall thickness was   increased in a pattern of moderate LVH. Systolic function was   normal. The estimated ejection fraction was in the range of 60%   to 65%. Wall motion was normal; there were no regional wall   motion abnormalities. Left ventricular diastolic function   parameters were normal.   EKG: Independently reviewed. Vent. rate 83 BPM PR interval * ms QRS duration 92 ms QT/QTc 388/459 ms P-R-T axes 59 268 80 Sinus rhythm Left anterior fascicular block Probable anterior infarct, age indeterminate ST elevation, consider inferior injury Baseline wander in lead(s) II III aVF  Assessment/Plan Principal Problem:   Volume overload Declined to be admitted to stepdown. Agreed to telemetry on the medical  floor. Does not want to be given supplemental oxygen due to headache. He declined prophylactic analgesics for headache. Monitor intake and output. Daily weights. Continue IV furosemide with electrolyte monitoring and replacement. Monitor renal function daily.  Active Problems:   Obesity hypoventilation syndrome (HCC) Declined supplemental oxygen. Continue room air humidifier.    Type II diabetes mellitus (HCC) Carbohydrate modified diet. Tradjenta 5 mg by mouth daily. CBG monitoring with regular insulin sliding scale.    Current smoker. He declined nicotine replacement therapy.    COPD (chronic obstructive pulmonary disease) (HCC) Declined supplemental oxygen. Continue albuterol nebs treatment as needed.    Chronic ulcer of sacral region [facility]) The patient's have been on and off on oral antibiotics. Ceftriaxone 2 g IV  PB every 24 hours. Follow-up WBC.     DVT prophylaxis: Lovenox SQ. Code Status: Full code. Family Communication:  Disposition Plan: Admit for IV furosemide and ceftriaxone treatment. Consults called:  Admission status: Observation/telemetry.   Reubin Milan MD [facility] Pager [phone].  If 7PM-7AM, please contact night-coverage [email or web address] Password [email or web address]  06/28/2016, 1:55 AM

## 2016-06-28 NOTE — Progress Notes (Signed)
Went in patient's room to administer him his medications.  Patient had requested something for pain but had no PRNs for pain.  I explained to him I paged the doctor for this reason.  He asked me "How long?" to which I replied I wasn't sure, but he would put an order in or call me back within a reasonable amount of time. Patient then asked why I had increased his 02.  I stated that his 02 had been in the low 80s.  Patient aggressive with me, shaking his hands at me, acting like he was going to get up and come towards me and pushed his bedside table out at me as I was walking by.  He stated he wanted to see my supervisor.  Patient has not been very cooperative during his stay here, wanting to dictate his care (whether or not he wears 02, how he is suctioned, which unit he is sent to).  Charge Nurse Mable Fill is aware and will see patient.  Medications were not given at this time due to the patient's behavior.

## 2016-06-28 NOTE — Care Management Note (Signed)
Case Management Note  Patient Details  Name: [name] MRN: [id number] Date of Birth: Jun 07, 1965  Subjective/Objective:50 y/o m admitted w/volume overload. Recent admission. Hx: indep w/trach, sacral wounds,smoker. From ALF-[facility]-Pt cons-await recc. CSW also following. Cons for LTAC-not appropriate for [facility], [facility] rep [place] aware & will follow-patient to think about LTAC @ [facility].                    Action/Plan:d/c plan LTAC    Expected Discharge Date:                  Expected Discharge Plan:  Long Term Acute Care (LTAC)  In-House Referral:     Discharge planning Services  CM Consult  Post Acute Care Choice:    Choice offered to:     DME Arranged:    DME Agency:     HH Arranged:    HH Agency:     Status of Service:  In process, will continue to follow  If discussed at Long Length of Stay Meetings, dates discussed:    Additional Comments:  Dessa Phi, RN 06/28/2016, 11:53 AM

## 2016-06-28 NOTE — Progress Notes (Signed)
The patient was admitted early this AM after midnight and H and P has been reviewed and I am in current agreement with the Assessment and Plan done by Dr. Olevia Bowens. Patient is a morbidly obese AAM with a pMH of COPD, Tobacco Abuse, HTN, Obstructive Apnea, Tracheostomy for Obesity Hypoventilation Syndrome and a Hx of Diastolic CHF who was just admitted from 06/10/16 - 06/22/16 for hypoxia and volume overload who represented to the ED for progressively worsening dyspnea and exertion for approximately a week. He also had lower extremity and abdominal edema and has not been compliant with his Lasix. He was going to be placed in the SDU unit but adamantly refused and was admitted to the Medical Floor with Telemetry. Upon Examination patient has 2+ lower Extremity Edema and Abdominal Edema. His Sacral Ulcer has a foul odor to it so he was placed on IV Ceftriaxone and will continue as he has had a Leukocytosis. Patient currently being diuresed with IV Lasix 40 mg BID. Patient had been refusing O2 Supplementation and finally agreed when nurse and RT insisted when his O2 Saturations dropped to the 60's. He was evaluated by LTAC but is not a current candidate due to patient's current behavior refusing some interventions. We will continue to Diurese and Monitor patient's clinical response to interventions and repeat Blood work in AM.

## 2016-06-28 NOTE — Care Management Note (Signed)
Case Management Note  Patient Details  Name: [name] MRN: [id number] Date of Birth: [birth year]  Subjective/Objective: Due to patient's current behavior-[facility] rep has declined LTAC offer. D/c plan return back to ALF-[facility]-CSW notified.MD updated.                   Action/Plan:d/c ALF   Expected Discharge Date:                  Expected Discharge Plan:  Assisted Living / Rest Home  In-House Referral:     Discharge planning Services  CM Consult  Post Acute Care Choice:    Choice offered to:     DME Arranged:    DME Agency:     HH Arranged:    HH Agency:     Status of Service:  In process, will continue to follow  If discussed at Long Length of Stay Meetings, dates discussed:    Additional Comments:  Dessa Phi, RN 06/28/2016, 1:32 PM

## 2016-06-28 NOTE — Care Management Obs Status (Signed)
[place] NOTIFICATION   Patient Details  Name: [name] MRN: [id number] Date of Birth: February 04, 1966   Medicare Observation Status Notification Given:  Yes    MahabirJuliann Pulse, RN 06/28/2016, 1:38 PM

## 2016-06-28 NOTE — ED Notes (Signed)
Pt refuses oxygen no resp complaints at this time

## 2016-06-29 DIAGNOSIS — I10 Essential (primary) hypertension: Secondary | ICD-10-CM

## 2016-06-29 LAB — CBC WITH DIFFERENTIAL/PLATELET
Basophils Absolute: 0 10*3/uL (ref 0.0–0.1)
Basophils Relative: 0 %
EOS ABS: 0.3 10*3/uL (ref 0.0–0.7)
Eosinophils Relative: 2 %
HEMATOCRIT: 38.9 % — AB (ref 39.0–52.0)
HEMOGLOBIN: 13.3 g/dL (ref 13.0–17.0)
LYMPHS ABS: 4.2 10*3/uL — AB (ref 0.7–4.0)
Lymphocytes Relative: 24 %
MCH: 26.1 pg (ref 26.0–34.0)
MCHC: 34.2 g/dL (ref 30.0–36.0)
MCV: 76.4 fL — AB (ref 78.0–100.0)
MONOS PCT: 11 %
Monocytes Absolute: 1.9 10*3/uL — ABNORMAL HIGH (ref 0.1–1.0)
NEUTROS ABS: 11.1 10*3/uL — AB (ref 1.7–7.7)
NEUTROS PCT: 63 %
Platelets: 316 10*3/uL (ref 150–400)
RBC: 5.09 MIL/uL (ref 4.22–5.81)
RDW: 16 % — ABNORMAL HIGH (ref 11.5–15.5)
WBC: 17.5 10*3/uL — AB (ref 4.0–10.5)

## 2016-06-29 LAB — COMPREHENSIVE METABOLIC PANEL
ALK PHOS: 61 U/L (ref 38–126)
ALT: 28 U/L (ref 17–63)
AST: 17 U/L (ref 15–41)
Albumin: 3.4 g/dL — ABNORMAL LOW (ref 3.5–5.0)
Anion gap: 7 (ref 5–15)
BILIRUBIN TOTAL: 1 mg/dL (ref 0.3–1.2)
BUN: 16 mg/dL (ref 6–20)
CALCIUM: 9 mg/dL (ref 8.9–10.3)
CHLORIDE: 93 mmol/L — AB (ref 101–111)
CO2: 38 mmol/L — ABNORMAL HIGH (ref 22–32)
CREATININE: 0.82 mg/dL (ref 0.61–1.24)
Glucose, Bld: 182 mg/dL — ABNORMAL HIGH (ref 65–99)
Potassium: 4.5 mmol/L (ref 3.5–5.1)
Sodium: 138 mmol/L (ref 135–145)
TOTAL PROTEIN: 7.3 g/dL (ref 6.5–8.1)

## 2016-06-29 LAB — URINALYSIS, ROUTINE W REFLEX MICROSCOPIC
Bacteria, UA: NONE SEEN
Bilirubin Urine: NEGATIVE
GLUCOSE, UA: NEGATIVE mg/dL
Hgb urine dipstick: NEGATIVE
KETONES UR: NEGATIVE mg/dL
Nitrite: NEGATIVE
PH: 5 (ref 5.0–8.0)
Protein, ur: NEGATIVE mg/dL
SPECIFIC GRAVITY, URINE: 1.008 (ref 1.005–1.030)

## 2016-06-29 LAB — GLUCOSE, CAPILLARY
GLUCOSE-CAPILLARY: 151 mg/dL — AB (ref 65–99)
GLUCOSE-CAPILLARY: 111 mg/dL — AB (ref 65–99)
GLUCOSE-CAPILLARY: 198 mg/dL — AB (ref 65–99)
Glucose-Capillary: 185 mg/dL — ABNORMAL HIGH (ref 65–99)

## 2016-06-29 LAB — MAGNESIUM: Magnesium: 1.9 mg/dL (ref 1.7–2.4)

## 2016-06-29 LAB — PHOSPHORUS: Phosphorus: 4.2 mg/dL (ref 2.5–4.6)

## 2016-06-29 MED ORDER — CARVEDILOL 6.25 MG PO TABS
6.2500 mg | ORAL_TABLET | Freq: Two times a day (BID) | ORAL | Status: DC
  Administered 2016-06-29 – 2016-07-12 (×25): 6.25 mg via ORAL
  Filled 2016-06-29 (×25): qty 1

## 2016-06-29 MED ORDER — ONDANSETRON HCL 4 MG PO TABS: 4.0000 mg | ORAL_TABLET | Freq: Four times a day (QID) | ORAL | Status: DC | PRN

## 2016-06-29 MED ORDER — FUROSEMIDE 10 MG/ML IJ SOLN
40.0000 mg | Freq: Three times a day (TID) | INTRAMUSCULAR | Status: DC
  Administered 2016-06-29 – 2016-07-01 (×5): 40 mg via INTRAVENOUS
  Filled 2016-06-29 (×6): qty 4

## 2016-06-29 MED ORDER — ASPIRIN-ACETAMINOPHEN-CAFFEINE 250-250-65 MG PO TABS
1.0000 | ORAL_TABLET | Freq: Three times a day (TID) | ORAL | Status: DC | PRN
  Administered 2016-06-29 – 2016-07-05 (×7): 1 via ORAL
  Filled 2016-06-29 (×7): qty 1

## 2016-06-29 MED ORDER — ONDANSETRON HCL 4 MG/2ML IJ SOLN
4.0000 mg | Freq: Four times a day (QID) | INTRAMUSCULAR | Status: DC | PRN
  Administered 2016-07-02 – 2016-07-03 (×3): 4 mg via INTRAVENOUS
  Filled 2016-06-29 (×3): qty 2
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
## 2016-06-29 NOTE — Evaluation (Signed)
Physical Therapy Evaluation Patient Details Name: [name] MRN: [id number] DOB: Jul 14, 1965 Today's Date: 06/29/2016   History of Present Illness  [name] is a 51 y.o. male with medical history significant of CHF, chronic ulcer of sacral region, COPD, daily smoker, type 2 diabetes, hypertension, sleep apnea, tracheostomy dependent recently admitted from 05/07-19/2018  for hypoxia , admitted 5/24 for  progressive/increased dyspnea   Clinical Impression  The patient was cooperative to ambulate. He requested and OT consult for Adaptive equipment to be independent, then said he didn't want it. Pt admitted with above diagnosis. Pt currently with functional limitations due to the deficits listed below (see PT Problem List). Pt will benefit from skilled PT to increase their independence and safety with mobility to allow discharge to the venue listed below.       Follow Up Recommendations  none    Equipment Recommendations    none   Recommendations for Other Services       Precautions / Restrictions Precautions Precautions: Fall Restrictions Weight Bearing Restrictions: No      Mobility  Bed Mobility               General bed mobility comments: in recliner  Transfers Overall transfer level: Modified independent Equipment used: Rolling walker (2 wheeled)                Ambulation/Gait Ambulation/Gait assistance: Supervision Ambulation Distance (Feet): 60 Feet (x 2) Assistive device: Rolling walker (2 wheeled) Gait Pattern/deviations: WFL(Within Functional Limits)     General Gait Details: no assistanc e required. Sats 89 % on RA  Stairs            Wheelchair Mobility    Modified Rankin (Stroke Patients Only)       Balance                                             Pertinent Vitals/Pain Pain Assessment: No/denies pain    Home Living Family/patient expects to be discharged to:: Assisted living                Home Equipment: Walker - 2 wheels;Cane - single point      Prior Function Level of Independence: Needs assistance   Gait / Transfers Assistance Needed: assistance for  ADL's           Hand Dominance        Extremity/Trunk Assessment   Upper Extremity Assessment Upper Extremity Assessment: Overall WFL for tasks assessed    Lower Extremity Assessment Lower Extremity Assessment: Overall WFL for tasks assessed    Cervical / Trunk Assessment Cervical / Trunk Assessment: Normal  Communication   Communication: Tracheostomy  Cognition Arousal/Alertness: Awake/alert Behavior During Therapy: WFL for tasks assessed/performed Overall Cognitive Status: Within Functional Limits for tasks assessed                                        General Comments      Exercises     Assessment/Plan    PT Assessment Patient needs continued PT services  PT Problem List Decreased mobility;Decreased activity tolerance       PT Treatment Interventions Gait training;Functional mobility training    PT Goals (Current goals can be found in the Care Plan section)  Acute Rehab PT Goals Patient Stated Goal: wants to be independent PT Goal Formulation: With patient Time For Goal Achievement: 07/13/16 Potential to Achieve Goals: Good    Frequency Min 2X/week   Barriers to discharge        Co-evaluation               AM-PAC PT "6 Clicks" Daily Activity  Outcome Measure                  End of Session   Activity Tolerance: Patient tolerated treatment well Patient left: in chair Nurse Communication: Mobility status PT Visit Diagnosis: Difficulty in walking, not elsewhere classified (R26.2)    Time: 7253-6644 PT Time Calculation (min) (ACUTE ONLY): 18 min   Charges:   PT Evaluation $PT Eval Low Complexity: 1 Procedure     PT G CodesTresa [name] PT [phone]   [name] 06/29/2016, 11:54 AM

## 2016-06-29 NOTE — Progress Notes (Signed)
Pt seen and given scheduled nebulizer treatment on room air which he tolerated well.  Pt found on room air atc, with o2 sats 86-88%.  Pt was not on bedside pulseox due to his request/non-compliance.  This RT checked his HR/o2 sats pre and post neb, which he became very upset about.  Pt said," Why do you keep doing that?"  Pt educated on RT policy, but pt became more upset and belligerent.  O2 sats post neb tx dropped to 81%  Pt yelled for me to get out of his room.  This RT expressed concern for his low O2 sats, but pt continued to yell at me to get out of his room.  This RT apologized and advised pt that I will return later to check on him.  RN notified of events.

## 2016-06-29 NOTE — Evaluation (Signed)
Occupational Therapy Evaluation Patient Details Name: [name] MRN: [id number] DOB: [birth year] Today's Date: 06/29/2016    History of Present Illness [name] is a 51 y.o. male with medical history significant of CHF, chronic ulcer of sacral region, COPD, daily smoker, type 2 diabetes, hypertension, sleep apnea, tracheostomy dependent recently admitted from 05/07-19/2018  for hypoxia , admitted 5/24 for  progressive/increased dyspnea    Clinical Impression   Pt admitted with increased dyspnea. Pt currently with functional limitations due to the deficits listed below (see OT Problem List).  Pt will benefit from skilled OT to increase their safety and independence with ADL and functional mobility for ADL to facilitate discharge to venue listed below.      Follow Up Recommendations  No OT follow up    Equipment Recommendations  None recommended by OT       Precautions / Restrictions Precautions Precautions: Fall      Mobility Bed Mobility               General bed mobility comments: in recliner  Transfers Overall transfer level: Modified independent Equipment used: Rolling walker (2 wheeled)                      ADL either performed or assessed with clinical judgement   ADL Overall ADL's : Needs assistance/impaired                                       General ADL Comments: Pt asked OT about AE for wiping and LB dressing. Pt did not feel like practing but was educated,  OT issued toilet aid , reacher and sock aid     Vision Patient Visual Report: No change from baseline              Pertinent Vitals/Pain Pain Assessment: No/denies pain     Hand Dominance     Extremity/Trunk Assessment Upper Extremity Assessment Upper Extremity Assessment: Overall WFL for tasks assessed   Lower Extremity Assessment Lower Extremity Assessment: Overall WFL for tasks assessed   Cervical / Trunk Assessment Cervical / Trunk Assessment:  Normal   Communication Communication Communication: Tracheostomy   Cognition Arousal/Alertness: Awake/alert Behavior During Therapy: WFL for tasks assessed/performed Overall Cognitive Status: Within Functional Limits for tasks assessed                                                Home Living Family/patient expects to be discharged to:: Assisted living                             Home Equipment: Walker - 2 wheels;Cane - single point          Prior Functioning/Environment Level of Independence: Needs assistance  Gait / Transfers Assistance Needed: assistance for  ADL's              OT Problem List: Decreased knowledge of use of DME or AE;Obesity      OT Treatment/Interventions: Self-care/ADL training    OT Goals(Current goals can be found in the care plan section) Acute Rehab OT Goals Patient Stated Goal: wants to be independent OT Goal Formulation: With patient Time For Goal Achievement: 07/06/16 Potential to  Achieve Goals: Good  OT Frequency: Min 2X/week   Barriers to D/C:               AM-PAC PT "6 Clicks" Daily Activity     Outcome Measure Help from another person eating meals?: None Help from another person taking care of personal grooming?: None Help from another person toileting, which includes using toliet, bedpan, or urinal?: A Lot Help from another person bathing (including washing, rinsing, drying)?: A Lot Help from another person to put on and taking off regular upper body clothing?: A Little Help from another person to put on and taking off regular lower body clothing?: A Lot 6 Click Score: 17   End of Session Nurse Communication: Other (comment) (pt has Ae in which he can A with hygiene)  Activity Tolerance: Patient tolerated treatment well Patient left: in chair;with call bell/phone within reach  OT Visit Diagnosis: Muscle weakness (generalized) (M62.81)                Time: 4320-0379 OT Time Calculation  (min): 12 min Charges:  OT General Charges $OT Visit: 1 Procedure OT Evaluation $OT Eval Moderate Complexity: 1 Procedure G-Codes:     Kari Baars, OT [phone]   Payton Mccallum D 06/29/2016, 2:40 PM

## 2016-06-29 NOTE — Progress Notes (Signed)
RT went to patients room to suction patient. Patient had a headach and wanted to go back on medical air for his humidification. Patient still aware that the oxygen is needed for his oxygenation. RT will continue to monitor.

## 2016-06-30 ENCOUNTER — Inpatient Hospital Stay (HOSPITAL_COMMUNITY): Payer: Medicare Other

## 2016-06-30 LAB — CBC WITH DIFFERENTIAL/PLATELET
Basophils Absolute: 0 10*3/uL (ref 0.0–0.1)
Basophils Relative: 0 %
Eosinophils Absolute: 0.4 10*3/uL (ref 0.0–0.7)
Eosinophils Relative: 2 %
HEMATOCRIT: 39.4 % (ref 39.0–52.0)
HEMOGLOBIN: 13.5 g/dL (ref 13.0–17.0)
LYMPHS ABS: 4.1 10*3/uL — AB (ref 0.7–4.0)
LYMPHS PCT: 25 %
MCH: 26.1 pg (ref 26.0–34.0)
MCHC: 34.3 g/dL (ref 30.0–36.0)
MCV: 76.2 fL — AB (ref 78.0–100.0)
MONOS PCT: 11 %
Monocytes Absolute: 1.8 10*3/uL — ABNORMAL HIGH (ref 0.1–1.0)
NEUTROS ABS: 10 10*3/uL — AB (ref 1.7–7.7)
Neutrophils Relative %: 62 %
Platelets: 317 10*3/uL (ref 150–400)
RBC: 5.17 MIL/uL (ref 4.22–5.81)
RDW: 16 % — ABNORMAL HIGH (ref 11.5–15.5)
WBC: 16.3 10*3/uL — ABNORMAL HIGH (ref 4.0–10.5)

## 2016-06-30 LAB — GLUCOSE, CAPILLARY
Glucose-Capillary: 168 mg/dL — ABNORMAL HIGH (ref 65–99)
Glucose-Capillary: 188 mg/dL — ABNORMAL HIGH (ref 65–99)
Glucose-Capillary: 86 mg/dL (ref 65–99)

## 2016-06-30 LAB — COMPREHENSIVE METABOLIC PANEL
ALBUMIN: 3.6 g/dL (ref 3.5–5.0)
ALT: 27 U/L (ref 17–63)
AST: 21 U/L (ref 15–41)
Alkaline Phosphatase: 60 U/L (ref 38–126)
Anion gap: 8 (ref 5–15)
BUN: 16 mg/dL (ref 6–20)
CHLORIDE: 93 mmol/L — AB (ref 101–111)
CO2: 39 mmol/L — AB (ref 22–32)
CREATININE: 0.84 mg/dL (ref 0.61–1.24)
Calcium: 9 mg/dL (ref 8.9–10.3)
GFR calc Af Amer: >60 mL/min (ref >60)
GLUCOSE: 162 mg/dL — AB (ref 65–99)
Potassium: 4 mmol/L (ref 3.5–5.1)
Sodium: 140 mmol/L (ref 135–145)
Total Bilirubin: 0.9 mg/dL (ref 0.3–1.2)
Total Protein: 7.6 g/dL (ref 6.5–8.1)

## 2016-06-30 LAB — MAGNESIUM: Magnesium: 1.9 mg/dL (ref 1.7–2.4)

## 2016-06-30 LAB — PHOSPHORUS: PHOSPHORUS: 3.9 mg/dL (ref 2.5–4.6)

## 2016-06-30 MED ORDER — HYDROCODONE-ACETAMINOPHEN 5-325 MG PO TABS
1.0000 | ORAL_TABLET | Freq: Four times a day (QID) | ORAL | Status: DC | PRN
  Administered 2016-06-30 – 2016-07-03 (×10): 1 via ORAL
  Filled 2016-06-30 (×10): qty 1

## 2016-06-30 NOTE — Progress Notes (Signed)
No distress noted. Pt does not want rt to ck SpO2 and hr. All trach equipment at bedside if needed

## 2016-06-30 NOTE — Progress Notes (Signed)
Rt stop by pt room to see if he needed any thing at this time. Pt doing ok rt not needed.

## 2016-06-30 NOTE — Progress Notes (Signed)
Pt requested to speak with his nurse (myself) and appeared upset requesting that he not be placed on oxygen. Stated oxygen therapy gives him a headache. Pt is aware of his O2 sats (they continue to drop into the 80's), however he is adamant that he doesn't want the nursing staff nor the RT team to place him on oxygen.   Pt stated that he would comply with RT making their scheduled rounds on him however he would like them to be advised regarding his request for no oxygen treatment.   Spoke with RT and she was in agreement to comply with his request.  [place]

## 2016-06-30 NOTE — Consult Note (Signed)
[facility] Nurse wound consult note Reason for Consult:Healling Stage 4 pressure Injury.  Last seen by this writer on 06/11/16, three weeks ago. Wound type:Pressure Pressure Injury POA: Yes Measurement:0.6cm x 2.5cm x 0.2cm  Wound bed: pink and yellow, moist (50% pink/50% yellow slough) Drainage (amount, consistency, odor) Small to moderate amount of light yellow exudate noted on pillowcase covering chair Periwound:With evidence of previous wound healing (scar tissue) also maceration from drainage and perspiration Dressing procedure/placement/frequency: We will implement twice daily saline dressing using an opened saline moistened gauze 2x2. No cover dressing. Note: Patient is requesting to forego the bariatric mattress replacement with low air loss feature this admission stating he does not like the bed and feels unsafe in it.He assures me that he will rotate between the chair and the "standard"  hospital bed for pressure redistribution. He voices no other needs at this time. [place] nursing team will not follow, but will remain available to this patient, the nursing and medical teams.  Please re-consult if needed. Thanks, Maudie Flakes, MSN, RN, [place], Arther Abbott  Pager# [phone]
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
## 2016-06-30 NOTE — Progress Notes (Signed)
Pt seen, remains on room air atc.  No distress noted.  Pt did not want me to check his HR/Spo2 with pulse oximeter and declined tracheal suctioning at this time.  RT will continue to monitor and assess pt.

## 2016-06-30 NOTE — Progress Notes (Signed)
Rt ck on pt. Rt not needed at this time. No distress noted.

## 2016-07-01 LAB — CBC WITH DIFFERENTIAL/PLATELET
BASOS ABS: 0 10*3/uL (ref 0.0–0.1)
Basophils Relative: 0 %
Eosinophils Absolute: 0.4 10*3/uL (ref 0.0–0.7)
Eosinophils Relative: 2 %
HEMATOCRIT: 40.3 % (ref 39.0–52.0)
HEMOGLOBIN: 13.8 g/dL (ref 13.0–17.0)
Lymphocytes Relative: 23 %
Lymphs Abs: 4 10*3/uL (ref 0.7–4.0)
MCH: 26 pg (ref 26.0–34.0)
MCHC: 34.2 g/dL (ref 30.0–36.0)
MCV: 76 fL — AB (ref 78.0–100.0)
Monocytes Absolute: 1.7 10*3/uL — ABNORMAL HIGH (ref 0.1–1.0)
Monocytes Relative: 10 %
NEUTROS ABS: 11.3 10*3/uL — AB (ref 1.7–7.7)
NEUTROS PCT: 65 %
Platelets: 328 10*3/uL (ref 150–400)
RBC: 5.3 MIL/uL (ref 4.22–5.81)
RDW: 16 % — ABNORMAL HIGH (ref 11.5–15.5)
WBC: 17.3 10*3/uL — AB (ref 4.0–10.5)

## 2016-07-01 LAB — GLUCOSE, CAPILLARY
GLUCOSE-CAPILLARY: 197 mg/dL — AB (ref 65–99)
GLUCOSE-CAPILLARY: 202 mg/dL — AB (ref 65–99)
GLUCOSE-CAPILLARY: 220 mg/dL — AB (ref 65–99)
Glucose-Capillary: 226 mg/dL — ABNORMAL HIGH (ref 65–99)
Glucose-Capillary: 107 mg/dL — ABNORMAL HIGH (ref 65–99)

## 2016-07-01 LAB — COMPREHENSIVE METABOLIC PANEL
ALBUMIN: 3.6 g/dL (ref 3.5–5.0)
ALT: 27 U/L (ref 17–63)
AST: 20 U/L (ref 15–41)
Alkaline Phosphatase: 69 U/L (ref 38–126)
Anion gap: 10 (ref 5–15)
BILIRUBIN TOTAL: 0.7 mg/dL (ref 0.3–1.2)
BUN: 18 mg/dL (ref 6–20)
CO2: 38 mmol/L — ABNORMAL HIGH (ref 22–32)
CREATININE: 0.94 mg/dL (ref 0.61–1.24)
Calcium: 9.2 mg/dL (ref 8.9–10.3)
Chloride: 91 mmol/L — ABNORMAL LOW (ref 101–111)
GFR calc Af Amer: >60 mL/min (ref >60)
GLUCOSE: 148 mg/dL — AB (ref 65–99)
Potassium: 4.1 mmol/L (ref 3.5–5.1)
Sodium: 139 mmol/L (ref 135–145)
TOTAL PROTEIN: 7.6 g/dL (ref 6.5–8.1)

## 2016-07-01 LAB — MAGNESIUM: Magnesium: 1.8 mg/dL (ref 1.7–2.4)

## 2016-07-01 LAB — PHOSPHORUS: PHOSPHORUS: 4.5 mg/dL (ref 2.5–4.6)

## 2016-07-01 MED ORDER — FUROSEMIDE 10 MG/ML IJ SOLN
60.0000 mg | Freq: Three times a day (TID) | INTRAMUSCULAR | Status: DC
  Administered 2016-07-01 – 2016-07-10 (×27): 60 mg via INTRAVENOUS
  Filled 2016-07-01 (×28): qty 6

## 2016-07-01 MED ORDER — PREMIER PROTEIN SHAKE
11.0000 [oz_av] | Freq: Two times a day (BID) | ORAL | Status: DC
  Filled 2016-07-01: qty 325.31

## 2016-07-01 MED ORDER — PREMIER PROTEIN SHAKE
11.0000 [oz_av] | Freq: Three times a day (TID) | ORAL | Status: DC
  Administered 2016-07-01 – 2016-07-07 (×6): 11 [oz_av] via ORAL
  Filled 2016-07-01 (×22): qty 325.31

## 2016-07-01 NOTE — Progress Notes (Signed)
Report from [place], Therapist, sports. Care assumed for pt at this time. Pt sitting up in chair, denies SOB/pain/discomfort. Assessment unchanged from AM assessment.

## 2016-07-01 NOTE — Progress Notes (Signed)
Occupational Therapy Treatment Patient Details Name: [name] MRN: [id number] DOB: 1965-10-08 Today's Date: 07/01/2016    History of present illness [name] is a 51 y.o. male with medical history significant of CHF, chronic ulcer of sacral region, COPD, daily smoker, type 2 diabetes, hypertension, sleep apnea, tracheostomy dependent recently admitted from 05/07-19/2018  for hypoxia , admitted 5/24 for  progressive/increased dyspnea    OT comments  Will DC OT per pt request Last OT session pt wanted to be I with LB dressing and wiping his bottom.  OT had provided AE as pt did not have a way of obtaining.  Pt was belligerent this day and did not want to practice and did not want AE. RN aware    Follow Up Recommendations  No OT follow up    Equipment Recommendations  None recommended by OT           Mobility Bed Mobility               General bed mobility comments: in recliner  Transfers Overall transfer level: Modified independent Equipment used: Rolling walker (2 wheeled)                      ADL either performed or assessed with clinical judgement   ADL Overall ADL's : Needs assistance/impaired                                       General ADL Comments: OT returned as was communicated last OT session to practice AE. Pt became beligerant and stated he did not want to practice.  OT educated on importance of practicing to ensure pt could be I with LB dressing and  wiping as he had communicated was his goal.  Pt refused.  Pt then ask OT to removed AE from his room.  OT obtained Rn to make her aware of situation.  OT ask if pt wanted OT to check on him again and he said no and to take away the AE.  OT will sign off .               Cognition Arousal/Alertness: Awake/alert Behavior During Therapy: Agitated Overall Cognitive Status: Within Functional Limits for tasks assessed                                                 General Comments  Pt agitated this OT session. RN aware.               Frequency  Min 2X/week        Progress Toward Goals  OT Goals(current goals can now be found in the care plan section)  Progress towards OT goals:  (goals not met- pt does not want further OT)     Plan Other (comment) (will Dc per pt request)       AM-PAC PT "6 Clicks" Daily Activity     Outcome Measure   Help from another person eating meals?: None Help from another person taking care of personal grooming?: None Help from another person toileting, which includes using toliet, bedpan, or urinal?: A Lot Help from another person bathing (including washing, rinsing, drying)?: A Lot Help from another person to put on and taking off  regular upper body clothing?: A Little   6 Click Score: 15       Activity Tolerance Treatment limited secondary to agitation   Patient Left in chair   Nurse Communication Other (comment) (pts agitation)        Time: 0240-9735 OT Time Calculation (min): 14 min  Charges: OT General Charges $OT Visit: 1 Procedure OT Treatments $Self Care/Home Management : 8-22 mins  Kari Baars, [place]   Betsy Pries 07/01/2016, 10:42 AM

## 2016-07-01 NOTE — Progress Notes (Signed)
Initial Nutrition Assessment  DOCUMENTATION CODES:   Morbid obesity  INTERVENTION:  - Will order Premier Protein TID, each supplement provides 160 kcal, 30 grams of protein, and 5 grams of carb. - Will order double protein portions for all meals.  - Will continue to monitor for needs, including need for diet education.   NUTRITION DIAGNOSIS:   Increased nutrient needs (protein) related to wound healing (chronic stage 4 sacral wound) as evidenced by estimated needs.  GOAL:   Patient will meet greater than or equal to 90% of their needs  MONITOR:   PO intake, Supplement acceptance, Weight trends, Labs, Skin  REASON FOR ASSESSMENT:   Consult Assessment of nutrition requirement/status  ASSESSMENT:   51 y.o. male with medical history significant of CHF, chronic ulcer of sacral region, COPD, daily smoker, type 2 diabetes, hypertension, sleep apnea, tracheostomy dependent recently admitted from 06/10/2016 and 2 06/22/2016 for hypoxia and volume overload who is coming to the emergency department via EMS with complaints of progressively worse dyspnea with minimal exertion for about a week, associated with lower extremity and abdomen edema. He mentions that he did not take one of his furosemide doses, but is documented by EGD provider that he may have missed several doses.  Pt seen for consult. BMI indicates morbid obesity. Pt with 100% of all meals 5/26 and breakfast yesterday, per flowsheet. Visualized lunch tray with 100% completion. Pt reports that MD informed him that he would talk with RD about allowing double protein portions for meals; this is appropriate given chronic wound for which MD note states possible consult to Surgery. Will order double protein and protein shakes to aid in meeting protein needs. Pt reports that PTA he was at ALF and that he was unable to control meals or request double protein portions. He is hopeful that after d/c he will be able to get his own apartment and  make his own food choices. Will monitor for need for diet educations prior to d/c dependent on plan for living area following d/c.   Physical assessment shows no muscle or fat wasting; moderate and severe edema noted. Per chart review, current weight consistent with weight from 06/21/16 and +18 lbs since 11/30/15.  Medications reviewed; 60 mg IV Lasix TID, sliding scale Novolog, 5 mg Trajenta/day, daily multivitamin with minerals, 20 mEq oral KCl/day. Labs reviewed; CBGs: 202 and 226 mg/dL today, Cl: 91 mmol/L.   Diet Order:  Diet heart healthy/carb modified Room service appropriate? Yes; Fluid consistency: Thin; Fluid restriction: 1500 mL Fluid  Skin:   Stage 4 sacral pressure injury  Last BM:  5/27  Height:   Ht Readings from Last 1 Encounters:  06/30/16 5\' 7"  (1.702 m)    Weight:   Wt Readings from Last 1 Encounters:  07/01/16 (!) 422 lb 1.6 oz (191.5 kg)    Ideal Body Weight:  67.27 kg  BMI:  Body mass index is 66.11 kg/m.  Estimated Nutritional Needs:   Kcal:  1700-1749 (12-14 kcal/kg)  Protein:  >/= 148 grams (2.2 grams/kg IBW)  Fluid:  >/= 2 L/day  EDUCATION NEEDS:   Education needs no appropriate at this time    Jarome Matin, MS, RD, LDN, [place] [facility] Clinical Diet[facility] Pager # [phone] After hours/weekend pager # [phone]

## 2016-07-01 NOTE — Progress Notes (Signed)
Pt seen, still sitting in chair, no respiratory distress noted or voiced at this time.  Pt stated that he didn't need anything.  Pt remains on room air atc.  RT will continue to monitor and assess pt.
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
## 2016-07-01 NOTE — Progress Notes (Signed)
Pt is asleep in chair, no respiratory distress noted at this time.  Pt remains on room air atc.  RT will continue to monitor and assess pt.

## 2016-07-02 LAB — CBC WITH DIFFERENTIAL/PLATELET
BASOS PCT: 0 %
Basophils Absolute: 0 10*3/uL (ref 0.0–0.1)
EOS ABS: 0.4 10*3/uL (ref 0.0–0.7)
Eosinophils Relative: 2 %
HCT: 41 % (ref 39.0–52.0)
Hemoglobin: 13.9 g/dL (ref 13.0–17.0)
Lymphocytes Relative: 24 %
Lymphs Abs: 4.2 10*3/uL — ABNORMAL HIGH (ref 0.7–4.0)
MCH: 25.7 pg — ABNORMAL LOW (ref 26.0–34.0)
MCHC: 33.9 g/dL (ref 30.0–36.0)
MCV: 75.9 fL — ABNORMAL LOW (ref 78.0–100.0)
MONO ABS: 1.8 10*3/uL — AB (ref 0.1–1.0)
MONOS PCT: 10 %
Neutro Abs: 11 10*3/uL — ABNORMAL HIGH (ref 1.7–7.7)
Neutrophils Relative %: 64 %
Platelets: 339 10*3/uL (ref 150–400)
RBC: 5.4 MIL/uL (ref 4.22–5.81)
RDW: 16.1 % — AB (ref 11.5–15.5)
WBC: 17.4 10*3/uL — ABNORMAL HIGH (ref 4.0–10.5)

## 2016-07-02 LAB — GLUCOSE, CAPILLARY
Glucose-Capillary: 148 mg/dL — ABNORMAL HIGH (ref 65–99)
Glucose-Capillary: 171 mg/dL — ABNORMAL HIGH (ref 65–99)
Glucose-Capillary: 151 mg/dL — ABNORMAL HIGH (ref 65–99)
Glucose-Capillary: 133 mg/dL — ABNORMAL HIGH (ref 65–99)

## 2016-07-02 LAB — COMPREHENSIVE METABOLIC PANEL
ALBUMIN: 3.8 g/dL (ref 3.5–5.0)
ALT: 30 U/L (ref 17–63)
ANION GAP: 10 (ref 5–15)
AST: 22 U/L (ref 15–41)
Alkaline Phosphatase: 70 U/L (ref 38–126)
BILIRUBIN TOTAL: 1.1 mg/dL (ref 0.3–1.2)
BUN: 21 mg/dL — ABNORMAL HIGH (ref 6–20)
CO2: 39 mmol/L — AB (ref 22–32)
Calcium: 9.3 mg/dL (ref 8.9–10.3)
Chloride: 90 mmol/L — ABNORMAL LOW (ref 101–111)
Creatinine, Ser: 0.89 mg/dL (ref 0.61–1.24)
GFR calc Af Amer: >60 mL/min (ref >60)
GFR calc non Af Amer: >60 mL/min (ref >60)
Glucose, Bld: 140 mg/dL — ABNORMAL HIGH (ref 65–99)
Potassium: 4 mmol/L (ref 3.5–5.1)
SODIUM: 139 mmol/L (ref 135–145)
TOTAL PROTEIN: 8.1 g/dL (ref 6.5–8.1)

## 2016-07-02 LAB — PHOSPHORUS: PHOSPHORUS: 4.7 mg/dL — AB (ref 2.5–4.6)

## 2016-07-02 LAB — MAGNESIUM: Magnesium: 1.9 mg/dL (ref 1.7–2.4)

## 2016-07-02 NOTE — Progress Notes (Signed)
CSW spoke to patient briefly about return to ALF, patient confirmed plan to return to ALF. CSW following patient to assist with discharge back to ALF when medically ready.   Abundio Miu, [place] Social Worker [facility] Cell#: [phone]

## 2016-07-02 NOTE — Progress Notes (Signed)
PT Cancellation Note  Patient Details Name: [name] MRN: [id number] DOB: 10-06-1965   Cancelled Treatment:    Reason Eval/Treat Not Completed: Attempted PT tx session. Pt declined to participate at this time-requested PT check back later. Will check back as schedule allows.   [name] 07/02/2016, 9:29 AM

## 2016-07-02 NOTE — Progress Notes (Signed)
Agricultural consultant notified by [employer] of patient's O2 sats sustaining in the 64-75% range. The patient has a tracheostomy and writer has taken care of this patient in the past. This patient refuses to use oxygen via trach collar and will only use Room Air. Writer went to room to check on patient and noticed that he had just changed out his inner cannula. Writer asked patient if it was dirty/stopped up with phlem and the patient stated that was, however upon inspecting the old inner cannula it was clean and clear. Writer discussed with the patient his O2 sats and that he would benefit from oxygen use. The patient refused oxygen and stated that he was fine. Patient A&Ox4 per assessment done now by writer. Writer also offered deep suctioning via trach, but the patient also refused this being done. The patient was told to call if he needed assistance with this. Lung sounds were diminished, no crackles heard on ascultation. Bedside RN and [employer] updated on findings and patient's response. By this time, the O2 stats were up to 86% which is this patient's baseline. Bedside RN will continue to monitor the patient.   Othella Boyer [facility] 07/02/2016 11:15 AM

## 2016-07-02 NOTE — Care Management Note (Signed)
Case Management Note  Patient Details  Name: [name] MRN: [id number] Date of Birth: 09/18/1965  Subjective/Objective: Per long length of stay recommendation:[facility]-currently [facility] following-CHF protocal. No PT/OT.CSW following for return ALF.                   Action/Plan:d/c plan return back to ALF-[place]   Expected Discharge Date:                  Expected Discharge Plan:  Assisted Living / Rest Home  In-House Referral:     Discharge planning Services  CM Consult  Post Acute Care Choice:    Choice offered to:     DME Arranged:    DME Agency:     HH Arranged:    [place] Agency:     Status of Service:  In process, will continue to follow  If discussed at Long Length of Stay Meetings, dates discussed:    Additional Comments:  Dessa Phi, RN 07/02/2016, 1:24 PM

## 2016-07-02 NOTE — Progress Notes (Signed)
Rt ck on pt. Pt sitting in chair. No distress noted at this time. Pt stated he does not need rt to do anything at this time.

## 2016-07-02 NOTE — Progress Notes (Signed)
Pt does not need rt at this time. No distress noted.

## 2016-07-02 NOTE — Progress Notes (Signed)
Pt does not need rt at this time. Trach ck completed.
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
## 2016-07-02 NOTE — Progress Notes (Signed)
RN called RT about patients oxygenation being in the 50's and 60's. RT went to patients room and took oxygenation level and they were still in the 50's and 60's . RT stated to patient how concerned she was about his oxygenation and ask him to please let RT  place him on oxygen and he agreed. RT will continue to monitor

## 2016-07-02 NOTE — Progress Notes (Signed)
PT Cancellation Note  Patient Details Name: [name] MRN: [id number] DOB: 1965-02-09   Cancelled Treatment:    Reason Eval/Treat Not Completed: Checked back a 2nd time for PT tx session today. Pt declined due to not feeling well. Will check back another day. Thanks.    [name], MPT Pager: [phone]

## 2016-07-02 NOTE — Progress Notes (Signed)
Pt. Oxygen saturations sustained in the 80's once oxygen was applied by RRT. Pt. Pulled off trach collar and refused oxygen, stating it is giving him a really bad headache. RN called and notified by CCMD multiple times that pt. O2 sats are sustaining in the 50's and 60's. On call NP Schorr paged and made aware. Will continue to monitor pt. Closely.

## 2016-07-03 DIAGNOSIS — Z7189 Other specified counseling: Secondary | ICD-10-CM

## 2016-07-03 DIAGNOSIS — E662 Morbid (severe) obesity with alveolar hypoventilation: Secondary | ICD-10-CM

## 2016-07-03 DIAGNOSIS — E8770 Fluid overload, unspecified: Secondary | ICD-10-CM

## 2016-07-03 DIAGNOSIS — Z659 Problem related to unspecified psychosocial circumstances: Secondary | ICD-10-CM

## 2016-07-03 DIAGNOSIS — Z930 Tracheostomy status: Secondary | ICD-10-CM

## 2016-07-03 DIAGNOSIS — Z6841 Body Mass Index (BMI) 40.0 and over, adult: Secondary | ICD-10-CM

## 2016-07-03 DIAGNOSIS — J438 Other emphysema: Secondary | ICD-10-CM

## 2016-07-03 DIAGNOSIS — J9622 Acute and chronic respiratory failure with hypercapnia: Secondary | ICD-10-CM

## 2016-07-03 DIAGNOSIS — J9621 Acute and chronic respiratory failure with hypoxia: Secondary | ICD-10-CM

## 2016-07-03 DIAGNOSIS — Z794 Long term (current) use of insulin: Secondary | ICD-10-CM

## 2016-07-03 DIAGNOSIS — E119 Type 2 diabetes mellitus without complications: Secondary | ICD-10-CM

## 2016-07-03 DIAGNOSIS — E876 Hypokalemia: Secondary | ICD-10-CM

## 2016-07-03 LAB — BLOOD GAS, ARTERIAL
Acid-Base Excess: 12.1 mmol/L — ABNORMAL HIGH (ref 0.0–2.0)
BICARBONATE: 43.7 mmol/L — AB (ref 20.0–28.0)
DRAWN BY: 331471
FIO2: 35
O2 SAT: 92 %
PATIENT TEMPERATURE: 98.6
PO2 ART: 76.3 mmHg — AB (ref 83.0–108.0)
pCO2 arterial: 99.9 mmHg (ref 32.0–48.0)
pH, Arterial: 7.264 — ABNORMAL LOW (ref 7.350–7.450)

## 2016-07-03 LAB — COMPREHENSIVE METABOLIC PANEL
ALBUMIN: 3.8 g/dL (ref 3.5–5.0)
ALT: 33 U/L (ref 17–63)
AST: 22 U/L (ref 15–41)
Alkaline Phosphatase: 75 U/L (ref 38–126)
Anion gap: 11 (ref 5–15)
BUN: 26 mg/dL — AB (ref 6–20)
CHLORIDE: 88 mmol/L — AB (ref 101–111)
CO2: 39 mmol/L — ABNORMAL HIGH (ref 22–32)
CREATININE: 0.98 mg/dL (ref 0.61–1.24)
Calcium: 9.4 mg/dL (ref 8.9–10.3)
GFR calc Af Amer: >60 mL/min (ref >60)
GFR calc non Af Amer: >60 mL/min (ref >60)
GLUCOSE: 159 mg/dL — AB (ref 65–99)
POTASSIUM: 4 mmol/L (ref 3.5–5.1)
SODIUM: 138 mmol/L (ref 135–145)
Total Bilirubin: 1 mg/dL (ref 0.3–1.2)
Total Protein: 8.5 g/dL — ABNORMAL HIGH (ref 6.5–8.1)

## 2016-07-03 LAB — CBC WITH DIFFERENTIAL/PLATELET
BASOS ABS: 0 10*3/uL (ref 0.0–0.1)
BASOS PCT: 0 %
EOS PCT: 1 %
Eosinophils Absolute: 0.2 10*3/uL (ref 0.0–0.7)
HCT: 42.3 % (ref 39.0–52.0)
Hemoglobin: 14.5 g/dL (ref 13.0–17.0)
LYMPHS PCT: 24 %
Lymphs Abs: 5.2 10*3/uL — ABNORMAL HIGH (ref 0.7–4.0)
MCH: 26.3 pg (ref 26.0–34.0)
MCHC: 34.3 g/dL (ref 30.0–36.0)
MCV: 76.8 fL — ABNORMAL LOW (ref 78.0–100.0)
MONO ABS: 1.9 10*3/uL — AB (ref 0.1–1.0)
Monocytes Relative: 9 %
NEUTROS ABS: 14.4 10*3/uL — AB (ref 1.7–7.7)
Neutrophils Relative %: 66 %
Platelets: 337 10*3/uL (ref 150–400)
RBC: 5.51 MIL/uL (ref 4.22–5.81)
RDW: 16.1 % — AB (ref 11.5–15.5)
WBC: 21.8 10*3/uL — ABNORMAL HIGH (ref 4.0–10.5)

## 2016-07-03 LAB — PHOSPHORUS: Phosphorus: 4.8 mg/dL — ABNORMAL HIGH (ref 2.5–4.6)

## 2016-07-03 LAB — MRSA PCR SCREENING: MRSA BY PCR: NEGATIVE

## 2016-07-03 LAB — GLUCOSE, CAPILLARY
Glucose-Capillary: 143 mg/dL — ABNORMAL HIGH (ref 65–99)
Glucose-Capillary: 204 mg/dL — ABNORMAL HIGH (ref 65–99)
Glucose-Capillary: 141 mg/dL — ABNORMAL HIGH (ref 65–99)
Glucose-Capillary: 208 mg/dL — ABNORMAL HIGH (ref 65–99)

## 2016-07-03 LAB — MAGNESIUM: MAGNESIUM: 2 mg/dL (ref 1.7–2.4)

## 2016-07-03 NOTE — Progress Notes (Signed)
Report called to Harriette Bouillon in SDU.Alert at this time but falls asleep while in conversation as it has worsened thru the day

## 2016-07-03 NOTE — Progress Notes (Addendum)
Rt checked on pt. Pt requested neb tx. Neb tx given pt refused to ware 02. Pt stated he has a head ache. Pt claims 02 gives him and head ache. Sats 63% on room air. RN aware. No distress noted. Pt educated on 02.

## 2016-07-03 NOTE — Progress Notes (Signed)
During the 0400 round to check patients trach.... Patient had taken off his trach collar that was on medical air. I asked him if I could place the trach collar back on and he said no. Patients oxygenation remains in the 50's and 60's due to patient refusing oxygen . RT discussed the issue with the oxygen again but he refused. RT went to the rapid response nurse just to let them know that this situation was happening. RT will continue to monitor.
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
## 2016-07-03 NOTE — Progress Notes (Signed)
I agree with the assessment by the previous nurse. Hasnain Manheim RN  Pt observed sitting in chair very lethargic and drowsy. Pt refusing O2.

## 2016-07-03 NOTE — Progress Notes (Signed)
PT Cancellation Note  Patient Details Name: [name] MRN: [id number] DOB: [birth year]   Cancelled Treatment:    Reason Eval/Treat Not Completed: Medical issues which prohibited therapy. Noted low O2 saturation levels at rest and pt's refusal to wear O2. For safety reasons (sats too low to mobilize with pt), will hold PT for now.   [name], MPT Pager: [phone]

## 2016-07-03 NOTE — Progress Notes (Signed)
[place] Progress Note Patient Name: [name] DOB: 1965-07-02 MRN: [id number]   Date of Service  07/03/2016  HPI/Events of Note  Patient transferred from floor with altered mentation. Can recheck shows patient's nurse at bedside. Patient currently sitting up in chair on tracheostomy collar. Mildly increased work of breathing but overall appears stable. Attentive with eyes open and fully alert.   eICU Interventions  Intensivist to assess patient was available.      Intervention Category Evaluation Type: New Patient Evaluation  Tera Partridge 07/03/2016, 4:09 PM

## 2016-07-03 NOTE — Progress Notes (Signed)
Transported to SDU via bari chair A&Ox4

## 2016-07-03 NOTE — Progress Notes (Signed)
Patient brought from floor in chair, speaking in complete sentences, talking around trach.  Spoke with Noe Gens, NP, and decision was made to hold on changing trach from a cuffless to a cuffed and being placed on ventilator.  Placed on 40% trach collar and is currently tolerating well.

## 2016-07-03 NOTE — Progress Notes (Signed)
Rt not needed at this time per pt. No distress noted.

## 2016-07-04 DIAGNOSIS — J9601 Acute respiratory failure with hypoxia: Secondary | ICD-10-CM

## 2016-07-04 LAB — BASIC METABOLIC PANEL
Anion gap: 8 (ref 5–15)
Anion gap: 5 (ref 5–15)
BUN: 23 mg/dL — AB (ref 6–20)
BUN: 17 mg/dL (ref 6–20)
CALCIUM: 8.9 mg/dL (ref 8.9–10.3)
CALCIUM: 8.6 mg/dL — AB (ref 8.9–10.3)
CHLORIDE: 87 mmol/L — AB (ref 101–111)
CO2: 41 mmol/L — AB (ref 22–32)
CO2: 46 mmol/L — ABNORMAL HIGH (ref 22–32)
CREATININE: 0.78 mg/dL (ref 0.61–1.24)
CREATININE: 0.81 mg/dL (ref 0.61–1.24)
Chloride: 86 mmol/L — ABNORMAL LOW (ref 101–111)
GFR calc non Af Amer: >60 mL/min (ref >60)
Glucose, Bld: 177 mg/dL — ABNORMAL HIGH (ref 65–99)
Glucose, Bld: 125 mg/dL — ABNORMAL HIGH (ref 65–99)
Potassium: 4.5 mmol/L (ref 3.5–5.1)
Potassium: 4.4 mmol/L (ref 3.5–5.1)
SODIUM: 135 mmol/L (ref 135–145)
SODIUM: 138 mmol/L (ref 135–145)

## 2016-07-04 LAB — BLOOD GAS, ARTERIAL
Acid-Base Excess: 11.7 mmol/L — ABNORMAL HIGH (ref 0.0–2.0)
Acid-Base Excess: 17.4 mmol/L — ABNORMAL HIGH (ref 0.0–2.0)
BICARBONATE: 44.5 mmol/L — AB (ref 20.0–28.0)
Bicarbonate: 48 mmol/L — ABNORMAL HIGH (ref 20.0–28.0)
DRAWN BY: 441261
DRAWN BY: 441261
FIO2: 50
FIO2: 40
MECHVT: 530 mL
O2 SAT: 88.7 %
O2 Saturation: 97.6 %
PCO2 ART: 93.5 mmHg — AB (ref 32.0–48.0)
PEEP/CPAP: 5 cmH2O
PH ART: 7.236 — AB (ref 7.350–7.450)
PH ART: 7.331 — AB (ref 7.350–7.450)
PO2 ART: 59.2 mmHg — AB (ref 83.0–108.0)
Patient temperature: 98.6
Patient temperature: 98.6
RATE: 16 resp/min
pCO2 arterial: 109 mmHg (ref 32.0–48.0)
pO2, Arterial: 119 mmHg — ABNORMAL HIGH (ref 83.0–108.0)

## 2016-07-04 LAB — CBC WITH DIFFERENTIAL/PLATELET
Basophils Absolute: 0 10*3/uL (ref 0.0–0.1)
Basophils Relative: 0 %
EOS ABS: 0.1 10*3/uL (ref 0.0–0.7)
Eosinophils Relative: 1 %
HCT: 42.4 % (ref 39.0–52.0)
Hemoglobin: 14.4 g/dL (ref 13.0–17.0)
Lymphocytes Relative: 14 %
Lymphs Abs: 3.2 10*3/uL (ref 0.7–4.0)
MCH: 26.3 pg (ref 26.0–34.0)
MCHC: 34 g/dL (ref 30.0–36.0)
MCV: 77.4 fL — ABNORMAL LOW (ref 78.0–100.0)
MONO ABS: 1.8 10*3/uL — AB (ref 0.1–1.0)
MONOS PCT: 8 %
Neutro Abs: 17 10*3/uL — ABNORMAL HIGH (ref 1.7–7.7)
Neutrophils Relative %: 77 %
Platelets: 304 10*3/uL (ref 150–400)
RBC: 5.48 MIL/uL (ref 4.22–5.81)
RDW: 15.9 % — AB (ref 11.5–15.5)
WBC: 22.1 10*3/uL — ABNORMAL HIGH (ref 4.0–10.5)

## 2016-07-04 LAB — GLUCOSE, CAPILLARY
GLUCOSE-CAPILLARY: 155 mg/dL — AB (ref 65–99)
Glucose-Capillary: 173 mg/dL — ABNORMAL HIGH (ref 65–99)
Glucose-Capillary: 117 mg/dL — ABNORMAL HIGH (ref 65–99)
Glucose-Capillary: 134 mg/dL — ABNORMAL HIGH (ref 65–99)

## 2016-07-04 LAB — C-REACTIVE PROTEIN: CRP: 5.3 mg/dL — ABNORMAL HIGH (ref <1.0)

## 2016-07-04 LAB — SEDIMENTATION RATE: SED RATE: 32 mm/h — AB (ref 0–16)

## 2016-07-04 LAB — MAGNESIUM: MAGNESIUM: 1.9 mg/dL (ref 1.7–2.4)

## 2016-07-04 MED ORDER — ORAL CARE MOUTH RINSE
15.0000 mL | Freq: Four times a day (QID) | OROMUCOSAL | Status: DC
  Administered 2016-07-05 – 2016-07-07 (×4): 15 mL via OROMUCOSAL

## 2016-07-04 MED ORDER — METOCLOPRAMIDE HCL 5 MG/5ML PO SOLN
10.0000 mg | Freq: Once | ORAL | Status: AC
  Administered 2016-07-04: 10 mg via ORAL
  Filled 2016-07-04 (×2): qty 10

## 2016-07-04 MED ORDER — DIPHENHYDRAMINE HCL 50 MG/ML IJ SOLN
25.0000 mg | Freq: Once | INTRAMUSCULAR | Status: AC
  Administered 2016-07-04: 25 mg via INTRAVENOUS
  Filled 2016-07-04: qty 1

## 2016-07-04 MED ORDER — CHLORHEXIDINE GLUCONATE 0.12 % MT SOLN
15.0000 mL | Freq: Two times a day (BID) | OROMUCOSAL | Status: DC
  Administered 2016-07-05 – 2016-07-12 (×10): 15 mL via OROMUCOSAL
  Filled 2016-07-04 (×3): qty 15

## 2016-07-04 NOTE — Progress Notes (Signed)
CSW following to assist with d/c planning. PN reviewed. Pt now requesting Full Code status and in agreement with vent support. Clinicals sent to [facility] for review. Pt plans to return to ALF once stable / not requiring vent support. CSW will continue to follow to assist with d/c planning needs.  Werner Lean LCSW [phone]

## 2016-07-04 NOTE — Progress Notes (Signed)
Date: Jul 04, 2016 Discharge orders review for case management needs.  Ltach referral to both providers done. Velva Harman, BSN, [place], CCM: [phone]

## 2016-07-04 NOTE — Progress Notes (Signed)
Patient placed on ATC. Patient O2 sats 96% on 40%. No distress noted. Vent on standby at bedside. RT will continue continue to monitor patient.

## 2016-07-04 NOTE — Progress Notes (Signed)
   [facility] PCCM  > pt changed his mind and decided he wanted everything done.  He will be a full code.  I told him we need to change the trache as well as place him on the vent. He agrees.  If he refuses the trache and vent, we need to talk to him re: code status again.   I spent 30 minutes of critical care time with this patient today.    Monica Becton, MD 07/04/2016, 12:32 PM [place] [facility] Pager [phone] After 3 pm or if no answer, call [phone]

## 2016-07-04 NOTE — Progress Notes (Signed)
Date:  Jul 04, 2016  Chart reviewed for concurrent status and case management needs.  Will continue to follow patient progress.  Discharge Planning: following for needs  Expected discharge date: [id number]  Velva Harman, BSN, [place], [place]

## 2016-07-04 NOTE — Consult Note (Signed)
   [facility] CM Inpatient Consult   07/04/2016  Zeppelin GUHAN BRUINGTON 1965/11/23 [id number]    Patient screened for [place] Management program services. Chart reviewed. Noted he is from ALF. Currently in ICU. No identifiable [facility] Care Management needs at this time.    Marthenia Rolling, MSN-Ed, RN,BSN [facility] Liaison [phone]

## 2016-07-04 NOTE — Progress Notes (Signed)
Pt tolerating ATC well at this time, pt refusing ventilator QHS.  RN aware, RT to monitor and assess as needed.
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
## 2016-07-04 NOTE — Progress Notes (Signed)
PT Cancellation Note  Patient Details Name: [name] MRN: [id number] DOB: May 15, 1965   Cancelled Treatment:    Reason Eval/Treat Not Completed: Medical issues which prohibited therapy (placed on vent today. check back another time. )   Claretha Cooper 07/04/2016, 2:31 PM Tresa Endo PT [phone]

## 2016-07-05 DIAGNOSIS — Z515 Encounter for palliative care: Secondary | ICD-10-CM

## 2016-07-05 DIAGNOSIS — G4733 Obstructive sleep apnea (adult) (pediatric): Secondary | ICD-10-CM

## 2016-07-05 DIAGNOSIS — Z7189 Other specified counseling: Secondary | ICD-10-CM

## 2016-07-05 LAB — BASIC METABOLIC PANEL
Anion gap: 7 (ref 5–15)
BUN: 19 mg/dL (ref 6–20)
CALCIUM: 8.8 mg/dL — AB (ref 8.9–10.3)
CO2: 46 mmol/L — ABNORMAL HIGH (ref 22–32)
CREATININE: 0.87 mg/dL (ref 0.61–1.24)
Chloride: 84 mmol/L — ABNORMAL LOW (ref 101–111)
GFR calc Af Amer: >60 mL/min (ref >60)
GLUCOSE: 112 mg/dL — AB (ref 65–99)
Potassium: 3.7 mmol/L (ref 3.5–5.1)
SODIUM: 137 mmol/L (ref 135–145)

## 2016-07-05 LAB — GLUCOSE, CAPILLARY
GLUCOSE-CAPILLARY: 110 mg/dL — AB (ref 65–99)
GLUCOSE-CAPILLARY: 211 mg/dL — AB (ref 65–99)
Glucose-Capillary: 110 mg/dL — ABNORMAL HIGH (ref 65–99)
Glucose-Capillary: 166 mg/dL — ABNORMAL HIGH (ref 65–99)

## 2016-07-05 MED ORDER — KETOROLAC TROMETHAMINE 15 MG/ML IJ SOLN
7.5000 mg | Freq: Once | INTRAMUSCULAR | Status: AC
  Administered 2016-07-05: 7.5 mg via INTRAVENOUS
  Filled 2016-07-05: qty 1

## 2016-07-05 NOTE — Progress Notes (Signed)
Pt wanting to attempt VENT QHS tonight.  RT placed Pt on previous VENT settings and Pt became extremely anxious and stated that he couldn't breathe.  Pt requested RT to take him off the VENT, Pt placed back on 30% ATC and suctioned.  Pt states that he feels better at this time.  MD notified, RT to monitor and assess as needed.
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
## 2016-07-05 NOTE — Progress Notes (Signed)
Physical Therapy Treatment Patient Details Name: [name] MRN: [id number] DOB: 03/15/1965 Today's Date: 07/05/2016    History of Present Illness [name] is a 51 y.o. male with medical history significant of CHF, chronic ulcer of sacral region, COPD, daily smoker, type 2 diabetes, hypertension, sleep apnea, tracheostomy dependent recently admitted from 05/07-19/2018  for hypoxia , admitted 5/24 for  progressive/increased dyspnea     PT Comments    The patient is participatory today. Consented to wear  35% TC for ambulation. Lowest saturation 85% briefly, O/W >90%. Continue PT.   Follow Up Recommendations  No PT follow up     Equipment Recommendations  None recommended by PT    Recommendations for Other Services       Precautions / Restrictions Precautions Precautions: Fall Precaution Comments: trach collar    Mobility  Bed Mobility               General bed mobility comments: in recliner  Transfers Overall transfer level: Modified independent                  Ambulation/Gait Ambulation/Gait assistance: Supervision Ambulation Distance (Feet): 440 Feet Assistive device: Rolling walker (2 wheeled)       General Gait Details: 35% TC,  sats >85%-Briefly , otherwise >90%  RR 28-34. gait is steady with RW.   Stairs            Wheelchair Mobility    Modified Rankin (Stroke Patients Only)       Balance Overall balance assessment: Independent                                          Cognition Arousal/Alertness: Awake/alert                                            Exercises      General Comments        Pertinent Vitals/Pain Pain Assessment: No/denies pain    Home Living                      Prior Function            PT Goals (current goals can now be found in the care plan section) Progress towards PT goals: Progressing toward goals    Frequency    Min 3X/week       PT Plan Current plan remains appropriate;Frequency needs to be updated    Co-evaluation              AM-PAC PT "6 Clicks" Daily Activity  Outcome Measure  Difficulty turning over in bed (including adjusting bedclothes, sheets and blankets)?: None Difficulty moving from lying on back to sitting on the side of the bed? : None Difficulty sitting down on and standing up from a chair with arms (e.g., wheelchair, bedside commode, etc,.)?: None Help needed moving to and from a bed to chair (including a wheelchair)?: None Help needed walking in hospital room?: A Little Help needed climbing 3-5 steps with a railing? : A Lot 6 Click Score: 21    End of Session Equipment Utilized During Treatment: Oxygen Activity Tolerance: Patient tolerated treatment well Patient left: in chair;with call bell/phone within reach Nurse Communication: Mobility status PT Visit  Diagnosis: Difficulty in walking, not elsewhere classified (R26.2)     Time: 1410-1441 PT Time Calculation (min) (ACUTE ONLY): 31 min  Charges:  $Gait Training: 23-37 mins                    G CodesTresa Endo PT [id number]    Claretha Cooper 07/05/2016, 3:37 PM
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
## 2016-07-05 NOTE — Consult Note (Signed)
Consultation Note Date: 07/05/2016   Patient Name: [name]  DOB: 1965-09-14  MRN: [id number]  Age / Sex: 51 y.o., male  PCP: Boykin Nearing, MD Referring Physician: Florencia Reasons, MD  Reason for Consultation: Establishing goals of care  HPI/Patient Profile: 51 y.o. male  with past medical history of OSA/OHS, CHF, chronic trach admitted on 06/27/2016 with acute on chronic hypoxic respiratory failure, volume overload, obesity hypoventilation syndrome / OSA, COPD and diastolic CHF  .   Clinical Assessment and Goals of Care:  52 year old gentleman, resident of assisted living facility, admitted with shortness of breath, worsening edema, especially in both lower extremities. He was diuresed aggressively, there is concern about him not being on lasix, he developed acute on chronic hypercarbic resp failure, now in step down, has been refusing trach change, has been refusing to be on the vent. A palliative consult has been requested for goals of care discussions.   Mr Durfee is resting in a chair, he has eaten about 80% of his breakfast, I introduced myself and palliative care as follows: Palliative medicine is specialized medical care for people living with serious illness. It focuses on providing relief from the symptoms and stress of a serious illness. The goal is to improve quality of life for both the patient and the family.  Mr Rudin states he is feeling better, he states he came in for his swelling and not for his trach. We talked about the nature of his serious illness, his underlying morbidities, such as OSA/OHS, CHF. Goals and wishes discussed. When asked why he doesn't want to be on the vent at night, he states, " I hate being on machines."  I then discussed with him in detail about DNR DNI, how ever, at end of life, he wants "full code", when I explained to him that it entails, CPR, more machines, he becomes  frustrated, does not verbalize much, except to ask when he can go back to his assisted living facility, he does not allow for any more engagement or conversation after that, I have encouraged him to continue to think about his goals and wishes, while I was leaving, he remarked, " I was in good spirits until you came in." I thanked him for allowing me to come in and explain about his conditions and then left the room. See below   NEXT OF KIN  no spouse, no kids, has 2 friends who are listed as emergency contacts, has a cousin who lives in the area but we don't have his contact information. There is no designated HCPOA agent, patient does not wish to talk about designating a health care power of attorney in case he is not able to speak for himself in the future.   SUMMARY OF RECOMMENDATIONS    remains full code for now Eager to go back to his ALF Recommend palliative consult at ALF to continue goals of care conversations and to complete HCPOA paperwork.  No additional palliative recommendations at this time, see discussions above, thank you  for the consult.   Code Status/Advance Care Planning:  Full code    Symptom Management:      Palliative Prophylaxis:   Bowel Regimen  Additional Recommendations (Limitations, Scope, Preferences):  Full Scope Treatment  Psycho-social/Spiritual:   Desire for further Chaplaincy support:yes  Additional Recommendations: Caregiving  Support/Resources  Prognosis:   Unable to determine  Discharge Planning: wants to go back to his assisted living facility soon      Primary Diagnoses: Present on Admission: . Volume overload . Chronic ulcer of sacral region ([place]) . COPD (chronic obstructive pulmonary disease) ([place]) . Current smoker . Obesity hypoventilation syndrome ([place])   I have reviewed the medical record, interviewed the patient and family, and examined the patient. The following aspects are pertinent.  Past Medical History:    Diagnosis Date  . CHF (congestive heart failure) [facility]) Dx Oct 2012  . Chronic ulcer of sacral region ([place]) 2012  . COPD (chronic obstructive pulmonary disease) ([place]) Dx 2013  . Diabetes mellitus without complication [facility]) Dx Nov 2015  . Hypertension Dx Oct 2012  . Sleep apnea Dx Oct 2011  . Ventilator dependent [facility])    Social History   Social History  . Marital status: Single    Spouse name: N/A  . Number of children: N/A  . Years of education: N/A   Occupational History  . Disabled Unemployed   Social History Main Topics  . Smoking status: Current Every Day Smoker    Types: Cigarettes  . Smokeless tobacco: Never Used  . Alcohol use No  . Drug use: Yes    Types: Marijuana  . Sexual activity: Yes    Partners: Male   Other Topics Concern  . None   Social History Narrative   Single. Currently living in a condemned house.   Family History  Problem Relation Age of Onset  . Stomach cancer Brother   . Chronic Renal Failure Sister    Scheduled Meds: . aspirin EC  81 mg Oral Daily  . atorvastatin  20 mg Oral Daily  . carvedilol  6.25 mg Oral BID WC  . chlorhexidine  15 mL Mouth Rinse BID  . chlorhexidine gluconate (MEDLINE KIT)  15 mL Mouth Rinse BID  . enoxaparin (LOVENOX) injection  90 mg Subcutaneous Q24H  . furosemide  60 mg Intravenous TID  . insulin aspart  0-20 Units Subcutaneous TID WC  . mouth rinse  15 mL Mouth Rinse q12n4p  . mouth rinse  15 mL Mouth Rinse QID  . multivitamin with minerals  1 tablet Oral Daily  . potassium chloride SA  20 mEq Oral Daily  . protein supplement shake  11 oz Oral TID BM   Continuous Infusions: PRN Meds:.acetaminophen, albuterol, aspirin-acetaminophen-caffeine, ondansetron (ZOFRAN) IV, ondansetron, polyethylene glycol Medications Prior to Admission:  Prior to Admission medications   Medication Sig Start Date End Date Taking? Authorizing Provider  ACCU-CHEK FASTCLIX LANCETS MISC 1 each by Does not apply route 3 (three)  times daily. E11.9 05/02/14   Funches, Adriana Mccallum, MD  albuterol (PROVENTIL) (2.5 MG/3ML) 0.083% nebulizer solution Take 3 mLs (2.5 mg total) by nebulization every 6 (six) hours as needed for wheezing or shortness of breath. 03/10/15   Boykin Nearing, MD  aspirin EC 81 MG EC tablet Take 1 tablet (81 mg total) by mouth daily. 06/22/16   Isaac Bliss, Rayford Halsted, MD  atorvastatin (LIPITOR) 20 MG tablet Take 1 tablet (20 mg total) by mouth daily. 05/05/14   Boykin Nearing, MD  Blood Glucose  Monitoring Suppl (ACCU-CHEK NANO SMARTVIEW) W/DEVICE KIT 1 Device by Does not apply route as needed. E 11.9 05/02/14   Funches, Josalyn, MD  carvedilol (COREG) 6.25 MG tablet Take 1 tablet (6.25 mg total) by mouth 2 (two) times daily with a meal. 06/21/16   Isaac Bliss, Rayford Halsted, MD  furosemide (LASIX) 40 MG tablet Take 1.5 tablets (60 mg total) by mouth 2 (two) times daily. Take 1 tab in the morning and 1 tab at 2 pm 06/21/16   Isaac Bliss, Rayford Halsted, MD  Multiple Vitamin (MULTIVITAMIN WITH MINERALS) TABS tablet Take 1 tablet by mouth daily. 06/22/16   Isaac Bliss, Rayford Halsted, MD  polyethylene glycol [facility] / Floria Raveling) packet Take 17 g by mouth 2 (two) times daily as needed for mild constipation. Reported on 08/03/2015    [provider]  potassium chloride SA (K-DUR,KLOR-CON) 20 MEQ tablet Take 1 tablet (20 mEq total) by mouth daily. 08/04/14   Dorothy Spark, MD  sitaGLIPtin (JANUVIA) 100 MG tablet Take 1 tablet (100 mg total) by mouth daily. 06/21/16   Isaac Bliss, Rayford Halsted, MD   Allergies  Allergen Reactions  . Tramadol Anaphylaxis, Swelling and Other (See Comments)    Reaction:  Tongue swelling   . Ibuprofen Swelling and Other (See Comments)    Reaction:  Tongue swelling   . Robaxin [Methocarbamol] Other (See Comments)    Reaction:  GI bleeding    Review of Systems Denies chest pain Denies dyspnea currently  Physical Exam General: morbidly obese male in NAD, awake, sitting up  in chair  HEENT: has trach site clean and dry   PSY: gets frustrated during conversations.  Neuro: AAOx4, speaks around trach, oriented / clear CV: s1s2 rrr,   PULM: no distress currently.  RJ:JOAC, non-tender Extremities: warm/dry, 2+ BLE pitting edema     Vital Signs: BP 131/69   Pulse 73   Temp 97.9 F (36.6 C) (Oral)   Resp (!) 22   Ht 5' 7"  (1.702 m)   Wt (!) 183.7 kg (404 lb 15.8 oz)   SpO2 93%   BMI 63.43 kg/m  Pain Assessment: 0-10 POSS *See Group Information*: 2-Acceptable,Slightly drowsy, easily aroused Pain Score: Asleep   SpO2: SpO2: 93 % O2 Device:SpO2: 93 % O2 Flow Rate: .O2 Flow Rate (L/min): 10 L/min  IO: Intake/output summary:  Intake/Output Summary (Last 24 hours) at 07/05/16 1212 Last data filed at 07/05/16 1000  Gross per 24 hour  Intake              120 ml  Output             3275 ml  Net            -3155 ml    LBM: Last BM Date: 07/02/16 Baseline Weight: Weight: (!) 192.9 kg (425 lb 3.2 oz) Most recent weight: Weight: (!) 183.7 kg (404 lb 15.8 oz)     Palliative Assessment/Data:   Flowsheet Rows     Most Recent Value  Intake Tab  Referral Department  Hospitalist  Unit at Time of Referral  Intermediate Care Unit  Palliative Care Primary Diagnosis  Pulmonary  Date Notified  07/05/16  Palliative Care Type  New Palliative care  Reason for referral  Clarify Goals of Care  Date of Admission  06/27/16  Date first seen by Palliative Care  07/05/16  # of days IP prior to Palliative referral  8  Clinical Assessment  Palliative Performance Scale Score  40%  Pain  Max last 24 hours  1  Pain Min Last 24 hours  0  Dyspnea Max Last 24 Hours  2  Dyspnea Min Last 24 hours  1  Psychosocial & Spiritual Assessment  Palliative Care Outcomes  Patient/Family meeting held?  Yes  Who was at the meeting?  patient who is decisional.   Palliative Care Outcomes  Clarified goals of care      Time In:  11 Time Out:  12 Time Total:   60 min  Greater than  50%  of this time was spent counseling and coordinating care related to the above assessment and plan.  Signed by: Loistine Chance, MD  [phone]  Please contact Palliative Medicine Team phone at [phone] for questions and concerns.  For individual provider: See Shea Evans

## 2016-07-05 NOTE — Progress Notes (Signed)
Patient comfortable at this time. Patient O2 sats on 35% are 70-80%. CCM aware of desats during resting periods. Patient refusing O2 even though he and vent unless he specifies that he feels that he needs it. NO distress otherwise noted. RT will continue to monitor patient.

## 2016-07-06 LAB — GLUCOSE, CAPILLARY
GLUCOSE-CAPILLARY: 218 mg/dL — AB (ref 65–99)
GLUCOSE-CAPILLARY: 213 mg/dL — AB (ref 65–99)
Glucose-Capillary: 194 mg/dL — ABNORMAL HIGH (ref 65–99)
Glucose-Capillary: 90 mg/dL (ref 65–99)

## 2016-07-06 LAB — BASIC METABOLIC PANEL
Anion gap: 10 (ref 5–15)
BUN: 29 mg/dL — AB (ref 6–20)
CO2: 44 mmol/L — ABNORMAL HIGH (ref 22–32)
Calcium: 8.8 mg/dL — ABNORMAL LOW (ref 8.9–10.3)
Chloride: 84 mmol/L — ABNORMAL LOW (ref 101–111)
Creatinine, Ser: 0.92 mg/dL (ref 0.61–1.24)
GFR calc Af Amer: >60 mL/min (ref >60)
GFR calc non Af Amer: >60 mL/min (ref >60)
Glucose, Bld: 142 mg/dL — ABNORMAL HIGH (ref 65–99)
POTASSIUM: 3.5 mmol/L (ref 3.5–5.1)
SODIUM: 138 mmol/L (ref 135–145)

## 2016-07-06 MED ORDER — HYDROXYZINE HCL 25 MG PO TABS: 25.0000 mg | ORAL_TABLET | Freq: Every day | ORAL | Status: DC

## 2016-07-06 MED ORDER — HYDROXYZINE HCL 25 MG PO TABS
50.0000 mg | ORAL_TABLET | Freq: Every day | ORAL | Status: DC
  Administered 2016-07-06: 50 mg via ORAL
  Filled 2016-07-06: qty 2

## 2016-07-06 MED ORDER — KETOROLAC TROMETHAMINE 15 MG/ML IJ SOLN
15.0000 mg | Freq: Once | INTRAMUSCULAR | Status: AC
  Administered 2016-07-06: 15 mg via INTRAVENOUS
  Filled 2016-07-06: qty 1

## 2016-07-06 NOTE — Clinical Social Work Note (Signed)
CSW spoke with Dr. Erlinda Hong about patient. He is now agreeable to having vent placement at night and is also agreeable to referral to LTACS for continued care.  MD states he is now cooperative and no behaviors noted.  Spoke with Edwin Cap, weekend [facility] re: above.  Patient had asked MD if he could be allowed to leave the LTAC to go to church once a week.  SW is unsure of this but it is doubtful that this could be done; patient's church family could visit him at the [facility] however.  This was mentioned to [facility] and asked her to add question to [facility] referral.  Lorie Phenix. Pauline Good, [place]  (weekend coverage)
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
## 2016-07-06 NOTE — Care Management Note (Signed)
Case Management Note  Patient Details  Name: [name] MRN: [id number] Date of Birth: 1965/09/21  Subjective/Objective:    Volume Overload, Obesity Hypoventilation Syndrome, DM, COPD, ulcer on sacral area                Action/Plan: Discharge Planning: NCM spoke to pt and family at bedside. Offered choice for LTAC. Pt requested [facility]. NCM contacted [facility] rep with referral.   PCP Boykin Nearing   Expected Discharge Date:                Expected Discharge Plan:  Long Term Acute Care (LTAC)  In-House Referral:  Clinical Social Work  Discharge planning Services  CM Consult  Post Acute Care Choice:  NA Choice offered to:  NA  DME Arranged:  N/A DME Agency:  NA  HH Arranged:  NA HH Agency:  NA  Status of Service:  Completed, signed off  If discussed at [employer] of Stay Meetings, dates discussed:    Additional Comments:  Erenest Rasher, RN 07/06/2016, 1:50 PM

## 2016-07-07 LAB — GLUCOSE, CAPILLARY
GLUCOSE-CAPILLARY: 177 mg/dL — AB (ref 65–99)
GLUCOSE-CAPILLARY: 178 mg/dL — AB (ref 65–99)
Glucose-Capillary: 150 mg/dL — ABNORMAL HIGH (ref 65–99)
Glucose-Capillary: 166 mg/dL — ABNORMAL HIGH (ref 65–99)

## 2016-07-07 LAB — CBC WITH DIFFERENTIAL/PLATELET
BASOS ABS: 0 10*3/uL (ref 0.0–0.1)
BASOS PCT: 0 %
EOS ABS: 0.3 10*3/uL (ref 0.0–0.7)
Eosinophils Relative: 2 %
HCT: 40.7 % (ref 39.0–52.0)
Hemoglobin: 13.8 g/dL (ref 13.0–17.0)
Lymphocytes Relative: 27 %
Lymphs Abs: 4.5 10*3/uL — ABNORMAL HIGH (ref 0.7–4.0)
MCH: 25.9 pg — ABNORMAL LOW (ref 26.0–34.0)
MCHC: 33.9 g/dL (ref 30.0–36.0)
MCV: 76.4 fL — ABNORMAL LOW (ref 78.0–100.0)
MONO ABS: 1.5 10*3/uL — AB (ref 0.1–1.0)
Monocytes Relative: 9 %
NEUTROS PCT: 62 %
Neutro Abs: 10.2 10*3/uL — ABNORMAL HIGH (ref 1.7–7.7)
Platelets: 286 10*3/uL (ref 150–400)
RBC: 5.33 MIL/uL (ref 4.22–5.81)
RDW: 15.7 % — AB (ref 11.5–15.5)
WBC: 16.5 10*3/uL — ABNORMAL HIGH (ref 4.0–10.5)

## 2016-07-07 LAB — BASIC METABOLIC PANEL
Anion gap: 13 (ref 5–15)
BUN: 23 mg/dL — ABNORMAL HIGH (ref 6–20)
CALCIUM: 9.2 mg/dL (ref 8.9–10.3)
CO2: 41 mmol/L — AB (ref 22–32)
Chloride: 84 mmol/L — ABNORMAL LOW (ref 101–111)
Creatinine, Ser: 0.81 mg/dL (ref 0.61–1.24)
GLUCOSE: 158 mg/dL — AB (ref 65–99)
Potassium: 3.3 mmol/L — ABNORMAL LOW (ref 3.5–5.1)
Sodium: 138 mmol/L (ref 135–145)

## 2016-07-07 LAB — MAGNESIUM: MAGNESIUM: 2 mg/dL (ref 1.7–2.4)

## 2016-07-07 LAB — TSH: TSH: 4.292 u[IU]/mL (ref 0.350–4.500)

## 2016-07-07 MED ORDER — HYDROXYZINE HCL 25 MG PO TABS
50.0000 mg | ORAL_TABLET | Freq: Every day | ORAL | Status: DC
  Administered 2016-07-07 – 2016-07-08 (×2): 50 mg via ORAL
  Filled 2016-07-07 (×2): qty 2

## 2016-07-07 MED ORDER — POTASSIUM CHLORIDE CRYS ER 20 MEQ PO TBCR
40.0000 meq | EXTENDED_RELEASE_TABLET | Freq: Once | ORAL | Status: AC
  Administered 2016-07-07: 40 meq via ORAL
  Filled 2016-07-07: qty 2

## 2016-07-07 NOTE — Progress Notes (Signed)
Pt refuses to be in bed when sleeping, and wants to sleep in chair. Nurses have repeatedly lifted foot of chair to prevent potential falling out of chair when asleep, however Pt continuous to put down foot of chair after safety education. Will continue to monitor and educate.

## 2016-07-07 NOTE — Care Management Important Message (Signed)
Important Message  Patient Details  Name: KO BARDON MRN: [id number] Date of Birth: March 22, 1965   Medicare Important Message Given:  Yes    Erenest Rasher, RN 07/07/2016, 11:38 AM

## 2016-07-07 NOTE — Progress Notes (Signed)
RN walked into room, Pt was leaning over in chair coughing. Pt removed himself from VENT and refused to let RN place VENT back on. Respiratory was called, PT now on trach collar. 02 SATS stable, currently sitting in chair comfortably. Will continue to monitor.

## 2016-07-07 NOTE — Progress Notes (Signed)
NCM received call from [facility] and they did not accept referral. NCM informed attending. Plan is for dc to [facility] or back to his ALF. Explained to pt that he will need to make a decision. Pt states he wanted to discuss with his family and will get back with NCM on decision. [facility] has accepted referral. Jonnie Finner RN CCM Case Mgmt phone [phone]

## 2016-07-07 NOTE — Progress Notes (Signed)
RT returned at 2230 to place pt on vent for the night as asked by pt.  RT entered and found pt asleep in the chair.  RT aroused pt and explained we were going to put him on the vent for the night and he said ok.  RT placed vent circuit on pt's trach and before RT turned to check volumes and inflate the cuff, pt said to take it off.  Pt is adamant about not wearing the vent.  RT has placed ATC back on pt at 12LPM and 50%.  Rt charted trach check at time of visit to avoid returning in 20 minutes to do it again.  Pt continues to complain of staff and trusting anyone while he is on vent.  Rt tried to console pt explaining he would be fine, but pt insists he will not wear vent.  RT will monitor. RT and RN have discussed care for the night, assuming ATC will be our only option at this time.

## 2016-07-07 NOTE — Progress Notes (Signed)
Patient refusing to be on ventilator at night, despite education by RN and RT. Patient addiment about sleeping in chair, and tends to lean forward in chair when asleep.  RN educated Patient on potential fall risk. RN advised Patient to raise feet and sit back in chair, but Patient refuses to do so. Will continue to monitor.
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
## 2016-07-07 NOTE — Progress Notes (Signed)
Pt demanding to be removed from VENT at this time.  RT placed pt on 40% ATC, Pt tolerating well at this time.  RT to monitor and assess as needed.

## 2016-07-08 LAB — GLUCOSE, CAPILLARY
GLUCOSE-CAPILLARY: 160 mg/dL — AB (ref 65–99)
GLUCOSE-CAPILLARY: 136 mg/dL — AB (ref 65–99)
GLUCOSE-CAPILLARY: 169 mg/dL — AB (ref 65–99)
Glucose-Capillary: 241 mg/dL — ABNORMAL HIGH (ref 65–99)

## 2016-07-08 LAB — BASIC METABOLIC PANEL
ANION GAP: 10 (ref 5–15)
BUN: 22 mg/dL — ABNORMAL HIGH (ref 6–20)
CHLORIDE: 86 mmol/L — AB (ref 101–111)
CO2: 42 mmol/L — AB (ref 22–32)
Calcium: 9.1 mg/dL (ref 8.9–10.3)
Creatinine, Ser: 0.8 mg/dL (ref 0.61–1.24)
GFR calc non Af Amer: >60 mL/min (ref >60)
GLUCOSE: 144 mg/dL — AB (ref 65–99)
Potassium: 3.6 mmol/L (ref 3.5–5.1)
Sodium: 138 mmol/L (ref 135–145)

## 2016-07-08 LAB — MAGNESIUM: Magnesium: 1.9 mg/dL (ref 1.7–2.4)

## 2016-07-08 MED ORDER — FUROSEMIDE 20 MG PO TABS: 60.0000 mg | ORAL_TABLET | Freq: Three times a day (TID) | ORAL | 11 refills | Status: DC

## 2016-07-08 MED ORDER — PREMIER PROTEIN SHAKE
11.0000 [oz_av] | Freq: Two times a day (BID) | ORAL | Status: DC
  Administered 2016-07-08 – 2016-07-09 (×3): 11 [oz_av] via ORAL
  Filled 2016-07-08 (×8): qty 325.31

## 2016-07-08 MED ORDER — POTASSIUM CHLORIDE CRYS ER 20 MEQ PO TBCR
40.0000 meq | EXTENDED_RELEASE_TABLET | Freq: Once | ORAL | Status: AC
  Administered 2016-07-08: 40 meq via ORAL
  Filled 2016-07-08: qty 2

## 2016-07-08 MED ORDER — UNJURY CHICKEN SOUP POWDER
8.0000 [oz_av] | Freq: Every day | ORAL | Status: DC
  Administered 2016-07-08 – 2016-07-11 (×4): 8 [oz_av] via ORAL
  Filled 2016-07-08 (×4): qty 27

## 2016-07-08 MED ORDER — METFORMIN HCL 500 MG PO TABS: 500.0000 mg | ORAL_TABLET | Freq: Two times a day (BID) | ORAL | 11 refills | Status: DC

## 2016-07-08 MED ORDER — METFORMIN HCL 500 MG PO TABS
1000.0000 mg | ORAL_TABLET | Freq: Two times a day (BID) | ORAL | 11 refills | Status: AC
Start: 2016-07-08 — End: 2017-07-08

## 2016-07-08 MED FILL — ?METFORMIN HCL 500MG TABLET: 500 | 30 days supply | Qty: 60 | Fill #0

## 2016-07-08 NOTE — Progress Notes (Signed)
CSW spoke with Ms. Foreman from ALF this afternoon. She has reported that she will not be screening pt today after all. Ms. Pamella Pert will see pt tomorrow and deliver DC notice to him. Ms. Pamella Pert reports that they are unable to meet his needs and he cannot return to ALF. CSW has explained this to pt. Pt is tearful, disappointed. Support provided. Pt has given CSW permission to initiate SNF search today. Bed offers will be provided as received. MD / NSG has been updated. Pt will transfer to MED / SURG.  Werner Lean LCSW [phone]

## 2016-07-08 NOTE — Progress Notes (Signed)
Physical Therapy Treatment Patient Details Name: [name] MRN: [id number] DOB: [birth year] Today's Date: 07/08/2016    History of Present Illness Gamal NIV DARLEY is a 51 y.o. male with medical history significant of CHF, chronic ulcer of sacral region, COPD, daily smoker, type 2 diabetes, hypertension, sleep apnea, tracheostomy dependent recently admitted from 05/07-19/2018  for hypoxia , admitted 5/24 for  progressive/increased dyspnea     PT Comments    Pt progressing well with his mobility.  At first pt stated he didn't need any oxygen and required redirection/education on need esp for activity.  Pt complied. Tolerated a great distance     Follow Up Recommendations  No PT follow up     Equipment Recommendations  None recommended by PT    Recommendations for Other Services       Precautions / Restrictions Precautions Precautions: Fall Precaution Comments: trach collar Restrictions Weight Bearing Restrictions: No    Mobility  Bed Mobility               General bed mobility comments: in recliner  Transfers Overall transfer level: Modified independent Equipment used: None;Rolling walker (2 wheeled)             General transfer comment: good use of hands to steady self and good safety awarness (lines)  Ambulation/Gait Ambulation/Gait assistance: Supervision Ambulation Distance (Feet): 485 Feet Assistive device: Rolling walker (2 wheeled) (Bariatric RW) Gait Pattern/deviations: Step-through pattern;Decreased stride length Gait velocity: WFL   General Gait Details: 35% TC,  sats >88%-Briefly , otherwise >90%  RR 28-34. gait is steady with RW.  No c/o    Financial trader Rankin (Stroke Patients Only)       Balance                                            Cognition Arousal/Alertness: Awake/alert Behavior During Therapy: WFL for tasks assessed/performed Overall Cognitive Status: Within  Functional Limits for tasks assessed                                        Exercises      General Comments        Pertinent Vitals/Pain Pain Assessment: No/denies pain    Home Living                      Prior Function            PT Goals (current goals can now be found in the care plan section) Acute Rehab PT Goals Patient Stated Goal: wants to be independent Progress towards PT goals: Progressing toward goals    Frequency    Min 3X/week      PT Plan Current plan remains appropriate    Co-evaluation              AM-PAC PT "6 Clicks" Daily Activity  Outcome Measure  Difficulty turning over in bed (including adjusting bedclothes, sheets and blankets)?: None Difficulty moving from lying on back to sitting on the side of the bed? : None Difficulty sitting down on and standing up from a chair with arms (e.g., wheelchair, bedside commode, etc,.)?: None Help needed moving to and from  a bed to chair (including a wheelchair)?: None Help needed walking in hospital room?: A Little Help needed climbing 3-5 steps with a railing? : A Lot 6 Click Score: 21    End of Session Equipment Utilized During Treatment: Oxygen Activity Tolerance: Patient tolerated treatment well Patient left: in chair;with call bell/phone within reach Nurse Communication: Mobility status PT Visit Diagnosis: Difficulty in walking, not elsewhere classified (R26.2)     Time: 2909-0301 PT Time Calculation (min) (ACUTE ONLY): 32 min  Charges:  $Gait Training: 8-22 mins $Therapeutic Activity: 8-22 mins                    G Codes:       Rica Koyanagi  PTA [facility]  Acute  Rehab Pager      [phone]

## 2016-07-08 NOTE — NC FL2 (Signed)
[place] LEVEL OF CARE SCREENING TOOL     IDENTIFICATION  Patient Name: [name] Birthdate: 02/28/1965 Sex: male Admission Date (Current Location): 06/27/2016  [facility] and [place] Number:  Herbalist and Address:  [facility],  [place] [address], [place]      Provider Number: [id number]  Attending Physician Name and Address:  Florencia Reasons, MD  Relative Name and Phone Number:       Current Level of Care: Hospital Recommended Level of Care: [place] Prior Approval Number:    Date Approved/Denied:   PASRR Number: [id number]  Discharge Plan: SNF    Current Diagnoses: Patient Active Problem List   Diagnosis Date Noted  . OSA (obstructive sleep apnea)   . Encounter for palliative care   . Goals of care, counseling/discussion   . Acute hypoxemic respiratory failure ([place])   . Volume overload 06/28/2016  . Pressure injury of skin 06/15/2016  . SOB (shortness of breath) 06/10/2016  . Pain of left hip joint 11/21/2015  . Bilateral chronic knee pain 07/25/2015  . Tracheostomy dependent ([place]) 03/10/2015  . COPD (chronic obstructive pulmonary disease) ([place])   . Essential hypertension 01/20/2015  . DOE (dyspnea on exertion) 01/20/2015  . Toenail fungus 07/12/2014  . Acute on chronic diastolic CHF (congestive heart failure), NYHA class 1 ([place]) 06/15/2014  . Hyperlipidemia associated with type 2 diabetes mellitus ([place]) 05/05/2014  . Cardiomegaly 05/02/2014  . Nasal congestion 05/02/2014  . Current smoker 05/02/2014  . Severe obesity (BMI >= 40) ([place]) 11/28/2013  . Type II diabetes mellitus ([place]) 11/28/2013  . Left anterior fascicular block 11/27/2013  . Poor social situation 11/27/2013  . Monoclonal gammopathy 12/14/2010  . Decubitus ulcer of sacral area 12/10/2010  . Obesity hypoventilation syndrome ([place]) 12/09/2010  . Chronic ulcer of sacral region ([place]) 02/04/2010    Orientation RESPIRATION BLADDER Height &  Weight     Self, Time, Situation, Place  Tracheostomy Continent Weight: (!) 404 lb 15.8 oz (183.7 kg) Height:  5' 7"  (170.2 cm)  BEHAVIORAL SYMPTOMS/MOOD NEUROLOGICAL BOWEL NUTRITION STATUS  Other (Comment) (Pt can be uncooperative with care. )   Continent Diet (Heart Healthy)  AMBULATORY STATUS COMMUNICATION OF NEEDS Skin   Supervision (Pt is ambulating over 400 ft with supervision.) Verbally Other (Comment) (Pt has a Stage 4 pressure ulcer on sacrum . Twice daily dressing change recommended.)                       Personal Care Assistance Level of Assistance  Bathing, Feeding, Dressing Bathing Assistance: Limited assistance Feeding assistance: Independent Dressing Assistance: Limited assistance     Functional Limitations Info  Sight, Hearing, Speech Sight Info: Adequate Hearing Info: Adequate Speech Info: Adequate    SPECIAL CARE FACTORS FREQUENCY                       Contractures Contractures Info: Not present    Additional Factors Info  Code Status Code Status Info: DNR Allergies Info: TRAMADOL, ROBAXIN, IBUPROFEN           Current Medications (07/08/2016):  This is the current hospital active medication list Current Facility-Administered Medications  Medication Dose Route Frequency Provider Last Rate Last Dose  . acetaminophen (TYLENOL) tablet 650 mg  650 mg Oral Q6H PRN Raiford Noble [place], DO   650 mg at 07/08/16 1008  . albuterol (PROVENTIL) (2.5 MG/3ML) 0.083% nebulizer solution 2.5 mg  2.5  mg Nebulization Q4H PRN Reubin Milan, MD   2.5 mg at 07/06/16 2303  . aspirin EC tablet 81 mg  81 mg Oral Daily Reubin Milan, MD   81 mg at 07/08/16 1008  . aspirin-acetaminophen-caffeine (EXCEDRIN MIGRAINE) per tablet 1 tablet  1 tablet Oral Q8H PRN Raiford Noble [place], DO   1 tablet at 07/05/16 0045  . atorvastatin (LIPITOR) tablet 20 mg  20 mg Oral Daily Reubin Milan, MD   20 mg at 07/08/16 1008  . carvedilol (COREG) tablet 6.25 mg  6.25 mg  Oral BID WC Sheikh, Georgina Quint [place], DO   6.25 mg at 07/08/16 0820  . chlorhexidine (PERIDEX) 0.12 % solution 15 mL  15 mL Mouth Rinse BID Raiford Noble [place], DO   15 mL at 07/08/16 1008  . chlorhexidine gluconate (MEDLINE KIT) (PERIDEX) 0.12 % solution 15 mL  15 mL Mouth Rinse BID de Dios, [place] A, MD   15 mL at 07/08/16 0315  . enoxaparin (LOVENOX) injection 90 mg  90 mg Subcutaneous Q24H SheikhGeorgina Quint [place], DO   90 mg at 07/08/16 1004  . furosemide (LASIX) injection 60 mg  60 mg Intravenous TID Raiford Noble Latif, DO   60 mg at 07/08/16 1009  . hydrOXYzine (ATARAX/VISTARIL) tablet 50 mg  50 mg Oral QHS Florencia Reasons, MD   50 mg at 07/07/16 2202  . insulin aspart (novoLOG) injection 0-20 Units  0-20 Units Subcutaneous TID WC Reubin Milan, MD   7 Units at 07/08/16 1234  . MEDLINE mouth rinse  15 mL Mouth Rinse q12n4p Raiford Noble [place], DO   15 mL at 07/08/16 1228  . multivitamin with minerals tablet 1 tablet  1 tablet Oral Daily Reubin Milan, MD   1 tablet at 07/08/16 1008  . ondansetron (ZOFRAN) injection 4 mg  4 mg Intravenous Q6H PRN Raiford Noble [place], DO   4 mg at 07/03/16 2145  . ondansetron (ZOFRAN) tablet 4 mg  4 mg Oral Q6H PRN Sheikh, Georgina Quint Latif, DO      . polyethylene glycol (MIRALAX / GLYCOLAX) packet 17 g  17 g Oral BID PRN Reubin Milan, MD   17 g at 07/05/16 0912  . potassium chloride SA (K-DUR,KLOR-CON) CR tablet 20 mEq  20 mEq Oral Daily Reubin Milan, MD   20 mEq at 07/08/16 1008  . protein supplement (PREMIER PROTEIN) liquid  11 oz Oral BID BM Florencia Reasons, MD   11 oz at 07/08/16 1228  . protein supplement (UNJURY CHICKEN SOUP) powder 8 oz  8 oz Oral Daily Florencia Reasons, MD   8 oz at 07/08/16 1227     Discharge Medications: Please see discharge summary for a list of discharge medications.  Relevant Imaging Results:  Relevant Lab Results:   Additional Information SS# [id number]  Haidinger, Randall An, LCSW

## 2016-07-08 NOTE — Progress Notes (Signed)
Nutrition Follow-up  DOCUMENTATION CODES:   Morbid obesity  INTERVENTION:  - Will decrease Premier Protein to BID. - Will trial Unjury Chicken Soup once/day, each 8 ounce serving provides 100 kcal and 21 grams of protein.  - Continue to encourage intakes of protein-rich foods and will monitor for ability/need to educate on this for when he discharges.   NUTRITION DIAGNOSIS:   Increased nutrient needs (protein) related to wound healing (chronic stage 4 sacral wound) as evidenced by estimated needs. -ongoing  GOAL:   Patient will meet greater than or equal to 90% of their needs -met  MONITOR:   PO intake, Supplement acceptance, Weight trends, Labs, Skin  ASSESSMENT:   51 y.o. male with medical history significant of CHF, chronic ulcer of sacral region, COPD, daily smoker, type 2 diabetes, hypertension, sleep apnea, tracheostomy dependent recently admitted from 06/10/2016 and 2 06/22/2016 for hypoxia and volume overload who is coming to the emergency department via EMS with complaints of progressively worse dyspnea with minimal exertion for about a week, associated with lower extremity and abdomen edema. He mentions that he did not take one of his furosemide doses, but is documented by EGD provider that he may have missed several doses.  6/4 Pt continues with 100% completion of meals and is accepting Premier Protein ~50% of the time. Will change supplement orders as outlined above to continue to aid in meeting estimating protein need associated with wound healing. Weight trending down with diuretics and is now -21 lbs since admission. Pt sleeping soundly at this time with no family/visitors present.  Medications reviewed; 60 mg IV Lasix TID, sliding scale Novolog, daily multivitamin with minerals, 20 mEq oral KCl/day, 40 mEq oral KCl x1 dose yesterday. Labs reviewed; CBG: 160 mg/dL, Cl: 86 mmol/L, BUN: 22 mg/dL.    5/28 - Pt with 100% of all meals 5/26 and breakfast yesterday, per  flowsheet.  - Visualized lunch tray with 100% completion. - Pt reports that MD informed him that he would talk with RD about allowing double protein portions for meals; this is appropriate given chronic wound for which MD note states possible consult to Surgery. - Will order double protein and protein shakes to aid in meeting protein needs.  - Pt reports that PTA he was at ALF and that he was unable to control meals or request double protein portions.  - He is hopeful that after d/c he will be able to get his own apartment and make his own food choices.  - Will monitor for need for diet educations prior to d/c dependent on plan for living area following d/c.   Physical assessment shows no muscle or fat wasting; moderate and severe edema noted. Per chart review, current weight consistent with weight from 06/21/16 and +18 lbs since 11/30/15.   Diet Order:  Diet heart healthy/carb modified Room service appropriate? Yes; Fluid consistency: Thin; Fluid restriction: 1500 mL Fluid  Skin:    Stage 4 sacral pressure injury  Last BM:  6/4  Height:   Ht Readings from Last 1 Encounters:  07/04/16 5' 7"  (1.702 m)    Weight:   Wt Readings from Last 1 Encounters:  07/03/16 (!) 404 lb 15.8 oz (183.7 kg)    Ideal Body Weight:  67.27 kg  BMI:  Body mass index is 63.43 kg/m.  Estimated Nutritional Needs:   Kcal:  0814-4818 (12-14 kcal/kg)  Protein:  >/= 148 grams (2.2 grams/kg IBW)  Fluid:  >/= 2 L/day  EDUCATION NEEDS:   Education  needs no appropriate at this time     Jarome Matin, MS, RD, LDN, [facility] Inpatient Clinical Dietitian Pager # [phone] After hours/weekend pager # [phone]

## 2016-07-08 NOTE — Progress Notes (Signed)
CSW has not heard back from [facility] regarding screening. FL2 and DC Summary has been sent for review. CSW has contacted ALF again to report clinicals have been sent for review. Awaiting return call with placement decision.  Werner Lean LCSW [phone]

## 2016-07-08 NOTE — Progress Notes (Addendum)
CSW assisting with d/c planning. MD reports pt is ready for dc today. RNCM reports that both LTAC's have, again , declined pt for placement. PN indicate pt has refused vent last night. CSW has contacted [facility] requesting readmission.  NSG will discuss this with facility administrator and contact CSW with decision.   Werner Lean LCSW [phone]  11:54 CSW is expecting Director of ALF to screen pt today for readmission to [facility]. Pt is aware and is requesting to return to ALF. CSW will update Pt / MD / NSG  once a decision has been made.  Werner Lean LCSW [phone]

## 2016-07-08 NOTE — Progress Notes (Signed)
Date:  July 08, 2016  Chart reviewed for concurrent status and case management needs.  Will continue to follow patient progress.  Principal Problem:   Volume overload Active Problems:   Obesity hypoventilation syndrome (HCC)   Type II diabetes mellitus (HCC)   Current smoker   COPD (chronic obstructive pulmonary disease) (HCC)   Chronic ulcer of sacral region ([place])   Acute hypoxemic respiratory failure (HCC)   OSA (obstructive sleep apnea)   Encounter for palliative care   Goals of care, counseling/discussion Discharge Planning: following for needs   Both area [facility] HAVE TURNED DOWN PATIENT FOR ADMISSION DUE TO ATTITUDE AND COMPLIANCE ISSUES. Expected discharge date: [id number]  Velva Harman, [place], [place], [place]
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
## 2016-07-08 NOTE — Progress Notes (Signed)
Patient refusing dressing change to stage IV sacral ulcer.  Patient educated on importance of dressing change, however continues to refuse.

## 2016-07-08 NOTE — NC FL2 (Deleted)
[place] LEVEL OF CARE SCREENING TOOL     IDENTIFICATION  Patient Name: [name] Birthdate: 12-18-1965 Sex: male Admission Date (Current Location): 06/27/2016  [facility] and [place] Number:  Herbalist and Address:  [facility],  [place] [address], [place]      Provider Number: [id number]  Attending Physician Name and Address:  Florencia Reasons, MD  Relative Name and Phone Number:       Current Level of Care: Hospital Recommended Level of Care: [place] Prior Approval Number:    Date Approved/Denied:   PASRR Number:    Discharge Plan: Other (Comment) (ALF)    Current Diagnoses: Patient Active Problem List   Diagnosis Date Noted  . OSA (obstructive sleep apnea)   . Encounter for palliative care   . Goals of care, counseling/discussion   . Acute hypoxemic respiratory failure ([place])   . Volume overload 06/28/2016  . Pressure injury of skin 06/15/2016  . SOB (shortness of breath) 06/10/2016  . Pain of left hip joint 11/21/2015  . Bilateral chronic knee pain 07/25/2015  . Tracheostomy dependent ([place]) 03/10/2015  . COPD (chronic obstructive pulmonary disease) ([place])   . Essential hypertension 01/20/2015  . DOE (dyspnea on exertion) 01/20/2015  . Toenail fungus 07/12/2014  . Acute on chronic diastolic CHF (congestive heart failure), NYHA class 1 ([place]) 06/15/2014  . Hyperlipidemia associated with type 2 diabetes mellitus ([place]) 05/05/2014  . Cardiomegaly 05/02/2014  . Nasal congestion 05/02/2014  . Current smoker 05/02/2014  . Severe obesity (BMI >= 40) ([place]) 11/28/2013  . Type II diabetes mellitus ([place]) 11/28/2013  . Left anterior fascicular block 11/27/2013  . Poor social situation 11/27/2013  . Monoclonal gammopathy 12/14/2010  . Decubitus ulcer of sacral area 12/10/2010  . Obesity hypoventilation syndrome ([place]) 12/09/2010  . Chronic ulcer of sacral region ([place]) 02/04/2010    Orientation RESPIRATION BLADDER  Height & Weight     Self, Time, Situation, Place  Tracheostomy Continent Weight: (!) 404 lb 15.8 oz (183.7 kg) Height:  5' 7"  (170.2 cm)  BEHAVIORAL SYMPTOMS/MOOD NEUROLOGICAL BOWEL NUTRITION STATUS  Other (Comment) (Pt can be uncooperative with care.)   Continent Diet (Heart Healthy)  AMBULATORY STATUS COMMUNICATION OF NEEDS Skin   Supervision (Ambulating over 400 ft with supervision.) Verbally  (Stage 4 on sacrum with x2 daily dressing changes recommended.)                       Personal Care Assistance Level of Assistance  Bathing, Feeding, Dressing Bathing Assistance: Limited assistance Feeding assistance: Independent Dressing Assistance: Limited assistance     Functional Limitations Info  Sight, Hearing, Speech Sight Info: Adequate Hearing Info: Adequate Speech Info: Adequate    SPECIAL CARE FACTORS FREQUENCY                       Contractures Contractures Info: Not present    Additional Factors Info  Code Status Code Status Info: DNR Allergies Info: TRAMADOL, ROBAXIN, IBUPROFEN           Current Medications (07/08/2016):  This is the current hospital active medication list Current Facility-Administered Medications  Medication Dose Route Frequency Provider Last Rate Last Dose  . acetaminophen (TYLENOL) tablet 650 mg  650 mg Oral Q6H PRN Raiford Noble [place], DO   650 mg at 07/08/16 1008  . albuterol (PROVENTIL) (2.5 MG/3ML) 0.083% nebulizer solution 2.5 mg  2.5 mg Nebulization Q4H PRN Tennis Must  Audelia Hives, MD   2.5 mg at 07/06/16 2303  . aspirin EC tablet 81 mg  81 mg Oral Daily Reubin Milan, MD   81 mg at 07/08/16 1008  . aspirin-acetaminophen-caffeine (EXCEDRIN MIGRAINE) per tablet 1 tablet  1 tablet Oral Q8H PRN Raiford Noble [place], DO   1 tablet at 07/05/16 0045  . atorvastatin (LIPITOR) tablet 20 mg  20 mg Oral Daily Reubin Milan, MD   20 mg at 07/08/16 1008  . carvedilol (COREG) tablet 6.25 mg  6.25 mg Oral BID WC Sheikh, Georgina Quint [place], DO    6.25 mg at 07/08/16 0820  . chlorhexidine (PERIDEX) 0.12 % solution 15 mL  15 mL Mouth Rinse BID Raiford Noble [place], DO   15 mL at 07/08/16 1008  . chlorhexidine gluconate (MEDLINE KIT) (PERIDEX) 0.12 % solution 15 mL  15 mL Mouth Rinse BID de Dios, [place] A, MD   15 mL at 07/08/16 0109  . enoxaparin (LOVENOX) injection 90 mg  90 mg Subcutaneous Q24H SheikhGeorgina Quint [place], DO   90 mg at 07/08/16 1004  . furosemide (LASIX) injection 60 mg  60 mg Intravenous TID Raiford Noble Latif, DO   60 mg at 07/08/16 1009  . hydrOXYzine (ATARAX/VISTARIL) tablet 50 mg  50 mg Oral QHS Florencia Reasons, MD   50 mg at 07/07/16 2202  . insulin aspart (novoLOG) injection 0-20 Units  0-20 Units Subcutaneous TID WC Reubin Milan, MD   7 Units at 07/08/16 1234  . MEDLINE mouth rinse  15 mL Mouth Rinse q12n4p Raiford Noble [place], DO   15 mL at 07/08/16 1228  . multivitamin with minerals tablet 1 tablet  1 tablet Oral Daily Reubin Milan, MD   1 tablet at 07/08/16 1008  . ondansetron (ZOFRAN) injection 4 mg  4 mg Intravenous Q6H PRN Raiford Noble [place], DO   4 mg at 07/03/16 2145  . ondansetron (ZOFRAN) tablet 4 mg  4 mg Oral Q6H PRN Sheikh, Georgina Quint Latif, DO      . polyethylene glycol (MIRALAX / GLYCOLAX) packet 17 g  17 g Oral BID PRN Reubin Milan, MD   17 g at 07/05/16 0912  . potassium chloride SA (K-DUR,KLOR-CON) CR tablet 20 mEq  20 mEq Oral Daily Reubin Milan, MD   20 mEq at 07/08/16 1008  . protein supplement (PREMIER PROTEIN) liquid  11 oz Oral BID BM Florencia Reasons, MD   11 oz at 07/08/16 1228  . protein supplement (UNJURY CHICKEN SOUP) powder 8 oz  8 oz Oral Daily Florencia Reasons, MD   8 oz at 07/08/16 1227     Discharge Medications: Please see discharge summary for a list of discharge medications.   Medication List    TAKE these medications   ACCU-CHEK FASTCLIX LANCETS Misc 1 each by Does not apply route 3 (three) times daily. E11.9   ACCU-CHEK NANO SMARTVIEW w/Device Kit 1 Device by Does  not apply route as needed. E 11.9   albuterol (2.5 MG/3ML) 0.083% nebulizer solution Commonly known as:  PROVENTIL Take 3 mLs (2.5 mg total) by nebulization every 6 (six) hours as needed for wheezing or shortness of breath.   aspirin 81 MG EC tablet Take 1 tablet (81 mg total) by mouth daily.   atorvastatin 20 MG tablet Commonly known as:  LIPITOR Take 1 tablet (20 mg total) by mouth daily.   carvedilol 6.25 MG tablet Commonly known as:  COREG Take 1 tablet (6.25 mg total) by mouth 2 (  two) times daily with a meal.   furosemide 20 MG tablet Commonly known as:  LASIX Take 3 tablets (60 mg total) by mouth 3 (three) times daily. What changed:  medication strength  when to take this  additional instructions   metFORMIN 500 MG tablet Commonly known as:  GLUCOPHAGE Take 2 tablets (1,000 mg total) by mouth 2 (two) times daily with a meal.   multivitamin with minerals Tabs tablet Take 1 tablet by mouth daily.   polyethylene glycol packet Commonly known as:  MIRALAX / GLYCOLAX Take 17 g by mouth 2 (two) times daily as needed for mild constipation. Reported on 08/03/2015   potassium chloride SA 20 MEQ tablet Commonly known as:  K-DUR,KLOR-CON Take 1 tablet (20 mEq total) by mouth daily.   sitaGLIPtin 100 MG tablet Commonly known as:  JANUVIA Take 1 tablet (100 mg total) by mouth daily.       Relevant Imaging Results:  Relevant Lab Results:   Additional Information SS# [id number]. Pt will need [place] services for wound care at facility.  Matson Welch, Randall An, LCSW

## 2016-07-08 NOTE — Progress Notes (Signed)
CSW assisting with d/c planning. CSW placed another call to Director, Damien Fusi, to see when she will be here today to screen pt for readmission back to ALF. Awaiting return call. Pt is ready for d/c today. CSW will continue to follow to assist with d/c planning.  Werner Lean LCSW [phone]

## 2016-07-09 LAB — GLUCOSE, CAPILLARY
GLUCOSE-CAPILLARY: 154 mg/dL — AB (ref 65–99)
GLUCOSE-CAPILLARY: 183 mg/dL — AB (ref 65–99)
GLUCOSE-CAPILLARY: 325 mg/dL — AB (ref 65–99)
Glucose-Capillary: 104 mg/dL — ABNORMAL HIGH (ref 65–99)

## 2016-07-09 LAB — BASIC METABOLIC PANEL
Anion gap: 10 (ref 5–15)
BUN: 25 mg/dL — ABNORMAL HIGH (ref 6–20)
CHLORIDE: 86 mmol/L — AB (ref 101–111)
CO2: 42 mmol/L — ABNORMAL HIGH (ref 22–32)
CREATININE: 0.83 mg/dL (ref 0.61–1.24)
Calcium: 9.4 mg/dL (ref 8.9–10.3)
GFR calc non Af Amer: >60 mL/min (ref >60)
Glucose, Bld: 161 mg/dL — ABNORMAL HIGH (ref 65–99)
POTASSIUM: 3.6 mmol/L (ref 3.5–5.1)
SODIUM: 138 mmol/L (ref 135–145)

## 2016-07-09 LAB — CBC WITH DIFFERENTIAL/PLATELET
BASOS ABS: 0 10*3/uL (ref 0.0–0.1)
Basophils Relative: 0 %
Eosinophils Absolute: 0.4 10*3/uL (ref 0.0–0.7)
Eosinophils Relative: 3 %
HCT: 41 % (ref 39.0–52.0)
HEMOGLOBIN: 14.3 g/dL (ref 13.0–17.0)
LYMPHS PCT: 27 %
Lymphs Abs: 4 10*3/uL (ref 0.7–4.0)
MCH: 26.7 pg (ref 26.0–34.0)
MCHC: 34.9 g/dL (ref 30.0–36.0)
MCV: 76.6 fL — ABNORMAL LOW (ref 78.0–100.0)
Monocytes Absolute: 1.5 10*3/uL — ABNORMAL HIGH (ref 0.1–1.0)
Monocytes Relative: 10 %
NEUTROS PCT: 60 %
Neutro Abs: 8.9 10*3/uL — ABNORMAL HIGH (ref 1.7–7.7)
Platelets: 234 10*3/uL (ref 150–400)
RBC: 5.35 MIL/uL (ref 4.22–5.81)
RDW: 15.7 % — ABNORMAL HIGH (ref 11.5–15.5)
WBC: 14.8 10*3/uL — ABNORMAL HIGH (ref 4.0–10.5)

## 2016-07-09 LAB — MAGNESIUM: MAGNESIUM: 1.9 mg/dL (ref 1.7–2.4)

## 2016-07-09 MED ORDER — POTASSIUM CHLORIDE CRYS ER 20 MEQ PO TBCR
40.0000 meq | EXTENDED_RELEASE_TABLET | Freq: Once | ORAL | Status: AC
  Administered 2016-07-09: 40 meq via ORAL
  Filled 2016-07-09: qty 2

## 2016-07-09 NOTE — Progress Notes (Signed)
Changed trach to [place] 6XLT cuffless.  Titrated FIO2 to 28%.  Pt tolerated well.

## 2016-07-09 NOTE — Progress Notes (Signed)
RT Note: Social work called RT to see if we can work on weaning patients oxygen down to 28% or lower. RT went to see the patient and assess him and he was only saturating 91% on his 40% aerosol trach collar. RT felt that the patient's current Spo2 did not support weaning his oxygen lower presently. Rt discussed with the patient that we will be working on weaning his O2 down and he understands and wants that done. Rt will continue to monitor the patient's Spo2 and wean down the amount of oxygen he is receiving as tolerated by the patient.  He also wanted to find out when his trach is going to be changed. I notified the patients RN that he is requesting information on when his trach will be changed and she is going to find out from his doctor when that is scheduled to be done. Rt will continue to monitor.

## 2016-07-09 NOTE — Progress Notes (Signed)
RT Note: RT paged [name] Griffon, NP to clarify if RT can change the patients trach. It is an extra long trach. Dr. Erlinda Hong ordered for [place] to change it but said to clarify with [facility] whether RT can change it since it's an extra long or if MD/ENT needs to do it. Laurey Arrow has not paged back yet. I am passing on to the RT taking this assignment when I leave to follow up and attempt to contact MD again. Rt will continue to monitor.
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
## 2016-07-09 NOTE — Consult Note (Signed)
   [facility] CM Inpatient Consult   07/09/2016  [name] 30-Nov-1965 [id number]      Patient screened for potential [facility] Care Management services. Chart reviewed. Noted current discharge plan is for  SNF.  There are no identifiable [facility] Care Management needs at this time. Confirmed with inpatient RNCM.  Marthenia Rolling, MSN-Ed, RN,BSN [facility] [place] Liaison [phone]

## 2016-07-09 NOTE — Progress Notes (Signed)
CSW following for disposition/ DC planning.   Pt admitted from ALF however ALF has DC'd him at this time stating his needs cannot be met there any longer. New SNF search initiated (for LT care) and CSW provided pt bed offers, pt selects [facility]. Selected facility in the [place].  Current barrier to DC is that pt's trach settings must be within parameters facility can accept (28%, cuffless). Discussed with respiratory, will follow.   Sharren Bridge, MSW, LCSW Clinical Social Work 07/09/2016 [phone]

## 2016-07-09 NOTE — Progress Notes (Signed)
RT in to change Aerosol Trach bottle per pt. request, RT notified by RN, made pt. aware to call if any other assistance needed.

## 2016-07-10 LAB — GLUCOSE, CAPILLARY
GLUCOSE-CAPILLARY: 137 mg/dL — AB (ref 65–99)
GLUCOSE-CAPILLARY: 203 mg/dL — AB (ref 65–99)
Glucose-Capillary: 159 mg/dL — ABNORMAL HIGH (ref 65–99)
Glucose-Capillary: 209 mg/dL — ABNORMAL HIGH (ref 65–99)

## 2016-07-10 LAB — BASIC METABOLIC PANEL
ANION GAP: 10 (ref 5–15)
BUN: 24 mg/dL — ABNORMAL HIGH (ref 6–20)
CHLORIDE: 87 mmol/L — AB (ref 101–111)
CO2: 40 mmol/L — AB (ref 22–32)
Calcium: 9.4 mg/dL (ref 8.9–10.3)
Creatinine, Ser: 0.72 mg/dL (ref 0.61–1.24)
GFR calc non Af Amer: >60 mL/min (ref >60)
Glucose, Bld: 188 mg/dL — ABNORMAL HIGH (ref 65–99)
POTASSIUM: 3.6 mmol/L (ref 3.5–5.1)
Sodium: 137 mmol/L (ref 135–145)

## 2016-07-10 LAB — CBC
HEMATOCRIT: 40.9 % (ref 39.0–52.0)
HEMOGLOBIN: 13.7 g/dL (ref 13.0–17.0)
MCH: 25.6 pg — ABNORMAL LOW (ref 26.0–34.0)
MCHC: 33.5 g/dL (ref 30.0–36.0)
MCV: 76.3 fL — ABNORMAL LOW (ref 78.0–100.0)
Platelets: 245 10*3/uL (ref 150–400)
RBC: 5.36 MIL/uL (ref 4.22–5.81)
RDW: 15.8 % — ABNORMAL HIGH (ref 11.5–15.5)
WBC: 15.8 10*3/uL — ABNORMAL HIGH (ref 4.0–10.5)

## 2016-07-10 LAB — MAGNESIUM: Magnesium: 1.8 mg/dL (ref 1.7–2.4)

## 2016-07-10 MED ORDER — FUROSEMIDE 10 MG/ML IJ SOLN
80.0000 mg | Freq: Three times a day (TID) | INTRAMUSCULAR | Status: DC
  Administered 2016-07-10 – 2016-07-12 (×6): 80 mg via INTRAVENOUS
  Filled 2016-07-10 (×6): qty 8

## 2016-07-10 MED ORDER — POTASSIUM CHLORIDE CRYS ER 20 MEQ PO TBCR
20.0000 meq | EXTENDED_RELEASE_TABLET | Freq: Two times a day (BID) | ORAL | Status: DC
  Administered 2016-07-10 – 2016-07-12 (×4): 20 meq via ORAL
  Filled 2016-07-10 (×4): qty 1

## 2016-07-10 NOTE — Progress Notes (Signed)
Physical Therapy Treatment Patient Details Name: [name] MRN: [id number] DOB: 1965-05-14 Today's Date: 07/10/2016    History of Present Illness [name] is a 51 y.o. male with medical history significant of CHF, chronic ulcer of sacral region, COPD, daily smoker, type 2 diabetes, hypertension, sleep apnea, tracheostomy dependent recently admitted from 05/07-19/2018  for hypoxia , admitted 5/24 for  progressive/increased dyspnea     PT Comments    Assisted with around unit using bariatric RW on 35% 6 lts TC avg sats 99%  Follow Up Recommendations  No PT follow up     Equipment Recommendations  Bariatric Rollator   Recommendations for Other Services        Precautions / Restrictions Precautions Precautions: Fall Precaution Comments: trach collar Restrictions Weight Bearing Restrictions: No    Mobility  Bed Mobility               General bed mobility comments: OOB in recliner  Transfers Overall transfer level: Modified independent Equipment used: None;Rolling walker (2 wheeled)             General transfer comment: good use of hands to steady self and good safety awarness (lines)  Ambulation/Gait Ambulation/Gait assistance: Supervision Ambulation Distance (Feet): 550 Feet Assistive device: Rolling walker (2 wheeled) Gait Pattern/deviations: Step-through pattern;Decreased stride length Gait velocity: WFL   General Gait Details: 35% 6 lts TC avg sats 99% with amb   Stairs            Wheelchair Mobility    Modified Rankin (Stroke Patients Only)       Balance                                            Cognition Arousal/Alertness: Awake/alert Behavior During Therapy: WFL for tasks assessed/performed Overall Cognitive Status: Within Functional Limits for tasks assessed                                        Exercises      General Comments        Pertinent Vitals/Pain Pain Assessment:  No/denies pain    Home Living                      Prior Function            PT Goals (current goals can now be found in the care plan section) Progress towards PT goals: Progressing toward goals    Frequency    Min 3X/week      PT Plan Current plan remains appropriate    Co-evaluation              AM-PAC PT "6 Clicks" Daily Activity  Outcome Measure  Difficulty turning over in bed (including adjusting bedclothes, sheets and blankets)?: None Difficulty moving from lying on back to sitting on the side of the bed? : None Difficulty sitting down on and standing up from a chair with arms (e.g., wheelchair, bedside commode, etc,.)?: None Help needed moving to and from a bed to chair (including a wheelchair)?: None Help needed walking in hospital room?: A Little Help needed climbing 3-5 steps with a railing? : A Lot 6 Click Score: 21    End of Session Equipment Utilized During Treatment: Oxygen Activity Tolerance:  Patient tolerated treatment well Patient left: in chair;with call bell/phone within reach Nurse Communication: Mobility status PT Visit Diagnosis: Difficulty in walking, not elsewhere classified (R26.2)     Time: 8616-8372 PT Time Calculation (min) (ACUTE ONLY): 19 min  Charges:  $Gait Training: 8-22 mins                    G Codes:       Rica Koyanagi  PTA [facility]  Acute  Rehab Pager      [phone]
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
## 2016-07-11 DIAGNOSIS — I5033 Acute on chronic diastolic (congestive) heart failure: Secondary | ICD-10-CM

## 2016-07-11 LAB — GLUCOSE, CAPILLARY
GLUCOSE-CAPILLARY: 197 mg/dL — AB (ref 65–99)
GLUCOSE-CAPILLARY: 193 mg/dL — AB (ref 65–99)
GLUCOSE-CAPILLARY: 216 mg/dL — AB (ref 65–99)
GLUCOSE-CAPILLARY: 99 mg/dL (ref 65–99)
Glucose-Capillary: 271 mg/dL — ABNORMAL HIGH (ref 65–99)

## 2016-07-11 LAB — BASIC METABOLIC PANEL
Anion gap: 11 (ref 5–15)
BUN: 23 mg/dL — ABNORMAL HIGH (ref 6–20)
CALCIUM: 9.4 mg/dL (ref 8.9–10.3)
CO2: 38 mmol/L — AB (ref 22–32)
CREATININE: 0.68 mg/dL (ref 0.61–1.24)
Chloride: 90 mmol/L — ABNORMAL LOW (ref 101–111)
GFR calc non Af Amer: >60 mL/min (ref >60)
Glucose, Bld: 162 mg/dL — ABNORMAL HIGH (ref 65–99)
Potassium: 4.4 mmol/L (ref 3.5–5.1)
SODIUM: 139 mmol/L (ref 135–145)

## 2016-07-11 MED ORDER — PREMIER PROTEIN SHAKE
11.0000 [oz_av] | ORAL | Status: DC
  Filled 2016-07-11: qty 325.31

## 2016-07-11 MED ORDER — PRO-STAT SUGAR FREE PO LIQD
60.0000 mL | Freq: Two times a day (BID) | ORAL | Status: DC
  Administered 2016-07-11 (×2): 60 mL via ORAL
  Filled 2016-07-11 (×2): qty 60

## 2016-07-11 NOTE — Care Management Important Message (Signed)
Important Message  Patient Details  Name: [name] MRN: [id number] Date of Birth: 06-25-1965   Medicare Important Message Given:  Yes    Kerin Salen 07/11/2016, 11:23 Gem Message  Patient Details  Name: [name] MRN: [id number] Date of Birth: 28-Jun-1965   Medicare Important Message Given:  Yes    Kerin Salen 07/11/2016, 11:23 AM
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
## 2016-07-11 NOTE — Progress Notes (Signed)
CSW following to assist with transition to LT SNF at [place]. Pt had selected [facility], [place] informed that pt now wavering on choice due to "reading [employer] reviews." CSW met with pt, provided and discussed pt's other bed offers. Pt declined other facilities who offered beds and states he will resume plans to go to [facility] at [place].  Sharren Bridge, MSW, LCSW Clinical Social Work 07/11/2016 [phone]

## 2016-07-11 NOTE — Progress Notes (Signed)
Nutrition Follow-up  DOCUMENTATION CODES:   Morbid obesity  INTERVENTION:   D/c Unjury chicken soup Decrease Premier Protein supplements to once daily, each provides 160 kcal and 30 g protein Provide 60 ml Prostat BID, each provides 100 kcal and 15g protein  RD to continue to monitor  NUTRITION DIAGNOSIS:   Increased nutrient needs (protein) related to wound healing (chronic stage 4 sacral wound) as evidenced by estimated needs.  Ongoing.  GOAL:   Patient will meet greater than or equal to 90% of their needs  Meeting  MONITOR:   PO intake, Supplement acceptance, Weight trends, Labs, Skin  ASSESSMENT:   51 y.o. male with medical history significant of CHF, chronic ulcer of sacral region, COPD, daily smoker, type 2 diabetes, hypertension, sleep apnea, tracheostomy dependent recently admitted from 06/10/2016 and 2 06/22/2016 for hypoxia and volume overload who is coming to the emergency department via EMS with complaints of progressively worse dyspnea with minimal exertion for about a week, associated with lower extremity and abdomen edema. He mentions that he did not take one of his furosemide doses, but is documented by EGD provider that he may have missed several doses.  Patient currently consuming 100% of meals at this time. Per discussion with pt's RN, MD would like  More concentrated protein supplement options as pt is volume overloaded. Will d/c Unjury and decrease order of Premier Protein to once daily. Will add 4 Prostat supplements daily to still meet protein needs.  Weight is trending down to UBW.  Medications: IV Lasix TID, Multivitamin with minerals daily, K-DUR tablet BID Labs reviewed: CBGs: 193-216  Diet Order:  Diet heart healthy/carb modified Room service appropriate? Yes; Fluid consistency: Thin; Fluid restriction: 1500 mL Fluid Diet - low sodium heart healthy  Skin:    Stage 4 sacral pressure injury  Last BM:  6/7  Height:   Ht Readings from Last 1  Encounters:  07/04/16 5\' 7"  (1.702 m)    Weight:   Wt Readings from Last 1 Encounters:  07/11/16 (!) 407 lb 6.6 oz (184.8 kg)    Ideal Body Weight:  67.27 kg  BMI:  Body mass index is 63.81 kg/m.  Estimated Nutritional Needs:   Kcal:  0569-7948 (12-14 kcal/kg)  Protein:  >/= 148 grams (2.2 grams/kg IBW)  Fluid:  >/= 2 L/day  EDUCATION NEEDS:   Education needs no appropriate at this time  Clayton Bibles, MS, RD, LDN Pager: [phone] After Hours Pager: [phone]

## 2016-07-12 DIAGNOSIS — G4733 Obstructive sleep apnea (adult) (pediatric): Secondary | ICD-10-CM | POA: Diagnosis not present

## 2016-07-12 DIAGNOSIS — I517 Cardiomegaly: Secondary | ICD-10-CM | POA: Diagnosis not present

## 2016-07-12 DIAGNOSIS — R0609 Other forms of dyspnea: Secondary | ICD-10-CM | POA: Diagnosis not present

## 2016-07-12 DIAGNOSIS — I5033 Acute on chronic diastolic (congestive) heart failure: Secondary | ICD-10-CM | POA: Diagnosis not present

## 2016-07-12 DIAGNOSIS — I444 Left anterior fascicular block: Secondary | ICD-10-CM | POA: Diagnosis not present

## 2016-07-12 DIAGNOSIS — I5032 Chronic diastolic (congestive) heart failure: Secondary | ICD-10-CM | POA: Diagnosis not present

## 2016-07-12 DIAGNOSIS — L89154 Pressure ulcer of sacral region, stage 4: Secondary | ICD-10-CM | POA: Diagnosis not present

## 2016-07-12 DIAGNOSIS — M2550 Pain in unspecified joint: Secondary | ICD-10-CM | POA: Diagnosis not present

## 2016-07-12 DIAGNOSIS — J9601 Acute respiratory failure with hypoxia: Secondary | ICD-10-CM | POA: Diagnosis not present

## 2016-07-12 DIAGNOSIS — E785 Hyperlipidemia, unspecified: Secondary | ICD-10-CM | POA: Diagnosis not present

## 2016-07-12 DIAGNOSIS — Z659 Problem related to unspecified psychosocial circumstances: Secondary | ICD-10-CM | POA: Diagnosis not present

## 2016-07-12 DIAGNOSIS — E8770 Fluid overload, unspecified: Secondary | ICD-10-CM | POA: Diagnosis not present

## 2016-07-12 DIAGNOSIS — F17210 Nicotine dependence, cigarettes, uncomplicated: Secondary | ICD-10-CM | POA: Diagnosis not present

## 2016-07-12 DIAGNOSIS — R0600 Dyspnea, unspecified: Secondary | ICD-10-CM | POA: Diagnosis not present

## 2016-07-12 DIAGNOSIS — F418 Other specified anxiety disorders: Secondary | ICD-10-CM | POA: Diagnosis not present

## 2016-07-12 DIAGNOSIS — G8929 Other chronic pain: Secondary | ICD-10-CM | POA: Diagnosis not present

## 2016-07-12 DIAGNOSIS — Z6841 Body Mass Index (BMI) 40.0 and over, adult: Secondary | ICD-10-CM | POA: Diagnosis not present

## 2016-07-12 DIAGNOSIS — D472 Monoclonal gammopathy: Secondary | ICD-10-CM | POA: Diagnosis not present

## 2016-07-12 DIAGNOSIS — J449 Chronic obstructive pulmonary disease, unspecified: Secondary | ICD-10-CM | POA: Diagnosis not present

## 2016-07-12 DIAGNOSIS — M6281 Muscle weakness (generalized): Secondary | ICD-10-CM | POA: Diagnosis not present

## 2016-07-12 DIAGNOSIS — R0602 Shortness of breath: Secondary | ICD-10-CM | POA: Diagnosis not present

## 2016-07-12 DIAGNOSIS — J438 Other emphysema: Secondary | ICD-10-CM | POA: Diagnosis not present

## 2016-07-12 DIAGNOSIS — B351 Tinea unguium: Secondary | ICD-10-CM | POA: Diagnosis not present

## 2016-07-12 DIAGNOSIS — I10 Essential (primary) hypertension: Secondary | ICD-10-CM | POA: Diagnosis not present

## 2016-07-12 DIAGNOSIS — M19072 Primary osteoarthritis, left ankle and foot: Secondary | ICD-10-CM | POA: Diagnosis not present

## 2016-07-12 DIAGNOSIS — R2681 Unsteadiness on feet: Secondary | ICD-10-CM | POA: Diagnosis not present

## 2016-07-12 DIAGNOSIS — R5381 Other malaise: Secondary | ICD-10-CM | POA: Diagnosis not present

## 2016-07-12 DIAGNOSIS — E662 Morbid (severe) obesity with alveolar hypoventilation: Secondary | ICD-10-CM | POA: Diagnosis not present

## 2016-07-12 DIAGNOSIS — M79672 Pain in left foot: Secondary | ICD-10-CM | POA: Diagnosis not present

## 2016-07-12 DIAGNOSIS — E1165 Type 2 diabetes mellitus with hyperglycemia: Secondary | ICD-10-CM | POA: Diagnosis not present

## 2016-07-12 DIAGNOSIS — Z930 Tracheostomy status: Secondary | ICD-10-CM | POA: Diagnosis not present

## 2016-07-12 DIAGNOSIS — R262 Difficulty in walking, not elsewhere classified: Secondary | ICD-10-CM | POA: Diagnosis not present

## 2016-07-12 DIAGNOSIS — Z430 Encounter for attention to tracheostomy: Secondary | ICD-10-CM | POA: Diagnosis not present

## 2016-07-12 LAB — BASIC METABOLIC PANEL
Anion gap: 10 (ref 5–15)
BUN: 25 mg/dL — AB (ref 6–20)
CO2: 39 mmol/L — ABNORMAL HIGH (ref 22–32)
CREATININE: 0.69 mg/dL (ref 0.61–1.24)
Calcium: 9.3 mg/dL (ref 8.9–10.3)
Chloride: 91 mmol/L — ABNORMAL LOW (ref 101–111)
GFR calc Af Amer: >60 mL/min (ref >60)
GLUCOSE: 211 mg/dL — AB (ref 65–99)
Potassium: 3.7 mmol/L (ref 3.5–5.1)
SODIUM: 140 mmol/L (ref 135–145)

## 2016-07-12 LAB — CBC
HCT: 38.9 % — ABNORMAL LOW (ref 39.0–52.0)
Hemoglobin: 13.1 g/dL (ref 13.0–17.0)
MCH: 25.7 pg — ABNORMAL LOW (ref 26.0–34.0)
MCHC: 33.7 g/dL (ref 30.0–36.0)
MCV: 76.4 fL — ABNORMAL LOW (ref 78.0–100.0)
PLATELETS: 242 10*3/uL (ref 150–400)
RBC: 5.09 MIL/uL (ref 4.22–5.81)
RDW: 15.7 % — AB (ref 11.5–15.5)
WBC: 14.2 10*3/uL — ABNORMAL HIGH (ref 4.0–10.5)

## 2016-07-12 LAB — GLUCOSE, CAPILLARY
GLUCOSE-CAPILLARY: 189 mg/dL — AB (ref 65–99)
Glucose-Capillary: 264 mg/dL — ABNORMAL HIGH (ref 65–99)

## 2016-07-12 MED ORDER — FUROSEMIDE 40 MG PO TABS: 80.0000 mg | ORAL_TABLET | Freq: Two times a day (BID) | ORAL | 2 refills | Status: AC

## 2016-07-12 MED ORDER — PRO-STAT SUGAR FREE PO LIQD: 60.0000 mL | Freq: Two times a day (BID) | ORAL | 0 refills | Status: AC

## 2016-07-12 NOTE — Progress Notes (Signed)
Discharge education complete, pt requests friend to transport to [facility]. Social worker notified. Report called to [place], Therapist, sports.

## 2016-07-12 NOTE — Clinical Social Work Placement (Signed)
   CLINICAL SOCIAL WORK PLACEMENT  NOTE  Date:  08/01/2016  Patient Details  Name: [name] MRN: [id number] Date of Birth: 04/23/1965  Clinical Social Work is seeking post-discharge placement for this patient at the [place] level of care (*CSW will initial, date and re-position this form in  chart as items are completed):  Yes   Patient/family provided with [place] Work Department's list of facilities offering this level of care within the geographic area requested by the patient (or if unable, by the patient's family).  Yes   Patient/family informed of their freedom to choose among providers that offer the needed level of care, that participate in Medicare, Medicaid or managed care program needed by the patient, have an available bed and are willing to accept the patient.  Yes   Patient/family informed of [place]'s ownership interest in [facility] and [facility], as well as of the fact that they are under no obligation to receive care at these facilities.  PASRR submitted to EDS on       PASRR number received on       Existing PASRR number confirmed on 07/08/16     FL2 transmitted to all facilities in geographic area requested by pt/family on 07/08/16     FL2 transmitted to all facilities within larger geographic area on 07/08/16     Patient informed that his/her managed care company has contracts with or will negotiate with certain facilities, including the following:        Yes   Patient/family informed of bed offers received.  Patient chooses bed at [facility]     Physician recommends and patient chooses bed at      Patient to be transferred to [facility] on 07/19/2016.  Patient to be transferred to facility by [facility]     Patient family notified on 07/23/2016 of transfer.  Name of family member notified:  [employer] friend      PHYSICIAN       Additional  Comment: Pt in agreement with d/c to [facility] today. [facility] transport required. Medical necessity form completed. D/C Summary sent to SNF for review. No scripts printed. # for report provided to nsg.   _______________________________________________ Luretha Rued, [place] 07/26/2016, 2:55 PM
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
## 2016-07-15 DIAGNOSIS — M6281 Muscle weakness (generalized): Secondary | ICD-10-CM | POA: Diagnosis not present

## 2016-07-16 ENCOUNTER — Encounter: Payer: Self-pay | Admitting: Internal Medicine

## 2016-07-16 ENCOUNTER — Non-Acute Institutional Stay (SKILLED_NURSING_FACILITY): Payer: Medicare Other | Admitting: Internal Medicine

## 2016-07-16 ENCOUNTER — Telehealth: Payer: Self-pay | Admitting: Emergency Medicine

## 2016-07-16 DIAGNOSIS — F418 Other specified anxiety disorders: Secondary | ICD-10-CM

## 2016-07-16 DIAGNOSIS — Z930 Tracheostomy status: Secondary | ICD-10-CM | POA: Diagnosis not present

## 2016-07-16 DIAGNOSIS — J438 Other emphysema: Secondary | ICD-10-CM

## 2016-07-16 DIAGNOSIS — M2550 Pain in unspecified joint: Secondary | ICD-10-CM

## 2016-07-16 DIAGNOSIS — G8929 Other chronic pain: Secondary | ICD-10-CM

## 2016-07-16 DIAGNOSIS — I5032 Chronic diastolic (congestive) heart failure: Secondary | ICD-10-CM

## 2016-07-16 DIAGNOSIS — I10 Essential (primary) hypertension: Secondary | ICD-10-CM

## 2016-07-16 DIAGNOSIS — R5381 Other malaise: Secondary | ICD-10-CM

## 2016-07-16 DIAGNOSIS — L89154 Pressure ulcer of sacral region, stage 4: Secondary | ICD-10-CM | POA: Diagnosis not present

## 2016-07-16 DIAGNOSIS — E662 Morbid (severe) obesity with alveolar hypoventilation: Secondary | ICD-10-CM | POA: Diagnosis not present
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
## 2016-07-16 NOTE — Progress Notes (Signed)
Patient ID: [name], male   DOB: 16-Jan-1966, 51 y.o.   MRN: [id number]    HISTORY AND PHYSICAL   DATE: 07/16/2016  Location:    [facility] Room Number: 108 A Place of Service: SNF (31)   Extended Emergency Contact Information Primary Emergency Contact: Thompson,William Address: [address]          [place], [place] [address] [place] Phone: [phone] Work Phone: [phone] Relation: Friend Secondary Emergency Contact: Ottis Stain, [place] Phone: [phone] Relation: Friend  Advanced Directive information Does Patient Have a Medical Advance Directive?: Yes, Type of Advance Directive: Out of facility DNR (pink MOST or yellow form), Pre-existing out of facility DNR order (yellow form or pink MOST form): Yellow form placed in chart (order not valid for inpatient use), Does patient want to make changes to medical advance directive?: No - Patient declined  Chief Complaint  Patient presents with  . New Admit To SNF    Admission    HPI:  51 yo male seen today as a new admission into SNF following hospital stay for acute/chronic d HF, anasarca, chronic sacral ulcer, COPD with chronic tobacco abuse, DM, HTN, OSA/OHS, acute hypoxic respiratory failure, s/p trach in 2012. He presented to the ED with worsening DOE. CXR showed cardiomegaly and mild diffuse pulmonary edema. He was tx with OV lasix and diuresed negative 30 Liters. WBC 20.6K-->14.2K; abs neutrophils 14.1K-->8.9; Hgb 13.1; Cr 0.69; K 3.7; ABG with ph 7.26/pCO2 99/pO2 76; albumin 3.8; A1c 8.1% at d/c. He presents to SNF for short term rehab.  Today he reports occasional HA. SOB improved. Tolerating tx. He reports past bad experiences with NH and would like his room door open and lights to stay on ATC. He declined to s/w psych this AM.   COPD/chronic respiratory failure s/p trach/OHS/OSA - followed by pulmonary. He does not require  O2 at this time. He gets albuterol nebs. He smokes cigs and was given nicotine cessation counseling in the hospital  CHF - followed by cardio. Currently on 2000cc fluid restriction. He takes lasix with KCL, coreg and ASA  DM - uncontrolled. A1c 8.1%. He takes metformin and januvia  Sacral decub ulcer - stage 4. Followed by facility wound care. He gets prostat per facility protocol and MVI  Constipation - stable on miralax  Hyperlipidemia - stable on lipitor  Morbid obesity - due to excess calories. BMI 63.53 kg/m2  Past Medical History:  Diagnosis Date  . CHF (congestive heart failure) [facility]) Dx Oct 2012  . Chronic ulcer of sacral region ([place]) 2012  . COPD (chronic obstructive pulmonary disease) ([place]) Dx 2013  . Diabetes mellitus without complication [facility]) Dx Nov 2015  . Hypertension Dx Oct 2012  . Sleep apnea Dx Oct 2011  . Ventilator dependent [facility])     Past Surgical History:  Procedure Laterality Date  . TRACHEOSTOMY TUBE PLACEMENT    . WOUND DEBRIDEMENT  12/21/2010   Procedure: DEBRIDEMENT WOUND;  Surgeon: Harl Bowie, MD;  Location: [facility];  Service: General;  Laterality: N/A;  Sacral    Patient Care Team: Boykin Nearing, MD as PCP - General (Family Medicine)  Social History   Social History  . Marital status: Single    Spouse name: N/A  . Number of children: N/A  . Years of education: N/A   Occupational History  . Disabled Unemployed   Social History  Main Topics  . Smoking status: Current Every Day Smoker    Types: Cigarettes  . Smokeless tobacco: Never Used  . Alcohol use No  . Drug use: Yes    Types: Marijuana  . Sexual activity: Yes    Partners: Male   Other Topics Concern  . Not on file   Social History Narrative   Single. Currently living in a condemned house.     reports that he has been smoking Cigarettes.  He has never used smokeless tobacco. He reports that he uses drugs, including Marijuana. He reports that he does not drink  alcohol.  Family History  Problem Relation Age of Onset  . Stomach cancer Brother   . Chronic Renal Failure Sister    Family Status  Relation Status  . Mother Alive  . Father Deceased  . Brother (Not Specified)  . Sister (Not Specified)    Immunization History  Administered Date(s) Administered  . Influenza,inj,Quad PF,36+ Mos 11/30/2013    Allergies  Allergen Reactions  . Tramadol Anaphylaxis, Swelling and Other (See Comments)    Reaction:  Tongue swelling   . Ibuprofen Swelling and Other (See Comments)    Reaction:  Tongue swelling   . Robaxin [Methocarbamol] Other (See Comments)    Reaction:  GI bleeding     Medications: Patient's Medications  New Prescriptions   No medications on file  Previous Medications   ALBUTEROL (PROVENTIL) (2.5 MG/3ML) 0.083% NEBULIZER SOLUTION    Take 3 mLs (2.5 mg total) by nebulization every 6 (six) hours as needed for wheezing or shortness of breath.   AMINO ACIDS-PROTEIN HYDROLYS (FEEDING SUPPLEMENT, PRO-STAT SUGAR FREE 64,) LIQD    Take 60 mLs by mouth 2 (two) times daily.   ASPIRIN EC 81 MG EC TABLET    Take 1 tablet (81 mg total) by mouth daily.   ATORVASTATIN (LIPITOR) 20 MG TABLET    Take 1 tablet (20 mg total) by mouth daily.   CARVEDILOL (COREG) 6.25 MG TABLET    Take 1 tablet (6.25 mg total) by mouth 2 (two) times daily with a meal.   FUROSEMIDE (LASIX) 40 MG TABLET    Take 2 tablets (80 mg total) by mouth 2 (two) times daily. Take 1 tab in the morning and 1 tab at 2 pm   METFORMIN (GLUCOPHAGE) 500 MG TABLET    Take 2 tablets (1,000 mg total) by mouth 2 (two) times daily with a meal.   MULTIPLE VITAMIN (MULTIVITAMIN WITH MINERALS) TABS TABLET    Take 1 tablet by mouth daily.   POLYETHYLENE GLYCOL (MIRALAX / GLYCOLAX) PACKET    Take 17 g by mouth 2 (two) times daily as needed for mild constipation. Reported on 08/03/2015   POTASSIUM CHLORIDE SA (K-DUR,KLOR-CON) 20 MEQ TABLET    Take 1 tablet (20 mEq total) by mouth daily.    SITAGLIPTIN (JANUVIA) 100 MG TABLET    Take 1 tablet (100 mg total) by mouth daily.  Modified Medications   No medications on file  Discontinued Medications   ACCU-CHEK FASTCLIX LANCETS MISC    1 each by Does not apply route 3 (three) times daily. E11.9   BLOOD GLUCOSE MONITORING SUPPL (ACCU-CHEK NANO SMARTVIEW) W/DEVICE KIT    1 Device by Does not apply route as needed. E 11.9    Review of Systems  Constitutional: Positive for fatigue.  Respiratory: Positive for shortness of breath.   Musculoskeletal: Positive for arthralgias and gait problem.  All other systems reviewed and are negative.  Vitals:   07/16/16 0944  BP: 115/85  Pulse: 92  Resp: (!) 24  Temp: 97.9 F (36.6 C)  TempSrc: Oral  SpO2: 91%  Weight: (!) 408 lb (185.1 kg)  Height: 5' 7"  (1.702 m)   Body mass index is 63.9 kg/m.  Physical Exam  Constitutional: He is oriented to person, place, and time. He appears well-developed and well-nourished.  Sitting on edge of bed in NAD, no conversational dyspnea  HENT:  Mouth/Throat: Oropharynx is clear and moist.  MMM; no oral thrush  Eyes: Pupils are equal, round, and reactive to light. No scleral icterus.  Neck: Neck supple. Carotid bruit is not present. No thyromegaly present.  Trach collar intact; no purulent d/c  Cardiovascular: Normal rate, regular rhythm and intact distal pulses.  Exam reveals no gallop and no friction rub.   Murmur (1/6 SEM) heard. +1 pitting LE edema b/l. No calf TTP  Pulmonary/Chest: Effort normal. He has decreased breath sounds (b/l at base). He has no wheezes. He has no rales. He exhibits no tenderness.  Abdominal: Soft. Normal appearance and bowel sounds are normal. He exhibits no distension, no abdominal bruit, no pulsatile midline mass and no mass. There is no hepatomegaly. There is no tenderness. There is no rigidity, no rebound and no guarding. No hernia.  Morbidly obese  Musculoskeletal: He exhibits edema.  Lymphadenopathy:    He has  no cervical adenopathy.  Neurological: He is alert and oriented to person, place, and time.  Skin: Skin is warm and dry. No rash noted.  Stage 4 sacral ulcer per d/c summary. Followed by facility wound care  Psychiatric: His behavior is normal. Judgment and thought content normal. His mood appears anxious.  Voice is soft     Labs reviewed: Admission on 06/27/2016, Discharged on 07/30/2016  No results displayed because visit has over 200 results.  CBC Latest Ref Rng & Units 08/02/2016 07/10/2016 07/09/2016  WBC 4.0 - 10.5 K/uL 14.2(H) 15.8(H) 14.8(H)  Hemoglobin 13.0 - 17.0 g/dL 13.1 13.7 14.3  Hematocrit 39.0 - 52.0 % 38.9(L) 40.9 41.0  Platelets 150 - 400 K/uL 242 245 234   CMP Latest Ref Rng & Units 07/21/2016 07/11/2016 07/10/2016  Glucose 65 - 99 mg/dL 211(H) 162(H) 188(H)  BUN 6 - 20 mg/dL 25(H) 23(H) 24(H)  Creatinine 0.61 - 1.24 mg/dL 0.69 0.68 0.72  Sodium 135 - 145 mmol/L 140 139 137  Potassium 3.5 - 5.1 mmol/L 3.7 4.4 3.6  Chloride 101 - 111 mmol/L 91(L) 90(L) 87(L)  CO2 22 - 32 mmol/L 39(H) 38(H) 40(H)  Calcium 8.9 - 10.3 mg/dL 9.3 9.4 9.4  Total Protein 6.5 - 8.1 g/dL - - -  Total Bilirubin 0.3 - 1.2 mg/dL - - -  Alkaline Phos 38 - 126 U/L - - -  AST 15 - 41 U/L - - -  ALT 17 - 63 U/L - - -   Lab Results  Component Value Date   HGBA1C 8.1 (H) 06/10/2016     Admission on 06/10/2016, Discharged on 06/21/2016  No results displayed because visit has over 200 results.      Ct Foot Left Wo Contrast  Result Date: 06/18/2016 CLINICAL DATA:  Lower extremity soft tissue swelling. Pain around the left great toe. No tenderness. EXAM: CT OF THE LEFT FOOT WITHOUT CONTRAST TECHNIQUE: Multidetector CT imaging of the left foot was performed according to the standard protocol. Multiplanar CT image reconstructions were also generated. COMPARISON:  None. FINDINGS: Bones/Joint/Cartilage No acute fracture or dislocation. Small  bony fragment adjacent to the medial malleolus likely reflecting  sequela of prior avulsive injury. Normal alignment. No joint effusion. Mild osteoarthritis of the tibiotalar joint. Mild osteoarthritis of the posterior subtalar joint. Mild osteoarthritis of the navicular- medial cuneiform joint. Moderate osteoarthritis of the first MTP joint. Small plantar calcaneal spur. Enthesopathic changes of the Achilles tendon insertion. Ligaments Ligaments are suboptimally evaluated by CT. Muscles and Tendons Muscles are normal. No muscle atrophy. Flexor, extensor, peroneal and Achilles tendons are grossly intact. Soft tissue No fluid collection or hematoma. No soft tissue mass. Generalized soft tissue edema and skin thickening of the distal left lower leg extending into the ankle and foot. IMPRESSION: 1.  No acute osseous injury of the left foot. 2. Generalized soft tissue edema and skin thickening of the distal left lower leg extending into the ankle and foot likely related to fluid overload chest history of CHF versus less likely cellulitis. 3. Moderate osteoarthritis of the first MTP joint. Electronically Signed   By: Kathreen Devoid   On: 06/18/2016 11:50   Dg Chest Port 1 View  Result Date: 06/30/2016 CLINICAL DATA:  Volume overload EXAM: PORTABLE CHEST 1 VIEW COMPARISON:  06/27/2016 FINDINGS: Tracheostomy in satisfactory position. Cardiomegaly with mild interstitial edema. Suspected small left pleural effusion. No pneumothorax. IMPRESSION: Cardiomegaly with mild interstitial edema. Suspected small left pleural effusion. Electronically Signed   By: Julian Hy M.D.   On: 06/30/2016 07:53   Dg Chest Port 1 View  Result Date: 06/27/2016 CLINICAL DATA:  Shortness of breath for 1 week EXAM: PORTABLE CHEST 1 VIEW COMPARISON:  06/10/2016 FINDINGS: Cardiomegaly with central vascular congestion and mild diffuse pulmonary edema. Atelectasis at the left base. No large effusion. IMPRESSION: Cardiomegaly with central vascular congestion and mild diffuse pulmonary edema Electronically  Signed   By: Donavan Foil M.D.   On: 06/27/2016 23:00     Assessment/Plan   ICD-10-CM   1. Chronic pain of multiple joints M25.50    G89.29    uncontrolled  2. Physical deconditioning R53.81   3. Chronic diastolic (congestive) heart failure (HCC) I50.32   4. Other emphysema ([place]) J43.8   5. Essential hypertension I10   6. Tracheostomy dependent ([place]) Z93.0   7. Severe obesity (BMI >= 40) (HCC) E66.01   8. Obesity hypoventilation syndrome (HCC) E66.2   9. Decubitus ulcer of sacral region, stage 4 ([place]) L89.154   10. Situational anxiety F41.8    with possible PTSD    He has a reported allergy to ibuprofen and tramadol. Pain uncontrolled with tylenol. Start norco 5/325 1 BID for pain. Hold for sedation. Taper off when pain better controlled  Cont other meds as ordered  Check CBC and BMP in 1 week  Daily weight and record for CHF  F/u with ENT for trach mx  F/u with cardio (CHF) and pulm (COPD) as scheduled  PT/OT/ST as ordered  Wound care as ordered  Trach care as indicated  Fluid restrict 1500 cc/day  Keep legs elevated when seated  Leave pt's door open and lights to remain lit 24hrs/day  GOAL: short term rehab and d/c to ALF when medically stable. Communicated with pt and nursing.  Will follow  Eduard Penkala S. Perlie Gold  [facility] [address] [place], [place] [address] [phone] Cell (Monday-Friday 8 AM - 5 PM) [phone] After 5 PM and follow prompts

## 2016-07-16 NOTE — Telephone Encounter (Signed)
Spoke with the pt  I offered sooner HFU with TP for 7/9 and he refused  He prefers to keep the scheduled appt 7/26  Nothing further needed

## 2016-07-17 ENCOUNTER — Inpatient Hospital Stay: Payer: Medicare Other

## 2016-07-23 DIAGNOSIS — Z430 Encounter for attention to tracheostomy: Secondary | ICD-10-CM | POA: Diagnosis not present

## 2016-07-23 DIAGNOSIS — Z6841 Body Mass Index (BMI) 40.0 and over, adult: Secondary | ICD-10-CM | POA: Diagnosis not present

## 2016-07-23 DIAGNOSIS — G4733 Obstructive sleep apnea (adult) (pediatric): Secondary | ICD-10-CM | POA: Diagnosis not present

## 2016-07-23 DIAGNOSIS — E662 Morbid (severe) obesity with alveolar hypoventilation: Secondary | ICD-10-CM | POA: Diagnosis not present

## 2016-07-24 DIAGNOSIS — E8770 Fluid overload, unspecified: Secondary | ICD-10-CM | POA: Diagnosis not present

## 2016-07-24 DIAGNOSIS — R0602 Shortness of breath: Secondary | ICD-10-CM | POA: Diagnosis not present

## 2016-07-24 DIAGNOSIS — Z930 Tracheostomy status: Secondary | ICD-10-CM | POA: Diagnosis not present

## 2016-07-25 DIAGNOSIS — M6281 Muscle weakness (generalized): Secondary | ICD-10-CM | POA: Diagnosis not present

## 2016-07-25 DIAGNOSIS — I517 Cardiomegaly: Secondary | ICD-10-CM | POA: Diagnosis not present

## 2016-07-25 DIAGNOSIS — Z930 Tracheostomy status: Secondary | ICD-10-CM | POA: Diagnosis not present

## 2016-07-25 DIAGNOSIS — I5032 Chronic diastolic (congestive) heart failure: Secondary | ICD-10-CM | POA: Diagnosis not present

## 2016-07-25 DIAGNOSIS — G4733 Obstructive sleep apnea (adult) (pediatric): Secondary | ICD-10-CM | POA: Diagnosis not present

## 2016-08-04 DIAGNOSIS — 419620001 Death: Secondary | SNOMED CT | POA: Diagnosis not present

## 2016-08-04 DEATH — deceased

## 2016-08-16 DIAGNOSIS — L89154 Pressure ulcer of sacral region, stage 4: Secondary | ICD-10-CM | POA: Diagnosis not present

## 2016-08-16 DIAGNOSIS — M79672 Pain in left foot: Secondary | ICD-10-CM | POA: Diagnosis not present

## 2016-08-28 NOTE — Progress Notes (Deleted)
Subjective:    Patient ID: [name], male    DOB: 05-14-1965, 51 y.o.   MRN: [id number]  HPI   Review of Systems A pertinent 14 point review of systems is negative except as per the history of presenting illness.  Allergies  Allergen Reactions  . Tramadol Anaphylaxis, Swelling and Other (See Comments)    Reaction:  Tongue swelling   . Ibuprofen Swelling and Other (See Comments)    Reaction:  Tongue swelling   . Robaxin [Methocarbamol] Other (See Comments)    Reaction:  GI bleeding     Current Outpatient Prescriptions on File Prior to Visit  Medication Sig Dispense Refill  . albuterol (PROVENTIL) (2.5 MG/3ML) 0.083% nebulizer solution Take 3 mLs (2.5 mg total) by nebulization every 6 (six) hours as needed for wheezing or shortness of breath. 75 mL 5  . Amino Acids-Protein Hydrolys (FEEDING SUPPLEMENT, PRO-STAT SUGAR FREE 64,) LIQD Take 60 mLs by mouth 2 (two) times daily. 900 mL 0  . aspirin EC 81 MG EC tablet Take 1 tablet (81 mg total) by mouth daily.    Mar[place] Kitchen atorvastatin (LIPITOR) 20 MG tablet Take 1 tablet (20 mg total) by mouth daily. 30 tablet 11  . carvedilol (COREG) 6.25 MG tablet Take 1 tablet (6.25 mg total) by mouth 2 (two) times daily with a meal. 60 tablet 2  . furosemide (LASIX) 40 MG tablet Take 2 tablets (80 mg total) by mouth 2 (two) times daily. Take 1 tab in the morning and 1 tab at 2 pm 90 tablet 2  . metFORMIN (GLUCOPHAGE) 500 MG tablet Take 2 tablets (1,000 mg total) by mouth 2 (two) times daily with a meal. 60 tablet 11  . Multiple Vitamin (MULTIVITAMIN WITH MINERALS) TABS tablet Take 1 tablet by mouth daily.    . polyethylene glycol (MIRALAX / GLYCOLAX) packet Take 17 g by mouth 2 (two) times daily as needed for mild constipation. Reported on 08/03/2015    . potassium chloride SA (K-DUR,KLOR-CON) 20 MEQ tablet Take 1 tablet (20 mEq total) by mouth daily. 90 tablet 3  . sitaGLIPtin (JANUVIA) 100 MG tablet Take 1 tablet (100 mg total) by mouth daily. 30 tablet  2   No current facility-administered medications on file prior to visit.     Past Medical History:  Diagnosis Date  . CHF (congestive heart failure) [facility]) Dx Oct 2012  . Chronic ulcer of sacral region ([place]) 2012  . COPD (chronic obstructive pulmonary disease) ([place]) Dx 2013  . Diabetes mellitus without complication [facility]) Dx Nov 2015  . Hypertension Dx Oct 2012  . Sleep apnea Dx Oct 2011  . Ventilator dependent [facility])     Past Surgical History:  Procedure Laterality Date  . TRACHEOSTOMY TUBE PLACEMENT    . WOUND DEBRIDEMENT  12/21/2010   Procedure: DEBRIDEMENT WOUND;  Surgeon: Harl Bowie, MD;  Location: [facility];  Service: General;  Laterality: N/A;  Sacral    Family History  Problem Relation Age of Onset  . Stomach cancer Brother   . Chronic Renal Failure Sister     Social History   Social History  . Marital status: Single    Spouse name: N/A  . Number of children: N/A  . Years of education: N/A   Occupational History  . Disabled Unemployed   Social History Main Topics  . Smoking status: Current Every Day Smoker    Types: Cigarettes  . Smokeless tobacco: Never Used  . Alcohol use No  . Drug  use: Yes    Types: Marijuana  . Sexual activity: Yes    Partners: Male   Other Topics Concern  . Not on file   Social History Narrative   Single. Currently living in a condemned house.      Objective:   Physical Exam There were no vitals taken for this visit. General:  Awake. Alert. No acute distress. Sitting watching TV. Family at bedside.  Integument:  Warm & dry. No rash on exposed skin. No bruising. Extremities:  No cyanosis or clubbing.  Lymphatics:  No appreciated cervical or supraclavicular lymphadenoapthy. HEENT:  Moist mucus membranes. No oral ulcers. No scleral injection or icterus. Endotracheal tube in place. PERRL. Cardiovascular:  Regular rate. No edema. No appreciable JVD.  Pulmonary:  Good aeration & clear to auscultation bilaterally. Symmetric  chest wall expansion. No accessory muscle use. Abdomen: Soft. Normal bowel sounds. Nondistended. Grossly nontender. Musculoskeletal:  Normal bulk and tone. Hand grip strength 5/5 bilaterally. No joint deformity or effusion appreciated. Neurological:  CN 2-12 grossly in tact. No meningismus. Moving all 4 extremities equally. Symmetric brachioradialis deep tendon reflexes. Psychiatric:  Mood and affect congruent. Speech normal rhythm, rate & tone.   IMAGING PORT CXR 06/30/16 (personally reviewed by me):  Blunting of left costal cardiac and costophrenic angle suggestive of pleural effusion. Low lung volumes. Increased interstitial markings bilaterally with some patchy alveolar infiltrate suggestive of pulmonary edema. Suggestion of cardiomegaly. Mediastinum normal in contour.  CARDIAC TTE (06/11/16):  LV normal in size with moderate LVH. EF 60-65%. No regional wall motion abnormalities & normal diastolic function. LA & RA normal in size. RV normal in size and function. No aortic stenosis or regurgitation. Aortic root normal in size. No mitral stenosis or regurgitation. No significant pulmonic regurgitation. No significant tricuspid regurgitation. No pericardial effusion.  MICROBIOLOGY Tracheal Aspirate Culture 12/18/10:  Pseudomonas aeruginosa (pan-sensitive)  LABS 12/07/10 ANA:  1:40 RF:  <10    Assessment & Plan:  51 y.o.  Creig Hines E. Ashok Cordia, M.D. [facility] Pager:  [phone] After 3pm or if no response, call [phone] 5:42 PM 08/28/16

## 2016-08-29 ENCOUNTER — Institutional Professional Consult (permissible substitution): Payer: Medicare Other | Admitting: Pulmonary Disease

## 2016-09-04 DIAGNOSIS — 419620001 Death: Secondary | SNOMED CT | POA: Diagnosis not present

## 2016-09-04 DEATH — deceased

## 2020-03-24 ENCOUNTER — Encounter: Payer: Self-pay | Admitting: Internal Medicine
